# Patient Record
Sex: Female | Born: 1960
Health system: Southern US, Community
[De-identification: ages and names within clinical notes are randomized; demographics above are authoritative.]

## PROBLEM LIST (undated history)

## (undated) DIAGNOSIS — E559 Vitamin D deficiency, unspecified: Secondary | ICD-10-CM

## (undated) DIAGNOSIS — H33319 Horseshoe tear of retina without detachment, unspecified eye: Secondary | ICD-10-CM

## (undated) DIAGNOSIS — R002 Palpitations: Secondary | ICD-10-CM

## (undated) DIAGNOSIS — L709 Acne, unspecified: Secondary | ICD-10-CM

## (undated) DIAGNOSIS — R0602 Shortness of breath: Secondary | ICD-10-CM

## (undated) DIAGNOSIS — M7550 Bursitis of unspecified shoulder: Secondary | ICD-10-CM

## (undated) DIAGNOSIS — I499 Cardiac arrhythmia, unspecified: Secondary | ICD-10-CM

## (undated) DIAGNOSIS — J329 Chronic sinusitis, unspecified: Secondary | ICD-10-CM

## (undated) DIAGNOSIS — M2550 Pain in unspecified joint: Secondary | ICD-10-CM

## (undated) DIAGNOSIS — K219 Gastro-esophageal reflux disease without esophagitis: Secondary | ICD-10-CM

## (undated) HISTORY — PX: ABDOMINAL SURGERY: SHX537

## (undated) HISTORY — DX: Pain in unspecified joint: M25.50

## (undated) HISTORY — DX: Shortness of breath: R06.02

## (undated) HISTORY — PX: DILATION AND CURETTAGE OF UTERUS: SHX78

## (undated) HISTORY — DX: Palpitations: R00.2

## (undated) HISTORY — DX: Gastro-esophageal reflux disease without esophagitis: K21.9

## (undated) HISTORY — PX: TONSILLECTOMY: SUR1361

## (undated) HISTORY — DX: Vitamin D deficiency, unspecified: E55.9

## (undated) HISTORY — PX: ABDOMINAL SACROCOLPOPEXY: SHX1114

## (undated) HISTORY — PX: ABDOMINAL HYSTERECTOMY: SHX81

---

## 1991-10-30 DIAGNOSIS — E061 Subacute thyroiditis: Secondary | ICD-10-CM

## 1991-10-30 HISTORY — DX: Subacute thyroiditis: E06.1

## 1999-01-16 ENCOUNTER — Ambulatory Visit (HOSPITAL_COMMUNITY): Admission: RE | Admit: 1999-01-16 | Discharge: 1999-01-16 | Payer: Self-pay | Admitting: Gynecology

## 1999-01-16 ENCOUNTER — Encounter: Payer: Self-pay | Admitting: Gynecology

## 1999-04-27 ENCOUNTER — Encounter (INDEPENDENT_AMBULATORY_CARE_PROVIDER_SITE_OTHER): Payer: Self-pay | Admitting: Specialist

## 1999-04-27 ENCOUNTER — Other Ambulatory Visit: Admission: RE | Admit: 1999-04-27 | Discharge: 1999-04-27 | Payer: Self-pay | Admitting: Gynecology

## 1999-12-25 ENCOUNTER — Other Ambulatory Visit: Admission: RE | Admit: 1999-12-25 | Discharge: 1999-12-25 | Payer: Self-pay | Admitting: Gynecology

## 2000-12-16 ENCOUNTER — Other Ambulatory Visit: Admission: RE | Admit: 2000-12-16 | Discharge: 2000-12-16 | Payer: Self-pay | Admitting: Gynecology

## 2002-01-07 ENCOUNTER — Other Ambulatory Visit: Admission: RE | Admit: 2002-01-07 | Discharge: 2002-01-07 | Payer: Self-pay | Admitting: Gynecology

## 2002-02-04 ENCOUNTER — Encounter: Payer: Self-pay | Admitting: Gynecology

## 2002-02-04 ENCOUNTER — Ambulatory Visit (HOSPITAL_COMMUNITY): Admission: RE | Admit: 2002-02-04 | Discharge: 2002-02-04 | Payer: Self-pay | Admitting: Gynecology

## 2002-05-20 ENCOUNTER — Encounter: Payer: Self-pay | Admitting: Gynecology

## 2002-05-20 ENCOUNTER — Encounter: Admission: RE | Admit: 2002-05-20 | Discharge: 2002-05-20 | Payer: Self-pay | Admitting: Gynecology

## 2003-01-12 ENCOUNTER — Other Ambulatory Visit: Admission: RE | Admit: 2003-01-12 | Discharge: 2003-01-12 | Payer: Self-pay | Admitting: Gynecology

## 2003-05-26 ENCOUNTER — Encounter: Payer: Self-pay | Admitting: Gynecology

## 2003-05-26 ENCOUNTER — Encounter: Admission: RE | Admit: 2003-05-26 | Discharge: 2003-05-26 | Payer: Self-pay | Admitting: Gynecology

## 2004-02-15 ENCOUNTER — Other Ambulatory Visit: Admission: RE | Admit: 2004-02-15 | Discharge: 2004-02-15 | Payer: Self-pay | Admitting: Gynecology

## 2004-06-08 ENCOUNTER — Encounter: Admission: RE | Admit: 2004-06-08 | Discharge: 2004-06-08 | Payer: Self-pay | Admitting: Gynecology

## 2005-02-21 ENCOUNTER — Other Ambulatory Visit: Admission: RE | Admit: 2005-02-21 | Discharge: 2005-02-21 | Payer: Self-pay | Admitting: Gynecology

## 2005-05-14 ENCOUNTER — Ambulatory Visit: Payer: Self-pay | Admitting: Oncology

## 2005-06-11 ENCOUNTER — Encounter: Admission: RE | Admit: 2005-06-11 | Discharge: 2005-06-11 | Payer: Self-pay | Admitting: Gynecology

## 2005-06-21 ENCOUNTER — Encounter: Admission: RE | Admit: 2005-06-21 | Discharge: 2005-06-21 | Payer: Self-pay | Admitting: Gynecology

## 2006-03-04 ENCOUNTER — Other Ambulatory Visit: Admission: RE | Admit: 2006-03-04 | Discharge: 2006-03-04 | Payer: Self-pay | Admitting: Gynecology

## 2006-05-25 ENCOUNTER — Emergency Department (HOSPITAL_COMMUNITY): Admission: EM | Admit: 2006-05-25 | Discharge: 2006-05-25 | Payer: Self-pay | Admitting: Emergency Medicine

## 2006-06-17 ENCOUNTER — Encounter: Admission: RE | Admit: 2006-06-17 | Discharge: 2006-06-17 | Payer: Self-pay | Admitting: Gynecology

## 2006-08-21 ENCOUNTER — Ambulatory Visit: Payer: Self-pay | Admitting: Cardiology

## 2006-09-18 ENCOUNTER — Ambulatory Visit: Payer: Self-pay | Admitting: Cardiology

## 2006-09-18 ENCOUNTER — Encounter: Payer: Self-pay | Admitting: Cardiology

## 2006-09-18 ENCOUNTER — Ambulatory Visit: Payer: Self-pay

## 2007-03-12 ENCOUNTER — Other Ambulatory Visit: Admission: RE | Admit: 2007-03-12 | Discharge: 2007-03-12 | Payer: Self-pay | Admitting: Gynecology

## 2007-06-27 ENCOUNTER — Encounter: Admission: RE | Admit: 2007-06-27 | Discharge: 2007-06-27 | Payer: Self-pay | Admitting: Gynecology

## 2008-03-24 ENCOUNTER — Other Ambulatory Visit: Admission: RE | Admit: 2008-03-24 | Discharge: 2008-03-24 | Payer: Self-pay | Admitting: Gynecology

## 2008-07-02 ENCOUNTER — Encounter: Admission: RE | Admit: 2008-07-02 | Discharge: 2008-07-02 | Payer: Self-pay | Admitting: Gynecology

## 2008-10-15 ENCOUNTER — Encounter: Admission: RE | Admit: 2008-10-15 | Discharge: 2008-10-15 | Payer: Self-pay | Admitting: Gynecology

## 2008-11-06 ENCOUNTER — Ambulatory Visit: Payer: Self-pay | Admitting: Family Medicine

## 2008-11-06 DIAGNOSIS — J0101 Acute recurrent maxillary sinusitis: Secondary | ICD-10-CM | POA: Insufficient documentation

## 2008-11-06 DIAGNOSIS — J0100 Acute maxillary sinusitis, unspecified: Secondary | ICD-10-CM | POA: Insufficient documentation

## 2008-11-06 DIAGNOSIS — J069 Acute upper respiratory infection, unspecified: Secondary | ICD-10-CM | POA: Insufficient documentation

## 2009-04-10 ENCOUNTER — Ambulatory Visit: Payer: Self-pay | Admitting: Family Medicine

## 2009-04-10 DIAGNOSIS — S93409A Sprain of unspecified ligament of unspecified ankle, initial encounter: Secondary | ICD-10-CM | POA: Insufficient documentation

## 2009-07-13 ENCOUNTER — Encounter: Admission: RE | Admit: 2009-07-13 | Discharge: 2009-07-13 | Payer: Self-pay | Admitting: Gynecology

## 2009-09-13 ENCOUNTER — Encounter (INDEPENDENT_AMBULATORY_CARE_PROVIDER_SITE_OTHER): Payer: Self-pay | Admitting: Obstetrics and Gynecology

## 2009-09-13 ENCOUNTER — Ambulatory Visit (HOSPITAL_COMMUNITY): Admission: RE | Admit: 2009-09-13 | Discharge: 2009-09-13 | Payer: Self-pay | Admitting: Obstetrics and Gynecology

## 2010-03-14 ENCOUNTER — Ambulatory Visit (HOSPITAL_COMMUNITY): Admission: RE | Admit: 2010-03-14 | Discharge: 2010-03-15 | Payer: Self-pay | Admitting: Obstetrics and Gynecology

## 2010-03-14 ENCOUNTER — Encounter (INDEPENDENT_AMBULATORY_CARE_PROVIDER_SITE_OTHER): Payer: Self-pay | Admitting: Obstetrics and Gynecology

## 2010-03-14 DIAGNOSIS — Z9079 Acquired absence of other genital organ(s): Secondary | ICD-10-CM | POA: Insufficient documentation

## 2010-04-10 ENCOUNTER — Ambulatory Visit (HOSPITAL_COMMUNITY): Admission: RE | Admit: 2010-04-10 | Discharge: 2010-04-10 | Payer: Self-pay | Admitting: Obstetrics and Gynecology

## 2010-04-21 ENCOUNTER — Ambulatory Visit (HOSPITAL_COMMUNITY): Admission: RE | Admit: 2010-04-21 | Discharge: 2010-04-21 | Payer: Self-pay | Admitting: Orthopedic Surgery

## 2010-07-19 ENCOUNTER — Encounter: Admission: RE | Admit: 2010-07-19 | Discharge: 2010-07-19 | Payer: Self-pay | Admitting: Gynecology

## 2010-10-02 ENCOUNTER — Encounter: Admission: RE | Admit: 2010-10-02 | Discharge: 2010-10-02 | Payer: Self-pay | Admitting: Gynecology

## 2010-11-19 ENCOUNTER — Encounter: Payer: Self-pay | Admitting: Orthopedic Surgery

## 2011-01-15 LAB — CBC
Hemoglobin: 11.1 g/dL — ABNORMAL LOW (ref 12.0–15.0)
MCV: 95.8 fL (ref 78.0–100.0)
Platelets: 194 10*3/uL (ref 150–400)
RBC: 3.35 MIL/uL — ABNORMAL LOW (ref 3.87–5.11)
WBC: 12.7 10*3/uL — ABNORMAL HIGH (ref 4.0–10.5)

## 2011-01-16 LAB — CBC
Hemoglobin: 13.4 g/dL (ref 12.0–15.0)
WBC: 6.2 10*3/uL (ref 4.0–10.5)

## 2011-01-16 LAB — DIFFERENTIAL
Basophils Relative: 1 % (ref 0–1)
Eosinophils Relative: 2 % (ref 0–5)
Monocytes Absolute: 0.6 10*3/uL (ref 0.1–1.0)
Monocytes Relative: 10 % (ref 3–12)
Neutro Abs: 3.6 10*3/uL (ref 1.7–7.7)
Neutrophils Relative %: 59 % (ref 43–77)

## 2011-01-16 LAB — COMPREHENSIVE METABOLIC PANEL
ALT: 15 U/L (ref 0–35)
AST: 18 U/L (ref 0–37)
Calcium: 9.1 mg/dL (ref 8.4–10.5)
Creatinine, Ser: 0.7 mg/dL (ref 0.4–1.2)
Glucose, Bld: 86 mg/dL (ref 70–99)
Sodium: 138 mEq/L (ref 135–145)
Total Bilirubin: 0.6 mg/dL (ref 0.3–1.2)
Total Protein: 7.1 g/dL (ref 6.0–8.3)

## 2011-01-31 LAB — COMPREHENSIVE METABOLIC PANEL
ALT: 15 U/L (ref 0–35)
Calcium: 9.2 mg/dL (ref 8.4–10.5)
Chloride: 106 mEq/L (ref 96–112)
GFR calc Af Amer: >60 mL/min (ref >60)
Total Bilirubin: 0.6 mg/dL (ref 0.3–1.2)
Total Protein: 7.4 g/dL (ref 6.0–8.3)

## 2011-01-31 LAB — CBC
Hemoglobin: 13.6 g/dL (ref 12.0–15.0)
MCHC: 33.8 g/dL (ref 30.0–36.0)
MCV: 95.8 fL (ref 78.0–100.0)
Platelets: 262 10*3/uL (ref 150–400)
RBC: 4.2 MIL/uL (ref 3.87–5.11)
WBC: 5.4 10*3/uL (ref 4.0–10.5)

## 2011-03-16 NOTE — Procedures (Signed)
[facility]                                EXERCISE TREADMILL   Danielle Braun, Danielle Braun                       MRN:          [id number]  DATE:09/18/2006                            DOB:          1961/04/04    PRIMARY CARE PHYSICIAN:  Sid Falcon, M.D.   PROCEDURE:  Exercise treadmill test.   INDICATION:  Evaluate patient with tachycardia and palpitations.   DESCRIPTION OF PROCEDURE:  The patient exercised using standard Bruce  protocol. She was able to exercise for 9 minutes. This was to a heart rate  of 184 which was 104% of predicted. She achieved 10.1 METS. She had an  appropriately blood pressure response of 187/88 peak at exercise. She had no  chest pain. There was no ectopy during this procedure. There were no  ischemic ST, T wave changes. She had normal heart rate recovery.   CONCLUSION:  Negative adequate exercise treadmill test. She did not have any  dysrhythmias with this. Preliminary echo demonstrates EF to be 60% with no  abnormalities. Given this she is at low risk for sustained dangerous  ventricular arrhythmias. No further cardiovascular testing is suggested. We  did discuss and regimen based on the above testing.   FOLLOW UP:  She can come back to this clinic as needed.     Rollene Rotunda, MD, [facility]  Electronically Signed    JH/MedQ  DD: 09/18/2006  DT: 09/18/2006  Job #: [id number]   cc:   Sid Falcon, M.D.

## 2011-03-16 NOTE — Assessment & Plan Note (Signed)
[facility]                              CARDIOLOGY OFFICE NOTE   Danielle, Braun                       MRN:          [id number]  DATE:08/21/2006                            DOB:          [birth year]    REASON FOR PRESENTATION:  Evaluate the patient for tachycardia.   HISTORY OF PRESENT ILLNESS:  The patient is a lovely 50 year old white  female who had a history of premature ventricular contractions documented 14  years ago.  At that time she was found to have subacute thyroiditis.  She  also became pregnant shortly thereafter.  She did have an echocardiogram  that was apparently normal, but needed no further workup.  In March of this  year she had an episode of near-syncope while at work, seated.  She did not  feel any tachy arrhythmias at that time.  However, for about 10 seconds, she  felt things going gray and thought that, if it continued, she would have  passed out.  However, it stopped.  She has not had any further episodes of  that.  However, since August, perhaps 1 time per week, particularly while  bending or squatting she feels her heart skipping.  She did notice this  during the examination as well.  She has otherwise been very active.  She  exercises routinely.  She does not have any chest discomfort, neck  discomfort, arm discomfort, activity-induced nausea or vomiting, excessive  diaphoresis.  She has had no palpitations, pre-syncope, or syncope.  She  denies any PND or orthopnea.   She did wear a Holter monitor.  This demonstrated sinus rhythm or sinus  tachycardia.  She had occasional premature atrial contractions.  She did  have a non-sustained tachy arhythmia of 15 beats.  This was a different  morphology than the PACs.  It was not particularly wide.  However, it may  have been ventricular tachycardia.  She actually did not have any symptoms  related to this.   PAST MEDICAL HISTORY:  Subacute thyroiditis.   PAST SURGICAL  HISTORY:  Tonsillectomy.   ALLERGIES:  NAPROSYN.   MEDICATIONS:  1. Provera 5 mg q. day.  2. Multivitamin.  3. Calcium.  4. Flonase.  5. Benadryl.  6. Topical agents for acne.   SOCIAL HISTORY:  The patient is a Consulting civil engineer.  She exercises.  She is married.  She has 2 children.   FAMILY HISTORY:  Contributory for her maternal grandfather dying suddenly.  However, there is no other history consistent with sudden cardiac death,  cardiomyopathy, syncope, arrhythmias, or early coronary disease.   REVIEW OF SYSTEMS:  Positive as stated in the HPI and positive for headaches  and occasional swelling in her legs, left leg phlebitis in 1999, rhinitis.  Negative for other systems.   PHYSICAL EXAMINATION:  The patient is in no distress.  Blood pressure 113/70, heart rate 84 and regular.  Weight 155 pounds.  Body  mass index 24.  HEENT:  Eyelids unremarkable.  Pupils equal, round, and reactive to light.  Fundi not visualized.  Oral mucosa unremarkable.  NECK:  No jugular venous distension.  Wave form within normal limits.  Carotid upstroke brisk, symmetric.  No bruits, thyromegaly.  LYMPHATICS:  No cervical, axillary, or inguinal adenopathy.  LUNGS:  Clear to auscultation bilaterally.  BACK:  No costovertebral angle tenderness.  CHEST:  Unremarkable.  HEART:  PMI not displaced or sustained.  S1 and S2 within normal limits.  No  S3, no S4.  No murmurs.  No clicks.  No rubs.  ABDOMEN:  Flat.  Positive bowel sounds.  Normal in frequency and pitch.  No  bruits, rebound, or guarding.  No midline pulsatile masses.  No  hepatomegaly.  No splenomegaly.  SKIN:  No rashes.  No nodules.  EXTREMITIES:  With 2+ pulses throughout.  No edema.  No cyanosis, no  clubbing.  NEURO:  Oriented to person, place, and time.  Cranial nerves 2-12 grossly  intact.  Motor grossly intact.   EKG:  Sinus rhythm, rate 84, axis within normal limits, intervals within  normal limits, no acute ST-T wave  changes.   ASSESSMENT AND PLAN:  1. Palpitations.  The patient has palpitations that are her premature      atrial contractions.  These are her most symptomatic complaints.  At      this point, we talked about these and we will manage these      conservatively for now.  She is going to avoid all caffeine and see if      this improves.  If not, we might consider calcium channel blocker.  2. Nonsustained tachy arrhythmia.  This may have been ventricular      tachycardia.  However, she did not have any symptoms this time related      to this.  I would suggest that this may have been the episode that she      felt in March, but have no proof of this.  For now I am going to start      with an echocardiogram.  I will also bring her back for an exercise      treadmill test.    ______________________________  Rollene Rotunda, MD, [facility]    JH/MedQ  DD: 08/21/2006  DT: 08/22/2006  Job #: [id number]   cc:   Dr Sid Falcon

## 2011-06-26 ENCOUNTER — Other Ambulatory Visit: Payer: Self-pay | Admitting: Gynecology

## 2011-06-26 DIAGNOSIS — Z1231 Encounter for screening mammogram for malignant neoplasm of breast: Secondary | ICD-10-CM

## 2011-07-27 ENCOUNTER — Ambulatory Visit
Admission: RE | Admit: 2011-07-27 | Discharge: 2011-07-27 | Disposition: A | Discharge status: Home / Self Care | Payer: 59 | Source: Ambulatory Visit | Attending: Gynecology | Admitting: Gynecology

## 2011-07-27 DIAGNOSIS — Z1231 Encounter for screening mammogram for malignant neoplasm of breast: Secondary | ICD-10-CM

## 2012-01-09 ENCOUNTER — Encounter: Payer: Self-pay | Admitting: *Deleted

## 2012-01-09 ENCOUNTER — Emergency Department
Admission: EM | Admit: 2012-01-09 | Discharge: 2012-01-09 | Disposition: A | Discharge status: Home Health | Payer: 59 | Source: Home / Self Care | Attending: Emergency Medicine | Admitting: Emergency Medicine

## 2012-01-09 ENCOUNTER — Emergency Department: Admit: 2012-01-09 | Discharge: 2012-01-09 | Disposition: A | Discharge status: Home / Self Care | Payer: 59

## 2012-01-09 DIAGNOSIS — M542 Cervicalgia: Secondary | ICD-10-CM

## 2012-01-09 HISTORY — DX: Acne, unspecified: L70.9

## 2012-01-09 MED ORDER — CYCLOBENZAPRINE HCL 10 MG PO TABS: 10.0000 mg | ORAL_TABLET | Freq: Two times a day (BID) | ORAL | Status: AC | PRN

## 2012-01-09 MED ORDER — MELOXICAM 7.5 MG PO TABS: 7.5000 mg | ORAL_TABLET | Freq: Two times a day (BID) | ORAL | Status: AC | PRN
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
## 2012-06-26 ENCOUNTER — Other Ambulatory Visit: Payer: Self-pay | Admitting: Gynecology

## 2012-06-26 DIAGNOSIS — Z1231 Encounter for screening mammogram for malignant neoplasm of breast: Secondary | ICD-10-CM

## 2012-07-29 ENCOUNTER — Ambulatory Visit
Admission: RE | Admit: 2012-07-29 | Discharge: 2012-07-29 | Disposition: A | Discharge status: Home / Self Care | Payer: 59 | Source: Ambulatory Visit | Attending: Gynecology | Admitting: Gynecology

## 2012-07-29 DIAGNOSIS — Z1231 Encounter for screening mammogram for malignant neoplasm of breast: Secondary | ICD-10-CM

## 2012-08-20 ENCOUNTER — Other Ambulatory Visit: Payer: Self-pay | Admitting: Dermatology

## 2012-11-18 ENCOUNTER — Other Ambulatory Visit: Payer: Self-pay | Admitting: Gastroenterology

## 2013-05-02 ENCOUNTER — Emergency Department
Admission: EM | Admit: 2013-05-02 | Discharge: 2013-05-02 | Disposition: A | Discharge status: Home / Self Care | Payer: 59 | Source: Home / Self Care | Attending: Family Medicine | Admitting: Family Medicine

## 2013-05-02 DIAGNOSIS — B029 Zoster without complications: Secondary | ICD-10-CM

## 2013-05-02 MED ORDER — VALACYCLOVIR HCL 1 G PO TABS: 1000.0000 mg | ORAL_TABLET | Freq: Three times a day (TID) | ORAL | Status: DC

## 2013-05-02 NOTE — ED Notes (Signed)
Danielle Braun complains of a rash on her face for a couple of days. She also complains of pain and rash on her upper back for 1 day. The pain is a 7/10 starting off as sharp and ending up a burning pain. She has tried Aleve and Ibuprofen without relief.

## 2013-05-02 NOTE — ED Provider Notes (Signed)
History    CSN: [id number] Arrival date & time 05/02/13  0927  First MD Initiated Contact with Patient 05/02/13 1009     Chief Complaint  Patient presents with  . Rash    x couple days  . Back Pain    yesterday      HPI Comments: Patient noticed a slight rash on her right upper arm about 5 days ago, and then some on her right cheek about 2 to 3 days ago with a burning sensation.  Yesterday she developed a sharp pain in her right upper back radiating to her right shoulder.  She feels well otherwise.  No known insect bites.  Patient is a 52 y.o. female presenting with rash. The history is provided by the patient.  Rash Pain location: right upper back. Pain quality: burning   Pain radiates to:  R shoulder Pain severity:  Mild Onset quality:  Gradual Duration:  5 days Timing:  Constant Progression:  Worsening Chronicity:  New Relieved by:  Nothing Worsened by:  Nothing tried Ineffective treatments:  NSAIDs Associated symptoms: no anorexia, no chest pain, no chills, no cough, no fatigue, no fever and no nausea    Past Medical History  Diagnosis Date  . Acne   . Endometriosis    Past Surgical History  Procedure Laterality Date  . Tonsillectomy    . Abdominal hysterectomy    . Abdominal surgery      csection   Family History  Problem Relation Age of Onset  . Hyperlipidemia Mother   . Cancer Father     bladder  . Diabetes Brother    History  Substance Use Topics  . Smoking status: Former Games developer  . Smokeless tobacco: Not on file  . Alcohol Use: Yes   OB History   Grav Para Term Preterm Abortions TAB SAB Ect Mult Living                 Review of Systems  Constitutional: Negative for fever, chills and fatigue.  Respiratory: Negative for cough.   Cardiovascular: Negative for chest pain.  Gastrointestinal: Negative for nausea and anorexia.  Skin: Positive for rash.  All other systems reviewed and are negative.    Allergies  Review of patient's allergies  indicates no known allergies.  Home Medications   Current Outpatient Rx  Name  Route  Sig  Dispense  Refill  . calcium carbonate (OS-CAL) 600 MG TABS   Oral   Take 600 mg by mouth 2 (two) times daily with a meal.         . ranitidine (ZANTAC) 150 MG capsule   Oral   Take 150 mg by mouth 2 (two) times daily.         . fluticasone (FLONASE) 50 MCG/ACT nasal spray   Nasal   Place 2 sprays into the nose daily.         . minocycline (MINOCIN,DYNACIN) 100 MG capsule   Oral   Take 100 mg by mouth 2 (two) times daily.         . valACYclovir (VALTREX) 1000 MG tablet   Oral   Take 1 tablet (1,000 mg total) by mouth 3 (three) times daily.   21 tablet   0    BP 99/69  Pulse 75  Temp(Src) 98.2 F (36.8 C) (Oral)  Ht 5\' 6"  (1.676 m)  Wt 170 lb (77.111 kg)  BMI 27.45 kg/m2  SpO2 99% Physical Exam  Nursing note and vitals reviewed. Constitutional: She  is oriented to person, place, and time. She appears well-developed and well-nourished. No distress.  HENT:  Head: Normocephalic and atraumatic.  Nose: Nose normal.  Mouth/Throat: Oropharynx is clear and moist.  Eyes: Conjunctivae and EOM are normal. Pupils are equal, round, and reactive to light. Right eye exhibits no discharge. Left eye exhibits no discharge.  Neck: Neck supple.  Cardiovascular: Normal heart sounds.   Pulmonary/Chest: Breath sounds normal.  Abdominal: There is no tenderness.  Lymphadenopathy:    She has no cervical adenopathy.  Neurological: She is alert and oriented to person, place, and time.  Skin: Skin is warm and dry. Rash noted. Rash is pustular.     In distribution indicated, there are several individual pustular lesions.  No lesions on nose.    ED Course  Procedures  none    1. Herpes zoster     MDM  Begin Valtrex If lesions affect tip of nose, recommend evaluation by ophthalmologist May take Tylenol or Ibuprofen for pain. Followup with Family Doctor if not improved in one week.    Lattie Haw, MD 05/07/13 [phone]

## 2013-05-07 ENCOUNTER — Telehealth: Payer: Self-pay | Admitting: Emergency Medicine

## 2013-07-03 ENCOUNTER — Other Ambulatory Visit: Payer: Self-pay

## 2013-07-03 DIAGNOSIS — Z1231 Encounter for screening mammogram for malignant neoplasm of breast: Secondary | ICD-10-CM

## 2013-08-03 ENCOUNTER — Ambulatory Visit
Admission: RE | Admit: 2013-08-03 | Discharge: 2013-08-03 | Disposition: A | Discharge status: Home / Self Care | Payer: 59 | Source: Ambulatory Visit

## 2013-08-03 DIAGNOSIS — Z1231 Encounter for screening mammogram for malignant neoplasm of breast: Secondary | ICD-10-CM

## 2013-08-20 ENCOUNTER — Other Ambulatory Visit: Payer: Self-pay | Admitting: Dermatology

## 2014-07-02 ENCOUNTER — Other Ambulatory Visit: Payer: Self-pay

## 2014-07-02 DIAGNOSIS — Z1231 Encounter for screening mammogram for malignant neoplasm of breast: Secondary | ICD-10-CM

## 2014-08-04 ENCOUNTER — Ambulatory Visit
Admission: RE | Admit: 2014-08-04 | Discharge: 2014-08-04 | Disposition: A | Discharge status: Home / Self Care | Payer: 59 | Source: Ambulatory Visit

## 2014-08-04 DIAGNOSIS — Z1231 Encounter for screening mammogram for malignant neoplasm of breast: Secondary | ICD-10-CM

## 2014-10-13 ENCOUNTER — Other Ambulatory Visit: Payer: Self-pay | Admitting: Gynecology

## 2014-10-14 LAB — CYTOLOGY - PAP

## 2014-11-03 ENCOUNTER — Other Ambulatory Visit: Payer: Self-pay | Admitting: Dermatology

## 2015-04-16 ENCOUNTER — Emergency Department (INDEPENDENT_AMBULATORY_CARE_PROVIDER_SITE_OTHER)
Admission: EM | Admit: 2015-04-16 | Discharge: 2015-04-16 | Disposition: A | Discharge status: Home / Self Care | Payer: 59 | Source: Home / Self Care | Attending: Emergency Medicine | Admitting: Emergency Medicine

## 2015-04-16 ENCOUNTER — Emergency Department (INDEPENDENT_AMBULATORY_CARE_PROVIDER_SITE_OTHER): Payer: 59

## 2015-04-16 DIAGNOSIS — M7732 Calcaneal spur, left foot: Secondary | ICD-10-CM

## 2015-04-16 DIAGNOSIS — S9032XA Contusion of left foot, initial encounter: Secondary | ICD-10-CM | POA: Diagnosis not present

## 2015-04-16 DIAGNOSIS — S90122A Contusion of left lesser toe(s) without damage to nail, initial encounter: Secondary | ICD-10-CM

## 2015-04-16 NOTE — ED Notes (Signed)
Patient complains of left foot pain. Patient states that she was on vacation in [place] approx 3 weeks ago and fell, although it was on the opposite side, she noticed left foot pain approx 2 weeks ago.

## 2015-04-16 NOTE — ED Provider Notes (Signed)
CSN: [id number]     Arrival date & time 04/16/15  5809 History   First MD Initiated Contact with Patient 04/16/15 [phone]     Chief Complaint  Patient presents with  . Foot Pain    left    HPI Patient complains of left foot pain. Patient states that she was on vacation in [place] approx 3 weeks ago and fell, although it was on the opposite side, she noticed left foot pain approx 2 weeks ago. The pain is moderate left anterior lateral foot, without radiation. Worse with weightbearing but she is able to walk on it. Tried ibuprofen that helped a little. Denies ankle pain. No numbness or weakness or paresthesias. Past Medical History  Diagnosis Date  . Acne   . Endometriosis    Past Surgical History  Procedure Laterality Date  . Tonsillectomy    . Abdominal hysterectomy    . Abdominal surgery      csection   Family History  Problem Relation Age of Onset  . Hyperlipidemia Mother   . Cancer Father     bladder  . Diabetes Brother    History  Substance Use Topics  . Smoking status: Former Research scientist (life sciences)  . Smokeless tobacco: Not on file  . Alcohol Use: Yes   OB History    No data available     Review of Systems Remainder of Review of Systems negative for acute change except as noted in the HPI.  Allergies  Review of patient's allergies indicates no known allergies.  Home Medications   Prior to Admission medications   Medication Sig Start Date End Date Taking? Authorizing Provider  calcium carbonate (OS-CAL) 600 MG TABS Take 600 mg by mouth 2 (two) times daily with a meal.   Yes Historical Provider, MD  fluticasone (FLONASE) 50 MCG/ACT nasal spray Place 2 sprays into the nose daily.   Yes Historical Provider, MD  ranitidine (ZANTAC) 150 MG capsule Take 150 mg by mouth 2 (two) times daily.   Yes Historical Provider, MD   BP 129/83 mmHg  Pulse 77  Temp(Src) 98.3 F (36.8 C) (Oral)  Ht 5\' 6"  (1.676 m)  Wt 171 lb (77.565 kg)  BMI 27.61 kg/m2  SpO2 97% Physical Exam    Constitutional: She is oriented to person, place, and time. She appears well-developed and well-nourished. No distress.  HENT:  Head: Normocephalic and atraumatic.  Eyes: Conjunctivae and EOM are normal. Pupils are equal, round, and reactive to light. No scleral icterus.  Neck: Normal range of motion.  Cardiovascular: Normal rate.   Pulmonary/Chest: Effort normal.  Abdominal: She exhibits no distension.  Musculoskeletal:       Left ankle: Normal.       Left foot: There is decreased range of motion, tenderness, bony tenderness and swelling. There is normal capillary refill, no deformity and no laceration.       Feet:  Neurological: She is alert and oriented to person, place, and time.  Skin: Skin is warm.  Psychiatric: She has a normal mood and affect.  Nursing note and vitals reviewed.   ED Course  Procedures (including critical care time) Labs Review Labs Reviewed - No data to display  Imaging Review Dg Foot Complete Left  04/16/2015   CLINICAL DATA:  Initial encounter for left foot pain. Symptoms are mostly at the fourth toe.  EXAM: LEFT FOOT - COMPLETE 3+ VIEW  COMPARISON:  04/10/2009  FINDINGS: Negative for a fracture or dislocation. Prominent plantar calcaneal spur. Soft tissues are unremarkable.  IMPRESSION: No acute bone abnormality to the left foot.  Calcaneal spur.   Electronically Signed   By: Markus Daft M.D.   On: 04/16/2015 10:22     MDM   1. Contusion of left foot including toes, initial encounter    Treatment options discussed, as well as risks, benefits, alternatives. Patient voiced understanding and agreement with the following plans:  Ace bandage Postop shoe Ibuprofen OTC. She declined prescription for Mobic Other advice and symptomatic care discussed Follow-up with your primary care doctor or orthopedist in 5-7 days if not improving, or sooner if symptoms become worse. Precautions discussed. Red flags discussed. Questions invited and answered. Patient  voiced understanding and agreement.     Jacqulyn Cane, MD 04/16/15 1145

## 2015-07-07 ENCOUNTER — Other Ambulatory Visit: Payer: Self-pay

## 2015-07-07 DIAGNOSIS — Z1231 Encounter for screening mammogram for malignant neoplasm of breast: Secondary | ICD-10-CM

## 2015-08-10 ENCOUNTER — Ambulatory Visit
Admission: RE | Admit: 2015-08-10 | Discharge: 2015-08-10 | Disposition: A | Discharge status: Home / Self Care | Payer: 59 | Source: Ambulatory Visit

## 2015-08-10 DIAGNOSIS — Z1231 Encounter for screening mammogram for malignant neoplasm of breast: Secondary | ICD-10-CM

## 2015-12-14 MED FILL — raNITIdine HCL 150 MG TABS: 150 | 90 days supply | Qty: 180 | Fill #1

## 2015-12-22 ENCOUNTER — Encounter: Payer: Self-pay | Admitting: Skilled Nursing Facility1

## 2015-12-22 ENCOUNTER — Encounter: Payer: 59 | Attending: Family Medicine | Admitting: Skilled Nursing Facility1

## 2015-12-22 VITALS — Ht 66.0 in | Wt 179.0 lb

## 2015-12-22 DIAGNOSIS — E663 Overweight: Secondary | ICD-10-CM

## 2015-12-22 DIAGNOSIS — Z713 Dietary counseling and surveillance: Secondary | ICD-10-CM | POA: Insufficient documentation

## 2015-12-22 NOTE — Progress Notes (Signed)
  Medical Nutrition Therapy:  Appt start time: 1200 end time:  1230.   Assessment:  Primary concerns today: [employer] employee. Pt states she wants to get her badge. Pt states 3 weeks ago her aunt passed away. Pt states she started having hot flashes in November and states she is menopausal. Pt states her sleep is good. Pt states her energy level is good. Pt was very upbeat and pleasant.   Preferred Learning Style:   No preference indicated   Learning Readiness:   Change in progress  MEDICATIONS: See List   DIETARY INTAKE:  Usual eating pattern includes 3 meals and 2-3 snacks per day.  Everyday foods include none stated.  Avoided foods include none stated.    24-hr recall:  B ( AM): oatmeal and fruit-----1 egg, slice of toast with fruit jam and fruit Snk ( AM): crackers---almonds with half a granola bar L ( PM): salad----entree with 2 vegetables Snk ( PM): apple D ( PM): protein and vegetables with rice Snk ( PM): cereal----chocolate  Beverages: milk, coffee, water, sweet tea with very little sugar  Usual physical activity: cardio 20-30 minutes 7 days a week   Estimated energy needs: 1600 calories 180 g carbohydrates 120 g protein 44 g fat  Progress Towards Goal(s):  In progress.   Nutritional Diagnosis:  NB-1.1 Food and nutrition-related knowledge deficit As related to no prior nutrition education from a nutrition professional.  As evidenced by pt report.    Intervention:  Nutrition counseling for overweight. Dietitian praised the pt for her diet and physical activity routine. Dietitian educated the pt on menopause and weight. Goals:  -Stop eating the cereal at night -Drink more water -Try some different snack options  Teaching Method Utilized:  Visual Auditory  Handouts given during visit include:  Snack sheet  Barriers to learning/adherence to lifestyle change: none identified  Demonstrated degree of understanding via:  Teach Back    Monitoring/Evaluation:  Dietary intake, exercise, and body weight prn.

## 2016-01-06 DIAGNOSIS — D2262 Melanocytic nevi of left upper limb, including shoulder: Secondary | ICD-10-CM | POA: Diagnosis not present

## 2016-01-06 DIAGNOSIS — L738 Other specified follicular disorders: Secondary | ICD-10-CM | POA: Diagnosis not present

## 2016-01-06 DIAGNOSIS — D2261 Melanocytic nevi of right upper limb, including shoulder: Secondary | ICD-10-CM | POA: Diagnosis not present

## 2016-01-06 DIAGNOSIS — D2272 Melanocytic nevi of left lower limb, including hip: Secondary | ICD-10-CM | POA: Diagnosis not present

## 2016-01-06 DIAGNOSIS — L700 Acne vulgaris: Secondary | ICD-10-CM | POA: Diagnosis not present

## 2016-01-06 DIAGNOSIS — L570 Actinic keratosis: Secondary | ICD-10-CM | POA: Diagnosis not present

## 2016-01-06 DIAGNOSIS — D2271 Melanocytic nevi of right lower limb, including hip: Secondary | ICD-10-CM | POA: Diagnosis not present

## 2016-01-06 DIAGNOSIS — L821 Other seborrheic keratosis: Secondary | ICD-10-CM | POA: Diagnosis not present

## 2016-01-06 DIAGNOSIS — D2239 Melanocytic nevi of other parts of face: Secondary | ICD-10-CM | POA: Diagnosis not present

## 2016-01-23 MED FILL — FLUTICASONE PROP 50 MCG SPR: 50 | 90 days supply | Qty: 48 | Fill #0

## 2016-02-03 DIAGNOSIS — J0100 Acute maxillary sinusitis, unspecified: Secondary | ICD-10-CM | POA: Diagnosis not present

## 2016-02-06 DIAGNOSIS — E785 Hyperlipidemia, unspecified: Secondary | ICD-10-CM | POA: Insufficient documentation

## 2016-02-06 DIAGNOSIS — H5213 Myopia, bilateral: Secondary | ICD-10-CM | POA: Insufficient documentation

## 2016-02-06 DIAGNOSIS — K219 Gastro-esophageal reflux disease without esophagitis: Secondary | ICD-10-CM | POA: Insufficient documentation

## 2016-02-06 DIAGNOSIS — J309 Allergic rhinitis, unspecified: Secondary | ICD-10-CM | POA: Insufficient documentation

## 2016-02-06 DIAGNOSIS — I491 Atrial premature depolarization: Secondary | ICD-10-CM | POA: Insufficient documentation

## 2016-02-06 DIAGNOSIS — H43812 Vitreous degeneration, left eye: Secondary | ICD-10-CM | POA: Insufficient documentation

## 2016-02-06 DIAGNOSIS — H16223 Keratoconjunctivitis sicca, not specified as Sjogren's, bilateral: Secondary | ICD-10-CM | POA: Insufficient documentation

## 2016-02-09 MED FILL — GAVILYTE-N SOLUTION: 420 | 2 days supply | Qty: 4000 | Fill #0

## 2016-02-20 DIAGNOSIS — Z789 Other specified health status: Secondary | ICD-10-CM | POA: Insufficient documentation

## 2016-02-24 DIAGNOSIS — Z86010 Personal history of colonic polyps: Secondary | ICD-10-CM | POA: Diagnosis not present

## 2016-03-07 MED FILL — raNITIdine HCL 150 MG TABS: 150 | 90 days supply | Qty: 180 | Fill #2

## 2016-05-02 MED FILL — FLUTICASONE PROP 50 MCG SPR: 50 | 90 days supply | Qty: 48 | Fill #1

## 2016-05-22 DIAGNOSIS — M25511 Pain in right shoulder: Secondary | ICD-10-CM | POA: Diagnosis not present

## 2016-05-22 MED FILL — CYCLOBENZAPRINE 7.5 MG TAB: 7.5 | 10 days supply | Qty: 30 | Fill #0

## 2016-05-22 MED FILL — MELOXICAM 15 MG TABLET: 15 | 30 days supply | Qty: 30 | Fill #0

## 2016-06-26 DIAGNOSIS — M25511 Pain in right shoulder: Secondary | ICD-10-CM | POA: Diagnosis not present

## 2016-06-26 DIAGNOSIS — M7581 Other shoulder lesions, right shoulder: Secondary | ICD-10-CM | POA: Diagnosis not present

## 2016-07-05 ENCOUNTER — Ambulatory Visit: Payer: 59 | Attending: Specialist | Admitting: Physical Therapy

## 2016-07-05 DIAGNOSIS — M25511 Pain in right shoulder: Secondary | ICD-10-CM | POA: Insufficient documentation

## 2016-07-05 DIAGNOSIS — R293 Abnormal posture: Secondary | ICD-10-CM | POA: Insufficient documentation

## 2016-07-05 DIAGNOSIS — M6281 Muscle weakness (generalized): Secondary | ICD-10-CM | POA: Insufficient documentation

## 2016-07-05 NOTE — Therapy (Signed)
[facility] [address]  [place], [place], [address] Phone: [phone]   Fax:  [phone]  Physical Therapy Evaluation  Patient Details  Name: Danielle Braun MRN: [id number] Date of Birth: 08-01-1961 Referring Provider: Gerrit Halls, PA (Dr. Hart Robinsons)  Encounter Date: 07/05/2016      PT End of Session - 07/05/16 0851    Visit Number 1   Number of Visits 12   Date for PT Re-Evaluation 08/16/16   PT Start Time 0800   PT Stop Time 0840   PT Time Calculation (min) 40 min   Activity Tolerance Patient tolerated treatment well   Behavior During Therapy [facility] for tasks assessed/performed      Past Medical History:  Diagnosis Date  . Acne   . Endometriosis   . GERD (gastroesophageal reflux disease)     Past Surgical History:  Procedure Laterality Date  . ABDOMINAL HYSTERECTOMY    . ABDOMINAL SURGERY     csection  . TONSILLECTOMY      There were no vitals filed for this visit.       Subjective Assessment - 07/05/16 0804    Subjective Pt is a 55 y/o female who presents to OPPT with 3 month hx of R shoulder pain.  Pt reports symptoms began with pain during the night waking pt up.  Pt went to PCP and was given some shoulder exercises without relief so pt followed up with orthopedic MD on 06/26/16.  Pt had injection at orthopedic and then referred to PT.  Pt reports injection helped a little bit but pain returned this past weekend.   Limitations Lifting;House hold activities   Patient Stated Goals improve pain, sleep through the night   Currently in Pain? Yes   Pain Score 4   up to 6/10   Pain Location Shoulder   Pain Orientation Right   Pain Descriptors / Indicators Aching;Dull   Pain Type Chronic pain   Pain Onset More than a month ago   Pain Frequency Intermittent   Aggravating Factors  vacuuming, quick movements forward, reaching overhead   Pain Relieving Factors medication, ice             [facility] PT Assessment - 07/05/16 0809      Assessment   Medical Diagnosis R rotator cuff tendonitis   Referring Provider Gerrit Halls, PA (Dr. Hart Robinsons)   Onset Date/Surgical Date 03/29/16  approx.   Hand Dominance Right   Next MD Visit 07/30/16   Prior Therapy none for this condition     Precautions   Precautions None     Restrictions   Weight Bearing Restrictions No     Balance Screen   Has the patient fallen in the past 6 months No   Has the patient had a decrease in activity level because of a fear of falling?  No   Is the patient reluctant to leave their home because of a fear of falling?  No     Home Ecologist residence   Living Arrangements Spouse/significant other   Additional Comments independent with ADLs     Prior Function   Level of Independence Independent   Vocation Full time employment   Medical sales representative at [employer] - most of day spent at desk and light pt care (ex vitals, measurements)   Leisure reading, cross stitch     Observation/Other Assessments   Focus on Therapeutic Outcomes (FOTO)  60 (40% limited; predicted 30% limited)     Posture/Postural Control   Posture/Postural Control Postural limitations   Postural Limitations Rounded Shoulders;Forward head     AROM   Overall AROM Comments tested in sitting, functional ir and er WNL   AROM Assessment Site Shoulder   Right Shoulder Flexion 129 Degrees   Right Shoulder ABduction 135 Degrees   Right Shoulder Internal Rotation 90 Degrees   Right Shoulder External Rotation 80 Degrees   Left Shoulder Flexion 151 Degrees   Left Shoulder ABduction 165 Degrees   Left Shoulder Internal Rotation 90 Degrees   Left Shoulder External Rotation 90 Degrees     PROM   PROM Assessment Site Shoulder   Right Shoulder Flexion 140 Degrees   Right Shoulder ABduction 145 Degrees   Right Shoulder External Rotation 90 Degrees     Strength   Strength Assessment  Site Shoulder   Right Shoulder Flexion 3-/5   Right Shoulder ABduction 3/5   Right Shoulder Internal Rotation 4/5   Right Shoulder External Rotation 3/5   Left Shoulder Flexion 4/5   Left Shoulder ABduction 4+/5   Left Shoulder Internal Rotation 5/5   Left Shoulder External Rotation 4/5     Palpation   Palpation comment tenderness along R proximal biceps and supraspinatus tendon; tightness noted bil UT     Special Tests    Special Tests Rotator Cuff Impingement   Rotator Cuff Impingment tests Michel Bickers test     Hawkins-Kennedy test   Findings Negative   Side Right                   [facility] Adult PT Treatment/Exercise - 07/05/16 0809      Self-Care   Self-Care Other Self-Care Comments   Other Self-Care Comments  instructed in which exercises to perform at home from PCP exercise program: flexion stretch; scap retraction; added red theraband exercises-see below     Exercises   Exercises Shoulder     Shoulder Exercises: Standing   External Rotation Right;5 reps;Theraband   Theraband Level (Shoulder External Rotation) Level 2 (Red)   Internal Rotation Right;5 reps;Theraband   Theraband Level (Shoulder Internal Rotation) Level 2 (Red)   Retraction Both;5 reps;Theraband   Theraband Level (Shoulder Retraction) Level 2 (Red)   Retraction Limitations with 5 sec hold                PT Education - 07/05/16 0850    Education provided Yes   Education Details reviewed and updated HEP from MD's office   Person(s) Educated Patient   Methods Explanation;Demonstration   Comprehension Verbalized understanding;Returned demonstration             PT Long Term Goals - 07/05/16 0854      PT LONG TERM GOAL #1   Title independent with HEP (08/16/16)   Time 6   Period Weeks   Status New     PT LONG TERM GOAL #2   Title report ability to sleep through the night without pain (08/16/16)   Time 6   Period Weeks   Status New     PT LONG TERM GOAL #3    Title demonstrate ability to lift 5# overhead x 5 reps without increase in pain for improved function and strength (08/16/16)   Time 6   Period Weeks   Status New     PT LONG TERM GOAL #4   Title improve R shoulder abduction and flexion AROM by 8 degrees for improved  motion and strength (08/16/16)   Time 6   Period Weeks   Status New               Plan - 07/05/16 0851    Clinical Impression Statement Pt is a 55 y/o female who presents to OPPT with 3 month hx of R shoulder pain.  Pt demonstrates slightly decreased ROM, postural abnormalities and weakness affecting use of RUE and ADLs.  Will benefit from PT to address these deficits and maximize function.   Rehab Potential Good   PT Frequency 2x / week   PT Duration 6 weeks  anticipate d/c in 4 weeks   PT Treatment/Interventions ADLs/Self Care Home Management;Cryotherapy;Electrical Stimulation;Iontophoresis 4mg /ml Dexamethasone;Moist Heat;Ultrasound;Therapeutic exercise;Therapeutic activities;Functional mobility training;Patient/family education;Manual techniques;Passive range of motion;Vasopneumatic Device;Taping;Dry needling   PT Next Visit Plan review HEP and progress postural exercises (scap and RTC strengthening), address ROM PRN   Consulted and Agree with Plan of Care Patient      Patient will benefit from skilled therapeutic intervention in order to improve the following deficits and impairments:  Decreased range of motion, Postural dysfunction, Decreased strength, Pain, Impaired UE functional use  Visit Diagnosis: Pain in right shoulder - Plan: PT plan of care cert/re-cert  Abnormal posture - Plan: PT plan of care cert/re-cert  Muscle weakness (generalized) - Plan: PT plan of care cert/re-cert     Problem List Patient Active Problem List   Diagnosis Date Noted  . ANKLE SPRAIN, LEFT 04/10/2009  . ACUTE MAXILLARY SINUSITIS 11/06/2008  . URI 11/06/2008       Laureen Abrahams, PT, DPT 07/05/16 9:00  AM    [facility] [address]  [place], [place], [address] Phone: [phone]   Fax:  [phone]  Name: Danielle Braun MRN: [id number] Date of Birth: [birth year]

## 2016-07-05 NOTE — Patient Instructions (Signed)
Perform selected HEP that MD provided to you, add red theraband scapular retraction, internal and external rotation

## 2016-07-06 MED FILL — raNITIdine HCL 150 MG TABS: 150 | 90 days supply | Qty: 180 | Fill #3

## 2016-07-09 MED FILL — MELOXICAM 15 MG TABLET: 15 | 30 days supply | Qty: 30 | Fill #0

## 2016-07-10 ENCOUNTER — Ambulatory Visit: Payer: 59 | Admitting: Physical Therapy

## 2016-07-10 DIAGNOSIS — M25511 Pain in right shoulder: Secondary | ICD-10-CM

## 2016-07-10 DIAGNOSIS — R293 Abnormal posture: Secondary | ICD-10-CM | POA: Diagnosis not present

## 2016-07-10 DIAGNOSIS — M6281 Muscle weakness (generalized): Secondary | ICD-10-CM

## 2016-07-10 NOTE — Therapy (Signed)
[facility] [address]  [place], [place], [address] Phone: [phone]   Fax:  [phone]  Physical Therapy Treatment  Patient Details  Name: Danielle Braun MRN: [id number] Date of Birth: 07-22-1961 Referring Provider: Gerrit Halls, PA (Dr. Hart Robinsons)  Encounter Date: 07/10/2016      PT End of Session - 07/10/16 1528    Visit Number 2   Number of Visits 12   Date for PT Re-Evaluation 08/16/16   PT Start Time 0932   PT Stop Time 1528   PT Time Calculation (min) 43 min   Activity Tolerance Patient tolerated treatment well   Behavior During Therapy [facility] for tasks assessed/performed      Past Medical History:  Diagnosis Date  . Acne   . Endometriosis   . GERD (gastroesophageal reflux disease)     Past Surgical History:  Procedure Laterality Date  . ABDOMINAL HYSTERECTOMY    . ABDOMINAL SURGERY     csection  . TONSILLECTOMY      There were no vitals filed for this visit.      Subjective Assessment - 07/10/16 1448    Subjective went back to taking mobic daily, not taking flexeril anymore.   Limitations Lifting;House hold activities   Patient Stated Goals improve pain, sleep through the night   Pain Score 0-No pain  c/o stiffness                         [facility] Adult PT Treatment/Exercise - 07/10/16 1449      Shoulder Exercises: Supine   Protraction Both;15 reps;Weights   Protraction Weight (lbs) 4   Horizontal ABduction Both;15 reps;Theraband   Theraband Level (Shoulder Horizontal ABduction) Level 2 (Red)   Horizontal ABduction Limitations on 1/2 foam roll   External Rotation Both;15 reps;Theraband   Theraband Level (Shoulder External Rotation) Level 2 (Red)   External Rotation Limitations on 1/2 foam roll   Flexion Both;15 reps;Theraband   Theraband Level (Shoulder Flexion) Level 2 (Red)   Flexion Limitations on 1/2 foam roll     Shoulder Exercises: Standing   External Rotation Right;15 reps;Theraband   Theraband Level (Shoulder External Rotation) Level 2 (Red)   Internal Rotation Right;15 reps;Theraband   Theraband Level (Shoulder Internal Rotation) Level 2 (Red)   Retraction Both;15 reps;Theraband   Theraband Level (Shoulder Retraction) Level 2 (Red)   Retraction Limitations with 5 sec hold     Shoulder Exercises: ROM/Strengthening   UBE (Upper Arm Bike) L2 x 6 min (3' fwd / 3' bwd)     Shoulder Exercises: Isometric Strengthening   Flexion Limitations 10x5"     Shoulder Exercises: Stretch   Other Shoulder Stretches supine 1/2 foam roll x 3 min                     PT Long Term Goals - 07/10/16 1528      PT LONG TERM GOAL #1   Title independent with HEP (08/16/16)   Status On-going     PT LONG TERM GOAL #2   Title report ability to sleep through the night without pain (08/16/16)   Status On-going     PT LONG TERM GOAL #3   Title demonstrate ability to lift 5# overhead x 5 reps without increase in pain for improved function and strength (08/16/16)   Status On-going     PT LONG TERM GOAL #4   Title improve R  shoulder abduction and flexion AROM by 8 degrees for improved motion and strength (08/16/16)   Status On-going               Plan - 07/10/16 1528    Clinical Impression Statement Pt tolerated exercises well today with no significant increase in pain.  Reviewed HEP with min cues needed for isometrics.  No goals met as only 2nd visit.     PT Treatment/Interventions ADLs/Self Care Home Management;Cryotherapy;Electrical Stimulation;Iontophoresis 26m/ml Dexamethasone;Moist Heat;Ultrasound;Therapeutic exercise;Therapeutic activities;Functional mobility training;Patient/family education;Manual techniques;Passive range of motion;Vasopneumatic Device;Taping;Dry needling   PT Next Visit Plan progress postural exercises (scap and RTC strengthening), address ROM PRN, consider dry needling   Consulted and Agree with Plan  of Care Patient      Patient will benefit from skilled therapeutic intervention in order to improve the following deficits and impairments:  Decreased range of motion, Postural dysfunction, Decreased strength, Pain, Impaired UE functional use  Visit Diagnosis: Pain in right shoulder  Abnormal posture  Muscle weakness (generalized)     Problem List Patient Active Problem List   Diagnosis Date Noted  . ANKLE SPRAIN, LEFT 04/10/2009  . ACUTE MAXILLARY SINUSITIS 11/06/2008  . URI 11/06/2008      SLaureen Abrahams PT, DPT 07/10/16 3:30 PM   [facility] [address] [place] [address]: [phone]  Fax:  [phone] Name: Danielle MICHALOWSKIMRN: [id number] of Birth: [birth year]

## 2016-07-12 ENCOUNTER — Encounter: Payer: Self-pay | Admitting: Rehabilitative and Restorative Service Providers"

## 2016-07-12 ENCOUNTER — Ambulatory Visit: Payer: 59 | Admitting: Rehabilitative and Restorative Service Providers"

## 2016-07-12 DIAGNOSIS — M6281 Muscle weakness (generalized): Secondary | ICD-10-CM

## 2016-07-12 DIAGNOSIS — M25511 Pain in right shoulder: Secondary | ICD-10-CM | POA: Diagnosis not present

## 2016-07-12 DIAGNOSIS — R293 Abnormal posture: Secondary | ICD-10-CM | POA: Diagnosis not present

## 2016-07-12 NOTE — Therapy (Signed)
[facility] [address]  [place], [place], [address] Phone: [phone]   Fax:  [phone]  Physical Therapy Treatment  Patient Details  Name: Danielle Braun MRN: [id number] Date of Birth: [birth year] Referring Provider: Gerrit Halls, PA (Dr. Hart Robinsons)  Encounter Date: 07/12/2016      PT End of Session - 07/12/16 0757    Visit Number 3   Number of Visits 12   Date for PT Re-Evaluation 08/16/16   PT Start Time 0800   PT Stop Time 0858   PT Time Calculation (min) 58 min   Activity Tolerance Patient tolerated treatment well      Past Medical History:  Diagnosis Date  . Acne   . Endometriosis   . GERD (gastroesophageal reflux disease)     Past Surgical History:  Procedure Laterality Date  . ABDOMINAL HYSTERECTOMY    . ABDOMINAL SURGERY     csection  . TONSILLECTOMY      There were no vitals filed for this visit.      Subjective Assessment - 07/12/16 0758    Subjective no significant change in pain in the shoulder - can't tell a difference with the injection. She is working on her exercises at home twice a day   Currently in Pain? Yes   Pain Score 4    Pain Location Shoulder   Pain Orientation Right   Pain Descriptors / Indicators Aching   Pain Type Chronic pain   Pain Onset More than a month ago   Pain Frequency Intermittent                         [facility] Adult PT Treatment/Exercise - 07/12/16 0001      Shoulder Exercises: Standing   Other Standing Exercises scap squeeze with noodle 10 sec x 10      Shoulder Exercises: Stretch   Other Shoulder Stretches 3 way doorway stretch 30 sec x 3      Moist Heat Therapy   Number Minutes Moist Heat 15 Minutes   Moist Heat Location Shoulder  thoracic spine/pecs     Electrical Stimulation   Electrical Stimulation Location Rt shd girdle    Electrical Stimulation Action IFC   Electrical Stimulation Parameters to tolerance   Electrical Stimulation Goals Pain;Tone     Manual Therapy   Manual therapy comments supine and prone    Soft tissue mobilization working through the mid thoracic and Rt scapular area; teres; upper traps; pecs    Myofascial Release thoracic spine; pecs           Trigger Point Dry Needling - 07/12/16 0845    Consent Given? Yes   Education Handout Provided Yes   Muscles Treated Upper Body Upper trapezius;Pectoralis major;Pectoralis minor;Levator scapulae;Rhomboids  teres - twitch/decreased tightness to palpation   Upper Trapezius Response Palpable increased muscle length;Twitch reponse elicited   Pectoralis Major Response Palpable increased muscle length   Pectoralis Minor Response Palpable increased muscle length   Levator Scapulae Response Palpable increased muscle length   Rhomboids Response Palpable increased muscle length              PT Education - 07/12/16 0813    Education provided Yes   Education Details posture and alignment; ball release work; HEP; TENS   Person(s) Educated Patient   Methods Explanation;Demonstration;Tactile cues;Verbal cues;Handout   Comprehension Verbalized understanding;Returned demonstration;Verbal cues required;Tactile cues required  PT Long Term Goals - 07/10/16 1528      PT LONG TERM GOAL #1   Title independent with HEP (08/16/16)   Status On-going     PT LONG TERM GOAL #2   Title report ability to sleep through the night without pain (08/16/16)   Status On-going     PT LONG TERM GOAL #3   Title demonstrate ability to lift 5# overhead x 5 reps without increase in pain for improved function and strength (08/16/16)   Status On-going     PT LONG TERM GOAL #4   Title improve R shoulder abduction and flexion AROM by 8 degrees for improved motion and strength (08/16/16)   Status On-going               Plan - 07/12/16 0846    Clinical Impression Statement Tolerated TDN well with good response= decrease in  muscular tightness noted with palpation. Added exercise without difficulty.    Rehab Potential Good   PT Frequency 2x / week   PT Duration 6 weeks   PT Treatment/Interventions ADLs/Self Care Home Management;Cryotherapy;Electrical Stimulation;Iontophoresis 4mg /ml Dexamethasone;Moist Heat;Ultrasound;Therapeutic exercise;Therapeutic activities;Functional mobility training;Patient/family education;Manual techniques;Passive range of motion;Vasopneumatic Device;Taping;Dry needling   PT Next Visit Plan progress postural exercises (scap and RTC strengthening), address ROM PRN, assess response to dry needling   Consulted and Agree with Plan of Care Patient      Patient will benefit from skilled therapeutic intervention in order to improve the following deficits and impairments:  Decreased range of motion, Postural dysfunction, Decreased strength, Pain, Impaired UE functional use  Visit Diagnosis: Pain in right shoulder  Abnormal posture  Muscle weakness (generalized)     Problem List Patient Active Problem List   Diagnosis Date Noted  . ANKLE SPRAIN, LEFT 04/10/2009  . ACUTE MAXILLARY SINUSITIS 11/06/2008  . URI 11/06/2008    Toribio Seiber Nilda Simmer PT, MPH  07/12/2016, 8:48 AM  [facility] [address]  [place], [place], [address] Phone: [phone]   Fax:  [phone]  Name: Danielle Braun MRN: [id number] Date of Birth: [birth year]

## 2016-07-12 NOTE — Patient Instructions (Addendum)
Self massage using a 4 inch rubber ball   Scapula Adduction With Pectoralis Stretch: Low - Standing   Shoulders at 45 hands even with shoulders, keeping weight through legs, shift weight forward until you feel pull or stretch through the front of your chest. Hold _30__ seconds. Do _3__ times, _2-4__ times per day.   Scapula Adduction With Pectoralis Stretch: Mid-Range - Standing   Shoulders at 90 elbows even with shoulders, keeping weight through legs, shift weight forward until you feel pull or strength through the front of your chest. Hold __30_ seconds. Do _3__ times, __2-4_ times per day.   Scapula Adduction With Pectoralis Stretch: High - Standing   Shoulders at 120 hands up high on the doorway, keeping weight on feet, shift weight forward until you feel pull or stretch through the front of your chest. Hold _30__ seconds. Do _3__ times, _2-3__ times per day.   Shoulder Blade Squeeze    Rotate shoulders back, then squeeze shoulder blades down and back. Hold 10 sec Repeat __10 times. Do __several__ sessions per day.    Trigger Point Dry Needling  . What is Trigger Point Dry Needling (DN)? o DN is a physical therapy technique used to treat muscle pain and dysfunction. Specifically, DN helps deactivate muscle trigger points (muscle knots).  o A thin filiform needle is used to penetrate the skin and stimulate the underlying trigger point. The goal is for a local twitch response (LTR) to occur and for the trigger point to relax. No medication of any kind is injected during the procedure.   . What Does Trigger Point Dry Needling Feel Like?  o The procedure feels different for each individual patient. Some patients report that they do not actually feel the needle enter the skin and overall the process is not painful. Very mild bleeding may occur. However, many patients feel a deep cramping in the muscle in which the needle was inserted. This is the local twitch response.    Marland Kitchen How Will I feel after the treatment? o Soreness is normal, and the onset of soreness may not occur for a few hours. Typically this soreness does not last longer than two days.  o Bruising is uncommon, however; ice can be used to decrease any possible bruising.  o In rare cases feeling tired or nauseous after the treatment is normal. In addition, your symptoms may get worse before they get better, this period will typically not last longer than 24 hours.   . What Can I do After My Treatment? o Increase your hydration by drinking more water for the next 24 hours. o You may place ice or heat on the areas treated that have become sore, however, do not use heat on inflamed or bruised areas. Heat often brings more relief post needling. o You can continue your regular activities, but vigorous activity is not recommended initially after the treatment for 24 hours. o DN is best combined with other physical therapy such as strengthening, stretching, and other therapies.   TENS UNIT: This is helpful for muscle pain and spasm.   Search and Purchase a TENS 7000 2nd edition at www.tenspros.com. It should be less than $30.     TENS unit instructions: Do not shower or bathe with the unit on Turn the unit off before removing electrodes or batteries If the electrodes lose stickiness add a drop of water to the electrodes after they are disconnected from the unit and place on plastic sheet. If you continued to have  difficulty, call the TENS unit company to purchase more electrodes. Do not apply lotion on the skin area prior to use. Make sure the skin is clean and dry as this will help prolong the life of the electrodes. After use, always check skin for unusual red areas, rash or other skin difficulties. If there are any skin problems, does not apply electrodes to the same area. Never remove the electrodes from the unit by pulling the wires. Do not use the TENS unit or electrodes other than as  directed. Do not change electrode placement without consultating your therapist or physician. Keep 2 fingers with between each electrode.

## 2016-07-16 ENCOUNTER — Ambulatory Visit: Payer: 59 | Admitting: Physical Therapy

## 2016-07-16 DIAGNOSIS — M6281 Muscle weakness (generalized): Secondary | ICD-10-CM

## 2016-07-16 DIAGNOSIS — M25511 Pain in right shoulder: Secondary | ICD-10-CM

## 2016-07-16 DIAGNOSIS — R293 Abnormal posture: Secondary | ICD-10-CM | POA: Diagnosis not present

## 2016-07-16 NOTE — Therapy (Signed)
[facility] [address]  [place], [place], [address] Phone: [phone]   Fax:  [phone]  Physical Therapy Treatment  Patient Details  Name: Danielle Braun MRN: [id number] Date of Birth: [birth year] Referring Provider: Gerrit Halls, PA (Dr. Hart Robinsons)  Encounter Date: 07/16/2016      PT End of Session - 07/16/16 1526    Visit Number 4   Number of Visits 12   Date for PT Re-Evaluation 08/16/16   PT Start Time [id number]   PT Stop Time 1540   PT Time Calculation (min) 56 min   Activity Tolerance Patient tolerated treatment well   Behavior During Therapy [facility] for tasks assessed/performed      Past Medical History:  Diagnosis Date  . Acne   . Endometriosis   . GERD (gastroesophageal reflux disease)     Past Surgical History:  Procedure Laterality Date  . ABDOMINAL HYSTERECTOMY    . ABDOMINAL SURGERY     csection  . TONSILLECTOMY      There were no vitals filed for this visit.      Subjective Assessment - 07/16/16 1446    Subjective doing well, shoulder was a little sore after the needling but would do it again.   Limitations Lifting;House hold activities   Patient Stated Goals improve pain, sleep through the night   Currently in Pain? Yes   Pain Score 3    Pain Location Shoulder   Pain Orientation Right   Pain Type Chronic pain   Pain Onset More than a month ago   Pain Frequency Intermittent   Aggravating Factors  vacuuming, quick movements forward, reaching overhead   Pain Relieving Factors meds, ice                         [facility] Adult PT Treatment/Exercise - 07/16/16 1448      Shoulder Exercises: Supine   Protraction Right;15 reps;Weights   Protraction Weight (lbs) 5   Protraction Limitations on noodle   Horizontal ABduction Both;15 reps;Theraband   Theraband Level (Shoulder Horizontal ABduction) Level 2 (Red)   Horizontal ABduction Limitations on noodle   External  Rotation Both;15 reps;Theraband   Theraband Level (Shoulder External Rotation) Level 2 (Red)   External Rotation Limitations on noodle   Internal Rotation Right;15 reps;Theraband   Theraband Level (Shoulder Internal Rotation) Level 2 (Red)   Internal Rotation Limitations on noodle   Flexion Right;15 reps;Weights   Shoulder Flexion Weight (lbs) 3   Flexion Limitations on noodle     Shoulder Exercises: Standing   Retraction Both;15 reps;Theraband   Theraband Level (Shoulder Retraction) Level 4 (Blue)   Retraction Limitations with 5 sec hold     Shoulder Exercises: ROM/Strengthening   UBE (Upper Arm Bike) L2.5 x 6 min (3' fwd / 3' bwd)     Shoulder Exercises: Stretch   Other Shoulder Stretches 3 way doorway stretch 30 sec x 3      Moist Heat Therapy   Number Minutes Moist Heat 15 Minutes   Moist Heat Location Shoulder     Electrical Stimulation   Electrical Stimulation Location Rt shd girdle    Electrical Stimulation Action IFC   Electrical Stimulation Parameters to tolerance   Electrical Stimulation Goals Pain;Tone     Manual Therapy   Manual therapy comments supine   Myofascial Release pecs  PT Long Term Goals - 07/10/16 1528      PT LONG TERM GOAL #1   Title independent with HEP (08/16/16)   Status On-going     PT LONG TERM GOAL #2   Title report ability to sleep through the night without pain (08/16/16)   Status On-going     PT LONG TERM GOAL #3   Title demonstrate ability to lift 5# overhead x 5 reps without increase in pain for improved function and strength (08/16/16)   Status On-going     PT LONG TERM GOAL #4   Title improve R shoulder abduction and flexion AROM by 8 degrees for improved motion and strength (08/16/16)   Status On-going               Plan - 07/16/16 1527    Clinical Impression Statement Pt feels TDN helpful; still has some pec tightness.  Increased scap retraction to blue theraband for home.   Progressing well towards goals.   PT Treatment/Interventions ADLs/Self Care Home Management;Cryotherapy;Electrical Stimulation;Iontophoresis 4mg /ml Dexamethasone;Moist Heat;Ultrasound;Therapeutic exercise;Therapeutic activities;Functional mobility training;Patient/family education;Manual techniques;Passive range of motion;Vasopneumatic Device;Taping;Dry needling   PT Next Visit Plan progress postural exercises (scap and RTC strengthening), address ROM PRN, continue TDN PRN   Consulted and Agree with Plan of Care Patient      Patient will benefit from skilled therapeutic intervention in order to improve the following deficits and impairments:  Decreased range of motion, Postural dysfunction, Decreased strength, Pain, Impaired UE functional use  Visit Diagnosis: Pain in right shoulder  Abnormal posture  Muscle weakness (generalized)     Problem List Patient Active Problem List   Diagnosis Date Noted  . ANKLE SPRAIN, LEFT 04/10/2009  . ACUTE MAXILLARY SINUSITIS 11/06/2008  . URI 11/06/2008       Laureen Abrahams, PT, DPT 07/16/16 3:29 PM    [facility] [address]  [place], [place], [address] Phone: [phone]   Fax:  [phone]  Name: Danielle Braun MRN: [id number] Date of Birth: 1960/12/10

## 2016-07-19 ENCOUNTER — Other Ambulatory Visit: Payer: Self-pay | Admitting: Family Medicine

## 2016-07-19 ENCOUNTER — Encounter: Payer: Self-pay | Admitting: Rehabilitative and Restorative Service Providers"

## 2016-07-19 ENCOUNTER — Ambulatory Visit: Payer: 59 | Admitting: Rehabilitative and Restorative Service Providers"

## 2016-07-19 DIAGNOSIS — M25511 Pain in right shoulder: Secondary | ICD-10-CM | POA: Diagnosis not present

## 2016-07-19 DIAGNOSIS — R293 Abnormal posture: Secondary | ICD-10-CM

## 2016-07-19 DIAGNOSIS — Z1231 Encounter for screening mammogram for malignant neoplasm of breast: Secondary | ICD-10-CM

## 2016-07-19 DIAGNOSIS — M6281 Muscle weakness (generalized): Secondary | ICD-10-CM | POA: Diagnosis not present

## 2016-07-19 NOTE — Therapy (Signed)
[facility] [address]  [place], [place], [address] Phone: [phone]   Fax:  [phone]  Physical Therapy Treatment  Patient Details  Name: Danielle Braun MRN: [id number] Date of Birth: Jan 25, 1961 Referring Provider: Gerrit Halls, PA (Dr. Hart Robinsons)  Encounter Date: 07/19/2016      PT End of Session - 07/19/16 1442    Visit Number 5   Number of Visits 12   Date for PT Re-Evaluation 08/16/16   PT Start Time 1444   PT Stop Time 1539   PT Time Calculation (min) 55 min   Activity Tolerance Patient tolerated treatment well      Past Medical History:  Diagnosis Date  . Acne   . Endometriosis   . GERD (gastroesophageal reflux disease)     Past Surgical History:  Procedure Laterality Date  . ABDOMINAL HYSTERECTOMY    . ABDOMINAL SURGERY     csection  . TONSILLECTOMY      There were no vitals filed for this visit.      Subjective Assessment - 07/19/16 1441    Subjective Continued improvement. Good response to TDN - very sore for a couple of days then significantly better. Would like to try more of the TDN    Currently in Pain? Yes   Pain Score 1    Pain Location Shoulder   Pain Orientation Right   Pain Descriptors / Indicators Aching   Pain Type Chronic pain   Pain Onset More than a month ago   Pain Frequency Intermittent                         [facility] Adult PT Treatment/Exercise - 07/19/16 0001      Neuro Re-ed    Neuro Re-ed Details  working on posture and alignment engaging posterior shoulder girdle musculature      Shoulder Exercises: Standing   Other Standing Exercises scap squeeze with noodle 10 sec x 10    Other Standing Exercises chest lift      Shoulder Exercises: Stretch   Other Shoulder Stretches 3 way doorway stretch 30 sec x 3      Moist Heat Therapy   Number Minutes Moist Heat 15 Minutes   Moist Heat Location Shoulder     Electrical Stimulation   Electrical Stimulation Location Rt shd girdle    Electrical Stimulation Action IFC   Electrical Stimulation Parameters to tolerance   Electrical Stimulation Goals Pain;Tone     Manual Therapy   Manual therapy comments prone   Soft tissue mobilization working through the mid thoracic and Rt scapular area; teres; upper traps; pecs    Myofascial Release thoracic spine/Rt scapular area           Trigger Point Dry Needling - 07/19/16 1531    Consent Given? Yes   Upper Trapezius Response Palpable increased muscle length   Levator Scapulae Response Palpable increased muscle length   Rhomboids Response Palpable increased muscle length   Supraspinatus Response Palpable increased muscle length   Infraspinatus Response Palpable increased muscle length   Subscapularis Response Palpable increased muscle length                   PT Long Term Goals - 07/10/16 1528      PT LONG TERM GOAL #1   Title independent with HEP (08/16/16)   Status On-going     PT LONG TERM GOAL #2  Title report ability to sleep through the night without pain (08/16/16)   Status On-going     PT LONG TERM GOAL #3   Title demonstrate ability to lift 5# overhead x 5 reps without increase in pain for improved function and strength (08/16/16)   Status On-going     PT LONG TERM GOAL #4   Title improve R shoulder abduction and flexion AROM by 8 degrees for improved motion and strength (08/16/16)   Status On-going               Plan - 07/19/16 1533    Clinical Impression Statement Good response to TDN - less palpable tightness. Working on posture and alignment.    Rehab Potential Good   PT Frequency 2x / week   PT Duration 6 weeks   PT Treatment/Interventions ADLs/Self Care Home Management;Cryotherapy;Electrical Stimulation;Iontophoresis 4mg /ml Dexamethasone;Moist Heat;Ultrasound;Therapeutic exercise;Therapeutic activities;Functional mobility training;Patient/family education;Manual  techniques;Passive range of motion;Vasopneumatic Device;Taping;Dry needling   PT Next Visit Plan progress postural exercises (scap and RTC strengthening), address ROM PRN, continue TDN PRN   Consulted and Agree with Plan of Care Patient      Patient will benefit from skilled therapeutic intervention in order to improve the following deficits and impairments:  Decreased range of motion, Postural dysfunction, Decreased strength, Pain, Impaired UE functional use  Visit Diagnosis: Pain in right shoulder  Abnormal posture  Muscle weakness (generalized)     Problem List Patient Active Problem List   Diagnosis Date Noted  . ANKLE SPRAIN, LEFT 04/10/2009  . ACUTE MAXILLARY SINUSITIS 11/06/2008  . URI 11/06/2008    Celyn Nilda Simmer PT, MPH 07/19/2016, 3:34 PM  [facility] [address]  [place], [place], [address] Phone: [phone]   Fax:  [phone]  Name: Danielle Braun MRN: [id number] Date of Birth: 1961/09/04

## 2016-07-23 ENCOUNTER — Ambulatory Visit: Payer: 59 | Admitting: Physical Therapy

## 2016-07-23 DIAGNOSIS — M25511 Pain in right shoulder: Secondary | ICD-10-CM | POA: Diagnosis not present

## 2016-07-23 DIAGNOSIS — R293 Abnormal posture: Secondary | ICD-10-CM

## 2016-07-23 DIAGNOSIS — M6281 Muscle weakness (generalized): Secondary | ICD-10-CM

## 2016-07-23 NOTE — Therapy (Signed)
[facility] [address]  [place], [place], [address] Phone: [phone]   Fax:  [phone]  Physical Therapy Treatment  Patient Details  Name: Danielle Braun MRN: [id number] Date of Birth: 08-Jun-1961 Referring Provider: Gerrit Halls, PA (Dr. Hart Robinsons)  Encounter Date: 07/23/2016      PT End of Session - 07/23/16 0843    Visit Number 6   Number of Visits 12   Date for PT Re-Evaluation 08/16/16   PT Start Time [id number]   PT Stop Time 0858   PT Time Calculation (min) 63 min   Activity Tolerance Patient tolerated treatment well   Behavior During Therapy [facility] for tasks assessed/performed      Past Medical History:  Diagnosis Date  . Acne   . Endometriosis   . GERD (gastroesophageal reflux disease)     Past Surgical History:  Procedure Laterality Date  . ABDOMINAL HYSTERECTOMY    . ABDOMINAL SURGERY     csection  . TONSILLECTOMY      There were no vitals filed for this visit.      Subjective Assessment - 07/23/16 0755    Subjective R upper trap is still a little sore today from the TDN, overall shoulder is feeling pretty good.   Limitations Lifting;House hold activities   Patient Stated Goals improve pain, sleep through the night   Currently in Pain? No/denies                         [facility] Adult PT Treatment/Exercise - 07/23/16 0800      Shoulder Exercises: Supine   Horizontal ABduction Both;15 reps;Theraband   Theraband Level (Shoulder Horizontal ABduction) Level 3 (Green)   Horizontal ABduction Limitations on 1/2 foam roll   External Rotation Both;15 reps;Theraband   Theraband Level (Shoulder External Rotation) Level 3 (Green)   External Rotation Limitations on 1/2 foam roll   Flexion Right;15 reps;Theraband   Theraband Level (Shoulder Flexion) Level 3 (Green)   Flexion Limitations on 1/2 foam roll     Shoulder Exercises: Standing   External Rotation Right;15  reps;Theraband   Theraband Level (Shoulder External Rotation) Level 2 (Red)   Internal Rotation Right;15 reps;Theraband   Theraband Level (Shoulder Internal Rotation) Level 2 (Red)   ABduction Right;15 reps;Theraband   Theraband Level (Shoulder ABduction) Level 2 (Red)   ABduction Limitations to 90 degrees   Other Standing Exercises D1/D2 flexion/extension red theraband Right x 15 each     Shoulder Exercises: ROM/Strengthening   UBE (Upper Arm Bike) L 3.0 x 6 min (3' fwd / 3' bwd)     Shoulder Exercises: Stretch   Other Shoulder Stretches 3 way doorway stretch 30 sec x 3      Moist Heat Therapy   Number Minutes Moist Heat 15 Minutes   Moist Heat Location Shoulder     Electrical Stimulation   Electrical Stimulation Location Rt shd girdle    Electrical Stimulation Action IFC   Electrical Stimulation Parameters to tolerance   Electrical Stimulation Goals Pain;Tone     Manual Therapy   Soft tissue mobilization R upper trap scapular area                     PT Long Term Goals - 07/10/16 1528      PT LONG TERM GOAL #1   Title independent with HEP (08/16/16)   Status On-going     PT LONG TERM  GOAL #2   Title report ability to sleep through the night without pain (08/16/16)   Status On-going     PT LONG TERM GOAL #3   Title demonstrate ability to lift 5# overhead x 5 reps without increase in pain for improved function and strength (08/16/16)   Status On-going     PT LONG TERM GOAL #4   Title improve R shoulder abduction and flexion AROM by 8 degrees for improved motion and strength (08/16/16)   Status On-going               Plan - 07/23/16 [id number]    Clinical Impression Statement Pt tolerated increase in strengthening exercises today without increase in pain.  Mod cues for posture needed.  Progressing well towards goals.   PT Treatment/Interventions ADLs/Self Care Home Management;Cryotherapy;Electrical Stimulation;Iontophoresis 4mg /ml Dexamethasone;Moist  Heat;Ultrasound;Therapeutic exercise;Therapeutic activities;Functional mobility training;Patient/family education;Manual techniques;Passive range of motion;Vasopneumatic Device;Taping;Dry needling   PT Next Visit Plan progress postural exercises (scap and RTC strengthening), address ROM PRN, continue TDN PRN   Consulted and Agree with Plan of Care Patient      Patient will benefit from skilled therapeutic intervention in order to improve the following deficits and impairments:  Decreased range of motion, Postural dysfunction, Decreased strength, Pain, Impaired UE functional use  Visit Diagnosis: Pain in right shoulder  Abnormal posture  Muscle weakness (generalized)     Problem List Patient Active Problem List   Diagnosis Date Noted  . ANKLE SPRAIN, LEFT 04/10/2009  . ACUTE MAXILLARY SINUSITIS 11/06/2008  . URI 11/06/2008      Laureen Abrahams, PT, DPT 07/23/16 8:58 AM    [facility] [address]  [place], [place], [address] Phone: [phone]   Fax:  [phone]  Name: Danielle Braun MRN: [id number] Date of Birth: 06/28/1961

## 2016-07-26 ENCOUNTER — Ambulatory Visit: Payer: 59 | Admitting: Rehabilitative and Restorative Service Providers"

## 2016-07-26 ENCOUNTER — Encounter: Payer: Self-pay | Admitting: Rehabilitative and Restorative Service Providers"

## 2016-07-26 DIAGNOSIS — M6281 Muscle weakness (generalized): Secondary | ICD-10-CM

## 2016-07-26 DIAGNOSIS — M25511 Pain in right shoulder: Secondary | ICD-10-CM

## 2016-07-26 DIAGNOSIS — R293 Abnormal posture: Secondary | ICD-10-CM | POA: Diagnosis not present

## 2016-07-26 MED FILL — FLUTICASONE PROP 50 MCG SPR: 50 | 90 days supply | Qty: 48 | Fill #2

## 2016-07-26 NOTE — Patient Instructions (Addendum)
Side Bend, lying down - chin tucked or Sitting with chin tucked     Sit, hand over top of head. Gently pull head to one side. Hold _20__ seconds. Repeat _3__ times per session. Do _2-3__ sessions per day.  Resisted External Rotation: in Neutral - Bilateral   PALMS UP Sit or stand, tubing in both hands, elbows at sides, bent to 90, forearms forward. Pinch shoulder blades together and rotate forearms out. Keep elbows at sides. Repeat __10__ times per set. Do _2-3___ sets per session. Do _2-3___ sessions per day.  Marland Kitchen

## 2016-07-26 NOTE — Therapy (Signed)
[facility] [address]  [place], [place], [address] Phone: [phone]   Fax:  [phone]  Physical Therapy Treatment  Patient Details  Name: Danielle Braun MRN: [id number] Date of Birth: Jun 13, 1961 Referring Provider: Gerrit Halls PA; Dr Theda Sers   Encounter Date: 07/26/2016      PT End of Session - 07/26/16 1613    Visit Number 7   Number of Visits 12   Date for PT Re-Evaluation 08/16/16   PT Start Time 0102   PT Stop Time 1659   PT Time Calculation (min) 55 min   Activity Tolerance Patient tolerated treatment well      Past Medical History:  Diagnosis Date  . Acne   . Endometriosis   . GERD (gastroesophageal reflux disease)     Past Surgical History:  Procedure Laterality Date  . ABDOMINAL HYSTERECTOMY    . ABDOMINAL SURGERY     csection  . TONSILLECTOMY      There were no vitals filed for this visit.      Subjective Assessment - 07/26/16 1615    Subjective Improving - felt much better after [place] worked on it last visit. Some stiffness this am but has noticed it less today. Continued improvement. TDN helps. has not taken Mobic since last weekend - can't tell any difference.    Currently in Pain? Yes   Pain Score 2    Pain Location Shoulder   Pain Orientation Right   Pain Descriptors / Indicators Aching;Sore   Pain Type Chronic pain   Pain Onset More than a month ago   Pain Frequency Intermittent   Aggravating Factors  as the work day goes on symptoms increase    Pain Relieving Factors exercise; ice            [facility] PT Assessment - 07/26/16 0001      Assessment   Medical Diagnosis R rotator cuff tendonitis   Referring Provider Gerrit Halls PA; Dr Theda Sers    Onset Date/Surgical Date 03/29/16  approx.   Hand Dominance Right   Next MD Visit 07/30/16   Prior Therapy none for this condition     Posture/Postural Control   Postural Limitations Rounded Shoulders;Forward head      AROM   Overall AROM Comments tested in sitting, functional ir and er WNL   AROM Assessment Site Shoulder   Right Shoulder Flexion 152 Degrees   Right Shoulder ABduction 146 Degrees   Right Shoulder Internal Rotation 90 Degrees   Right Shoulder External Rotation 85 Degrees   Left Shoulder Flexion 151 Degrees   Left Shoulder ABduction 165 Degrees   Left Shoulder Internal Rotation 90 Degrees   Left Shoulder External Rotation 90 Degrees     PROM   PROM Assessment Site Shoulder  pt supine    Right Shoulder Flexion 161 Degrees   Right Shoulder ABduction 167 Degrees     Strength   Right Shoulder Flexion 4+/5   Right Shoulder ABduction 4+/5   Right Shoulder Internal Rotation 5/5   Right Shoulder External Rotation 4+/5   Left Shoulder Flexion 4+/5   Left Shoulder ABduction 4+/5   Left Shoulder Internal Rotation 5/5   Left Shoulder External Rotation 4+/5     Palpation   Palpation comment mild tenderness along R proximal biceps and supraspinatus tendon; tightness noted bil UT                     [facility] Adult  PT Treatment/Exercise - 07/26/16 0001      Shoulder Exercises: Standing   External Rotation Right;15 reps;Theraband   Theraband Level (Shoulder External Rotation) Level 2 (Red)   Internal Rotation Right;15 reps;Theraband   Theraband Level (Shoulder Internal Rotation) Level 2 (Red)   ABduction Right;15 reps;Theraband   Theraband Level (Shoulder ABduction) Level 2 (Red)   ABduction Limitations to 90 degrees   Retraction Strengthening;Both;20 reps  with swim noodle    Theraband Level (Shoulder Retraction) Level 1 (Yellow)  focus on posterior shoulder girdle contraction   Other Standing Exercises scap squeeze with noodle 10 sec x 10      Shoulder Exercises: ROM/Strengthening   UBE (Upper Arm Bike) L 3.0 x 4 min (2' fwd / 2' bwd)     Shoulder Exercises: Stretch   Other Shoulder Stretches 3 way doorway stretch 30 sec x 3      Moist Heat Therapy   Number  Minutes Moist Heat 15 Minutes   Moist Heat Location Shoulder     Electrical Stimulation   Electrical Stimulation Location Rt shd girdle    Electrical Stimulation Action IFC   Electrical Stimulation Parameters  to tolerance   Electrical Stimulation Goals Pain;Tone     Manual Therapy   Manual therapy comments prone and supine    Soft tissue mobilization R upper trap; leveator; scapular area; pecs   Myofascial Release thoracic spine/Rt scapular area           Trigger Point Dry Needling - 07/26/16 1706    Consent Given? Yes   Upper Trapezius Response Palpable increased muscle length;Twitch reponse elicited   Levator Scapulae Response Palpable increased muscle length   Rhomboids Response Palpable increased muscle length   Supraspinatus Response Palpable increased muscle length   Infraspinatus Response Palpable increased muscle length              PT Education - 07/26/16 1713    Education provided Yes   Education Details HEP    Person(s) Educated Patient   Methods Explanation;Demonstration;Tactile cues;Verbal cues;Handout   Comprehension Verbalized understanding;Returned demonstration;Verbal cues required;Tactile cues required             PT Long Term Goals - 07/26/16 1710      PT LONG TERM GOAL #1   Title independent with HEP (08/16/16)   Time 6   Period Weeks   Status On-going     PT LONG TERM GOAL #2   Title report ability to sleep through the night without pain (08/16/16)   Time 6   Period Weeks   Status Achieved     PT LONG TERM GOAL #3   Title demonstrate ability to lift 5# overhead x 5 reps without increase in pain for improved function and strength (08/16/16)   Time 6   Period Weeks   Status Partially Met     PT LONG TERM GOAL #4   Title improve R shoulder abduction and flexion AROM by 8 degrees for improved motion and strength (08/16/16)   Time 6   Period Weeks   Status Achieved               Plan - 07/26/16 1706    Clinical  Impression Statement Danielle Braun is progressing well with Rt shoudler rehab. She deomnstrates excellent gains in ROM and strength and reports decrease in pain. She notes improved functional activity level. Patient is working well toward established goals of therapy. She will benefit from additional therapy appointments to improve tissue extensibility thorugh anterior  chest/pecs and improve posterior shoudler girdle strength thus improving muscular balance and shoulder girdle function.   Rehab Potential Good   PT Frequency 2x / week   PT Duration 6 weeks   PT Treatment/Interventions ADLs/Self Care Home Management;Cryotherapy;Electrical Stimulation;Iontophoresis 62m/ml Dexamethasone;Moist Heat;Ultrasound;Therapeutic exercise;Therapeutic activities;Functional mobility training;Patient/family education;Manual techniques;Passive range of motion;Vasopneumatic Device;Taping;Dry needling   PT Next Visit Plan progress postural exercises (scap and RTC strengthening), continue TDN PRN   Consulted and Agree with Plan of Care Patient      Patient will benefit from skilled therapeutic intervention in order to improve the following deficits and impairments:  Decreased range of motion, Postural dysfunction, Decreased strength, Pain, Impaired UE functional use  Visit Diagnosis: Pain in right shoulder  Abnormal posture  Muscle weakness (generalized)     Problem List Patient Active Problem List   Diagnosis Date Noted  . ANKLE SPRAIN, LEFT 04/10/2009  . ACUTE MAXILLARY SINUSITIS 11/06/2008  . URI 11/06/2008    Normal Recinos PNilda SimmerPT, MPH  07/26/2016, 5:13 PM  [facility] [place] [address]Phone: [phone]  Fax:  [phone] Name: Danielle VERMEERMRN: [id number] of Birth: 111-16-1962

## 2016-07-30 DIAGNOSIS — M7581 Other shoulder lesions, right shoulder: Secondary | ICD-10-CM | POA: Diagnosis not present

## 2016-07-30 DIAGNOSIS — M25511 Pain in right shoulder: Secondary | ICD-10-CM | POA: Diagnosis not present

## 2016-07-31 ENCOUNTER — Ambulatory Visit: Payer: 59 | Attending: Specialist | Admitting: Physical Therapy

## 2016-07-31 DIAGNOSIS — J302 Other seasonal allergic rhinitis: Secondary | ICD-10-CM | POA: Diagnosis not present

## 2016-07-31 DIAGNOSIS — M542 Cervicalgia: Secondary | ICD-10-CM | POA: Insufficient documentation

## 2016-07-31 DIAGNOSIS — M6281 Muscle weakness (generalized): Secondary | ICD-10-CM | POA: Diagnosis not present

## 2016-07-31 DIAGNOSIS — G8929 Other chronic pain: Secondary | ICD-10-CM | POA: Diagnosis not present

## 2016-07-31 DIAGNOSIS — M25511 Pain in right shoulder: Secondary | ICD-10-CM | POA: Insufficient documentation

## 2016-07-31 DIAGNOSIS — R293 Abnormal posture: Secondary | ICD-10-CM | POA: Diagnosis not present

## 2016-07-31 DIAGNOSIS — R29898 Other symptoms and signs involving the musculoskeletal system: Secondary | ICD-10-CM | POA: Diagnosis not present

## 2016-07-31 NOTE — Therapy (Signed)
[facility] [address]  [place], [place], [address] Phone: [phone]   Fax:  [phone]  Physical Therapy Treatment  Patient Details  Name: Danielle Braun MRN: [id number] Date of Birth: September 20, 1961 Referring Provider: Gerrit Halls PA; Dr Theda Sers   Encounter Date: 07/31/2016      PT End of Session - 07/31/16 0846    Visit Number 8   Number of Visits 12   Date for PT Re-Evaluation 08/16/16   PT Start Time 0800   PT Stop Time 0900   PT Time Calculation (min) 60 min   Activity Tolerance Patient tolerated treatment well   Behavior During Therapy [facility] for tasks assessed/performed      Past Medical History:  Diagnosis Date  . Acne   . Endometriosis   . GERD (gastroesophageal reflux disease)     Past Surgical History:  Procedure Laterality Date  . ABDOMINAL HYSTERECTOMY    . ABDOMINAL SURGERY     csection  . TONSILLECTOMY      There were no vitals filed for this visit.      Subjective Assessment - 07/31/16 0759    Subjective went to MD (Dr. Theda Sers) yesterday.  Feels shoulder is much better but still having some upper trap/neck pain.  Referred to Dr Nelva Bush for further management and treatment of this but plan to continue PT x 2 more weeks (through end of POC)   Limitations Lifting;House hold activities   Patient Stated Goals improve pain, sleep through the night   Currently in Pain? Yes   Pain Score 1    Pain Location Neck   Pain Orientation Right   Pain Descriptors / Indicators Spasm   Pain Type Chronic pain   Pain Onset More than a month ago   Pain Frequency Intermittent   Aggravating Factors  increases as work day progresses   Pain Relieving Factors exercise, ice                         [facility] Adult PT Treatment/Exercise - 07/31/16 0805      Shoulder Exercises: Prone   Retraction Right;15 reps;Weights   Retraction Weight (lbs) 2   Flexion Right;15 reps;Weights   Flexion  Weight (lbs) 1   Flexion Limitations scaption   Extension Right;15 reps;Weights   Extension Weight (lbs) 2   Horizontal ABduction 1 Right;15 reps;Weights   Horizontal ABduction 1 Weight (lbs) 1     Shoulder Exercises: Standing   External Rotation Right;15 reps;Theraband   Theraband Level (Shoulder External Rotation) Level 2 (Red)   Internal Rotation Right;15 reps;Theraband   Theraband Level (Shoulder Internal Rotation) Level 2 (Red)   Other Standing Exercises D1/D2 flexion/extension red theraband Right x 15 each     Shoulder Exercises: ROM/Strengthening   UBE (Upper Arm Bike) L 3.0 x 6 min (3' fwd / 3' bwd)     Shoulder Exercises: Stretch   Other Shoulder Stretches 3 way doorway stretch 30 sec x 3      Moist Heat Therapy   Number Minutes Moist Heat 15 Minutes   Moist Heat Location Shoulder     Electrical Stimulation   Electrical Stimulation Location R upper trap   Electrical Stimulation Action pre mod   Electrical Stimulation Parameters to tolerance   Electrical Stimulation Goals Pain;Tone     Manual Therapy   Manual therapy comments supine   Soft tissue mobilization R upper trap; leveator; scapular area; pecs  PT Education - 07/31/16 0846    Education provided Yes   Education Details prone HEP   Person(s) Educated Patient   Methods Explanation;Demonstration;Handout   Comprehension Verbalized understanding;Returned demonstration             PT Long Term Goals - 07/26/16 1710      PT LONG TERM GOAL #1   Title independent with HEP (08/16/16)   Time 6   Period Weeks   Status On-going     PT LONG TERM GOAL #2   Title report ability to sleep through the night without pain (08/16/16)   Time 6   Period Weeks   Status Achieved     PT LONG TERM GOAL #3   Title demonstrate ability to lift 5# overhead x 5 reps without increase in pain for improved function and strength (08/16/16)   Time 6   Period Weeks   Status Partially Met     PT  LONG TERM GOAL #4   Title improve R shoulder abduction and flexion AROM by 8 degrees for improved motion and strength (08/16/16)   Time 6   Period Weeks   Status Achieved               Plan - 07/31/16 0849    Clinical Impression Statement Pt reports shoulder is feeling much better without any sharp pains.  Pt continues to have some pain and tightness in upper trap on R and will plan to see through POC to address these deficits.    PT Treatment/Interventions ADLs/Self Care Home Management;Cryotherapy;Electrical Stimulation;Iontophoresis 55m/ml Dexamethasone;Moist Heat;Ultrasound;Therapeutic exercise;Therapeutic activities;Functional mobility training;Patient/family education;Manual techniques;Passive range of motion;Vasopneumatic Device;Taping;Dry needling   PT Next Visit Plan progress postural exercises (scap and RTC strengthening), continue TDN PRN   Consulted and Agree with Plan of Care Patient      Patient will benefit from skilled therapeutic intervention in order to improve the following deficits and impairments:  Decreased range of motion, Postural dysfunction, Decreased strength, Pain, Impaired UE functional use  Visit Diagnosis: Right shoulder pain, unspecified chronicity  Abnormal posture  Muscle weakness (generalized)     Problem List Patient Active Problem List   Diagnosis Date Noted  . ANKLE SPRAIN, LEFT 04/10/2009  . ACUTE MAXILLARY SINUSITIS 11/06/2008  . URI 11/06/2008       SLaureen Abrahams PT, Braun 07/31/16 9:01 AM    [facility] [place] [address]Phone: [phone]  Fax:  [phone] Name: Danielle MIDGETTMRN: [id number] of Birth: [birth year]

## 2016-07-31 NOTE — Patient Instructions (Signed)
Extension - Prone (Dumbbell)    Lie with right arm hanging off side of bed. Lift hand back and up. Repeat _15___ times per set. Do __1__ sets per session. Do __5-7__ sessions per week. Use __2__ lb weight.   Copyright  VHI. All rights reserved.   Abduction: Horizontal - Prone (Dumbbell)    Lie with right arm hanging down. Lift arm out to side, thumb up. Repeat _15___ times per set. Do _1___ sets per session. Do _5-7___ sessions per week. Use __1__ lb weight.   Copyright  VHI. All rights reserved.   Scapular: Flexion (Prone)    Holding __1__ pound weights, raise right arms forward. Keep elbows straight. Repeat __15__ times per set. Do __1__ sets per session. Do __1__ sessions per day.  http://orth.exer.us/861   Copyright  VHI. All rights reserved.   Prone Dumbbell Row    Lie prone on bed, body straight, toes touching floor. Lift _2__ lb dumbbells, pulling right arm up and pinching shoulder blade. Do _1__ sets of __15_ repetitions.   Copyright  VHI. All rights reserved.

## 2016-08-02 ENCOUNTER — Ambulatory Visit: Payer: 59 | Admitting: Rehabilitative and Restorative Service Providers"

## 2016-08-02 ENCOUNTER — Encounter: Payer: Self-pay | Admitting: Rehabilitative and Restorative Service Providers"

## 2016-08-02 DIAGNOSIS — M6281 Muscle weakness (generalized): Secondary | ICD-10-CM | POA: Diagnosis not present

## 2016-08-02 DIAGNOSIS — R293 Abnormal posture: Secondary | ICD-10-CM

## 2016-08-02 DIAGNOSIS — G8929 Other chronic pain: Secondary | ICD-10-CM

## 2016-08-02 DIAGNOSIS — M25511 Pain in right shoulder: Secondary | ICD-10-CM | POA: Diagnosis not present

## 2016-08-02 DIAGNOSIS — R29898 Other symptoms and signs involving the musculoskeletal system: Secondary | ICD-10-CM | POA: Diagnosis not present

## 2016-08-02 DIAGNOSIS — M542 Cervicalgia: Secondary | ICD-10-CM | POA: Diagnosis not present

## 2016-08-02 DIAGNOSIS — J302 Other seasonal allergic rhinitis: Secondary | ICD-10-CM | POA: Diagnosis not present

## 2016-08-02 NOTE — Therapy (Signed)
[facility] [address]  [place], [place], [address] Phone: [phone]   Fax:  [phone]  Physical Therapy Treatment  Patient Details  Name: Danielle Braun MRN: [id number] Date of Birth: 06/11/1961 Referring Provider: Gerrit Halls PA; Dr Theda Sers   Encounter Date: 08/02/2016      PT End of Session - 08/02/16 1620    Visit Number 9   Number of Visits 12   Date for PT Re-Evaluation 08/16/16   PT Start Time 1618   PT Stop Time 1709   PT Time Calculation (min) 51 min   Activity Tolerance Patient tolerated treatment well      Past Medical History:  Diagnosis Date  . Acne   . Endometriosis   . GERD (gastroesophageal reflux disease)     Past Surgical History:  Procedure Laterality Date  . ABDOMINAL HYSTERECTOMY    . ABDOMINAL SURGERY     csection  . TONSILLECTOMY      There were no vitals filed for this visit.      Subjective Assessment - 08/02/16 1622    Subjective Improving but still has pain in the top of the Rt shoulder. Has an appointment with Dr. Nelva Bush' PA next week 08/08/16. She feels the therapy is helping. She can feel that her shoulders are up better and now she notices when she slumps and automatically straightens back up.    Currently in Pain? Yes   Pain Score 1    Pain Location Shoulder   Pain Orientation Right   Pain Descriptors / Indicators Tightness   Pain Type Chronic pain   Pain Onset More than a month ago   Pain Frequency Intermittent                         [facility] Adult PT Treatment/Exercise - 08/02/16 0001      Shoulder Exercises: ROM/Strengthening   UBE (Upper Arm Bike) L 3.0 x 6 min (3' fwd / 3' bwd)     Shoulder Exercises: Stretch   Other Shoulder Stretches 3 way doorway stretch 30 sec x 3   correcting technique     Moist Heat Therapy   Number Minutes Moist Heat 15 Minutes   Moist Heat Location Shoulder     Electrical Stimulation   Electrical  Stimulation Location R upper trap; periscapular musculature    Electrical Stimulation Action IFC   Electrical Stimulation Parameters to tolerance   Electrical Stimulation Goals Pain;Tone     Manual Therapy   Manual therapy comments supine   Soft tissue mobilization R upper trap; leveator; scapular area   Myofascial Release thoracic spine/Rt scapular area                      PT Long Term Goals - 08/02/16 1626      PT LONG TERM GOAL #1   Title independent with HEP (08/16/16)   Time 6   Period Weeks   Status On-going     PT LONG TERM GOAL #2   Title report ability to sleep through the night without pain (08/16/16)   Time 6   Period Weeks   Status Achieved     PT LONG TERM GOAL #3   Title demonstrate ability to lift 5# overhead x 5 reps without increase in pain for improved function and strength (08/16/16)   Time 6   Period Weeks   Status Partially Met  PT LONG TERM GOAL #4   Title improve R shoulder abduction and flexion AROM by 8 degrees for improved motion and strength (08/16/16)   Time 6   Period Weeks   Status Achieved               Plan - 08/02/16 1627    Clinical Impression Statement patient continues to report improvement in the the tightness through the shoulders. Less tightness to palpation through the cervical and thoracic musculature. Responding well to TDN, stretching, posterior shoulder girdle strengtheing; and postural correction. Progressing well.    Rehab Potential Good   PT Frequency 2x / week   PT Duration 6 weeks   PT Treatment/Interventions ADLs/Self Care Home Management;Cryotherapy;Electrical Stimulation;Iontophoresis 34m/ml Dexamethasone;Moist Heat;Ultrasound;Therapeutic exercise;Therapeutic activities;Functional mobility training;Patient/family education;Manual techniques;Passive range of motion;Vasopneumatic Device;Taping;Dry needling   PT Next Visit Plan progress postural exercises (scap and RTC strengthening), continue TDN  PRN   Consulted and Agree with Plan of Care Patient      Patient will benefit from skilled therapeutic intervention in order to improve the following deficits and impairments:  Decreased range of motion, Postural dysfunction, Decreased strength, Pain, Impaired UE functional use  Visit Diagnosis: Right shoulder pain, unspecified chronicity  Abnormal posture  Muscle weakness (generalized)  Chronic right shoulder pain     Problem List Patient Active Problem List   Diagnosis Date Noted  . ANKLE SPRAIN, LEFT 04/10/2009  . ACUTE MAXILLARY SINUSITIS 11/06/2008  . URI 11/06/2008    Janith Nielson PNilda SimmerPT, MPH 08/02/2016, 5:12 PM  [facility] [place] [address]Phone: [phone]  Fax:  [phone] Name: CDEANGELA RANDLEMANMRN: [id number] of Birth: 119-Oct-1962

## 2016-08-06 DIAGNOSIS — H43393 Other vitreous opacities, bilateral: Secondary | ICD-10-CM | POA: Diagnosis not present

## 2016-08-06 DIAGNOSIS — H524 Presbyopia: Secondary | ICD-10-CM | POA: Diagnosis not present

## 2016-08-06 DIAGNOSIS — H16223 Keratoconjunctivitis sicca, not specified as Sjogren's, bilateral: Secondary | ICD-10-CM | POA: Diagnosis not present

## 2016-08-06 DIAGNOSIS — H5213 Myopia, bilateral: Secondary | ICD-10-CM | POA: Diagnosis not present

## 2016-08-06 DIAGNOSIS — H52203 Unspecified astigmatism, bilateral: Secondary | ICD-10-CM | POA: Diagnosis not present

## 2016-08-07 ENCOUNTER — Ambulatory Visit: Payer: 59 | Admitting: Physical Therapy

## 2016-08-07 DIAGNOSIS — M25511 Pain in right shoulder: Secondary | ICD-10-CM

## 2016-08-07 DIAGNOSIS — R293 Abnormal posture: Secondary | ICD-10-CM | POA: Diagnosis not present

## 2016-08-07 DIAGNOSIS — M6281 Muscle weakness (generalized): Secondary | ICD-10-CM | POA: Diagnosis not present

## 2016-08-07 DIAGNOSIS — G8929 Other chronic pain: Secondary | ICD-10-CM | POA: Diagnosis not present

## 2016-08-07 DIAGNOSIS — J302 Other seasonal allergic rhinitis: Secondary | ICD-10-CM | POA: Diagnosis not present

## 2016-08-07 DIAGNOSIS — M542 Cervicalgia: Secondary | ICD-10-CM | POA: Diagnosis not present

## 2016-08-07 DIAGNOSIS — R29898 Other symptoms and signs involving the musculoskeletal system: Secondary | ICD-10-CM | POA: Diagnosis not present

## 2016-08-07 NOTE — Therapy (Signed)
[facility] [address]  [place], [place], [address] Phone: [phone]   Fax:  [phone]  Physical Therapy Treatment  Patient Details  Name: Danielle Braun MRN: [id number] Date of Birth: Nov 22, 1960 Referring Provider: Gerrit Halls PA; Dr Theda Sers   Encounter Date: 08/07/2016      PT End of Session - 08/07/16 0840    Visit Number 10   Number of Visits 12   Date for PT Re-Evaluation 08/16/16   PT Start Time 0800   PT Stop Time 0839   PT Time Calculation (min) 39 min   Activity Tolerance Patient tolerated treatment well   Behavior During Therapy [facility] for tasks assessed/performed      Past Medical History:  Diagnosis Date  . Acne   . Endometriosis   . GERD (gastroesophageal reflux disease)     Past Surgical History:  Procedure Laterality Date  . ABDOMINAL HYSTERECTOMY    . ABDOMINAL SURGERY     csection  . TONSILLECTOMY      There were no vitals filed for this visit.      Subjective Assessment - 08/07/16 0802    Subjective was pretty sore over weekend but yesterday "hardly noticed anything."  feels much better.   Limitations Lifting;House hold activities   Patient Stated Goals improve pain, sleep through the night   Currently in Pain? Yes   Pain Score 1    Pain Location Shoulder   Pain Orientation Right   Pain Descriptors / Indicators Tightness   Pain Type Chronic pain   Pain Onset More than a month ago   Pain Frequency Intermittent   Aggravating Factors  increases as work day progresses   Pain Relieving Factors exercise, ice            [facility] PT Assessment - 08/07/16 0809      Functional Tests   Functional tests Other     Other:   Other/ Comments pt performed overhead reaching with 5# x 5 reps - no pain or difficulty     AROM   Right Shoulder Flexion 155 Degrees   Right Shoulder ABduction 157 Degrees   Right Shoulder Internal Rotation 90 Degrees   Right Shoulder External  Rotation 85 Degrees     Strength   Right Shoulder Flexion 5/5   Right Shoulder ABduction 4+/5   Right Shoulder Internal Rotation 5/5   Right Shoulder External Rotation 4+/5                     [facility] Adult PT Treatment/Exercise - 08/07/16 0804      Shoulder Exercises: Prone   Retraction Right;15 reps;Weights   Retraction Weight (lbs) 2   Flexion Right;15 reps;Weights   Flexion Weight (lbs) 1   Flexion Limitations scaption   Extension Right;15 reps;Weights   Extension Weight (lbs) 2   Horizontal ABduction 1 Right;15 reps;Weights   Horizontal ABduction 1 Weight (lbs) 1     Shoulder Exercises: Standing   External Rotation Both;10 reps;Theraband   Theraband Level (Shoulder External Rotation) Level 2 (Red);Level 3 (Green)   Internal Rotation Right;10 reps;Theraband   Theraband Level (Shoulder Internal Rotation) Level 3 (Green)   Other Standing Exercises scap squeeze in doorway 10x10 sec     Shoulder Exercises: ROM/Strengthening   UBE (Upper Arm Bike) L 3.5 x 6 min (3' fwd / 3' bwd)     Shoulder Exercises: Stretch   Other Shoulder Stretches 3 way doorway stretch 30  sec x 3                      PT Long Term Goals - 08/07/16 0841      PT LONG TERM GOAL #1   Title independent with HEP (08/16/16)   Status Achieved     PT LONG TERM GOAL #2   Title report ability to sleep through the night without pain (08/16/16)   Status Achieved     PT LONG TERM GOAL #3   Title demonstrate ability to lift 5# overhead x 5 reps without increase in pain for improved function and strength (08/16/16)   Status Achieved     PT LONG TERM GOAL #4   Title improve R shoulder abduction and flexion AROM by 8 degrees for improved motion and strength (08/16/16)   Status Achieved               Plan - 08/07/16 0841    Clinical Impression Statement Pt has met all goals and anticipate d/c next session.  Pt reports some continued neck pain and plans to see MD tomorrow.  If  pt gets new referral will plan to d/c from current episode and initiate new evaluation.   PT Treatment/Interventions ADLs/Self Care Home Management;Cryotherapy;Electrical Stimulation;Iontophoresis 4m/ml Dexamethasone;Moist Heat;Ultrasound;Therapeutic exercise;Therapeutic activities;Functional mobility training;Patient/family education;Manual techniques;Passive range of motion;Vasopneumatic Device;Taping;Dry needling   PT Next Visit Plan reassess need for TDN. plan to d/c shoulder and initiate neck eval if pt has referral; otherwise plan for d/c   Consulted and Agree with Plan of Care Patient      Patient will benefit from skilled therapeutic intervention in order to improve the following deficits and impairments:  Decreased range of motion, Postural dysfunction, Decreased strength, Pain, Impaired UE functional use  Visit Diagnosis: Right shoulder pain, unspecified chronicity  Abnormal posture  Muscle weakness (generalized)     Problem List Patient Active Problem List   Diagnosis Date Noted  . ANKLE SPRAIN, LEFT 04/10/2009  . ACUTE MAXILLARY SINUSITIS 11/06/2008  . URI 11/06/2008      SLaureen Abrahams PT, DPT 08/07/16 8:46 AM    [facility] [place] [address]Phone: [phone]  Fax:  [phone] Name: Danielle GOHEENMRN: [id number] of Birth: [birth year]

## 2016-08-08 DIAGNOSIS — M542 Cervicalgia: Secondary | ICD-10-CM | POA: Diagnosis not present

## 2016-08-09 ENCOUNTER — Ambulatory Visit: Payer: 59 | Admitting: Rehabilitative and Restorative Service Providers"

## 2016-08-09 DIAGNOSIS — R293 Abnormal posture: Secondary | ICD-10-CM | POA: Diagnosis not present

## 2016-08-09 DIAGNOSIS — M6281 Muscle weakness (generalized): Secondary | ICD-10-CM | POA: Diagnosis not present

## 2016-08-09 DIAGNOSIS — J302 Other seasonal allergic rhinitis: Secondary | ICD-10-CM | POA: Diagnosis not present

## 2016-08-09 DIAGNOSIS — R29898 Other symptoms and signs involving the musculoskeletal system: Secondary | ICD-10-CM | POA: Diagnosis not present

## 2016-08-09 DIAGNOSIS — G8929 Other chronic pain: Secondary | ICD-10-CM | POA: Diagnosis not present

## 2016-08-09 DIAGNOSIS — M542 Cervicalgia: Secondary | ICD-10-CM

## 2016-08-09 DIAGNOSIS — M25511 Pain in right shoulder: Secondary | ICD-10-CM | POA: Diagnosis not present

## 2016-08-09 NOTE — Patient Instructions (Addendum)
Side Bend, Sitting    Sit, head in comfortable, centered position, chin slightly tucked. Gently tilt head, bringing ear toward same-side shoulder. Hold _15-20__ seconds.  Repeat _3_ times per session. Do _2-3__ sessions per day.     Side Bend, Standing    Stand, one hand grasping other arm above wrist behind body. Pull down while gently tilting head toward pulling side. Hold __20_ seconds. Repeat _3__ times per session. Do _2-3__ sessions per day.

## 2016-08-09 NOTE — Therapy (Addendum)
[facility] [address]  [place], [place], [address] Phone: [phone]   Fax:  [phone]  Physical Therapy Treatment  Patient Details  Name: Danielle Braun MRN: [id number] Date of Birth: [birth year] Referring Provider: Dr. Suella Broad / Levy Pupa, [place]  Encounter Date: 08/09/2016      PT End of Session - 08/09/16 1210    Visit Number 1   Number of Visits 12   Date for PT Re-Evaluation 09/27/16   PT Start Time 1104   PT Stop Time 1208   PT Time Calculation (min) 64 min   Activity Tolerance Patient tolerated treatment well      Past Medical History:  Diagnosis Date  . Acne   . Endometriosis   . GERD (gastroesophageal reflux disease)     Past Surgical History:  Procedure Laterality Date  . ABDOMINAL HYSTERECTOMY    . ABDOMINAL SURGERY     csection  . TONSILLECTOMY      There were no vitals filed for this visit.      Subjective Assessment - 08/09/16 1102    Subjective Has new referral for neck pain from Dr Nelva Bush.  Chronic hx of neck pain x 17 years   Patient Stated Goals improve pain, sleep through the night   Currently in Pain? Yes   Pain Score 3    Pain Location Neck   Pain Orientation Right;Posterior   Pain Descriptors / Indicators Aching   Pain Type Chronic pain   Pain Onset More than a month ago   Pain Frequency Intermittent   Aggravating Factors  sitting at computer   Pain Relieving Factors exercise, ice            [facility] PT Assessment - 08/09/16 1106      Assessment   Medical Diagnosis neck pain, mild C-DDD   Referring Provider Dr. Suella Broad / Levy Pupa, PA   Onset Date/Surgical Date 03/29/16  cervical pain - chronic in nature - recent flare up    Hand Dominance Right   Next MD Visit PRN   Prior Therapy for shoulder      Precautions   Precautions None     Balance Screen   Has the patient fallen in the past 6 months No   Has the patient had a decrease in  activity level because of a fear of falling?  No   Is the patient reluctant to leave their home because of a fear of falling?  No     Observation/Other Assessments   Focus on Therapeutic Outcomes (FOTO)  32% limitation      Sensation   Additional Comments WFL's     AROM   AROM Assessment Site Cervical   Cervical Flexion 36   Cervical Extension 52   Cervical - Right Side Bend 25   Cervical - Left Side Bend 25   Cervical - Right Rotation 73   Cervical - Left Rotation 73     Strength   Right Shoulder Flexion 5/5   Right Shoulder ABduction 4+/5   Right Shoulder Internal Rotation 5/5   Right Shoulder External Rotation 4+/5     Palpation   Palpation comment tenderness and tightness through bilat cervical paraspinals; scaleni; SCM; upper trap; leveator Rt > Lt                      [facility] Adult PT Treatment/Exercise - 08/09/16 1104      Neck Exercises:  Standing   Neck Retraction 10 reps;10 secs     Neck Exercises: Supine   Neck Retraction 5 reps;10 secs     Shoulder Exercises: ROM/Strengthening   UBE (Upper Arm Bike) L 3.5 x 6 min (3' fwd / 3' bwd)     Moist Heat Therapy   Number Minutes Moist Heat 15 Minutes   Moist Heat Location Shoulder;Cervical     Electrical Stimulation   Electrical Stimulation Location bilat cervical; and Rt upper trap    Electrical Stimulation Action IFC   Electrical Stimulation Parameters to tolerance   Electrical Stimulation Goals Pain;Tone     Manual Therapy   Manual therapy comments prone and supine    Soft tissue mobilization bilat cervical paraspinals; ant/lat/posterior cervical musculature    Myofascial Release cervical and upper trap      Neck Exercises: Stretches   Upper Trapezius Stretch 3 reps;20 seconds  standing arm behind back   Other Neck Stretches lateral cervical flexion supine and standing 20 sec x 3 each direcion each position           Trigger Point Dry Needling - 08/09/16 1206    Muscles Treated Upper  Body Longissimus;Levator scapulae;Upper trapezius  bilat    Upper Trapezius Response Palpable increased muscle length   Longissimus Response Palpable increased muscle length              PT Education - 08/09/16 1202    Education provided Yes   Education Details postural education; HEP   Person(s) Educated Patient   Methods Explanation;Demonstration;Tactile cues;Verbal cues;Handout   Comprehension Verbalized understanding;Returned demonstration;Verbal cues required;Tactile cues required             PT Long Term Goals - 08/09/16 1226      PT LONG TERM GOAL #1   Title independent with HEP for cervical dysfunction (09/27/16)   Time 6   Period Weeks   Status New     PT LONG TERM GOAL #2   Title Increase cervical ROM by 5-7 degrees in lateral cervical flexion 09/27/16   Time 6   Period Weeks   Status New     PT LONG TERM GOAL #3   Title Patient to report ability to work at desk during her typical work day with no neck pain 09/27/16   Time 6   Period Weeks   Status New     PT LONG TERM GOAL #4   Title Improve FOTO to </= 30% limitaion 09/27/16   Time 6   Period Weeks   Status New               Plan - 08/09/16 1221    Clinical Impression Statement [name] presents with resolving shoulder pain and dysfunction but complaints of chronic cervical and upper trap pain and tightness which has been present for several years. She has head forward posture and alignment; increased thoracic kyphosis; decresaed postural strength through the posterior shoulder girdle; limited cervical mobility; muscular tightness through the ant/lat/post cervical musculature as well as upper traps/leveator musculature.    Rehab Potential Good   PT Frequency 2x / week   PT Duration 6 weeks   PT Treatment/Interventions Patient/family education;ADLs/Self Care Home Management;Cryotherapy;Electrical Stimulation;Iontophoresis 4mg /ml Dexamethasone;Moist Heat;Ultrasound;Traction;Dry needling;Manual  techniques;Neuromuscular re-education;Therapeutic activities;Therapeutic exercise   PT Next Visit Plan posterior shoulder girdle strengthening; cervical stretching; manual work & TDN cervical musculature; modalities as indicated    Consulted and Agree with Plan of Care Patient      Patient will benefit from  skilled therapeutic intervention in order to improve the following deficits and impairments:  Postural dysfunction, Improper body mechanics, Increased fascial restricitons, Increased muscle spasms, Pain, Decreased range of motion, Decreased mobility, Decreased strength, Decreased activity tolerance  Visit Diagnosis: Cervicalgia - Plan: PT plan of care cert/re-cert  Abnormal posture - Plan: PT plan of care cert/re-cert  Muscle weakness (generalized) - Plan: PT plan of care cert/re-cert  Other symptoms and signs involving the musculoskeletal system - Plan: PT plan of care cert/re-cert     Problem List Patient Active Problem List   Diagnosis Date Noted  . ANKLE SPRAIN, LEFT 04/10/2009  . ACUTE MAXILLARY SINUSITIS 11/06/2008  . URI 11/06/2008    Daylin Eads Nilda Simmer PT, MPH  08/09/2016, 4:08 PM  [facility] [address]  [place], [place], [address] Phone: [phone]   Fax:  [phone]  Name: FALYN LUYSTER MRN: [id number] Date of Birth: 07/11/1961

## 2016-08-14 ENCOUNTER — Ambulatory Visit: Payer: 59 | Admitting: Physical Therapy

## 2016-08-14 DIAGNOSIS — J302 Other seasonal allergic rhinitis: Secondary | ICD-10-CM | POA: Diagnosis not present

## 2016-08-14 DIAGNOSIS — M6281 Muscle weakness (generalized): Secondary | ICD-10-CM | POA: Diagnosis not present

## 2016-08-14 DIAGNOSIS — R29898 Other symptoms and signs involving the musculoskeletal system: Secondary | ICD-10-CM | POA: Diagnosis not present

## 2016-08-14 DIAGNOSIS — R293 Abnormal posture: Secondary | ICD-10-CM

## 2016-08-14 DIAGNOSIS — M542 Cervicalgia: Secondary | ICD-10-CM

## 2016-08-14 DIAGNOSIS — G8929 Other chronic pain: Secondary | ICD-10-CM | POA: Diagnosis not present

## 2016-08-14 DIAGNOSIS — M25511 Pain in right shoulder: Secondary | ICD-10-CM | POA: Diagnosis not present

## 2016-08-14 NOTE — Therapy (Signed)
[facility] [address]  [place], [place], [address] Phone: [phone]   Fax:  [phone]  Physical Therapy Treatment  Patient Details  Name: SWETA MAIDA MRN: [id number] Date of Birth: 1961/09/23 Referring Provider: Dr. Suella Broad / Levy Pupa, [place]  Encounter Date: 08/14/2016      PT End of Session - 08/14/16 1440    Visit Number 2   Number of Visits 12   Date for PT Re-Evaluation 09/27/16   PT Start Time 1400   PT Stop Time 1440   PT Time Calculation (min) 40 min   Activity Tolerance Patient tolerated treatment well   Behavior During Therapy [facility] for tasks assessed/performed      Past Medical History:  Diagnosis Date  . Acne   . Endometriosis   . GERD (gastroesophageal reflux disease)     Past Surgical History:  Procedure Laterality Date  . ABDOMINAL HYSTERECTOMY    . ABDOMINAL SURGERY     csection  . TONSILLECTOMY      There were no vitals filed for this visit.      Subjective Assessment - 08/14/16 1404    Subjective no neck pain just tightness.  TDN in neck "was intense."   Patient Stated Goals improve pain, sleep through the night   Currently in Pain? No/denies                         [facility] Adult PT Treatment/Exercise - 08/14/16 1404      Neck Exercises: Supine   Neck Retraction 10 reps;5 secs   Neck Retraction Limitations on noodle   Shoulder Flexion Both;15 reps   Shoulder Flexion Limitations yellow theraband on noodle   Shoulder ABduction Both;15 reps   Shoulder Abduction Limitations yellow theraband on noodle; horizontal abduction   Other Supine Exercise bil er with yellow theraband on pool noodle x 15     Shoulder Exercises: ROM/Strengthening   UBE (Upper Arm Bike) NuStep L5 UEs only x 6 min     Manual Therapy   Manual therapy comments supine   Soft tissue mobilization bilat cervical paraspinals; ant/lat/posterior cervical musculature    Myofascial  Release cervical and upper trap      Neck Exercises: Stretches   Upper Trapezius Stretch 3 reps;20 seconds  sitting   Levator Stretch 3 reps;20 seconds                     PT Long Term Goals - 08/09/16 1226      PT LONG TERM GOAL #1   Title independent with HEP for cervical dysfunction (09/27/16)   Time 6   Period Weeks   Status New     PT LONG TERM GOAL #2   Title Increase cervical ROM by 5-7 degrees in lateral cervical flexion 09/27/16   Time 6   Period Weeks   Status New     PT LONG TERM GOAL #3   Title Patient to report ability to work at desk during her typical work day with no neck pain 09/27/16   Time 6   Period Weeks   Status New     PT LONG TERM GOAL #4   Title Improve FOTO to </= 30% limitaion 09/27/16   Time 6   Period Weeks   Status New               Plan - 08/14/16 1441    Clinical  Impression Statement Pt tolerated exercises well today and has continued tightness in bil cervical muscles and upper trap.  Will continue to benefit from PT to maximize function.   PT Treatment/Interventions Patient/family education;ADLs/Self Care Home Management;Cryotherapy;Electrical Stimulation;Iontophoresis 4mg /ml Dexamethasone;Moist Heat;Ultrasound;Traction;Dry needling;Manual techniques;Neuromuscular re-education;Therapeutic activities;Therapeutic exercise   PT Next Visit Plan posterior shoulder girdle strengthening; cervical stretching; manual work & TDN cervical musculature; modalities as indicated    Consulted and Agree with Plan of Care Patient      Patient will benefit from skilled therapeutic intervention in order to improve the following deficits and impairments:  Postural dysfunction, Improper body mechanics, Increased fascial restricitons, Increased muscle spasms, Pain, Decreased range of motion, Decreased mobility, Decreased strength, Decreased activity tolerance  Visit Diagnosis: Cervicalgia  Abnormal posture  Muscle weakness  (generalized)  Other symptoms and signs involving the musculoskeletal system     Problem List Patient Active Problem List   Diagnosis Date Noted  . ANKLE SPRAIN, LEFT 04/10/2009  . ACUTE MAXILLARY SINUSITIS 11/06/2008  . URI 11/06/2008      Laureen Abrahams, PT, DPT 08/14/16 2:43 PM    [facility] [address]  [place], [place], [address] Phone: [phone]   Fax:  [phone]  Name: REIGHLYNN DEFREECE MRN: [id number] Date of Birth: 1961-06-08

## 2016-08-16 ENCOUNTER — Encounter: Payer: Self-pay | Admitting: Rehabilitative and Restorative Service Providers"

## 2016-08-16 ENCOUNTER — Ambulatory Visit: Payer: 59 | Admitting: Rehabilitative and Restorative Service Providers"

## 2016-08-16 DIAGNOSIS — J302 Other seasonal allergic rhinitis: Secondary | ICD-10-CM | POA: Diagnosis not present

## 2016-08-16 DIAGNOSIS — M542 Cervicalgia: Secondary | ICD-10-CM | POA: Diagnosis not present

## 2016-08-16 DIAGNOSIS — M6281 Muscle weakness (generalized): Secondary | ICD-10-CM | POA: Diagnosis not present

## 2016-08-16 DIAGNOSIS — M25511 Pain in right shoulder: Secondary | ICD-10-CM | POA: Diagnosis not present

## 2016-08-16 DIAGNOSIS — R29898 Other symptoms and signs involving the musculoskeletal system: Secondary | ICD-10-CM

## 2016-08-16 DIAGNOSIS — R293 Abnormal posture: Secondary | ICD-10-CM

## 2016-08-16 DIAGNOSIS — G8929 Other chronic pain: Secondary | ICD-10-CM | POA: Diagnosis not present

## 2016-08-16 NOTE — Therapy (Signed)
[facility] [address]  [place], [place], [address] Phone: [phone]   Fax:  [phone]  Physical Therapy Treatment  Patient Details  Name: Danielle Braun MRN: [id number] Date of Birth: 1961/03/16 Referring Provider: Dr. Suella Broad / Levy Pupa, [place]  Encounter Date: 08/16/2016      PT End of Session - 08/16/16 0756    Visit Number 3   Number of Visits 12   Date for PT Re-Evaluation 09/27/16   PT Start Time 0756   PT Stop Time 0852   PT Time Calculation (min) 56 min   Activity Tolerance Patient tolerated treatment well      Past Medical History:  Diagnosis Date  . Acne   . Endometriosis   . GERD (gastroesophageal reflux disease)     Past Surgical History:  Procedure Laterality Date  . ABDOMINAL HYSTERECTOMY    . ABDOMINAL SURGERY     csection  . TONSILLECTOMY      There were no vitals filed for this visit.      Subjective Assessment - 08/16/16 0757    Subjective Neck is feeling OK. Some pain in the Lt scapular area today. Pain in that area 4/10 today.    Currently in Pain? No/denies   Pain Location Neck   Pain Orientation Right;Posterior   Pain Descriptors / Indicators Tightness   Pain Type Chronic pain   Pain Onset More than a month ago   Pain Frequency Intermittent                         [facility] Adult PT Treatment/Exercise - 08/16/16 0001      Neck Exercises: Supine   Neck Retraction 10 reps;5 secs     Shoulder Exercises: Standing   Extension Strengthening;Both;20 reps;Theraband   Theraband Level (Shoulder Extension) Level 3 (Green)   Row Strengthening;Both;20 reps;Theraband   Theraband Level (Shoulder Row) Level 3 (Green)   Retraction Strengthening;Both;20 reps   Theraband Level (Shoulder Retraction) Level 1 (Yellow)   Other Standing Exercises scap squeeze in doorway 10x10 sec     Shoulder Exercises: ROM/Strengthening   UBE (Upper Arm Bike) L 3.5 x 5 min  alt fwd/back     Shoulder Exercises: Stretch   Other Shoulder Stretches horizontal add stretch; shd flexion dropping hand behind head 2 each side each stretch 20 sec hold    Other Shoulder Stretches 3 way doorway stretch 30 sec x 3      Moist Heat Therapy   Number Minutes Moist Heat 20 Minutes   Moist Heat Location Shoulder;Cervical     Electrical Stimulation   Electrical Stimulation Location bilat cervical; Rt upper trap; Lt medial mid scap   Electrical Stimulation Action IFC   Electrical Stimulation Parameters to tolerance   Electrical Stimulation Goals Pain;Tone     Manual Therapy   Manual therapy comments prone and supine    Soft tissue mobilization bilat cervical paraspinals; ant/lat/posterior cervical musculature; upper traps; medial scapular and thoracic area    Myofascial Release cervical and upper trap      Neck Exercises: Stretches   Upper Trapezius Stretch 3 reps;20 seconds  sitting   Levator Stretch 3 reps;20 seconds          Trigger Point Dry Needling - 08/16/16 0902    Consent Given? Yes   Muscles Treated Upper Body Upper trapezius;Rhomboids;Longissimus  leveator bilat cervical; Lt lower trap - dec tightness    Upper  Trapezius Response Palpable increased muscle length;Twitch reponse elicited   Levator Scapulae Response Palpable increased muscle length;Twitch response elicited   Rhomboids Response Palpable increased muscle length;Twitch response elicited   Longissimus Response Palpable increased muscle length;Twitch response elicited              PT Education - 08/16/16 0904    Education provided Yes   Education Details HEP review    Person(s) Educated Patient   Methods Explanation;Demonstration;Tactile cues;Verbal cues   Comprehension Verbalized understanding;Returned demonstration;Verbal cues required             PT Long Term Goals - 08/16/16 0758      PT LONG TERM GOAL #1   Title independent with HEP for cervical dysfunction (09/27/16)    Time 6   Period Weeks   Status On-going     PT LONG TERM GOAL #2   Title Increase cervical ROM by 5-7 degrees in lateral cervical flexion 09/27/16   Time 6   Period Weeks   Status On-going     PT LONG TERM GOAL #3   Title Patient to report ability to work at desk during her typical work day with no neck pain 09/27/16   Time 6   Period Weeks   Status On-going     PT LONG TERM GOAL #4   Title Improve FOTO to </= 30% limitaion 09/27/16   Time 6   Period Weeks   Status On-going               Plan - 08/16/16 0957    Clinical Impression Statement Continued muscular tightness through the lateral and posterior cervical musculature and traps bilat as well as the lower traps Lt today. Responds well to TDN and manual work.   Rehab Potential Good   PT Frequency 2x / week   PT Duration 6 weeks   PT Treatment/Interventions Patient/family education;ADLs/Self Care Home Management;Cryotherapy;Electrical Stimulation;Iontophoresis 4mg /ml Dexamethasone;Moist Heat;Ultrasound;Traction;Dry needling;Manual techniques;Neuromuscular re-education;Therapeutic activities;Therapeutic exercise   PT Next Visit Plan posterior shoulder girdle strengthening; cervical stretching; manual work & TDN cervical musculature; modalities as indicated    Consulted and Agree with Plan of Care Patient      Patient will benefit from skilled therapeutic intervention in order to improve the following deficits and impairments:  Postural dysfunction, Improper body mechanics, Increased fascial restricitons, Increased muscle spasms, Pain, Decreased range of motion, Decreased mobility, Decreased strength, Decreased activity tolerance  Visit Diagnosis: Cervicalgia  Abnormal posture  Muscle weakness (generalized)  Other symptoms and signs involving the musculoskeletal system     Problem List Patient Active Problem List   Diagnosis Date Noted  . ANKLE SPRAIN, LEFT 04/10/2009  . ACUTE MAXILLARY SINUSITIS  11/06/2008  . URI 11/06/2008    Kennidi Yoshida Nilda Simmer PT, MPH  08/16/2016, 10:02 AM  [facility] [address]  [place], [place], [address] Phone: [phone]   Fax:  [phone]  Name: Danielle Braun MRN: [id number] Date of Birth: Dec 18, 1960

## 2016-08-20 ENCOUNTER — Ambulatory Visit: Payer: 59 | Admitting: Physical Therapy

## 2016-08-20 DIAGNOSIS — M6281 Muscle weakness (generalized): Secondary | ICD-10-CM

## 2016-08-20 DIAGNOSIS — R293 Abnormal posture: Secondary | ICD-10-CM

## 2016-08-20 DIAGNOSIS — M542 Cervicalgia: Secondary | ICD-10-CM | POA: Diagnosis not present

## 2016-08-20 DIAGNOSIS — G8929 Other chronic pain: Secondary | ICD-10-CM | POA: Diagnosis not present

## 2016-08-20 DIAGNOSIS — R29898 Other symptoms and signs involving the musculoskeletal system: Secondary | ICD-10-CM

## 2016-08-20 DIAGNOSIS — M25511 Pain in right shoulder: Secondary | ICD-10-CM | POA: Diagnosis not present

## 2016-08-20 DIAGNOSIS — J302 Other seasonal allergic rhinitis: Secondary | ICD-10-CM | POA: Diagnosis not present

## 2016-08-20 NOTE — Therapy (Signed)
[facility] [address]  [place], [place], [address] Phone: [phone]   Fax:  [phone]  Physical Therapy Treatment  Patient Details  Name: Danielle Braun MRN: [id number] Date of Birth: 12-11-1960 Referring Provider: Dr. Suella Broad / Levy Pupa, [place]  Encounter Date: 08/20/2016      PT End of Session - 08/20/16 1120    Visit Number 4   Number of Visits 12   Date for PT Re-Evaluation 09/27/16   PT Start Time 0930   PT Stop Time 1029   PT Time Calculation (min) 59 min   Activity Tolerance Patient tolerated treatment well   Behavior During Therapy [facility] for tasks assessed/performed      Past Medical History:  Diagnosis Date  . Acne   . Endometriosis   . GERD (gastroesophageal reflux disease)     Past Surgical History:  Procedure Laterality Date  . ABDOMINAL HYSTERECTOMY    . ABDOMINAL SURGERY     csection  . TONSILLECTOMY      There were no vitals filed for this visit.      Subjective Assessment - 08/20/16 0933    Subjective doing ok, past few days has been better.   Limitations Lifting;House hold activities   Patient Stated Goals improve pain, sleep through the night   Currently in Pain? Yes   Pain Score 2    Pain Location Neck   Pain Orientation Right;Distal;Posterior   Pain Descriptors / Indicators Dull;Tightness   Pain Type Chronic pain   Pain Onset More than a month ago   Pain Frequency Intermittent   Aggravating Factors  sitting at computer   Pain Relieving Factors exercises, ice                         [facility] Adult PT Treatment/Exercise - 08/20/16 0935      Neck Exercises: Standing   Neck Retraction 10 reps;10 secs   Neck Retraction Limitations against 1/2 foam roll   Other Standing Exercises horizontal abduction, er and flexion with red theraband against 1/2 foam roll x 15 each     Shoulder Exercises: ROM/Strengthening   UBE (Upper Arm Bike) L 3.5 x 6 min  (3' each direction)     Shoulder Exercises: Stretch   Other Shoulder Stretches 3 way doorway stretch 30 sec x 3      Moist Heat Therapy   Number Minutes Moist Heat 15 Minutes   Moist Heat Location Shoulder;Cervical     Electrical Stimulation   Electrical Stimulation Location bil cervical; bil UT   Electrical Stimulation Action IFC   Electrical Stimulation Parameters to tolerance   Electrical Stimulation Goals Pain;Tone     Manual Therapy   Manual therapy comments supine   Soft tissue mobilization bilat cervical paraspinals; ant/lat/posterior cervical musculature; upper traps; medial scapular and thoracic area    Myofascial Release cervical and upper trap      Neck Exercises: Stretches   Upper Trapezius Stretch 20 seconds;2 reps   Levator Stretch 20 seconds;2 reps                     PT Long Term Goals - 08/16/16 0758      PT LONG TERM GOAL #1   Title independent with HEP for cervical dysfunction (09/27/16)   Time 6   Period Weeks   Status On-going     PT LONG TERM GOAL #2   Title  Increase cervical ROM by 5-7 degrees in lateral cervical flexion 09/27/16   Time 6   Period Weeks   Status On-going     PT LONG TERM GOAL #3   Title Patient to report ability to work at desk during her typical work day with no neck pain 09/27/16   Time 6   Period Weeks   Status On-going     PT LONG TERM GOAL #4   Title Improve FOTO to </= 30% limitaion 09/27/16   Time 6   Period Weeks   Status On-going               Plan - 08/20/16 1121    Clinical Impression Statement Pt reports she has felt good for the past 3-4 days only c/o mild tightness and discomfort.  Pt tolerated session well today without increase in pain.  Will continue to benefit from PT to maximize function.   PT Treatment/Interventions Patient/family education;ADLs/Self Care Home Management;Cryotherapy;Electrical Stimulation;Iontophoresis 4mg /ml Dexamethasone;Moist Heat;Ultrasound;Traction;Dry  needling;Manual techniques;Neuromuscular re-education;Therapeutic activities;Therapeutic exercise   PT Next Visit Plan posterior shoulder girdle strengthening; cervical stretching; manual work & TDN cervical musculature; modalities as indicated    Consulted and Agree with Plan of Care Patient      Patient will benefit from skilled therapeutic intervention in order to improve the following deficits and impairments:  Postural dysfunction, Improper body mechanics, Increased fascial restricitons, Increased muscle spasms, Pain, Decreased range of motion, Decreased mobility, Decreased strength, Decreased activity tolerance  Visit Diagnosis: Cervicalgia  Abnormal posture  Muscle weakness (generalized)  Other symptoms and signs involving the musculoskeletal system     Problem List Patient Active Problem List   Diagnosis Date Noted  . ANKLE SPRAIN, LEFT 04/10/2009  . ACUTE MAXILLARY SINUSITIS 11/06/2008  . URI 11/06/2008       Danielle Braun, PT, DPT 08/20/16 11:24 AM    [facility] [address]  [place], [place], [address] Phone: [phone]   Fax:  [phone]  Name: Danielle Braun MRN: [id number] Date of Birth: 05-18-1961

## 2016-08-21 ENCOUNTER — Ambulatory Visit
Admission: RE | Admit: 2016-08-21 | Discharge: 2016-08-21 | Disposition: A | Discharge status: Home / Self Care | Payer: 59 | Source: Ambulatory Visit | Attending: Family Medicine | Admitting: Family Medicine

## 2016-08-21 DIAGNOSIS — Z1231 Encounter for screening mammogram for malignant neoplasm of breast: Secondary | ICD-10-CM | POA: Diagnosis not present

## 2016-08-23 ENCOUNTER — Ambulatory Visit: Payer: 59 | Admitting: Rehabilitative and Restorative Service Providers"

## 2016-08-23 ENCOUNTER — Encounter: Payer: Self-pay | Admitting: Rehabilitative and Restorative Service Providers"

## 2016-08-23 DIAGNOSIS — M542 Cervicalgia: Secondary | ICD-10-CM | POA: Diagnosis not present

## 2016-08-23 DIAGNOSIS — J302 Other seasonal allergic rhinitis: Secondary | ICD-10-CM | POA: Diagnosis not present

## 2016-08-23 DIAGNOSIS — R293 Abnormal posture: Secondary | ICD-10-CM | POA: Diagnosis not present

## 2016-08-23 DIAGNOSIS — M6281 Muscle weakness (generalized): Secondary | ICD-10-CM | POA: Diagnosis not present

## 2016-08-23 DIAGNOSIS — R29898 Other symptoms and signs involving the musculoskeletal system: Secondary | ICD-10-CM

## 2016-08-23 DIAGNOSIS — G8929 Other chronic pain: Secondary | ICD-10-CM | POA: Diagnosis not present

## 2016-08-23 DIAGNOSIS — M25511 Pain in right shoulder: Secondary | ICD-10-CM | POA: Diagnosis not present

## 2016-08-23 NOTE — Therapy (Signed)
Danielle Braun [address]  [place], [place], [address] Phone: [phone]   Fax:  [phone]  Physical Therapy Treatment  Patient Details  Name: Danielle Braun MRN: [id number] Date of Birth: [birth year] Referring Provider: Dr. Suella Broad / Danielle Braun, [place]  Encounter Date: 08/23/2016      PT End of Session - 08/23/16 1605    Visit Number 5   Number of Visits 12   Date for PT Re-Evaluation 09/27/16   PT Start Time [id number]   PT Stop Time 1657   PT Time Calculation (min) 52 min   Activity Tolerance Patient tolerated treatment well      Past Medical History:  Diagnosis Date  . Acne   . Endometriosis   . GERD (gastroesophageal reflux disease)     Past Surgical History:  Procedure Laterality Date  . ABDOMINAL HYSTERECTOMY    . ABDOMINAL SURGERY     csection  . TONSILLECTOMY      There were no vitals filed for this visit.      Subjective Assessment - 08/23/16 1606    Subjective Lt side of her neck is not bothering her at all. Still having some tightness in the Rt neck area. Some soreness following the TDN but felt better in a couple of days.    Currently in Pain? Yes   Pain Score 2    Pain Location Neck   Pain Orientation Right;Posterior   Pain Descriptors / Indicators Dull;Tightness   Pain Type Chronic pain   Pain Onset More than a month ago   Pain Frequency Intermittent   Aggravating Factors  sitting at computer    Pain Relieving Factors exercises; ice; TDN                          [facility] Adult PT Treatment/Exercise - 08/23/16 0001      Neck Exercises: Standing   Neck Retraction 10 reps;10 secs     Shoulder Exercises: ROM/Strengthening   UBE (Upper Arm Bike) L 3.5 x 6 min (3' each direction)     Shoulder Exercises: Stretch   Other Shoulder Stretches supine lying on swim noodle at T-spine 2 min for improving thoracic  extension    Other Shoulder Stretches 3 way doorway  stretch 30 sec x 3; biceps stretch in doorway 30 sec x 2      Moist Heat Therapy   Number Minutes Moist Heat 15 Minutes   Moist Heat Location Shoulder;Cervical     Electrical Stimulation   Electrical Stimulation Location Rt cervical; Rt thoracic paraspinals    Electrical Stimulation Action IFC   Electrical Stimulation Parameters to tolerance   Electrical Stimulation Goals Pain;Tone     Manual Therapy   Manual therapy comments prone   Soft tissue mobilization bilat cervical paraspinals; ant/lat/posterior cervical musculature; upper traps; medial scapular and thoracic area    Myofascial Release cervical and upper trap           Trigger Braun Dry Needling - 08/23/16 1654    Consent Given? Yes   Muscles Treated Upper Body --  Rt    Upper Trapezius Response Palpable increased muscle length   Levator Scapulae Response Palpable increased muscle length   Rhomboids Response Palpable increased muscle length   Longissimus Response Palpable increased muscle length  thoracic paraspinals Rt  PT Long Term Goals - 08/23/16 1605      PT LONG TERM GOAL #1   Title independent with HEP for cervical dysfunction (09/27/16)   Time 6   Period Weeks   Status On-going     PT LONG TERM GOAL #2   Title Increase cervical ROM by 5-7 degrees in lateral cervical flexion 09/27/16   Time 6   Period Weeks   Status On-going     PT LONG TERM GOAL #3   Title Patient to report ability to work at desk during her typical work day with no neck pain 09/27/16   Time 6   Period Weeks   Status On-going     PT LONG TERM GOAL #4   Title Improve FOTO to </= 30% limitaion 09/27/16   Time 6   Period Weeks   Status On-going               Plan - 08/23/16 1655    Clinical Impression Statement Continued improvement with decreased pain and tightness; decreased tenderness and tightness to palpation; improved tolerance of sitting at desk/computer. Good response to TDN and  manual work. Will re-asses at next visit to decrease frequency or decide if patient needs further PT.    Rehab Potential Good   PT Frequency 2x / week   PT Duration 6 weeks   PT Treatment/Interventions Patient/family education;ADLs/Self Care Home Management;Cryotherapy;Electrical Stimulation;Iontophoresis 4mg /ml Dexamethasone;Moist Heat;Ultrasound;Traction;Dry needling;Manual techniques;Neuromuscular re-education;Therapeutic activities;Therapeutic exercise   PT Next Visit Plan posterior shoulder girdle strengthening; cervical stretching; manual work & TDN cervical musculature; modalities as indicated    Consulted and Agree with Plan of Care Patient      Patient will benefit from skilled therapeutic intervention in order to improve the following deficits and impairments:  Postural dysfunction, Improper body mechanics, Increased fascial restricitons, Increased muscle spasms, Pain, Decreased range of motion, Decreased mobility, Decreased strength, Decreased activity tolerance  Visit Diagnosis: Cervicalgia  Abnormal posture  Muscle weakness (generalized)  Other symptoms and signs involving the musculoskeletal system     Problem List Patient Active Problem List   Diagnosis Date Noted  . ANKLE SPRAIN, LEFT 04/10/2009  . ACUTE MAXILLARY SINUSITIS 11/06/2008  . URI 11/06/2008    Danielle Braun Nilda Simmer PT, MPH  08/23/2016, 4:58 PM  [facility] [address]  [place], [place], [address] Phone: [phone]   Fax:  [phone]  Name: Danielle Braun MRN: [id number] Date of Birth: Mar 03, 1961

## 2016-08-27 ENCOUNTER — Encounter: Payer: 59 | Admitting: Physical Therapy

## 2016-08-28 ENCOUNTER — Ambulatory Visit: Payer: 59 | Admitting: Physical Therapy

## 2016-08-28 DIAGNOSIS — R293 Abnormal posture: Secondary | ICD-10-CM

## 2016-08-28 DIAGNOSIS — M6281 Muscle weakness (generalized): Secondary | ICD-10-CM | POA: Diagnosis not present

## 2016-08-28 DIAGNOSIS — R29898 Other symptoms and signs involving the musculoskeletal system: Secondary | ICD-10-CM | POA: Diagnosis not present

## 2016-08-28 DIAGNOSIS — M542 Cervicalgia: Secondary | ICD-10-CM

## 2016-08-28 DIAGNOSIS — G8929 Other chronic pain: Secondary | ICD-10-CM | POA: Diagnosis not present

## 2016-08-28 DIAGNOSIS — M25511 Pain in right shoulder: Secondary | ICD-10-CM | POA: Diagnosis not present

## 2016-08-28 DIAGNOSIS — J302 Other seasonal allergic rhinitis: Secondary | ICD-10-CM | POA: Diagnosis not present

## 2016-08-28 NOTE — Therapy (Addendum)
[facility] [address]  [place], [place], [address] Phone: [phone]   Fax:  [phone]  Physical Therapy Treatment  Patient Details  Name: Danielle Braun MRN: [id number] Date of Birth: 1961-05-23 Referring Provider: Dr. Suella Broad / Levy [place], [place]  Encounter Date: 08/28/2016      PT End of Session - 08/28/16 0910    Visit Number 6   Number of Visits 12   Date for PT Re-Evaluation 09/27/16   PT Start Time 0800   PT Stop Time 0831   PT Time Calculation (min) 31 min   Activity Tolerance Patient tolerated treatment well   Behavior During Therapy [facility] for tasks assessed/performed      Past Medical History:  Diagnosis Date  . Acne   . Endometriosis   . GERD (gastroesophageal reflux disease)     Past Surgical History:  Procedure Laterality Date  . ABDOMINAL HYSTERECTOMY    . ABDOMINAL SURGERY     csection  . TONSILLECTOMY      There were no vitals filed for this visit.      Subjective Assessment - 08/28/16 0806    Subjective having a little discomfort today.   Limitations Lifting;House hold activities   Patient Stated Goals improve pain, sleep through the night   Currently in Pain? Yes   Pain Score 2    Pain Location Neck   Pain Orientation Right;Posterior   Pain Descriptors / Indicators Dull;Tightness   Pain Type Chronic pain   Pain Onset More than a month ago   Pain Frequency Intermittent   Aggravating Factors  sitting at computer   Pain Relieving Factors exercises, ice, TDN            [facility] PT Assessment - 08/28/16 0811      Observation/Other Assessments   Focus on Therapeutic Outcomes (FOTO)  34% limited     AROM   Cervical - Right Side Bend 36   Cervical - Left Side Bend 35                     [facility] Adult PT Treatment/Exercise - 08/28/16 0811      Shoulder Exercises: Standing   Row Strengthening;Both;20 reps;Theraband   Theraband Level (Shoulder Row)  Level 4 (Blue)     Shoulder Exercises: ROM/Strengthening   UBE (Upper Arm Bike) L 3.5 x 6 min (3' each direction)     Shoulder Exercises: Stretch   Other Shoulder Stretches 3 way doorway stretch 30 sec x 3; biceps stretch in doorway 30 sec x 2      Neck Exercises: Stretches   Upper Trapezius Stretch 20 seconds;2 reps                     PT Long Term Goals - 08/28/16 0910      PT LONG TERM GOAL #1   Title independent with HEP for cervical dysfunction (09/27/16)   Status Achieved     PT LONG TERM GOAL #2   Title Increase cervical ROM by 5-7 degrees in lateral cervical flexion 09/27/16   Status Achieved     PT LONG TERM GOAL #3   Title Patient to report ability to work at desk during her typical work day with no neck pain 09/27/16   Status Achieved     PT LONG TERM GOAL #4   Title Improve FOTO to </= 30% limitaion 09/27/16   Status Not Met  Plan - 08/28/16 0911    Clinical Impression Statement Pt has met all goals except FOTO score still 34% limited.  Pt requesting to hold PT at this time as she feels ready to transition to HEP only and is back to daily activities without pain.  Will hold, pt to return if needed, otherwise will d/c.   PT Treatment/Interventions Patient/family education;ADLs/Self Care Home Management;Cryotherapy;Electrical Stimulation;Iontophoresis 10m/ml Dexamethasone;Moist Heat;Ultrasound;Traction;Dry needling;Manual techniques;Neuromuscular re-education;Therapeutic activities;Therapeutic exercise   PT Next Visit Plan hold PT, will need renewal if pt returns, if not d/c   Consulted and Agree with Plan of Care Patient      Patient will benefit from skilled therapeutic intervention in order to improve the following deficits and impairments:  Postural dysfunction, Improper body mechanics, Increased fascial restricitons, Increased muscle spasms, Pain, Decreased range of motion, Decreased mobility, Decreased strength, Decreased  activity tolerance  Visit Diagnosis: Cervicalgia  Abnormal posture     Problem List Patient Active Problem List   Diagnosis Date Noted  . ANKLE SPRAIN, LEFT 04/10/2009  . ACUTE MAXILLARY SINUSITIS 11/06/2008  . URI 11/06/2008       SLaureen Abrahams PT, DPT 08/28/16 9:18 AM    [facility] [place] [address]Phone: [phone]  Fax:  [phone] Name: Danielle MARONEYMRN: [id number] of Birth: [birth year]      PHYSICAL THERAPY DISCHARGE SUMMARY  Visits from Start of Care: 6  Current functional level related to goals / functional outcomes: See above   Remaining deficits: n/a   Education / Equipment: HEP, posture  Plan: Patient agrees to discharge.  Patient goals were partially met. Patient is being discharged due to being pleased with the current functional level.  ?????    SLaureen Abrahams PT, DPT 09/27/16 3:48 PM   [facility] [place] [address] [phone](office) [phone](fax)

## 2016-09-19 DIAGNOSIS — Z0000 Encounter for general adult medical examination without abnormal findings: Secondary | ICD-10-CM | POA: Diagnosis not present

## 2016-09-21 ENCOUNTER — Encounter: Payer: Self-pay | Admitting: Physical Therapy

## 2016-09-24 DIAGNOSIS — J302 Other seasonal allergic rhinitis: Secondary | ICD-10-CM | POA: Diagnosis not present

## 2016-09-24 DIAGNOSIS — Z0000 Encounter for general adult medical examination without abnormal findings: Secondary | ICD-10-CM | POA: Diagnosis not present

## 2016-09-24 DIAGNOSIS — K219 Gastro-esophageal reflux disease without esophagitis: Secondary | ICD-10-CM | POA: Diagnosis not present

## 2016-09-24 DIAGNOSIS — Z6829 Body mass index (BMI) 29.0-29.9, adult: Secondary | ICD-10-CM | POA: Diagnosis not present

## 2016-09-24 DIAGNOSIS — E663 Overweight: Secondary | ICD-10-CM | POA: Diagnosis not present

## 2016-09-26 MED FILL — raNITIdine HCL 150 MG TABS: 150 | 90 days supply | Qty: 180 | Fill #0

## 2016-09-27 ENCOUNTER — Encounter: Payer: Self-pay | Admitting: Physical Therapy

## 2016-12-21 MED FILL — FLUTICASONE PROP 50 MCG SPR: 50 | 90 days supply | Qty: 48 | Fill #0

## 2016-12-21 MED FILL — raNITIdine HCL 150 MG TABS: 150 | 90 days supply | Qty: 180 | Fill #1

## 2017-02-01 DIAGNOSIS — D2261 Melanocytic nevi of right upper limb, including shoulder: Secondary | ICD-10-CM | POA: Diagnosis not present

## 2017-02-01 DIAGNOSIS — B078 Other viral warts: Secondary | ICD-10-CM | POA: Diagnosis not present

## 2017-02-01 DIAGNOSIS — D2272 Melanocytic nevi of left lower limb, including hip: Secondary | ICD-10-CM | POA: Diagnosis not present

## 2017-02-01 DIAGNOSIS — D220 Melanocytic nevi of lip: Secondary | ICD-10-CM | POA: Diagnosis not present

## 2017-02-01 DIAGNOSIS — L821 Other seborrheic keratosis: Secondary | ICD-10-CM | POA: Diagnosis not present

## 2017-02-01 DIAGNOSIS — L918 Other hypertrophic disorders of the skin: Secondary | ICD-10-CM | POA: Diagnosis not present

## 2017-02-01 DIAGNOSIS — D2271 Melanocytic nevi of right lower limb, including hip: Secondary | ICD-10-CM | POA: Diagnosis not present

## 2017-02-01 DIAGNOSIS — D2262 Melanocytic nevi of left upper limb, including shoulder: Secondary | ICD-10-CM | POA: Diagnosis not present

## 2017-02-01 DIAGNOSIS — D225 Melanocytic nevi of trunk: Secondary | ICD-10-CM | POA: Diagnosis not present

## 2017-02-05 DIAGNOSIS — Z6826 Body mass index (BMI) 26.0-26.9, adult: Secondary | ICD-10-CM | POA: Diagnosis not present

## 2017-02-05 DIAGNOSIS — Z01419 Encounter for gynecological examination (general) (routine) without abnormal findings: Secondary | ICD-10-CM | POA: Diagnosis not present

## 2017-04-02 MED FILL — raNITIdine HCL 150 MG TABS: 150 | 90 days supply | Qty: 180 | Fill #2

## 2017-04-08 DIAGNOSIS — M10071 Idiopathic gout, right ankle and foot: Secondary | ICD-10-CM | POA: Diagnosis not present

## 2017-04-09 DIAGNOSIS — M109 Gout, unspecified: Secondary | ICD-10-CM | POA: Diagnosis not present

## 2017-04-15 DIAGNOSIS — M79671 Pain in right foot: Secondary | ICD-10-CM | POA: Diagnosis not present

## 2017-04-15 DIAGNOSIS — M19071 Primary osteoarthritis, right ankle and foot: Secondary | ICD-10-CM | POA: Diagnosis not present

## 2017-07-12 MED FILL — raNITIdine HCL 150 MG TABS: 150 | 90 days supply | Qty: 180 | Fill #3

## 2017-07-16 ENCOUNTER — Other Ambulatory Visit: Payer: Self-pay | Admitting: Family Medicine

## 2017-07-16 DIAGNOSIS — Z1231 Encounter for screening mammogram for malignant neoplasm of breast: Secondary | ICD-10-CM

## 2017-07-21 DIAGNOSIS — J0191 Acute recurrent sinusitis, unspecified: Secondary | ICD-10-CM | POA: Diagnosis not present

## 2017-07-21 DIAGNOSIS — R05 Cough: Secondary | ICD-10-CM | POA: Diagnosis not present

## 2017-07-21 DIAGNOSIS — M25512 Pain in left shoulder: Secondary | ICD-10-CM | POA: Diagnosis not present

## 2017-08-02 MED FILL — traMADol HCL 50 MG TABS: 50 | 20 days supply | Qty: 20 | Fill #0

## 2017-08-07 ENCOUNTER — Ambulatory Visit: Payer: PRIVATE HEALTH INSURANCE | Attending: Surgical | Admitting: Physical Therapy

## 2017-08-07 DIAGNOSIS — M25612 Stiffness of left shoulder, not elsewhere classified: Secondary | ICD-10-CM

## 2017-08-07 DIAGNOSIS — M6281 Muscle weakness (generalized): Secondary | ICD-10-CM

## 2017-08-07 DIAGNOSIS — R29898 Other symptoms and signs involving the musculoskeletal system: Secondary | ICD-10-CM

## 2017-08-07 DIAGNOSIS — M25512 Pain in left shoulder: Secondary | ICD-10-CM | POA: Diagnosis present

## 2017-08-07 DIAGNOSIS — R293 Abnormal posture: Secondary | ICD-10-CM | POA: Diagnosis present

## 2017-08-08 NOTE — Therapy (Signed)
[facility] [address]  [place], [place], [address] Phone: [phone]   Fax:  [phone]  Physical Therapy Evaluation  Patient Details  Name: Danielle Braun MRN: [id number] Date of Birth: Apr 18, 1961 Referring Provider: Grier Mitts, PA-C  Encounter Date: 08/07/2017      PT End of Session - 08/07/17 0930    Visit Number 1   Number of Visits 8   Date for PT Re-Evaluation 09/06/17   Authorization Type WC - 8 visits authorized   PT Start Time 0930   PT Stop Time 1019   PT Time Calculation (min) 49 min   Activity Tolerance Patient tolerated treatment well   Behavior During Therapy [facility] for tasks assessed/performed      Past Medical History:  Diagnosis Date  . Acne   . Endometriosis   . GERD (gastroesophageal reflux disease)     Past Surgical History:  Procedure Laterality Date  . ABDOMINAL HYSTERECTOMY    . ABDOMINAL SURGERY     csection  . TONSILLECTOMY      There were no vitals filed for this visit.       Subjective Assessment - 08/07/17 0939    Subjective Pt reports she got her flu shot 4 weeks ago (07/12/17). At the time, the RN administering the shot felt abnormal resistance. Over next few days, pt noting worsening pain and difficulty elevating her arm. Subsided for a few days, then ramped back up to the point where she was maxing out on ibuprofen dosing. Attempted exercises prescribed by MD, but worsening pain. Saw PA at ortho who gave her steroid injection on 08/02/17 and referred her for PT. Noted some relief the day folllowing the injection but pain returning the next day. Has noted better motion since the injection.   Patient Stated Goals "restore the function I had before"   Currently in Pain? Yes   Pain Score 2   up to 6/10 with movement   Pain Location Shoulder   Pain Orientation Left;Anterior;Lateral   Pain Descriptors / Indicators Aching;Sharp   Pain Type Acute pain   Pain  Onset 1 to 4 weeks ago   Pain Frequency Constant  but varies in intensity   Aggravating Factors  removing shirt overhead, reaching back for seatbelt, reaching across body   Pain Relieving Factors Ultram, heating pad   Effect of Pain on Daily Activities difficulty with heavy lifting on L; modifies how she lifts or moves things            [facility] PT Assessment - 08/07/17 0930      Assessment   Medical Diagnosis L rotator cuff syndrome   Referring Provider Grier Mitts, PA-C   Onset Date/Surgical Date 07/12/17   Hand Dominance Right   Next MD Visit 08/23/17   Prior Therapy PT for neck & R shoulder pain ~1 yr ago     Balance Screen   Has the patient fallen in the past 6 months No   Has the patient had a decrease in activity level because of a fear of falling?  No   Is the patient reluctant to leave their home because of a fear of falling?  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 reading; exercise bike  20 min/day most days; walking ~1.5 miles sporadically     ROM / Strength   AROM / PROM / Strength AROM;Strength     AROM   AROM Assessment Site Shoulder   Right/Left Shoulder Right;Left   Right Shoulder Flexion 152 Degrees   Right Shoulder ABduction 155 Degrees   Right Shoulder Internal Rotation --  FIR to mid scapula   Right Shoulder External Rotation --  FER to T2   Left Shoulder Flexion 145 Degrees   Left Shoulder ABduction 148 Degrees   Left Shoulder Internal Rotation --  FIR to mid scapula   Left Shoulder External Rotation --  FER to T1     Strength   Strength Assessment Site Shoulder   Right/Left Shoulder Right;Left   Right Shoulder Flexion 4+/5   Right Shoulder ABduction 4+/5   Right Shoulder Internal Rotation 4+/5   Right Shoulder External Rotation 4+/5    Left Shoulder Flexion 4-/5   Left Shoulder ABduction 4/5   Left Shoulder Internal Rotation 4/5   Left Shoulder External Rotation 4-/5     Palpation   Palpation comment ttp             Objective measurements completed on examination: See above findings.                  PT Education - 08/07/17 1015    Education provided Yes   Education Details PT eval findings, anticipated POC & initial HEP   Person(s) Educated Patient   Methods Explanation;Demonstration;Handout   Comprehension Verbalized understanding;Returned demonstration;Need further instruction             PT Long Term Goals - 08/07/17 1019      PT LONG TERM GOAL #1   Title Indepenent with ongoing HEP   Status New   Target Date 09/06/17     PT LONG TERM GOAL #2   Title L shoulder AROM equivalent to R w/o increased pain   Status New   Target Date 09/06/17     PT LONG TERM GOAL #3   Title L shoulder strength >/= 4/5 to 4+/5 w/o pain on resistance   Status New   Target Date 09/06/17     PT LONG TERM GOAL #4   Title Pt will report ability to complete ADL's, typical household chore and job tasks w/o restrictions due to L shoulder LOM, weakness or pain   Status New   Target Date 09/06/17                Plan - 08/07/17 1048    Clinical Impression Statement Danielle Braun is a 56 y/o female who present to OP PT for a worker's comp eval for L shoulder pain due to L rotator cuff syndrome resulting from flu vaccine injection on 07/12/17. Pt reports onset of pain and difficulty elevating L arm out of proportion with typical response to flu vaccine injection which continued to progress to the point where she was maxing out on ibuprofen dosing without relief of pain. Poor tolerance for exercises provided by MD, but some relief noted following cortisone injection received in MD office on 08/02/17. L shoulder ROM remains limited relative to R, primarily in overhead elevation with decreased RTC strength most  prominent in ER. Pt presents with slightly forward, rounded shoulder posture on L with increased muscle tension and ttp in L pecs and posterior capsule. Pain and weakness limit pt from reaching overhead or lifting heavy objects with L hand. Mylah demonstrates good potential to  benefit from skilled PT with POC focusing on reinforcing postural awareness, scapular strengthening/stabilization, L shoulder ROM, L shoulder/RTC strengthening, with manual therapy and modalities PRN for pain and normalization of muscle tension as indicated.   History and Personal Factors relevant to plan of care: PT ~1 yr ago for neck & R shoulder pain   Clinical Presentation Stable   Clinical Presentation due to: improving since cortisone injection   Clinical Decision Making Low   Rehab Potential Good   PT Frequency 2x / week   PT Duration 4 weeks   PT Treatment/Interventions Patient/family education;ADLs/Self Care Home Management;Neuromuscular re-education;Therapeutic exercise;Therapeutic activities;Manual techniques;Dry needling;Taping;Passive range of motion;Electrical Stimulation;Moist Heat;Cryotherapy;Vasopneumatic Device;Iontophoresis 4mg /ml Dexamethasone   Consulted and Agree with Plan of Care Patient      Patient will benefit from skilled therapeutic intervention in order to improve the following deficits and impairments:  Pain, Impaired flexibility, Decreased range of motion, Decreased strength, Increased muscle spasms, Postural dysfunction, Improper body mechanics, Impaired UE functional use, Decreased activity tolerance  Visit Diagnosis: Acute pain of left shoulder  Stiffness of left shoulder, not elsewhere classified  Abnormal posture  Muscle weakness (generalized)  Other symptoms and signs involving the musculoskeletal system     Problem List Patient Active Problem List   Diagnosis Date Noted  . ANKLE SPRAIN, LEFT 04/10/2009  . ACUTE MAXILLARY SINUSITIS 11/06/2008  . URI 11/06/2008     Percival Spanish, PT, MPT 08/08/2017, 12:03 PM  [facility] [address]  [place], [place], [address] Phone: [phone]   Fax:  [phone]  Name: CATTLEYA DOBRATZ MRN: [id number] Date of Birth: May 05, 1961

## 2017-08-12 DIAGNOSIS — H43393 Other vitreous opacities, bilateral: Secondary | ICD-10-CM | POA: Diagnosis not present

## 2017-08-12 DIAGNOSIS — H52203 Unspecified astigmatism, bilateral: Secondary | ICD-10-CM | POA: Diagnosis not present

## 2017-08-12 DIAGNOSIS — H524 Presbyopia: Secondary | ICD-10-CM | POA: Diagnosis not present

## 2017-08-12 DIAGNOSIS — H16223 Keratoconjunctivitis sicca, not specified as Sjogren's, bilateral: Secondary | ICD-10-CM | POA: Diagnosis not present

## 2017-08-13 ENCOUNTER — Ambulatory Visit: Payer: PRIVATE HEALTH INSURANCE

## 2017-08-13 DIAGNOSIS — M25612 Stiffness of left shoulder, not elsewhere classified: Secondary | ICD-10-CM

## 2017-08-13 DIAGNOSIS — R293 Abnormal posture: Secondary | ICD-10-CM

## 2017-08-13 DIAGNOSIS — M6281 Muscle weakness (generalized): Secondary | ICD-10-CM

## 2017-08-13 DIAGNOSIS — M25512 Pain in left shoulder: Secondary | ICD-10-CM

## 2017-08-13 DIAGNOSIS — R29898 Other symptoms and signs involving the musculoskeletal system: Secondary | ICD-10-CM

## 2017-08-13 NOTE — Therapy (Addendum)
[facility] [address]  [place], [place], [address] Phone: [phone]   Fax:  [phone]  Physical Therapy Treatment  Patient Details  Name: Danielle Braun MRN: [id number] Date of Birth: Jun 06, 1961 Referring Provider: Grier Mitts, PA-C  Encounter Date: 08/13/2017      PT End of Session - 08/13/17 1106    Visit Number 2   Number of Visits 8   Date for PT Re-Evaluation 09/06/17   Authorization Type WC - 8 visits authorized   PT Start Time 1102   PT Stop Time 1148   PT Time Calculation (min) 46 min   Activity Tolerance Patient tolerated treatment well   Behavior During Therapy [facility] for tasks assessed/performed      Past Medical History:  Diagnosis Date  . Acne   . Endometriosis   . GERD (gastroesophageal reflux disease)     Past Surgical History:  Procedure Laterality Date  . ABDOMINAL HYSTERECTOMY    . ABDOMINAL SURGERY     csection  . TONSILLECTOMY      There were no vitals filed for this visit.      Subjective Assessment - 08/13/17 1106    Subjective Pt. noting she feels like her ROM has "gotten worse" at this point now that the injection is "wearing off".     Patient Stated Goals "restore the function I had before"   Currently in Pain? Yes   Pain Score 7    Pain Location Shoulder   Pain Orientation Left;Anterior;Lateral   Pain Descriptors / Indicators Aching;Sharp   Pain Type Acute pain   Pain Onset 1 to 4 weeks ago   Pain Frequency Constant   Pain Relieving Factors Ultram, heating pad    Multiple Pain Sites No                         [facility] Adult PT Treatment/Exercise - 08/13/17 1130      Shoulder Exercises: Supine   External Rotation 10 reps;AAROM   External Rotation Limitations wand    Flexion 10 reps;AAROM;Both   Flexion Limitations wand     Shoulder Exercises: Seated   Abduction 10 reps;AAROM;Left   ABduction Limitations wand      Shoulder  Exercises: Standing   Extension 10 reps;Theraband  3" hold   Theraband Level (Shoulder Extension) Level 1 (Yellow)   Extension Limitations tactile cues for scapular   less resistance due to pt. report of pain with red TB   Row 10 reps;Theraband   Theraband Level (Shoulder Row) Level 1 (Yellow)   Retraction 10 reps;Both   Retraction Limitations 5" hold at doorseal for tactile cue to encouraged increased motion      Shoulder Exercises: Stretch   Corner Stretch 2 reps;30 seconds   Corner Stretch Limitations low, mid, high version; doorway stretch   Other Shoulder Stretches Chest stretch laying on pool noodle x 1 in; ~ 70 dg abduction position      Manual Therapy   Manual Therapy Soft tissue mobilization   Manual therapy comments supine on pool noodle    Soft tissue mobilization STM to L shoulder complex                 PT Education - 08/13/17 1253    Education provided Yes   Education Details Wand AAROM ER , flexion, abduction    Person(s) Educated Patient   Methods Explanation;Demonstration;Verbal cues;Handout   Comprehension Verbalized understanding;Returned demonstration;Verbal  cues required;Need further instruction             PT Long Term Goals - 08/13/17 1110      PT LONG TERM GOAL #1   Title Indepenent with ongoing HEP   Status On-going     PT LONG TERM GOAL #2   Title L shoulder AROM equivalent to R w/o increased pain   Status On-going     PT LONG TERM GOAL #3   Title L shoulder strength >/= 4/5 to 4+/5 w/o pain on resistance   Status On-going     PT LONG TERM GOAL #4   Title Pt will report ability to complete ADL's, typical household chore and job tasks w/o restrictions due to L shoulder LOM, weakness or pain   Status On-going               Plan - 08/13/17 1112    Clinical Impression Statement [name] Doing well today however noting some ROM decrease over weekend without known trigger.  Treatment focusing on HEP review with pt. requiring  cueing for scapular retraction.  Wand ROM activities issued to pt. via handout to maintain motion at L shoulder.  Pt. reporting some pain with red TB resisted activities today thus yellow TB issued to pt. for improved comfort.     PT Treatment/Interventions Patient/family education;ADLs/Self Care Home Management;Neuromuscular re-education;Therapeutic exercise;Therapeutic activities;Manual techniques;Dry needling;Taping;Passive range of motion;Electrical Stimulation;Moist Heat;Cryotherapy;Vasopneumatic Device;Iontophoresis 4mg /ml Dexamethasone      Patient will benefit from skilled therapeutic intervention in order to improve the following deficits and impairments:  Pain, Impaired flexibility, Decreased range of motion, Decreased strength, Increased muscle spasms, Postural dysfunction, Improper body mechanics, Impaired UE functional use, Decreased activity tolerance  Visit Diagnosis: Acute pain of left shoulder  Stiffness of left shoulder, not elsewhere classified  Abnormal posture  Muscle weakness (generalized)  Other symptoms and signs involving the musculoskeletal system     Problem List Patient Active Problem List   Diagnosis Date Noted  . ANKLE SPRAIN, LEFT 04/10/2009  . ACUTE MAXILLARY SINUSITIS 11/06/2008  . URI 11/06/2008    Bess Harvest, PTA 08/13/17 12:55 PM  [facility] [address]  [place], [place], [address] Phone: [phone]   Fax:  [phone]  Name: Danielle Braun MRN: [id number] Date of Birth: 1961/10/26

## 2017-08-16 ENCOUNTER — Ambulatory Visit: Payer: PRIVATE HEALTH INSURANCE | Admitting: Physical Therapy

## 2017-08-16 DIAGNOSIS — M25512 Pain in left shoulder: Secondary | ICD-10-CM | POA: Diagnosis not present

## 2017-08-16 DIAGNOSIS — M6281 Muscle weakness (generalized): Secondary | ICD-10-CM

## 2017-08-16 DIAGNOSIS — M25612 Stiffness of left shoulder, not elsewhere classified: Secondary | ICD-10-CM

## 2017-08-16 DIAGNOSIS — R29898 Other symptoms and signs involving the musculoskeletal system: Secondary | ICD-10-CM

## 2017-08-16 DIAGNOSIS — R293 Abnormal posture: Secondary | ICD-10-CM

## 2017-08-16 NOTE — Therapy (Signed)
[facility] [address]  [place], [place], [address] Phone: [phone]   Fax:  [phone]  Physical Therapy Treatment  Patient Details  Name: Danielle Braun MRN: [id number] Date of Birth: 05-May-1961 Referring Provider: Grier Mitts, PA-C  Encounter Date: 08/16/2017      PT End of Session - 08/16/17 0842    Visit Number 3   Number of Visits 8   Date for PT Re-Evaluation 09/06/17   Authorization Type WC - 8 visits authorized   PT Start Time 0842   PT Stop Time 0943   PT Time Calculation (min) 61 min   Activity Tolerance Patient tolerated treatment well   Behavior During Therapy [facility] for tasks assessed/performed      Past Medical History:  Diagnosis Date  . Acne   . Endometriosis   . GERD (gastroesophageal reflux disease)     Past Surgical History:  Procedure Laterality Date  . ABDOMINAL HYSTERECTOMY    . ABDOMINAL SURGERY     csection  . TONSILLECTOMY      There were no vitals filed for this visit.      Subjective Assessment - 08/16/17 0843    Subjective Pt reporting constant ache at 2/10, with pain up to 9/39 with certain movements.   Patient Stated Goals "restore the function I had before"   Currently in Pain? Yes   Pain Score 2   up to 0/30 with certain movements   Pain Location Shoulder   Pain Orientation Left;Anterior;Lateral   Pain Descriptors / Indicators Constant;Aching;Sharp   Pain Type Acute pain                         [facility] Adult PT Treatment/Exercise - 08/16/17 0842      Self-Care   Self-Care Other Self-Care Comments   Other Self-Care Comments  instructed in self-STM/release with small ball on wall     Shoulder Exercises: Supine   Horizontal ABduction Both;15 reps;Theraband;Strengthening   Theraband Level (Shoulder Horizontal ABduction) Level 2 (Red)   Horizontal ABduction Limitations hooklying on 1/2 FR   External Rotation Both;5  reps;Theraband;Strengthening   Theraband Level (Shoulder External Rotation) Level 1 (Yellow)   External Rotation Limitations deferred d/t increased L shoulder/upper arm pain; hooklying on 1/2 FR   Flexion Left;AAROM;10 reps   Flexion Limitations wand; hooklying on 1/2 FR - cues for scap retraction & depression   Other Supine Exercises scap retraction + alt shoulder flex + opp shoulder ext with yellow TB x10; hooklying on 1/2 FR     Shoulder Exercises: ROM/Strengthening   UBE (Upper Arm Bike) lvl 1.0 fwd/back x 2' each     Shoulder Exercises: Stretch   Other Shoulder Stretches Hooklying pec stretch over 1/2 FR x2' (varying arm position)     Modalities   Modalities Electrical Stimulation;Moist Heat     Moist Heat Therapy   Number Minutes Moist Heat 15 Minutes   Moist Heat Location Shoulder     Electrical Stimulation   Electrical Stimulation Location L shoulder complex   Electrical Stimulation Action IFC   Electrical Stimulation Parameters 80-150 Hz, intensity to pt tol x15'   Electrical Stimulation Goals Pain;Tone     Manual Therapy   Manual Therapy Soft tissue mobilization;Myofascial release;Passive ROM   Manual therapy comments pt supine   Soft tissue mobilization STM to L pecs, biceps & posterior capsule, esp teres group   Myofascial Release instructed pt in self-release  for pecs & posterior capsule with small ball on wall   Passive ROM full PROM in all planes                     PT Long Term Goals - 08/13/17 1110      PT LONG TERM GOAL #1   Title Indepenent with ongoing HEP   Status On-going     PT LONG TERM GOAL #2   Title L shoulder AROM equivalent to R w/o increased pain   Status On-going     PT LONG TERM GOAL #3   Title L shoulder strength >/= 4/5 to 4+/5 w/o pain on resistance   Status On-going     PT LONG TERM GOAL #4   Title Pt will report ability to complete ADL's, typical household chore and job tasks w/o restrictions due to L shoulder LOM,  weakness or pain   Status On-going               Plan - 08/16/17 0845    Clinical Impression Statement Pt reporting feeling like benefit from cortisone injection was short-lived with return of increased pain and ROM restriction in less than a week. Increased muscle tension persists in L pec major, biceps & posterior capsule, esp teres group, therefore targeted STM/MFR to these muscle groups and instructed pt in self-STM with small ball on wall for home. Pt reporting most discomfort with attempts at L shoudler protraction and resisted ER today, but able to demonstrate full P/AAROM of L shoulder in all planes.   Rehab Potential Good   PT Treatment/Interventions Patient/family education;ADLs/Self Care Home Management;Neuromuscular re-education;Therapeutic exercise;Therapeutic activities;Manual techniques;Dry needling;Taping;Passive range of motion;Electrical Stimulation;Moist Heat;Cryotherapy;Vasopneumatic Device;Iontophoresis 4mg /ml Dexamethasone   Consulted and Agree with Plan of Care Patient      Patient will benefit from skilled therapeutic intervention in order to improve the following deficits and impairments:  Pain, Impaired flexibility, Decreased range of motion, Decreased strength, Increased muscle spasms, Postural dysfunction, Improper body mechanics, Impaired UE functional use, Decreased activity tolerance  Visit Diagnosis: Acute pain of left shoulder  Stiffness of left shoulder, not elsewhere classified  Abnormal posture  Muscle weakness (generalized)  Other symptoms and signs involving the musculoskeletal system     Problem List Patient Active Problem List   Diagnosis Date Noted  . ANKLE SPRAIN, LEFT 04/10/2009  . ACUTE MAXILLARY SINUSITIS 11/06/2008  . URI 11/06/2008    Percival Spanish, PT, MPT 08/16/2017, 10:00 AM  [facility] [address]  [place], [place], [address] Phone: [phone]   Fax:   [phone]  Name: Danielle Braun MRN: [id number] Date of Birth: 1960/12/27

## 2017-08-20 ENCOUNTER — Ambulatory Visit: Payer: PRIVATE HEALTH INSURANCE | Admitting: Physical Therapy

## 2017-08-20 DIAGNOSIS — R293 Abnormal posture: Secondary | ICD-10-CM

## 2017-08-20 DIAGNOSIS — M25612 Stiffness of left shoulder, not elsewhere classified: Secondary | ICD-10-CM

## 2017-08-20 DIAGNOSIS — M6281 Muscle weakness (generalized): Secondary | ICD-10-CM

## 2017-08-20 DIAGNOSIS — M25512 Pain in left shoulder: Secondary | ICD-10-CM

## 2017-08-20 DIAGNOSIS — R29898 Other symptoms and signs involving the musculoskeletal system: Secondary | ICD-10-CM

## 2017-08-20 NOTE — Therapy (Signed)
[facility] [address]  [place], [place], [address] Phone: [phone]   Fax:  [phone]  Physical Therapy Treatment  Patient Details  Name: Danielle Braun MRN: [id number] Date of Birth: December 10, 1960 Referring Provider: Grier Mitts, PA-C  Encounter Date: 08/20/2017      PT End of Session - 08/20/17 0758    Visit Number 4   Number of Visits 8   Date for PT Re-Evaluation 09/06/17   Authorization Type WC - 8 visits authorized   PT Start Time 0758   PT Stop Time 0859   PT Time Calculation (min) 61 min   Activity Tolerance Patient tolerated treatment well   Behavior During Therapy [facility] for tasks assessed/performed      Past Medical History:  Diagnosis Date  . Acne   . Endometriosis   . GERD (gastroesophageal reflux disease)     Past Surgical History:  Procedure Laterality Date  . ABDOMINAL HYSTERECTOMY    . ABDOMINAL SURGERY     csection  . TONSILLECTOMY      There were no vitals filed for this visit.      Subjective Assessment - 08/20/17 0800    Subjective Pt reporting she tried stopping taking the Ultram over the weekend, but reports pain increased as of yesterday, so took it again last night.   Patient Stated Goals "restore the function I had before"   Currently in Pain? Yes   Pain Score 6    Pain Location Shoulder   Pain Orientation Left;Anterior;Lateral   Pain Descriptors / Indicators Patsi Sears Adult PT Treatment/Exercise - 08/20/17 0758      Exercises   Exercises Shoulder     Shoulder Exercises: Prone   Extension Left;10 reps   Extension Limitations + scap retraction; prone over edge of mat table     Shoulder Exercises: Standing   Extension Both;10 reps;Theraband;Strengthening   Theraband Level (Shoulder Extension) Level 1 (Yellow)   Extension Limitations emphasis on scap retraction & depression   Row Both;10  reps;Theraband;Strengthening   Theraband Level (Shoulder Row) Level 1 (Yellow)   Row Limitations emphasis on scap retraction & depression   Other Standing Exercises L shooulder depression - yellow TB x 10     Shoulder Exercises: ROM/Strengthening   UBE (Upper Arm Bike) lvl 2.0 fwd/back x 2' each   Other ROM/Strengthening Exercises BATCA pulldown 10# x15     Modalities   Modalities Electrical Stimulation;Cryotherapy     Cryotherapy   Number Minutes Cryotherapy 15 Minutes   Cryotherapy Location Shoulder   Type of Cryotherapy Ice pack     Electrical Stimulation   Electrical Stimulation Location L shoulder complex   Electrical Stimulation Action IFC   Electrical Stimulation Parameters 80-150 Hz, intensity to pt tol x15'   Electrical Stimulation Goals Pain;Tone     Manual Therapy   Manual Therapy Soft tissue mobilization;Myofascial release;Passive ROM   Manual therapy comments pt supine   Soft tissue mobilization STM to L pecs, biceps & posterior capsule, esp teres group - emphasis on biceps   Myofascial Release instructed pt in self-release for pecs & posterior capsule with small ball on wall   Passive ROM full PROM in all planes                     PT Long  Term Goals - 08/13/17 1110      PT LONG TERM GOAL #1   Title Indepenent with ongoing HEP   Status On-going     PT LONG TERM GOAL #2   Title L shoulder AROM equivalent to R w/o increased pain   Status On-going     PT LONG TERM GOAL #3   Title L shoulder strength >/= 4/5 to 4+/5 w/o pain on resistance   Status On-going     PT LONG TERM GOAL #4   Title Pt will report ability to complete ADL's, typical household chore and job tasks w/o restrictions due to L shoulder LOM, weakness or pain   Status On-going               Plan - 08/20/17 0802    Clinical Impression Statement [name] noting increase in pain since stopping the Ultram on Thurs night, and started it back last night. Pain today primarily in  biceps and anterior shoulder, but notes yesterday it was worse in posterior shoulder. Taut band and TP noted in proximal biceps today, with some relief noted from manual therapy. Pt may benefit from ionto to this area, but POC yet to be signed by MD, therefore continued estim to promote further pain relief.   Rehab Potential Good   PT Treatment/Interventions Patient/family education;ADLs/Self Care Home Management;Neuromuscular re-education;Therapeutic exercise;Therapeutic activities;Manual techniques;Dry needling;Taping;Passive range of motion;Electrical Stimulation;Moist Heat;Cryotherapy;Vasopneumatic Device;Iontophoresis 4mg /ml Dexamethasone   Consulted and Agree with Plan of Care Patient      Patient will benefit from skilled therapeutic intervention in order to improve the following deficits and impairments:  Pain, Impaired flexibility, Decreased range of motion, Decreased strength, Increased muscle spasms, Postural dysfunction, Improper body mechanics, Impaired UE functional use, Decreased activity tolerance  Visit Diagnosis: Acute pain of left shoulder  Stiffness of left shoulder, not elsewhere classified  Abnormal posture  Muscle weakness (generalized)  Other symptoms and signs involving the musculoskeletal system     Problem List Patient Active Problem List   Diagnosis Date Noted  . ANKLE SPRAIN, LEFT 04/10/2009  . ACUTE MAXILLARY SINUSITIS 11/06/2008  . URI 11/06/2008    Percival Spanish, PT, MPT 08/20/2017, 9:08 AM  [facility] [address]  [place], [place], [address] Phone: [phone]   Fax:  [phone]  Name: Danielle Braun MRN: [id number] Date of Birth: 1961-04-18

## 2017-08-22 ENCOUNTER — Ambulatory Visit: Payer: PRIVATE HEALTH INSURANCE

## 2017-08-22 DIAGNOSIS — R293 Abnormal posture: Secondary | ICD-10-CM

## 2017-08-22 DIAGNOSIS — M25612 Stiffness of left shoulder, not elsewhere classified: Secondary | ICD-10-CM

## 2017-08-22 DIAGNOSIS — M6281 Muscle weakness (generalized): Secondary | ICD-10-CM

## 2017-08-22 DIAGNOSIS — M25512 Pain in left shoulder: Secondary | ICD-10-CM | POA: Diagnosis not present

## 2017-08-22 DIAGNOSIS — R29898 Other symptoms and signs involving the musculoskeletal system: Secondary | ICD-10-CM

## 2017-08-22 NOTE — Patient Instructions (Signed)

## 2017-08-22 NOTE — Therapy (Signed)
[facility] [address]  [place], [place], [address] Phone: [phone]   Fax:  [phone]  Physical Therapy Treatment  Patient Details  Name: LUDENE STOKKE MRN: [id number] Date of Birth: Jun 02, 1961 Referring Provider: Grier Mitts, PA-C  Encounter Date: 08/22/2017      PT End of Session - 08/22/17 0856    Visit Number 5   Number of Visits 8   Date for PT Re-Evaluation 09/06/17   Authorization Type WC - 8 visits authorized   PT Start Time 0847   PT Stop Time 0940   PT Time Calculation (min) 53 min   Activity Tolerance Patient tolerated treatment well   Behavior During Therapy [facility] for tasks assessed/performed      Past Medical History:  Diagnosis Date  . Acne   . Endometriosis   . GERD (gastroesophageal reflux disease)     Past Surgical History:  Procedure Laterality Date  . ABDOMINAL HYSTERECTOMY    . ABDOMINAL SURGERY     csection  . TONSILLECTOMY      There were no vitals filed for this visit.      Subjective Assessment - 08/22/17 0850    Subjective Pt. reporting resting pain levels have increased since yesterday without known trigger.     Patient Stated Goals "restore the function I had before"   Currently in Pain? Yes   Pain Score 3    Pain Location Shoulder   Pain Orientation Left;Anterior   Pain Descriptors / Indicators Aching  sharp pain with "certain movements"   Pain Radiating Towards non    Pain Onset More than a month ago   Aggravating Factors  reaching across, reaching back for seatbelt, twisting doornob   Pain Relieving Factors heating pad, Ultram    Multiple Pain Sites No            [facility] PT Assessment - 08/22/17 0910      AROM   AROM Assessment Site Shoulder   Right/Left Shoulder Right;Left   Right Shoulder Flexion 152 Degrees   Right Shoulder ABduction 155 Degrees   Right Shoulder Internal Rotation --  FIR to mid scapula    Right Shoulder External  Rotation --  FER to T2   Left Shoulder Flexion 161 Degrees  pain from 130-150 dg ROM   Left Shoulder ABduction 149 Degrees   Left Shoulder Internal Rotation --  FIR to mid scapula   Left Shoulder External Rotation --  FER to T4      Strength   Strength Assessment Site Shoulder   Right/Left Shoulder Right;Left   Right Shoulder Flexion 4+/5   Right Shoulder ABduction 4+/5   Right Shoulder Internal Rotation 4+/5   Right Shoulder External Rotation 4+/5   Left Shoulder Flexion 4/5   Left Shoulder ABduction 4-/5  pain with resistance    Left Shoulder Internal Rotation 4+/5   Left Shoulder External Rotation 3+/5  pain with resistance                      [facility] Adult PT Treatment/Exercise - 08/22/17 0950      Shoulder Exercises: Standing   Flexion AAROM;10 reps;Left   Flexion Limitations p-ball on wall    ABduction AAROM;10 reps;Left   ABduction Limitations scaption, p-ball on wall      Shoulder Exercises: ROM/Strengthening   UBE (Upper Arm Bike) lvl 2.0 fwd/back x 2' each     Modalities   Modalities Iontophoresis  Moist Heat Therapy   Number Minutes Moist Heat 10 Minutes   Moist Heat Location Shoulder     Electrical Stimulation   Electrical Stimulation Location L shoulder complex   Electrical Stimulation Action IFC   Electrical Stimulation Parameters 80-150Hz , intensity to pt. tolerance, 10'   Electrical Stimulation Goals Pain;Tone     Iontophoresis   Type of Iontophoresis Dexamethasone   Location L anterior shoulder    Dose 43m-min, 1.0 ml    Time 4-6 hr wear time     Manual Therapy   Manual Therapy Soft tissue mobilization;Myofascial release;Passive ROM   Manual therapy comments R sidelying    Soft tissue mobilization STM to L pecs, L posterior shoulder    Myofascial Release TPR to L mid bicep over area of tenderness                 PT Education - 08/22/17 0948    Education provided Yes   Education Details iontophoresis education  handout including precuations/contraindications   Person(s) Educated Patient   Methods Explanation;Verbal cues;Handout   Comprehension Verbalized understanding;Verbal cues required             PT Long Term Goals - 08/22/17 0932      PT LONG TERM GOAL #1   Title Indepenent with ongoing HEP   Status On-going     PT LONG TERM GOAL #2   Title L shoulder AROM equivalent to R w/o increased pain   Status Partially Met     PT LONG TERM GOAL #3   Title L shoulder strength >/= 4/5 to 4+/5 w/o pain on resistance   Status Partially Met     PT LONG TERM GOAL #4   Title Pt will report ability to complete ADL's, typical household chore and job tasks w/o restrictions due to L shoulder LOM, weakness or pain   Status On-going               Plan - 08/22/17 1240    Clinical Impression Statement Pt. seen today with primary complaint of worsening L shoulder pain over last few days without known trigger in resting positions.  CJenny Reichmannable to demonstrate some improvement in AROM flexion and functional ER today since initial measurements at eval., however with report of increased pain levels in overhead ranges.  Demonstrating decreased strength in abduction and ER strength testing, likely due to increased pain levels with resistance.  CJenny Reichmannstill reporting limitation due to pain with dressing, reaching for seat belt in car, and reaching across body.  Iontophoresis signed by MD thus educational handout issued to pt. with pt. verbalizing understanding of contraindications/precautions and wear-time.  Ionto patch #1/6 applied to L anterior shoulder/biceps over area of most tenderness.  Will monitor response to this in upcoming visit.   PT Treatment/Interventions Patient/family education;ADLs/Self Care Home Management;Neuromuscular re-education;Therapeutic exercise;Therapeutic activities;Manual techniques;Dry needling;Taping;Passive range of motion;Electrical Stimulation;Moist Heat;Cryotherapy;Vasopneumatic  Device;Iontophoresis 487mml Dexamethasone      Patient will benefit from skilled therapeutic intervention in order to improve the following deficits and impairments:  Pain, Impaired flexibility, Decreased range of motion, Decreased strength, Increased muscle spasms, Postural dysfunction, Improper body mechanics, Impaired UE functional use, Decreased activity tolerance  Visit Diagnosis: Acute pain of left shoulder  Stiffness of left shoulder, not elsewhere classified  Abnormal posture  Muscle weakness (generalized)  Other symptoms and signs involving the musculoskeletal system     Problem List Patient Active Problem List   Diagnosis Date Noted  . ANKLE SPRAIN, LEFT 04/10/2009  .  ACUTE MAXILLARY SINUSITIS 11/06/2008  . URI 11/06/2008    Bess Harvest, PTA 08/22/17 12:52 PM  [facility] [address]  [place], [place], [address] Phone: [phone]   Fax:  [phone]  Name: HAIZLEE HENTON MRN: [id number] Date of Birth: 06-01-1961

## 2017-08-23 ENCOUNTER — Other Ambulatory Visit (HOSPITAL_COMMUNITY): Payer: Self-pay | Admitting: Orthopedic Surgery

## 2017-08-23 ENCOUNTER — Ambulatory Visit
Admission: RE | Admit: 2017-08-23 | Discharge: 2017-08-23 | Disposition: A | Discharge status: Home / Self Care | Payer: 59 | Source: Ambulatory Visit | Attending: Family Medicine | Admitting: Family Medicine

## 2017-08-23 DIAGNOSIS — M67912 Unspecified disorder of synovium and tendon, left shoulder: Secondary | ICD-10-CM

## 2017-08-23 DIAGNOSIS — Z1231 Encounter for screening mammogram for malignant neoplasm of breast: Secondary | ICD-10-CM

## 2017-08-23 MED FILL — traMADol HCL 50 MG TABS: 50 | 10 days supply | Qty: 30 | Fill #0

## 2017-08-23 MED FILL — DICLOFENAC SODIUM 1% GEL: 1 | 25 days supply | Qty: 300 | Fill #0

## 2017-08-24 IMAGING — MG 2D DIGITAL SCREENING BILATERAL MAMMOGRAM WITH CAD AND ADJUNCT TO
8 of 12 series · 8 of 28 positions shown · non-contrast
Comparison: Previous exam(s).

CLINICAL DATA: Screening.

EXAM:
2D DIGITAL SCREENING BILATERAL MAMMOGRAM WITH CAD AND ADJUNCT TOMO

[R MLO synth-2D]
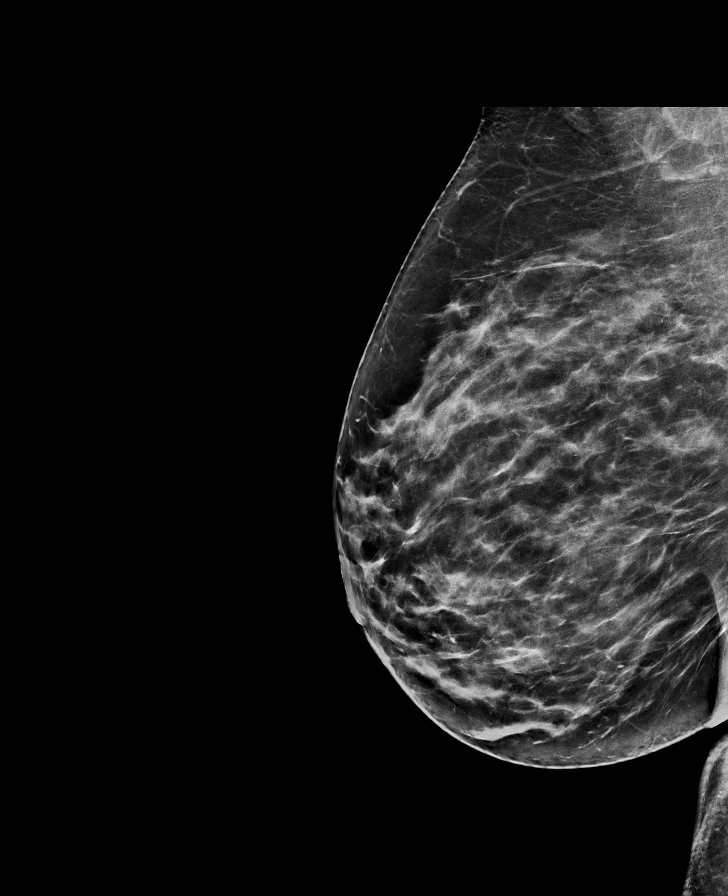

[L CC]
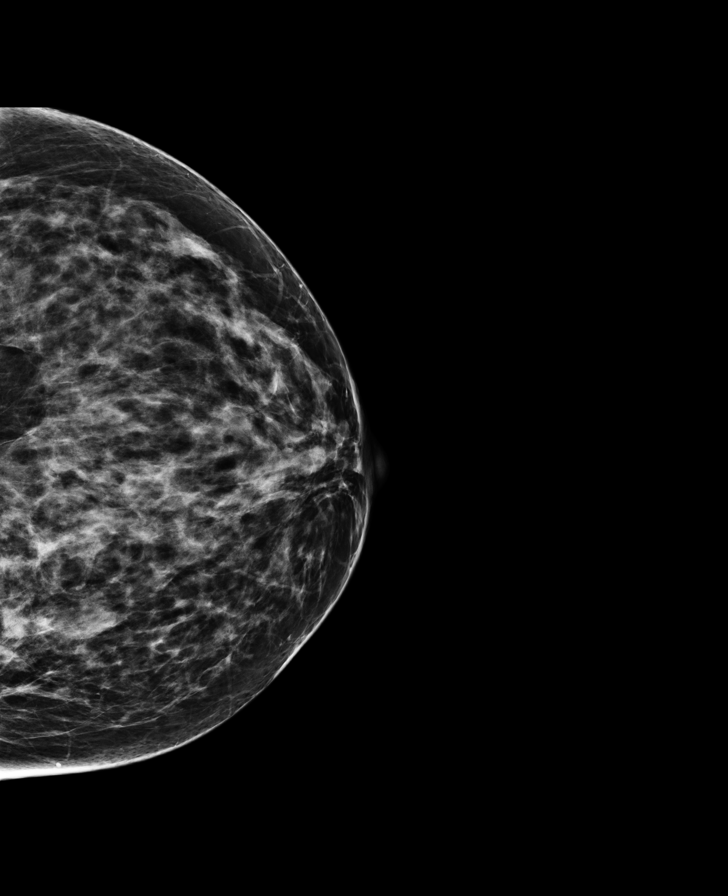

[R MLO]
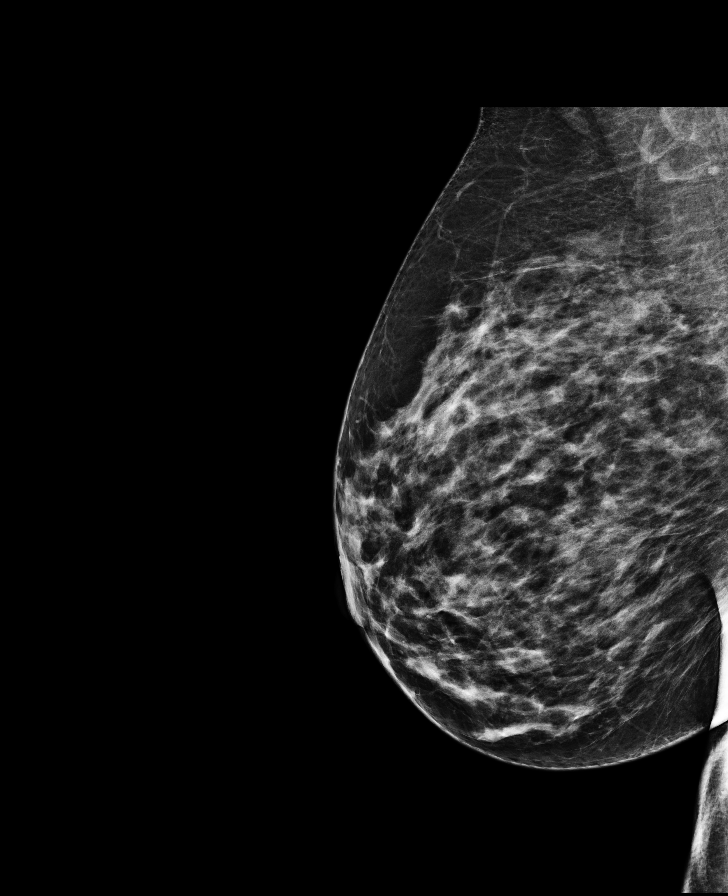

[R CC synth-2D]
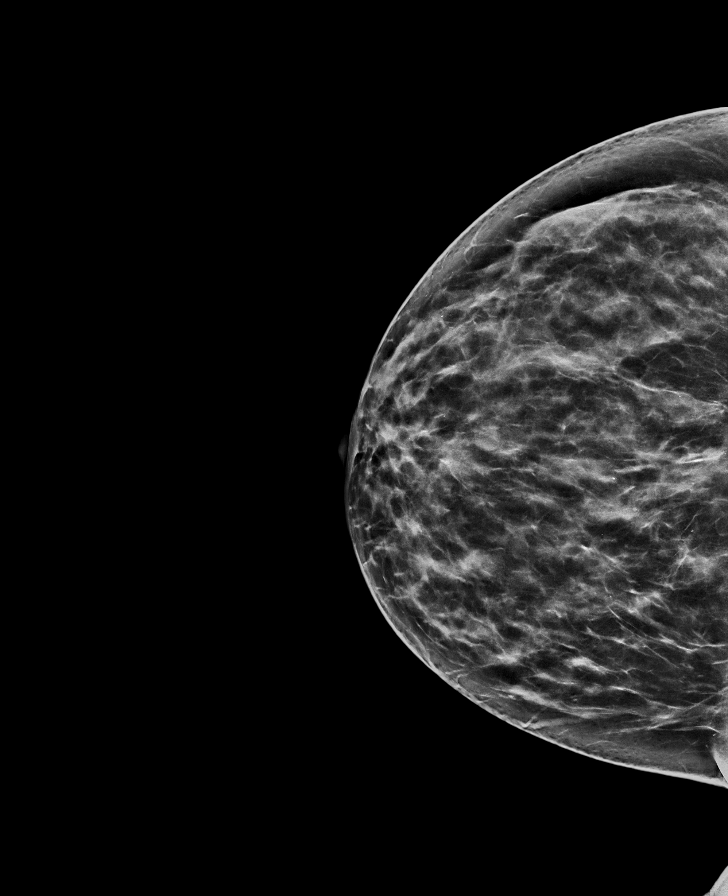

[R CC]
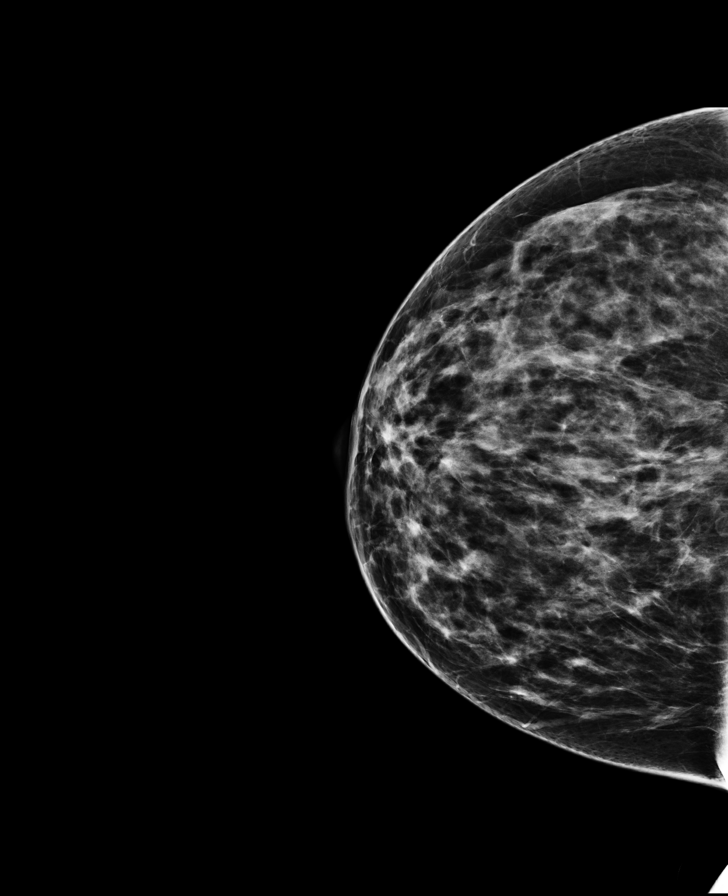

[L CC synth-2D]
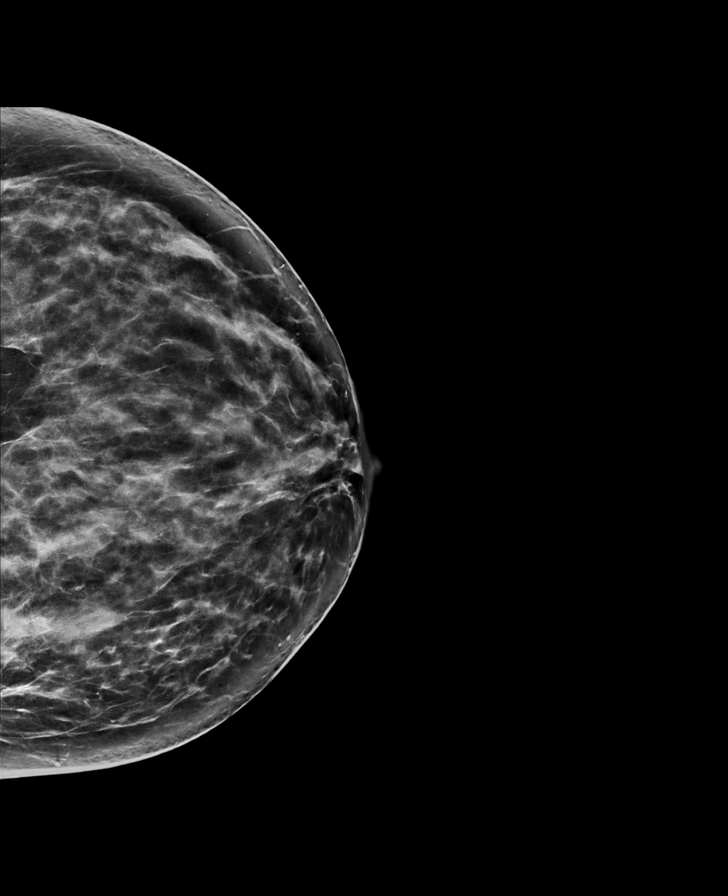

[L MLO]
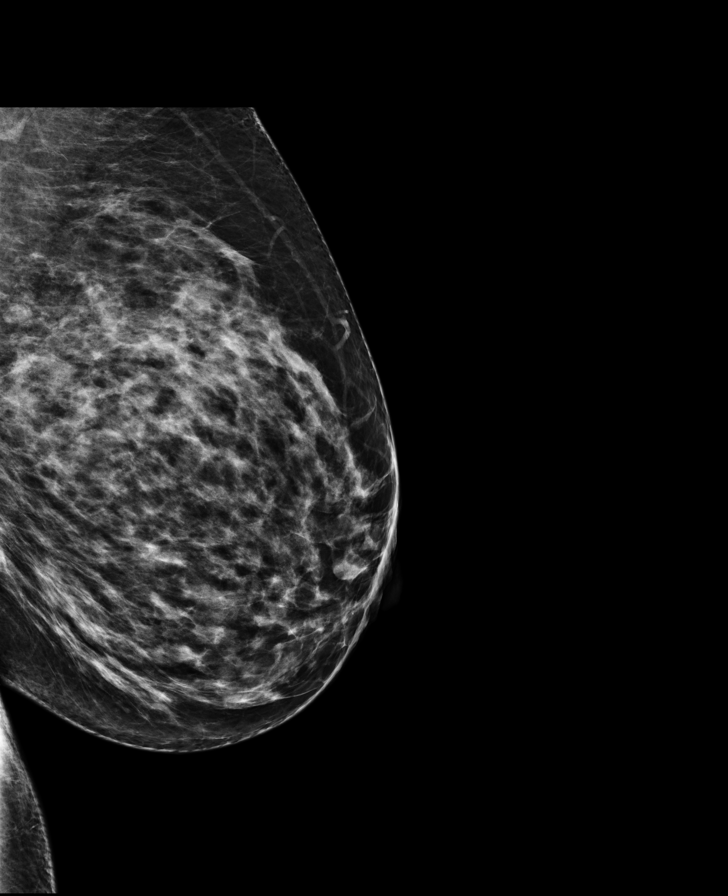

[L MLO synth-2D]
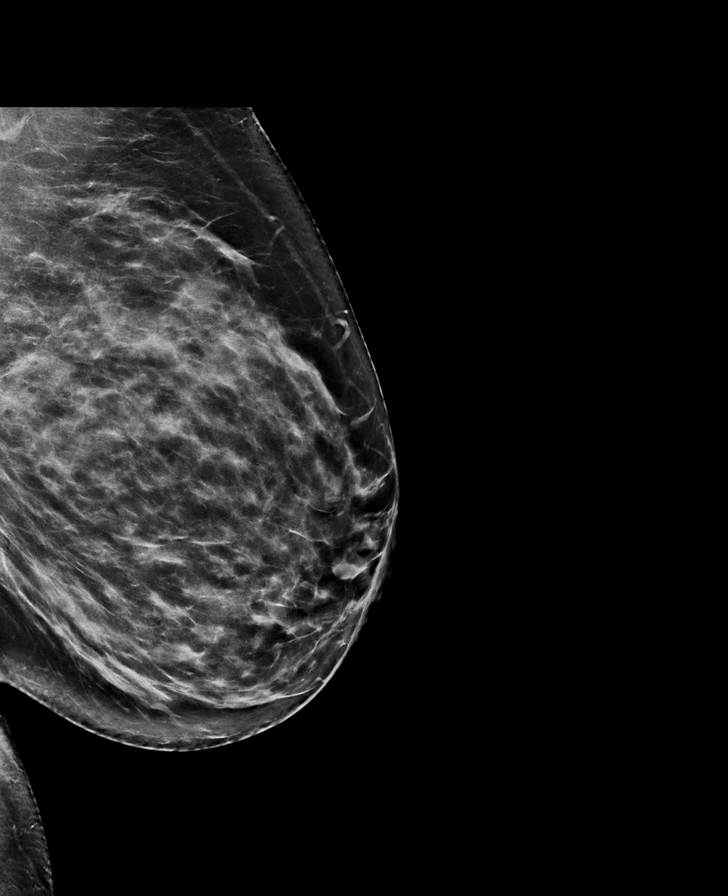

[8 of 28 positions shown; findings below may reference images not displayed]

ACR Breast Density Category c: The breast tissue is heterogeneously
dense, which may obscure small masses.
FINDINGS: There are no findings suspicious for malignancy. Images were
processed with CAD.
IMPRESSION: No mammographic evidence of malignancy. A result letter of this
screening mammogram will be mailed directly to the patient.

RECOMMENDATION:
Screening mammogram in one year. (Code:[id number]-[id number])

BI-RADS CATEGORY  1: Negative.

## 2017-08-27 ENCOUNTER — Ambulatory Visit: Payer: PRIVATE HEALTH INSURANCE | Admitting: Physical Therapy

## 2017-08-27 DIAGNOSIS — M25512 Pain in left shoulder: Secondary | ICD-10-CM | POA: Diagnosis not present

## 2017-08-27 DIAGNOSIS — R293 Abnormal posture: Secondary | ICD-10-CM

## 2017-08-27 DIAGNOSIS — M6281 Muscle weakness (generalized): Secondary | ICD-10-CM

## 2017-08-27 DIAGNOSIS — M25612 Stiffness of left shoulder, not elsewhere classified: Secondary | ICD-10-CM

## 2017-08-27 DIAGNOSIS — R29898 Other symptoms and signs involving the musculoskeletal system: Secondary | ICD-10-CM

## 2017-08-27 NOTE — Therapy (Signed)
[facility] [address]  [place], [place], [address] Phone: [phone]   Fax:  [phone]  Physical Therapy Treatment  Patient Details  Name: Danielle Braun MRN: [id number] Date of Birth: Nov 13, 1960 Referring Provider: Grier Mitts, PA-Danielle  Encounter Date: 08/27/2017      PT End of Session - 08/27/17 1620    Visit Number 6   Number of Visits 8   Date for PT Re-Evaluation 09/06/17   Authorization Type WC - 8 visits authorized   PT Start Time 1620   PT Stop Time 1702   PT Time Calculation (min) 42 min   Activity Tolerance Patient tolerated treatment well   Behavior During Therapy [facility] for tasks assessed/performed      Past Medical History:  Diagnosis Date  . Acne   . Endometriosis   . GERD (gastroesophageal reflux disease)     Past Surgical History:  Procedure Laterality Date  . ABDOMINAL HYSTERECTOMY    . ABDOMINAL SURGERY     csection  . TONSILLECTOMY      There were no vitals filed for this visit.      Subjective Assessment - 08/27/17 1622    Subjective Pt reports MD has scheduled an MRI for tomorrow but wants her to continue PT to prevent LOM.   Patient Stated Goals "restore the function I had before"   Currently in Pain? Yes   Pain Score 4   up to 7/10   Pain Location Shoulder   Pain Orientation Left;Posterior   Pain Descriptors / Indicators Heaviness   Pain Type Acute pain   Pain Frequency Constant                         [facility] Adult PT Treatment/Exercise - 08/27/17 1620      Exercises   Exercises Shoulder     Shoulder Exercises: Supine   Flexion Left;AROM;10 reps   Flexion Limitations with MWM for scapular depression & retraction     Shoulder Exercises: Pulleys   Flexion 2 minutes   ABduction 2 minutes   ABduction Limitations scaption     Shoulder Exercises: Therapy Ball   Flexion 10 reps   Flexion Limitations orange Pball on wall - gentle stretch  top of motion     Shoulder Exercises: Stretch   Cross Chest Stretch 30 seconds;2 reps   Cross Chest Stretch Limitations posterior capsule stretch - manual overpressure with opp hand + ER, doorway stretch (cues to avoid shoulder hiking)     Modalities   Modalities Iontophoresis     Iontophoresis   Type of Iontophoresis Dexamethasone   Location L posterior shoulder    Dose 80 mA-min, 1.0 ml    Time 4-6 hr patch (#2 of 6)     Manual Therapy   Manual Therapy Soft tissue mobilization;Myofascial release;Passive ROM;Joint mobilization   Manual therapy comments supine & R sidelying   Joint Mobilization MWM with PROM & AROM R shoulder flexion - pt reporting decreased pain and pain onset further in ROM with MWM   Soft tissue mobilization STM to L pecs, L teres group/subscap & rhomboids   Myofascial Release TPR to L teres group & subscap   Passive ROM scap depression & retraction; MWM for L shoulder flexion                     PT Long Term Goals - 08/22/17 0071  PT LONG TERM GOAL #1   Title Indepenent with ongoing HEP   Status On-going     PT LONG TERM GOAL #2   Title L shoulder AROM equivalent to R w/o increased pain   Status Partially Met     PT LONG TERM GOAL #3   Title L shoulder strength >/= 4/5 to 4+/5 w/o pain on resistance   Status Partially Met     PT LONG TERM GOAL #4   Title Pt will report ability to complete ADL's, typical household chore and job tasks w/o restrictions due to L shoulder LOM, weakness or pain   Status On-going               Plan - 08/27/17 1702    Clinical Impression Statement Pt noting benefit from ionto patch last treatment with anterior L shoulder pain mostly resolved today but increased posterior shoulder pain primarily over teres group and subscapularis today. Significant increased muscle tension and TPs noted both in axillary region as well as along inferior medial border of the scapula, with manual therapy targeting these  areas and pt noting some relief with this. Pt demonstrating increased tendency for L shoulder hiking during overhead flexion and abduction motions today. When scapular depression & retraction MWM applied pt reporting decreased intenisty of pain and later onset of pain during ROM. Given postive response to ionto patch at last visit, 2nd patch applied, this time to posterior capsule over teres group. Will also plan to assess response to estim and potentially recommend home TENS/IT unit as indicated.   Rehab Potential Good   PT Treatment/Interventions Patient/family education;ADLs/Self Care Home Management;Neuromuscular re-education;Therapeutic exercise;Therapeutic activities;Manual techniques;Dry needling;Taping;Passive range of motion;Electrical Stimulation;Moist Heat;Cryotherapy;Vasopneumatic Device;Iontophoresis 104m/ml Dexamethasone   Consulted and Agree with Plan of Care Patient      Patient will benefit from skilled therapeutic intervention in order to improve the following deficits and impairments:  Pain, Impaired flexibility, Decreased range of motion, Decreased strength, Increased muscle spasms, Postural dysfunction, Improper body mechanics, Impaired UE functional use, Decreased activity tolerance  Visit Diagnosis: Acute pain of left shoulder  Stiffness of left shoulder, not elsewhere classified  Abnormal posture  Muscle weakness (generalized)  Other symptoms and signs involving the musculoskeletal system     Problem List Patient Active Problem List   Diagnosis Date Noted  . ANKLE SPRAIN, LEFT 04/10/2009  . ACUTE MAXILLARY SINUSITIS 11/06/2008  . URI 11/06/2008    JPercival Spanish PT, MPT 08/27/2017, 6:45 PM  [facility] [place] [address]Phone: [phone]  Fax:  [phone] Name: Danielle Braun: [id number] of Birth: 102-22-1962

## 2017-08-28 ENCOUNTER — Ambulatory Visit (HOSPITAL_COMMUNITY)
Admission: RE | Admit: 2017-08-28 | Discharge: 2017-08-28 | Disposition: A | Discharge status: Home / Self Care | Payer: PRIVATE HEALTH INSURANCE | Source: Ambulatory Visit | Attending: Orthopedic Surgery | Admitting: Orthopedic Surgery

## 2017-08-28 DIAGNOSIS — M67912 Unspecified disorder of synovium and tendon, left shoulder: Secondary | ICD-10-CM | POA: Diagnosis present

## 2017-08-28 DIAGNOSIS — M19012 Primary osteoarthritis, left shoulder: Secondary | ICD-10-CM | POA: Diagnosis not present

## 2017-08-28 DIAGNOSIS — M7592 Shoulder lesion, unspecified, left shoulder: Secondary | ICD-10-CM | POA: Diagnosis not present

## 2017-08-29 ENCOUNTER — Ambulatory Visit: Payer: PRIVATE HEALTH INSURANCE | Attending: Surgical

## 2017-08-29 DIAGNOSIS — M25612 Stiffness of left shoulder, not elsewhere classified: Secondary | ICD-10-CM | POA: Insufficient documentation

## 2017-08-29 DIAGNOSIS — R293 Abnormal posture: Secondary | ICD-10-CM | POA: Diagnosis present

## 2017-08-29 DIAGNOSIS — M6281 Muscle weakness (generalized): Secondary | ICD-10-CM | POA: Insufficient documentation

## 2017-08-29 DIAGNOSIS — R29898 Other symptoms and signs involving the musculoskeletal system: Secondary | ICD-10-CM | POA: Insufficient documentation

## 2017-08-29 DIAGNOSIS — M25512 Pain in left shoulder: Secondary | ICD-10-CM | POA: Diagnosis not present

## 2017-08-29 NOTE — Therapy (Signed)
[facility] [address]  [place], [place], [address] Phone: [phone]   Fax:  [phone]  Physical Therapy Treatment  Patient Details  Name: Danielle Braun MRN: [id number] Date of Birth: 12-15-1960 Referring Provider: Grier Mitts, PA-C  Encounter Date: 08/29/2017      PT End of Session - 08/29/17 0844    Visit Number 7   Number of Visits 8   Date for PT Re-Evaluation 09/06/17   Authorization Type WC - 8 visits authorized   PT Start Time 0836   PT Stop Time 0915   PT Time Calculation (min) 39 min   Activity Tolerance Patient tolerated treatment well   Behavior During Therapy [facility] for tasks assessed/performed      Past Medical History:  Diagnosis Date  . Acne   . Endometriosis   . GERD (gastroesophageal reflux disease)     Past Surgical History:  Procedure Laterality Date  . ABDOMINAL HYSTERECTOMY    . ABDOMINAL SURGERY     csection  . TONSILLECTOMY      There were no vitals filed for this visit.      Subjective Assessment - 08/29/17 0836    Subjective Pt. reporting she was sleeping on L side and felt "pop" in L shoulder without pain when turning over last night.     Patient Stated Goals "restore the function I had before"   Currently in Pain? Yes   Pain Score 5    Pain Location Shoulder   Pain Orientation Left;Posterior   Pain Descriptors / Indicators Heaviness   Pain Type Acute pain   Pain Onset More than a month ago   Pain Frequency Intermittent   Aggravating Factors  reaching across body   Pain Relieving Factors heating pad    Multiple Pain Sites No                         [facility] Adult PT Treatment/Exercise - 08/29/17 0901      Self-Care   Self-Care Other Self-Care Comments   Other Self-Care Comments  self-ball massage with red med ball (1000Gr) to posterior shoulder in L sidelying  x 2 min      Shoulder Exercises: Standing   Flexion AAROM;10 reps;Left    Flexion Limitations finger ladder    ABduction AAROM;10 reps;Left   ABduction Limitations scaption on finger ladder      Shoulder Exercises: Pulleys   Flexion 3 minutes   ABduction 3 minutes   ABduction Limitations scaption     Shoulder Exercises: ROM/Strengthening   Wall Pushups 10 reps   Pushups Limitations on orange p-ball on wall      Shoulder Exercises: Stretch   Corner Stretch 2 reps;30 seconds   Corner Stretch Limitations mid, high version; doorway stretch     Modalities   Modalities Iontophoresis     Cryotherapy   Number Minutes Cryotherapy 10 Minutes   Cryotherapy Location Shoulder   Type of Cryotherapy Ice pack     Electrical Stimulation   Electrical Stimulation Location L shoulder complex   Electrical Stimulation Action IFC   Electrical Stimulation Parameters intensity to pt. tolerance, 10'   Electrical Stimulation Goals Pain;Tone     Iontophoresis   Type of Iontophoresis Dexamethasone   Location L posterior shoulder    Dose 80 mA-min, 1.0 ml    Time 4-6 hr patch (#3 of 6)     Manual Therapy   Manual Therapy Soft  tissue mobilization;Passive ROM;Joint mobilization   Manual therapy comments supine & R sidelying   Joint Mobilization L scapular mobs all directions   Soft tissue mobilization STM to L teres group/subscap & rhomboids   Passive ROM scap depression & retraction; MWM for L shoulder flexion                PT Education - 08/29/17 0939    Education provided Yes   Education Details wall wash flexion, abduction    Person(s) Educated Patient   Methods Explanation;Demonstration;Verbal cues;Handout   Comprehension Verbalized understanding;Returned demonstration;Verbal cues required;Need further instruction             PT Long Term Goals - 08/22/17 0932      PT LONG TERM GOAL #1   Title Indepenent with ongoing HEP   Status On-going     PT LONG TERM GOAL #2   Title L shoulder AROM equivalent to R w/o increased pain   Status Partially  Met     PT LONG TERM GOAL #3   Title L shoulder strength >/= 4/5 to 4+/5 w/o pain on resistance   Status Partially Met     PT LONG TERM GOAL #4   Title Pt will report ability to complete ADL's, typical household chore and job tasks w/o restrictions due to L shoulder LOM, weakness or pain   Status On-going               Plan - 08/29/17 0900    Clinical Impression Statement [name] doing well today reporting benefit from ionto patch last visit and requesting reapplication.  Had some pain last night in bed and "popping" sensation when turning over onto L shoulder.  Reported typical quick "twinges" of pain in treatment today with flexion reaching activities.  Tolerated all ROM and gentle scapular strengthening activities well today.  HEP updated with wall slide activities to help maintain ROM.  Ended treatment with E-stim/cold pack combo to L shoulder complex for hopeful decrease in post-exercise swelling and soreness with good response following.  Ionto patch #3/6 applied to L posterior shoulder to decrease swelling and pain.     PT Treatment/Interventions Patient/family education;ADLs/Self Care Home Management;Neuromuscular re-education;Therapeutic exercise;Therapeutic activities;Manual techniques;Dry needling;Taping;Passive range of motion;Electrical Stimulation;Moist Heat;Cryotherapy;Vasopneumatic Device;Iontophoresis 94m/ml Dexamethasone      Patient will benefit from skilled therapeutic intervention in order to improve the following deficits and impairments:  Pain, Impaired flexibility, Decreased range of motion, Decreased strength, Increased muscle spasms, Postural dysfunction, Improper body mechanics, Impaired UE functional use, Decreased activity tolerance  Visit Diagnosis: Acute pain of left shoulder  Stiffness of left shoulder, not elsewhere classified  Abnormal posture  Muscle weakness (generalized)  Other symptoms and signs involving the musculoskeletal  system     Problem List Patient Active Problem List   Diagnosis Date Noted  . ANKLE SPRAIN, LEFT 04/10/2009  . ACUTE MAXILLARY SINUSITIS 11/06/2008  . URI 11/06/2008   MBess Harvest PTA 08/29/17 12:49 PM  [facility] [address] [place] [address]Phone: [phone]  Fax:  [phone] Name: Danielle DONNELLYMRN: [id number] of Birth: 104/20/1962

## 2017-09-03 ENCOUNTER — Encounter: Payer: Self-pay | Admitting: Physical Therapy

## 2017-09-03 ENCOUNTER — Ambulatory Visit: Payer: PRIVATE HEALTH INSURANCE | Admitting: Physical Therapy

## 2017-09-03 DIAGNOSIS — M25512 Pain in left shoulder: Secondary | ICD-10-CM

## 2017-09-03 DIAGNOSIS — M6281 Muscle weakness (generalized): Secondary | ICD-10-CM

## 2017-09-03 DIAGNOSIS — R293 Abnormal posture: Secondary | ICD-10-CM

## 2017-09-03 DIAGNOSIS — M25612 Stiffness of left shoulder, not elsewhere classified: Secondary | ICD-10-CM

## 2017-09-03 DIAGNOSIS — R29898 Other symptoms and signs involving the musculoskeletal system: Secondary | ICD-10-CM

## 2017-09-03 NOTE — Therapy (Addendum)
[facility] [address]  [place], [place], [address] Phone: [phone]   Fax:  [phone]  Physical Therapy Treatment  Patient Details  Name: Danielle Braun MRN: [id number] Date of Birth: 1961-06-10 Referring Provider: Grier Mitts, PA-C   Encounter Date: 09/03/2017  PT End of Session - 09/03/17 0847    Visit Number  8    Number of Visits  8    Date for PT Re-Evaluation  09/06/17    Authorization Type  WC - 8 visits authorized    PT Start Time  0847    PT Stop Time  0937    PT Time Calculation (min)  50 min    Activity Tolerance  Patient tolerated treatment well    Behavior During Therapy  [facility] for tasks assessed/performed       Past Medical History:  Diagnosis Date  . Acne   . Endometriosis   . GERD (gastroesophageal reflux disease)     Past Surgical History:  Procedure Laterality Date  . ABDOMINAL HYSTERECTOMY    . ABDOMINAL SURGERY     csection  . TONSILLECTOMY      There were no vitals filed for this visit.  Subjective Assessment - 09/03/17 0851    Subjective  Pt reports her resting pain had previously subsided, but as of Friday notes pain has increased both at rest and with activity. Did admit to cleaning her floor at the time, but states she had been doing this w/o change in pain previously. Has also noted increase in popping sensation with pain now more localized to proximal shoulder as compared to more midshift humerus previously. Had MRI last Wed but will not follow up with MD until 09/11/17 - WC wants her to wait until MD f/u to determine if further PT indicated.    Diagnostic tests  08/28/17 - L shoulder MRI:  Supraspinatus and infraspinatus tendinopathy without tear.  Prominent subacromial/subdeltoid fluid consistent with bursitis.  Mild acromioclavicular osteoarthritis.    Patient Stated Goals  "restore the function I had before"    Currently in Pain?  Yes    Pain Score  6  2-4/10 at  rest   2-4/10 at rest   Pain Location  Shoulder    Pain Orientation  Left;Lateral    Pain Descriptors / Indicators  Aching;Sharp "varies between deep ache & sharp pain"   "varies between deep ache & sharp pain"   Pain Type  Acute pain    Pain Onset  More than a month ago    Pain Frequency  Constant    Aggravating Factors   carrying even light objects in L hand, reaching across body    Pain Relieving Factors  Voltaren gel, cold pack, Rx & OTC meds    Effect of Pain on Daily Activities  very limited use of L arm         [facility] PT Assessment - 09/03/17 0847      Assessment   Medical Diagnosis  L rotator cuff syndrome    Referring Provider  Grier Mitts, PA-C    Onset Date/Surgical Date  07/12/17    Hand Dominance  Right    Next MD Visit  09/11/17      AROM   Left Shoulder Flexion  156 Degrees    Left Shoulder ABduction  144 Degrees    Left Shoulder Internal Rotation  -- FIR to lower scapula   FIR to lower scapula   Left Shoulder  External Rotation  -- FER to T2   FER to T2     Strength   Right Shoulder Flexion  4+/5    Right Shoulder ABduction  4+/5    Right Shoulder Internal Rotation  4+/5    Right Shoulder External Rotation  4+/5    Left Shoulder Flexion  4/5    Left Shoulder ABduction  4-/5 pain with resistance    pain with resistance    Left Shoulder Internal Rotation  4/5    Left Shoulder External Rotation  3+/5 pain with resistance    pain with resistance                  [facility] Adult PT Treatment/Exercise - 09/03/17 0847      Self-Care   Self-Care  Other Self-Care Comments    Other Self-Care Comments   Instructed pt in set-up and use of home TENS/IT unit, including typical electrode placement and recommended treatment time and frequency of use.      Exercises   Exercises  Shoulder      Shoulder Exercises: Supine   Flexion  Both;10 reps;Weights;AAROM    Flexion Limitations  2# on wand; hooklying on 1/2 FR - cues for scap retraction &  depression    Other Supine Exercises  scap retraction + alt shoulder flex + opp shoulder ext with yellow TB x10; hooklying on 1/2 FR      Shoulder Exercises: Standing   Horizontal ABduction  Both;10 reps;Theraband;Strengthening    Theraband Level (Shoulder Horizontal ABduction)  Level 2 (Red)    Horizontal ABduction Limitations  standing against 1/2 FR on wall    Extension  Both;10 reps;Theraband;Strengthening    Theraband Level (Shoulder Extension)  Level 2 (Red)    Extension Limitations  emphasis on scap retraction & depression - pt noting anterior shoulder/upper arm pain at end range of motion    Row  Both;10 reps;Theraband;Strengthening    Theraband Level (Shoulder Row)  Level 2 (Red)    Row Limitations  emphasis on scap retraction & depression - pt noting anterior shoulder/upper arm pain at end range of motion      Shoulder Exercises: ROM/Strengthening   UBE (Upper Arm Bike)  lvl 2.0 fwd/back x 2' each      Modalities   Modalities  Iontophoresis      Iontophoresis   Type of Iontophoresis  Dexamethasone    Location  L lateral shoulder    Dose  80 mA-min, 1.0 ml     Time  4-6 hr patch (#4 of 6)             PT Education - 09/03/17 0931    Education provided  Yes    Education Details  Pt provided with [employer] home TENS/IT unit after instruction in set-up and use of home unit, including typical electrode placement and recommended treatment time and frequency of use.    Person(s) Educated  Patient    Methods  Explanation;Demonstration    Comprehension  Verbalized understanding          PT Long Term Goals - 08/22/17 0932      PT LONG TERM GOAL #1   Title  Indepenent with ongoing HEP    Status  On-going      PT LONG TERM GOAL #2   Title  L shoulder AROM equivalent to R w/o increased pain    Status  Partially Met      PT LONG TERM GOAL #3   Title  L shoulder strength >/= 4/5 to 4+/5 w/o pain on resistance    Status  Partially Met      PT LONG TERM GOAL #4   Title   Pt will report ability to complete ADL's, typical household chore and job tasks w/o restrictions due to L shoulder LOM, weakness or pain    Status  On-going            Plan - 09/03/17 0930    Clinical Impression Statement  [name] reporting worsening of pain since the end of last week with increased incidence of "popping" sensation associated with pain with certain movements, without known triggering event. Pain now present again at rest, typically 2-4/10 (previously had gotten to the point where she was typically pain-free at rest), and up to 0/99 with certain movements, with location of pain continuing to fluctuate. Today's pain located primarily in lateral shoulder whereas pain has previously been in anterior shoulder/upper arm and posterior capsule. L shoulder ROM slightly decreased from last measurements with strength testing essentially unchanged. Pt awaiting f/u with MD on Wed 09/11/17 for MRI results and determination of future course of treatment/therapy, therefore will place pt on hold until further direction received from MD & WC.    Rehab Potential  Good    PT Treatment/Interventions  Patient/family education;ADLs/Self Care Home Management;Neuromuscular re-education;Therapeutic exercise;Therapeutic activities;Manual techniques;Dry needling;Taping;Passive range of motion;Electrical Stimulation;Moist Heat;Cryotherapy;Vasopneumatic Device;Iontophoresis 53m/ml Dexamethasone    PT Next Visit Plan  hold pending f/u with MD on 09/11/17    Consulted and Agree with Plan of Care  Patient       Patient will benefit from skilled therapeutic intervention in order to improve the following deficits and impairments:  Pain, Impaired flexibility, Decreased range of motion, Decreased strength, Increased muscle spasms, Postural dysfunction, Improper body mechanics, Impaired UE functional use, Decreased activity tolerance  Visit Diagnosis: Acute pain of left shoulder  Stiffness of left shoulder, not  elsewhere classified  Abnormal posture  Muscle weakness (generalized)  Other symptoms and signs involving the musculoskeletal system     Problem List Patient Active Problem List   Diagnosis Date Noted  . ANKLE SPRAIN, LEFT 04/10/2009  . ACUTE MAXILLARY SINUSITIS 11/06/2008  . URI 11/06/2008    JPercival Spanish PT, MPT 09/03/2017, 1:38 PM  [facility] [place] [address]Phone: [phone]  Fax:  [phone] Name: CJAZARAH CAPILIMRN: [id number] of Birth: 111-25-1962 PHYSICAL THERAPY DISCHARGE SUMMARY  Visits from Start of Care: 8  Current functional level related to goals / functional outcomes:   Refer to above clinical impression for status as of last PT visit on 09/03/17. PT informed that MD has released her from PT.   Remaining deficits:   As above.   Education / Equipment:   HEP  Plan: Patient agrees to discharge.  Patient goals were partially met. Patient is being discharged due to the physician's request.  ?????    JPercival Spanish PT, MPT 10/07/17, 12:16 PM  [facility] [place] [address]Phone: [phone]  Fax:  [phone]

## 2017-09-11 MED FILL — CYCLOBENZAPRINE 5 MG TABLET: 5 | 30 days supply | Qty: 30 | Fill #0

## 2017-09-11 MED FILL — MELOXICAM 7.5 MG TABLET: 7.5 | 30 days supply | Qty: 60 | Fill #0

## 2017-09-23 DIAGNOSIS — M8580 Other specified disorders of bone density and structure, unspecified site: Secondary | ICD-10-CM | POA: Diagnosis not present

## 2017-09-23 DIAGNOSIS — Z0000 Encounter for general adult medical examination without abnormal findings: Secondary | ICD-10-CM | POA: Diagnosis not present

## 2017-09-24 DIAGNOSIS — M8580 Other specified disorders of bone density and structure, unspecified site: Secondary | ICD-10-CM | POA: Diagnosis not present

## 2017-09-24 DIAGNOSIS — K219 Gastro-esophageal reflux disease without esophagitis: Secondary | ICD-10-CM | POA: Diagnosis not present

## 2017-09-24 DIAGNOSIS — Z0000 Encounter for general adult medical examination without abnormal findings: Secondary | ICD-10-CM | POA: Diagnosis not present

## 2017-09-24 DIAGNOSIS — J302 Other seasonal allergic rhinitis: Secondary | ICD-10-CM | POA: Diagnosis not present

## 2017-09-24 MED FILL — FLUTICASONE PROP 50 MCG SPR: 50 | 90 days supply | Qty: 48 | Fill #0

## 2017-09-24 MED FILL — ESOMEPRAZOLE MAG DR 20 MG C: 20 | 90 days supply | Qty: 90 | Fill #0

## 2017-09-25 MED FILL — traMADol HCL 50 MG TABS: 50 | 10 days supply | Qty: 30 | Fill #0

## 2017-09-26 DIAGNOSIS — M8580 Other specified disorders of bone density and structure, unspecified site: Secondary | ICD-10-CM | POA: Insufficient documentation

## 2017-09-26 MED FILL — DICLOFENAC SODIUM 1% GEL: 1 | 25 days supply | Qty: 300 | Fill #0

## 2017-10-01 ENCOUNTER — Other Ambulatory Visit: Payer: Self-pay | Admitting: Orthopedic Surgery

## 2017-10-05 MED FILL — MELOXICAM 7.5 MG TABLET: 7.5 | 30 days supply | Qty: 60 | Fill #0

## 2017-10-16 MED FILL — traMADol HCL 50 MG TABS: 50 | 5 days supply | Qty: 15 | Fill #0

## 2017-10-16 MED FILL — CYCLOBENZAPRINE 5 MG TABLET: 5 | 10 days supply | Qty: 30 | Fill #0

## 2017-10-16 NOTE — Pre-Procedure Instructions (Signed)
Danielle Braun  10/16/2017      [place], [place] [address] Phone: [phone] Fax: [phone]    Your procedure is scheduled on  Thursday 10/31/17  Report to [facility] Admitting at 1030 A.M.  Call this number if you have problems the morning of surgery:  [phone]   Remember:  Do not eat food or drink liquids after midnight.  Take these medicines the morning of surgery with A SIP OF WATER-                     NEXIUM                      RANITIDINE (ZANTAC)                      TRAMADOL (ULTRAM) IF NEEDED  7 days prior to surgery STOP taking any Aspirin(unless otherwise instructed by your surgeon), Aleve, Naproxen, Ibuprofen, Motrin, Advil, Goody's, BC's, all herbal medications, fish oil, and all vitamins   Do not wear jewelry, make-up or nail polish.  Do not wear lotions, powders, or perfumes, or deodorant.  Do not shave 48 hours prior to surgery.  Men may shave face and neck.  Do not bring valuables to the hospital.  [facility] is not responsible for any belongings or valuables.  Contacts, dentures or bridgework may not be worn into surgery.  Leave your suitcase in the car.  After surgery it may be brought to your room.  For patients admitted to the hospital, discharge time will be determined by your treatment team.  Patients discharged the day of surgery will not be allowed to drive home.   Name and phone number of your driver:    Special instructions:  [place] - Preparing for Surgery  Before surgery, you can play an important role.  Because skin is not sterile, your skin needs to be as free of germs as possible.  You can reduce the number of germs on you skin by washing with CHG (chlorahexidine gluconate) soap before surgery.  CHG is an antiseptic cleaner which kills germs and bonds with the skin to continue killing germs even after washing.  Please DO NOT use  if you have an allergy to CHG or antibacterial soaps.  If your skin becomes reddened/irritated stop using the CHG and inform your nurse when you arrive at Short Stay.  Do not shave (including legs and underarms) for at least 48 hours prior to the first CHG shower.  You may shave your face.  Please follow these instructions carefully:   1.  Shower with CHG Soap the night before surgery and the                                morning of Surgery.  2.  If you choose to wash your hair, wash your hair first as usual with your       normal shampoo.  3.  After you shampoo, rinse your hair and body thoroughly to remove the                      Shampoo.  4.  Use CHG as you would any other liquid soap.  You can apply chg directly       to  the skin and wash gently with scrungie or a clean washcloth.  5.  Apply the CHG Soap to your body ONLY FROM THE NECK DOWN.        Do not use on open wounds or open sores.  Avoid contact with your eyes,       ears, mouth and genitals (private parts).  Wash genitals (private parts)       with your normal soap.  6.  Wash thoroughly, paying special attention to the area where your surgery        will be performed.  7.  Thoroughly rinse your body with warm water from the neck down.  8.  DO NOT shower/wash with your normal soap after using and rinsing off       the CHG Soap.  9.  Pat yourself dry with a clean towel.            10.  Wear clean pajamas.            11.  Place clean sheets on your bed the night of your first shower and do not        sleep with pets.  Day of Surgery  Do not apply any lotions/deoderants the morning of surgery.  Please wear clean clothes to the hospital/surgery center.    Please read over the following fact sheets that you were given. Pain Booklet

## 2017-10-17 ENCOUNTER — Encounter (HOSPITAL_COMMUNITY)
Admission: RE | Admit: 2017-10-17 | Discharge: 2017-10-17 | Disposition: A | Discharge status: Home / Self Care | Payer: PRIVATE HEALTH INSURANCE | Source: Ambulatory Visit | Attending: Orthopedic Surgery | Admitting: Orthopedic Surgery

## 2017-10-17 ENCOUNTER — Other Ambulatory Visit: Payer: Self-pay

## 2017-10-17 ENCOUNTER — Encounter (HOSPITAL_COMMUNITY): Payer: Self-pay

## 2017-10-17 DIAGNOSIS — Z01812 Encounter for preprocedural laboratory examination: Secondary | ICD-10-CM | POA: Diagnosis not present

## 2017-10-17 DIAGNOSIS — Z01818 Encounter for other preprocedural examination: Secondary | ICD-10-CM | POA: Diagnosis not present

## 2017-10-17 HISTORY — DX: Chronic sinusitis, unspecified: J32.9

## 2017-10-17 HISTORY — DX: Cardiac arrhythmia, unspecified: I49.9

## 2017-10-17 HISTORY — DX: Bursitis of unspecified shoulder: M75.50

## 2017-10-17 LAB — COMPREHENSIVE METABOLIC PANEL
ALBUMIN: 4.2 g/dL (ref 3.5–5.0)
ALT: 18 U/L (ref 14–54)
ANION GAP: 6 (ref 5–15)
AST: 22 U/L (ref 15–41)
Alkaline Phosphatase: 56 U/L (ref 38–126)
BILIRUBIN TOTAL: 0.6 mg/dL (ref 0.3–1.2)
BUN: 15 mg/dL (ref 6–20)
CHLORIDE: 106 mmol/L (ref 101–111)
CO2: 27 mmol/L (ref 22–32)
Calcium: 9.3 mg/dL (ref 8.9–10.3)
Creatinine, Ser: 0.73 mg/dL (ref 0.44–1.00)
GFR calc Af Amer: >60 mL/min (ref >60)
GFR calc non Af Amer: >60 mL/min (ref >60)
GLUCOSE: 90 mg/dL (ref 65–99)
POTASSIUM: 4.1 mmol/L (ref 3.5–5.1)
Sodium: 139 mmol/L (ref 135–145)
TOTAL PROTEIN: 7.4 g/dL (ref 6.5–8.1)

## 2017-10-17 LAB — CBC WITH DIFFERENTIAL/PLATELET
BASOS ABS: 0 10*3/uL (ref 0.0–0.1)
BASOS PCT: 1 %
EOS ABS: 0.1 10*3/uL (ref 0.0–0.7)
EOS PCT: 3 %
HEMATOCRIT: 40.1 % (ref 36.0–46.0)
Hemoglobin: 13.5 g/dL (ref 12.0–15.0)
Lymphocytes Relative: 31 %
Lymphs Abs: 1.3 10*3/uL (ref 0.7–4.0)
MCH: 31.7 pg (ref 26.0–34.0)
MCHC: 33.7 g/dL (ref 30.0–36.0)
MCV: 94.1 fL (ref 78.0–100.0)
MONO ABS: 0.5 10*3/uL (ref 0.1–1.0)
MONOS PCT: 11 %
NEUTROS ABS: 2.4 10*3/uL (ref 1.7–7.7)
Neutrophils Relative %: 54 %
PLATELETS: 282 10*3/uL (ref 150–400)
RBC: 4.26 MIL/uL (ref 3.87–5.11)
RDW: 12.7 % (ref 11.5–15.5)
WBC: 4.3 10*3/uL (ref 4.0–10.5)

## 2017-10-17 LAB — URINALYSIS, ROUTINE W REFLEX MICROSCOPIC
BILIRUBIN URINE: NEGATIVE
Glucose, UA: NEGATIVE mg/dL
Hgb urine dipstick: NEGATIVE
KETONES UR: NEGATIVE mg/dL
LEUKOCYTES UA: NEGATIVE
NITRITE: NEGATIVE
Protein, ur: NEGATIVE mg/dL
Specific Gravity, Urine: 1.002 — ABNORMAL LOW (ref 1.005–1.030)
pH: 7 (ref 5.0–8.0)

## 2017-10-17 LAB — APTT: APTT: 31 s (ref 24–36)

## 2017-10-17 LAB — PROTIME-INR
INR: 1
Prothrombin Time: 13.1 seconds (ref 11.4–15.2)

## 2017-10-17 NOTE — Progress Notes (Signed)
PCP - Dr. Jefm Petty  Cardiologist - Denies  Chest x-ray - 10/17/17  EKG - 10/17/17  Stress Test - > 10 yrs  ECHO - 09/18/06 (E)  Cardiac Cath - Denies  Sleep Study - No CPAP - None  LABS- 10/17/17: CBC w/D, CMP, PT, PTT, UA   Anesthesia- No  Pt denies having chest pain, sob, or fever at this time. All instructions explained to the pt, with a verbal understanding of the material. Pt agrees to go over the instructions while at home for a better understanding. The opportunity to ask questions was provided.

## 2017-10-25 MED FILL — METHOCARBAMOL 500 MG TABS: 500 | 10 days supply | Qty: 30 | Fill #0

## 2017-10-25 MED FILL — OXYCOD/ACETAMINOPHEN 5-325M: 5-325 | 6 days supply | Qty: 40 | Fill #0

## 2017-10-31 ENCOUNTER — Ambulatory Visit (HOSPITAL_COMMUNITY): Payer: PRIVATE HEALTH INSURANCE | Admitting: Anesthesiology

## 2017-10-31 ENCOUNTER — Ambulatory Visit (HOSPITAL_COMMUNITY)
Admission: RE | Admit: 2017-10-31 | Discharge: 2017-10-31 | Disposition: A | Discharge status: Home / Self Care | Payer: PRIVATE HEALTH INSURANCE | Source: Ambulatory Visit | Attending: Orthopedic Surgery | Admitting: Orthopedic Surgery

## 2017-10-31 ENCOUNTER — Encounter (HOSPITAL_COMMUNITY): Payer: Self-pay

## 2017-10-31 ENCOUNTER — Encounter (HOSPITAL_COMMUNITY)
Admission: RE | Disposition: A | Discharge status: Home / Self Care | Payer: Self-pay | Source: Ambulatory Visit | Attending: Orthopedic Surgery

## 2017-10-31 DIAGNOSIS — M7552 Bursitis of left shoulder: Secondary | ICD-10-CM | POA: Insufficient documentation

## 2017-10-31 DIAGNOSIS — M75112 Incomplete rotator cuff tear or rupture of left shoulder, not specified as traumatic: Secondary | ICD-10-CM | POA: Insufficient documentation

## 2017-10-31 DIAGNOSIS — K219 Gastro-esophageal reflux disease without esophagitis: Secondary | ICD-10-CM | POA: Insufficient documentation

## 2017-10-31 DIAGNOSIS — Z87891 Personal history of nicotine dependence: Secondary | ICD-10-CM | POA: Diagnosis not present

## 2017-10-31 DIAGNOSIS — Z79899 Other long term (current) drug therapy: Secondary | ICD-10-CM | POA: Insufficient documentation

## 2017-10-31 DIAGNOSIS — M25512 Pain in left shoulder: Secondary | ICD-10-CM | POA: Diagnosis present

## 2017-10-31 HISTORY — PX: SHOULDER ARTHROSCOPY WITH SUBACROMIAL DECOMPRESSION: SHX5684

## 2017-10-31 SURGERY — SHOULDER ARTHROSCOPY WITH SUBACROMIAL DECOMPRESSION
Anesthesia: General | Laterality: Left

## 2017-10-31 MED ORDER — OXYCODONE-ACETAMINOPHEN 5-325 MG PO TABS: 1.0000 | ORAL_TABLET | ORAL | 0 refills | Status: DC | PRN

## 2017-10-31 MED ORDER — FENTANYL CITRATE (PF) 100 MCG/2ML IJ SOLN
50.0000 ug | Freq: Once | INTRAMUSCULAR | Status: AC
Start: 2017-10-31 — End: 2017-10-31
  Administered 2017-10-31: 50 ug via INTRAVENOUS

## 2017-10-31 MED ORDER — MIDAZOLAM HCL 2 MG/2ML IJ SOLN
2.0000 mg | Freq: Once | INTRAMUSCULAR | Status: AC
  Administered 2017-10-31: 2 mg via INTRAVENOUS

## 2017-10-31 MED ORDER — BUPIVACAINE-EPINEPHRINE (PF) 0.5% -1:200000 IJ SOLN
INTRAMUSCULAR | Status: DC | PRN
  Administered 2017-10-31: 25 mL via PERINEURAL

## 2017-10-31 MED ORDER — PHENYLEPHRINE HCL 10 MG/ML IJ SOLN
INTRAVENOUS | Status: DC | PRN
  Administered 2017-10-31: 25 ug/min via INTRAVENOUS

## 2017-10-31 MED ORDER — LIDOCAINE 2% (20 MG/ML) 5 ML SYRINGE
INTRAMUSCULAR | Status: AC
  Filled 2017-10-31: qty 5

## 2017-10-31 MED ORDER — ROCURONIUM BROMIDE 10 MG/ML (PF) SYRINGE
PREFILLED_SYRINGE | INTRAVENOUS | Status: AC
  Filled 2017-10-31: qty 5

## 2017-10-31 MED ORDER — BUPIVACAINE-EPINEPHRINE (PF) 0.25% -1:200000 IJ SOLN
INTRAMUSCULAR | Status: AC
  Filled 2017-10-31: qty 30

## 2017-10-31 MED ORDER — PROPOFOL 10 MG/ML IV BOLUS
INTRAVENOUS | Status: DC | PRN
  Administered 2017-10-31: 150 mg via INTRAVENOUS

## 2017-10-31 MED ORDER — LIDOCAINE HCL (CARDIAC) 20 MG/ML IV SOLN
INTRAVENOUS | Status: DC | PRN
  Administered 2017-10-31: 20 mg via INTRAVENOUS

## 2017-10-31 MED ORDER — FENTANYL CITRATE (PF) 100 MCG/2ML IJ SOLN
INTRAMUSCULAR | Status: DC | PRN
  Administered 2017-10-31 (×2): 50 ug via INTRAVENOUS

## 2017-10-31 MED ORDER — SUGAMMADEX SODIUM 500 MG/5ML IV SOLN
INTRAVENOUS | Status: AC
  Filled 2017-10-31: qty 5

## 2017-10-31 MED ORDER — CEFAZOLIN SODIUM-DEXTROSE 2-4 GM/100ML-% IV SOLN
INTRAVENOUS | Status: AC
  Filled 2017-10-31: qty 100

## 2017-10-31 MED ORDER — DEXAMETHASONE SODIUM PHOSPHATE 10 MG/ML IJ SOLN
INTRAMUSCULAR | Status: DC | PRN
  Administered 2017-10-31: 10 mg via INTRAVENOUS

## 2017-10-31 MED ORDER — FENTANYL CITRATE (PF) 100 MCG/2ML IJ SOLN
INTRAMUSCULAR | Status: AC
  Administered 2017-10-31: 50 ug via INTRAVENOUS
  Filled 2017-10-31: qty 2

## 2017-10-31 MED ORDER — BUPIVACAINE-EPINEPHRINE (PF) 0.25% -1:200000 IJ SOLN
INTRAMUSCULAR | Status: DC | PRN
  Administered 2017-10-31: 10 mL via PERINEURAL

## 2017-10-31 MED ORDER — CEFAZOLIN SODIUM-DEXTROSE 2-4 GM/100ML-% IV SOLN
2.0000 g | INTRAVENOUS | Status: AC
  Administered 2017-10-31: 2 g via INTRAVENOUS

## 2017-10-31 MED ORDER — SODIUM CHLORIDE 0.9 % IR SOLN
Status: DC | PRN
  Administered 2017-10-31 (×2): 3000 mL

## 2017-10-31 MED ORDER — ROCURONIUM BROMIDE 100 MG/10ML IV SOLN
INTRAVENOUS | Status: DC | PRN
  Administered 2017-10-31: 60 mg via INTRAVENOUS

## 2017-10-31 MED ORDER — STERILE WATER FOR IRRIGATION IR SOLN
Status: DC | PRN
  Administered 2017-10-31: 1000 mL

## 2017-10-31 MED ORDER — ONDANSETRON HCL 4 MG/2ML IJ SOLN
INTRAMUSCULAR | Status: DC | PRN
  Administered 2017-10-31: 4 mg via INTRAVENOUS

## 2017-10-31 MED ORDER — LACTATED RINGERS IV SOLN
INTRAVENOUS | Status: DC
  Administered 2017-10-31: 11:00:00 via INTRAVENOUS

## 2017-10-31 MED ORDER — ONDANSETRON HCL 4 MG/2ML IJ SOLN
INTRAMUSCULAR | Status: AC
  Filled 2017-10-31: qty 2

## 2017-10-31 MED ORDER — POVIDONE-IODINE 7.5 % EX SOLN
Freq: Once | CUTANEOUS | Status: DC
  Filled 2017-10-31: qty 118

## 2017-10-31 MED ORDER — SUGAMMADEX SODIUM 200 MG/2ML IV SOLN
INTRAVENOUS | Status: DC | PRN
  Administered 2017-10-31: 300 mg via INTRAVENOUS

## 2017-10-31 MED ORDER — MIDAZOLAM HCL 2 MG/2ML IJ SOLN
INTRAMUSCULAR | Status: AC
  Administered 2017-10-31: 2 mg via INTRAVENOUS
  Filled 2017-10-31: qty 2

## 2017-10-31 MED ORDER — DEXAMETHASONE SODIUM PHOSPHATE 10 MG/ML IJ SOLN
INTRAMUSCULAR | Status: AC
  Filled 2017-10-31: qty 1

## 2017-10-31 MED ORDER — PROPOFOL 10 MG/ML IV BOLUS
INTRAVENOUS | Status: AC
  Filled 2017-10-31: qty 20

## 2017-10-31 MED ORDER — 0.9 % SODIUM CHLORIDE (POUR BTL) OPTIME
TOPICAL | Status: DC | PRN
  Administered 2017-10-31: 1000 mL

## 2017-10-31 MED ORDER — FENTANYL CITRATE (PF) 250 MCG/5ML IJ SOLN
INTRAMUSCULAR | Status: AC
  Filled 2017-10-31: qty 5

## 2017-10-31 SURGICAL SUPPLY — 61 items
BLADE SURG 11 STRL SS (BLADE) ×2 IMPLANT
BUR OVAL 4.0 (BURR) ×2 IMPLANT
CANNULA 5.75X71 LONG (CANNULA) ×2 IMPLANT
CANNULA TWIST IN 8.25X7CM (CANNULA) IMPLANT
CHLORAPREP W/TINT 26ML (MISCELLANEOUS) ×2 IMPLANT
DRAPE INCISE IOBAN 66X45 STRL (DRAPES) ×2 IMPLANT
DRAPE ORTHO SPLIT 77X108 STRL (DRAPES) ×4
DRAPE STERI 35X30 U-POUCH (DRAPES) ×2 IMPLANT
DRAPE SURG 17X23 STRL (DRAPES) ×2 IMPLANT
DRAPE SURG ORHT 6 SPLT 77X108 (DRAPES) ×2 IMPLANT
DRAPE U-SHAPE 47X51 STRL (DRAPES) ×2 IMPLANT
DRAPE U-SHAPE 76X120 STRL (DRAPES) ×2 IMPLANT
DRSG MEPILEX BORDER 4X8 (GAUZE/BANDAGES/DRESSINGS) ×1 IMPLANT
DRSG PAD ABDOMINAL 8X10 ST (GAUZE/BANDAGES/DRESSINGS) ×4 IMPLANT
GAUZE SPONGE 4X4 12PLY STRL (GAUZE/BANDAGES/DRESSINGS) ×2 IMPLANT
GAUZE SPONGE 4X4 12PLY STRL LF (GAUZE/BANDAGES/DRESSINGS) ×1 IMPLANT
GAUZE XEROFORM 1X8 LF (GAUZE/BANDAGES/DRESSINGS) ×2 IMPLANT
GLOVE BIO SURGEON STRL SZ7 (GLOVE) ×4 IMPLANT
GLOVE BIO SURGEON STRL SZ7.5 (GLOVE) ×2 IMPLANT
GLOVE BIOGEL PI IND STRL 7.0 (GLOVE) ×2 IMPLANT
GLOVE BIOGEL PI IND STRL 8 (GLOVE) ×1 IMPLANT
GLOVE BIOGEL PI INDICATOR 7.0 (GLOVE) ×2
GLOVE BIOGEL PI INDICATOR 8 (GLOVE) ×1
GOWN STRL REUS W/ TWL LRG LVL3 (GOWN DISPOSABLE) ×3 IMPLANT
GOWN STRL REUS W/ TWL XL LVL3 (GOWN DISPOSABLE) ×1 IMPLANT
GOWN STRL REUS W/TWL LRG LVL3 (GOWN DISPOSABLE) ×6
GOWN STRL REUS W/TWL XL LVL3 (GOWN DISPOSABLE) ×2
KIT BASIN OR (CUSTOM PROCEDURE TRAY) ×2 IMPLANT
KIT ROOM TURNOVER OR (KITS) ×2 IMPLANT
MANIFOLD NEPTUNE II (INSTRUMENTS) ×2 IMPLANT
NDL HYPO 25GX1X1/2 BEV (NEEDLE) IMPLANT
NDL SCORPION MULTI FIRE (NEEDLE) IMPLANT
NDL SPNL 18GX3.5 QUINCKE PK (NEEDLE) ×1 IMPLANT
NDL SUT 6 .5 CRC .975X.05 MAYO (NEEDLE) IMPLANT
NEEDLE HYPO 25GX1X1/2 BEV (NEEDLE) IMPLANT
NEEDLE MAYO TAPER (NEEDLE)
NEEDLE SCORPION MULTI FIRE (NEEDLE) IMPLANT
NEEDLE SPNL 18GX3.5 QUINCKE PK (NEEDLE) ×2 IMPLANT
PACK SHOULDER (CUSTOM PROCEDURE TRAY) ×2 IMPLANT
PAD ABD 8X10 STRL (GAUZE/BANDAGES/DRESSINGS) ×1 IMPLANT
PAD ARMBOARD 7.5X6 YLW CONV (MISCELLANEOUS) ×4 IMPLANT
RESECTOR FULL RADIUS 4.2MM (BLADE) ×2 IMPLANT
SLING ARM FOAM STRAP LRG (SOFTGOODS) ×2 IMPLANT
SLING ARM FOAM STRAP MED (SOFTGOODS) IMPLANT
SPONGE LAP 4X18 X RAY DECT (DISPOSABLE) IMPLANT
SUPPORT WRAP ARM LG (MISCELLANEOUS) ×2 IMPLANT
SUT 2 FIBERLOOP 20 STRT BLUE (SUTURE)
SUT ETHILON 3 0 PS 1 (SUTURE) ×2 IMPLANT
SUT TIGER TAPE 7 IN WHITE (SUTURE) IMPLANT
SUTURE 2 FIBERLOOP 20 STRT BLU (SUTURE) IMPLANT
SUTURE TAPE 1.3 40 TPR END (SUTURE) IMPLANT
SUTURETAPE 1.3 40 TPR END (SUTURE)
SYR CONTROL 10ML LL (SYRINGE) ×2 IMPLANT
TAPE FIBER 2MM 7IN #2 BLUE (SUTURE) IMPLANT
TOWEL OR 17X24 6PK STRL BLUE (TOWEL DISPOSABLE) ×2 IMPLANT
TOWEL OR 17X26 10 PK STRL BLUE (TOWEL DISPOSABLE) ×2 IMPLANT
TOWEL OR NON WOVEN STRL DISP B (DISPOSABLE) ×2 IMPLANT
TUBE CONNECTING 12X1/4 (SUCTIONS) ×2 IMPLANT
TUBING ARTHROSCOPY IRRIG 16FT (MISCELLANEOUS) ×2 IMPLANT
WAND HAND CNTRL MULTIVAC 90 (MISCELLANEOUS) ×2 IMPLANT
WATER STERILE IRR 1000ML POUR (IV SOLUTION) ×2 IMPLANT

## 2017-10-31 NOTE — Op Note (Signed)
Procedure(s): Procedure Note  Danielle Braun female 57 y.o. 10/31/2017  Procedure(s) and Anesthesia Type: #1 left shoulder arthroscopic debridement foreign body bursitis and partial-thickness rotator cuff tear #2 left shoulder arthroscopic subacromial decompression  Surgeon(s) and Role:    Tania Ade, MD - Primary     Surgeon: Isabella Stalling   Assistants: Jeanmarie Hubert PA-C [facility] was present and scrubbed throughout the procedure and was essential in positioning, assisting with the camera and instrumentation,, and closure)  Anesthesia: General endotracheal anesthesia with preoperative interscalene block given by the attending anesthesiologist   Procedure Detail  Estimated Blood Loss: Min         Drains: none  Blood Given: none         Specimens: none        Complications:  * No complications entered in OR log *         Disposition: PACU - hemodynamically stable.         Condition: stable    Procedure:   INDICATIONS FOR SURGERY: The patient is 57 y.o. female who had a flu shot in September with significant pain response.  She was noted to have severe subacromial bursitis.  She did not respond to rest anti-inflammatories and subacromial corticosteroid injections and ultimately was indicated for debridement to remove any retained foreign material causing continued inflammatory response.  OPERATIVE FINDINGS: Examination under anesthesia: No stiffness or instability  DESCRIPTION OF PROCEDURE: The patient was identified in preoperative  holding area where I personally marked the operative site after  verifying site, side, and procedure with the patient. An interscalene block was given by the attending anesthesiologist the holding area.  The patient was taken back to the operating room where general anesthesia was induced without complication and was placed in the beach-chair position with the back  elevated about 60 degrees and all extremities and head  and neck carefully padded and  positioned.   The left upper extremity was then prepped and  draped in a standard sterile fashion. The appropriate time-out  procedure was carried out. The patient did receive IV antibiotics  within 30 minutes of incision.   A small posterior portal incision was made and the arthroscope was introduced into the joint.   Examination of the joint revealed the subscapularis to be completely intact.  No significant synovitis in the joint.  The biceps and biceps origin looked good.  Humeral head and glenoid cartilage intact and healthy-appearing.  The arthroscope was then introduced into the subacromial space a standard lateral portal was established with needle localization. The shaver was used through the lateral portal to perform extensive bursectomy. Coracoacromial ligament was examined and found to be frayed indicating some impingement.  No identifiable loose or foreign bodies were seen.  There was a significant inflammatory bursitis.  After resecting the entire bursa bursal side of the rotator cuff was examined and there was an area laterally with some fraying.  This was debrided down to flat healthy tendon with the shaver.  No tuberosity was exposed.  The coracoacromial ligament was taken down off the anterior acromion with the ArthroCare exposing a small anterior acromial spur. A high-speed bur was then used through the lateral portal to take down the anterior acromial spur from lateral to medial in a standard acromioplasty.  The acromioplasty was also viewed from the lateral portal and the bur was used as necessary to ensure that the acromion was completely flat from posterior to anterior.  The arthroscopic equipment was removed  from the joint and the portals were closed with 3-0 nylon in an interrupted fashion. Sterile dressings were then applied including Xeroform 4 x 4's ABDs and tape. The patient was then allowed to awaken from general anesthesia, placed in a  sling, transferred to the stretcher and taken to the recovery room in stable condition.   POSTOPERATIVE PLAN: The patient will be discharged home today and will followup in one week for suture removal and wound check.

## 2017-10-31 NOTE — H&P (Signed)
Danielle Braun is an 57 y.o. female.   Chief Complaint: Left shoulder pain HPI: Patient had a flu shot in September with significant postoperative pain ultimately diagnosed with severe bursitis related to foreign material in the subacromial space.  He failed extensive conservative management with continued pain and wished to go forward with arthroscopic bursectomy to remove any foreign material.  Past Medical History:  Diagnosis Date  . Acne   . Arrhythmia   . Bursitis, subacromial    Left  . Endometriosis   . GERD (gastroesophageal reflux disease)   . Sinusitis     Past Surgical History:  Procedure Laterality Date  . ABDOMINAL HYSTERECTOMY    . ABDOMINAL SURGERY     csection  . DILATION AND CURETTAGE OF UTERUS    . TONSILLECTOMY      Family History  Problem Relation Age of Onset  . Hyperlipidemia Mother   . Cancer Father        bladder  . Diabetes Brother    Social History:  reports that she has quit smoking. she has never used smokeless tobacco. She reports that she drinks alcohol. She reports that she does not use drugs.  Allergies: No Known Allergies  Medications Prior to Admission  Medication Sig Dispense Refill  . acetaminophen (TYLENOL) 500 MG tablet Take 1,000 mg by mouth every 6 (six) hours as needed for moderate pain or headache.    . Cholecalciferol (VITAMIN D3) 2000 units TABS Take 2,000 Units by mouth daily.    . cyclobenzaprine (FLEXERIL) 5 MG tablet Take 5 mg by mouth at bedtime as needed for muscle spasms.     . diclofenac sodium (VOLTAREN) 1 % GEL Apply 1 application topically 4 (four) times daily as needed (for pain).    Marland Kitchen diphenhydrAMINE (BENADRYL) 25 mg capsule Take 25 mg by mouth at bedtime.    Marland Kitchen esomeprazole (NEXIUM) 20 MG capsule Take 20 mg by mouth daily.     . fexofenadine (ALLEGRA) 60 MG tablet Take 60 mg by mouth daily.    . fluticasone (FLONASE) 50 MCG/ACT nasal spray Place 2 sprays into both nostrils daily.     Marland Kitchen guaiFENesin (MUCINEX) 600 MG  12 hr tablet Take 600 mg by mouth 2 (two) times daily.    . meloxicam (MOBIC) 7.5 MG tablet Take 15 mg by mouth daily.    . pseudoephedrine (SUDAFED) 120 MG 12 hr tablet Take 120 mg by mouth daily.    . ranitidine (ZANTAC) 150 MG capsule Take 150 mg by mouth 2 (two) times daily.    . traMADol (ULTRAM) 50 MG tablet Take 50 mg by mouth 2 (two) times daily as needed for moderate pain.       No results found for this or any previous visit (from the past 48 hour(s)). No results found.  Review of Systems  All other systems reviewed and are negative.   Blood pressure (!) 147/90, pulse 87, temperature 97.6 F (36.4 C), temperature source Oral, resp. rate 18, height 5' 5.5" (1.664 m), weight 72 kg (158 lb 11.2 oz), SpO2 99 %. Physical Exam  Constitutional: She is oriented to person, place, and time. She appears well-developed and well-nourished.  HENT:  Head: Atraumatic.  Eyes: EOM are normal.  Cardiovascular: Intact distal pulses.  Respiratory: Effort normal.  Musculoskeletal:  Left shoulder pain with impingement testing. NVID  Neurological: She is alert and oriented to person, place, and time.  Skin: Skin is warm and dry.  Psychiatric: She has a normal  mood and affect.     Assessment/Plan Patient had a flu shot in September with significant postoperative pain ultimately diagnosed with severe bursitis related to foreign material in the subacromial space.  He failed extensive conservative management with continued pain and wished to go forward with arthroscopic bursectomy to remove any foreign material. Risks / benefits of surgery discussed Consent on chart  NPO for OR Preop antibiotics   Isabella Stalling, MD 10/31/2017, 11:26 AM

## 2017-10-31 NOTE — Discharge Instructions (Signed)

## 2017-10-31 NOTE — Transfer of Care (Signed)
Immediate Anesthesia Transfer of Care Note  Patient: Danielle Braun  Procedure(s) Performed: SHOULDER ARTHROSCOPY WITH BURSECTOMY AND SUBACROMIAL DECOMPRESSION (Left )  Patient Location: PACU  Anesthesia Type:GA combined with regional for post-op pain  Level of Consciousness: awake, alert  and oriented  Airway & Oxygen Therapy: Patient Spontanous Breathing and Patient connected to nasal cannula oxygen  Post-op Assessment: Report given to RN, Post -op Vital signs reviewed and stable and Patient moving all extremities  Post vital signs: Reviewed and stable  Last Vitals:  Vitals:   10/31/17 1145 10/31/17 1150  BP: 124/63 120/69  Pulse: 91 81  Resp: 16 15  Temp:    SpO2: 100% 100%    Last Pain:  Vitals:   10/31/17 1009  TempSrc:   PainSc: 2       Patients Stated Pain Goal: 3 (82/80/03 4917)  Complications: No apparent anesthesia complications

## 2017-10-31 NOTE — Anesthesia Procedure Notes (Signed)
Anesthesia Regional Block: Interscalene brachial plexus block   Pre-Anesthetic Checklist: ,, timeout performed, Correct Patient, Correct Site, Correct Laterality, Correct Procedure, Correct Position, site marked, Risks and benefits discussed, pre-op evaluation,  At surgeon's request and post-op pain management  Laterality: Left  Prep: chloraprep       Needles:   Needle Type: Echogenic Needle     Needle Length: 9cm  Needle Gauge: 21     Additional Needles:   Procedures:,,,, ultrasound used (permanent image in chart),,,,  Narrative:  Start time: 10/31/2017 11:53 AM End time: 10/31/2017 11:59 AM Injection made incrementally with aspirations every 5 mL. Anesthesiologist: Lyndle Herrlich, MD

## 2017-10-31 NOTE — Anesthesia Procedure Notes (Signed)
Procedure Name: Intubation Date/Time: 10/31/2017 12:13 PM Performed by: Kyung Rudd, CRNA Pre-anesthesia Checklist: Patient identified, Emergency Drugs available, Suction available and Patient being monitored Patient Re-evaluated:Patient Re-evaluated prior to induction Oxygen Delivery Method: Circle system utilized Preoxygenation: Pre-oxygenation with 100% oxygen Induction Type: IV induction Ventilation: Mask ventilation without difficulty Laryngoscope Size: Mac and 3 Grade View: Grade I Tube type: Oral Tube size: 7.0 mm Number of attempts: 1 Airway Equipment and Method: Stylet Placement Confirmation: ETT inserted through vocal cords under direct vision,  positive ETCO2 and breath sounds checked- equal and bilateral Secured at: 21 cm Tube secured with: Tape Dental Injury: Teeth and Oropharynx as per pre-operative assessment

## 2017-10-31 NOTE — Anesthesia Postprocedure Evaluation (Signed)
Anesthesia Post Note  Patient: Danielle Braun  Procedure(s) Performed: SHOULDER ARTHROSCOPY WITH BURSECTOMY AND SUBACROMIAL DECOMPRESSION (Left )     Patient location during evaluation: PACU Anesthesia Type: General Level of consciousness: awake and alert Pain management: pain level controlled Vital Signs Assessment: post-procedure vital signs reviewed and stable Respiratory status: spontaneous breathing, nonlabored ventilation, respiratory function stable and patient connected to nasal cannula oxygen Cardiovascular status: blood pressure returned to baseline and stable Postop Assessment: no apparent nausea or vomiting Anesthetic complications: no    Last Vitals:  Vitals:   10/31/17 1400 10/31/17 1415  BP:  (!) 157/69  Pulse:  80  Resp:  16  Temp: (!) 36.3 C   SpO2:  99%    Last Pain:  Vitals:   10/31/17 1415  TempSrc:   PainSc: 0-No pain                 Tynia Wiers EDWARD

## 2017-10-31 NOTE — Anesthesia Preprocedure Evaluation (Signed)
Anesthesia Evaluation  Patient identified by MRN, date of birth, ID band Patient awake    Reviewed: Allergy & Precautions, H&P , Patient's Chart, lab work & pertinent test results, reviewed documented beta blocker date and time   Airway Mallampati: II  TM Distance: >3 FB Neck ROM: full    Dental no notable dental hx.    Pulmonary former smoker,    Pulmonary exam normal breath sounds clear to auscultation       Cardiovascular  Rhythm:regular Rate:Normal     Neuro/Psych    GI/Hepatic   Endo/Other    Renal/GU      Musculoskeletal   Abdominal   Peds  Hematology   Anesthesia Other Findings   Reproductive/Obstetrics                             Anesthesia Physical Anesthesia Plan  ASA: II  Anesthesia Plan: General   Post-op Pain Management:  Regional for Post-op pain   Induction: Intravenous  PONV Risk Score and Plan: 2 and Dexamethasone, Ondansetron and Treatment may vary due to age or medical condition  Airway Management Planned: Oral ETT  Additional Equipment:   Intra-op Plan:   Post-operative Plan: Extubation in OR  Informed Consent: I have reviewed the patients History and Physical, chart, labs and discussed the procedure including the risks, benefits and alternatives for the proposed anesthesia with the patient or authorized representative who has indicated his/her understanding and acceptance.   Dental Advisory Given  Plan Discussed with: CRNA and Surgeon  Anesthesia Plan Comments: (  )        Anesthesia Quick Evaluation

## 2017-11-01 ENCOUNTER — Encounter (HOSPITAL_COMMUNITY): Payer: Self-pay | Admitting: Orthopedic Surgery

## 2017-11-04 MED FILL — raNITIdine HCL 150 MG TABS: 150 | 90 days supply | Qty: 180 | Fill #0

## 2017-11-04 MED FILL — traMADol HCL 50 MG TABS: 50 | 5 days supply | Qty: 15 | Fill #1

## 2017-11-12 ENCOUNTER — Encounter: Payer: Self-pay | Admitting: Physical Therapy

## 2017-11-12 ENCOUNTER — Other Ambulatory Visit: Payer: Self-pay

## 2017-11-12 ENCOUNTER — Ambulatory Visit: Payer: PRIVATE HEALTH INSURANCE | Attending: Orthopedic Surgery | Admitting: Physical Therapy

## 2017-11-12 DIAGNOSIS — R293 Abnormal posture: Secondary | ICD-10-CM | POA: Insufficient documentation

## 2017-11-12 DIAGNOSIS — R29898 Other symptoms and signs involving the musculoskeletal system: Secondary | ICD-10-CM | POA: Diagnosis present

## 2017-11-12 DIAGNOSIS — M6281 Muscle weakness (generalized): Secondary | ICD-10-CM | POA: Insufficient documentation

## 2017-11-12 DIAGNOSIS — M25512 Pain in left shoulder: Secondary | ICD-10-CM | POA: Diagnosis not present

## 2017-11-12 DIAGNOSIS — M25612 Stiffness of left shoulder, not elsewhere classified: Secondary | ICD-10-CM | POA: Diagnosis present

## 2017-11-12 NOTE — Patient Instructions (Signed)
Flexion (Eccentric) - Active (Cane)   Lift cane with both hands. Avoid hiking shoulders. Lower cane slowly for 3-5 seconds. __15_ reps per set, _2__ sets per day  Cane Exercise: Abduction   Hold cane with right hand over end, palm-up, with other hand palm-down. Move arm out from side and up by pushing with other arm. Hold __5-10__ seconds. Repeat __15__ times. Do __2__ sessions per day.  Strengthening: Isometric Flexion  Using wall for resistance, press right fist into ball using light pressure. Hold __5__ seconds. Repeat __10-15__ times per set. Do __2__ sets per session.   SHOULDER: Abduction (Isometric)  Use wall as resistance. Press arm against pillow. Keep elbow straight. Hold __5_ seconds. __10-15_ reps per set, _2__ sets per day  Extension (Isometric)  Place left bent elbow and back of arm against wall. Press elbow against wall. Hold _5___ seconds. Repeat __10-15__ times. Do _2___ sessions per day.  Internal Rotation (Isometric)  Place palm of right fist against door frame, with elbow bent. Press fist against door frame. Hold __5__ seconds. Repeat __10-15__ times. Do __2__ sessions per day.  External Rotation (Isometric)  Place back of left fist against door frame, with elbow bent. Press fist against door frame. Hold __5__ seconds. Repeat _10-15___ times. Do __2__ sessions per day.

## 2017-11-12 NOTE — Therapy (Signed)
[facility] [address]  [place], [place], [address] Phone: [phone]   Fax:  [phone]  Physical Therapy Evaluation  Patient Details  Name: Danielle Braun MRN: [id number] Date of Birth: [birth year] Referring Provider: Dr. Tania Ade   Encounter Date: 11/12/2017  PT End of Session - 11/12/17 0924    Visit Number  1    Number of Visits  9    Date for PT Re-Evaluation  12/17/17    Authorization Type  WC    Authorization - Visit Number  1    Authorization - Number of Visits  9    PT Start Time  [phone]    PT Stop Time  0929    PT Time Calculation (min)  47 min    Activity Tolerance  Patient tolerated treatment well    Behavior During Therapy  [facility] for tasks assessed/performed       Past Medical History:  Diagnosis Date  . Acne   . Arrhythmia   . Bursitis, subacromial    Left  . Endometriosis   . GERD (gastroesophageal reflux disease)   . Sinusitis     Past Surgical History:  Procedure Laterality Date  . ABDOMINAL HYSTERECTOMY    . ABDOMINAL SURGERY     csection  . DILATION AND CURETTAGE OF UTERUS    . SHOULDER ARTHROSCOPY WITH SUBACROMIAL DECOMPRESSION Left 10/31/2017   Procedure: SHOULDER ARTHROSCOPY WITH BURSECTOMY AND SUBACROMIAL DECOMPRESSION;  Surgeon: Tania Ade, MD;  Location: [facility];  Service: Orthopedics;  Laterality: Left;  Left shoulder arthroscopy bursectomy and subacromial decompression  . TONSILLECTOMY      There were no vitals filed for this visit.   Subjective Assessment - 11/12/17 0844    Subjective  s/p L shoulder arthroscopic debribement bursitis, SAD on 10/31/2017 - symptoms started post flu shot. nothing seemed to be helping with conservative treatment. Did steroid injection x 2 with little relief. Had MRI. Was in sling for 72 hours. Has been trying to do PROM/AAROM. Does have sharp pains from time to time. Reports partial RTC tear of which was cleaned up.     Diagnostic  tests  MRI: Supraspinatus and infraspinatus tendinopathy without tear. Prominent subacromial/subdeltoid fluid consistent with bursitis. Mild acromioclavicular osteoarthritis - prior to surgery    Patient Stated Goals  prevent disability and pain, restore function and ROM, eliminate pain    Currently in Pain?  Yes    Pain Score  6  with movement    Pain Location  Shoulder    Pain Orientation  Right    Pain Descriptors / Indicators  Sharp    Pain Type  Chronic pain    Aggravating Factors   movements    Pain Relieving Factors  ice pack, mobic         [facility] PT Assessment - 11/12/17 0900      Assessment   Medical Diagnosis  s/p L shoulder arthroscopic debridement bursitis, SAD    Referring Provider  Dr. Tania Ade    Onset Date/Surgical Date  10/31/17    Next MD Visit  12/02/17    Prior Therapy  yes      Precautions   Precautions  None      Restrictions   Weight Bearing Restrictions  No      Balance Screen   Has the patient fallen in the past 6 months  No    Has the patient had a decrease in activity level because  of a fear of falling?   No    Is the patient reluctant to leave their home because of a fear of falling?   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      Cognition   Overall Cognitive Status  Within Functional Limits for tasks assessed      Observation/Other Assessments   Focus on Therapeutic Outcomes (FOTO)   Shoulder: 54 (46% limited, predicted 32% limited)      Sensation   Light Touch  Appears Intact      Coordination   Gross Motor Movements are Fluid and Coordinated  Yes      Posture/Postural Control   Posture/Postural Control  Postural limitations    Postural Limitations  Rounded Shoulders      ROM / Strength   AROM / PROM / Strength   AROM;PROM;Strength      AROM   AROM Assessment Site  Shoulder    Right/Left Shoulder  Left    Left Shoulder Flexion  145 Degrees    Left Shoulder ABduction  140 Degrees    Left Shoulder Internal Rotation  -- FIR to ~T10    Left Shoulder External Rotation  -- FER to ~C6      PROM   Overall PROM Comments  L shoulder - full with pain during ER PROM into end ranges      Strength   Strength Assessment Site  Shoulder    Right/Left Shoulder  Right;Left    Right Shoulder Flexion  4+/5    Right Shoulder ABduction  4+/5    Right Shoulder Internal Rotation  4+/5    Right Shoulder External Rotation  4+/5    Left Shoulder Flexion  4-/5    Left Shoulder ABduction  4/5    Left Shoulder Internal Rotation  4/5    Left Shoulder External Rotation  4-/5      Palpation   Palpation comment  TTP - L UT, L AC joint line, L posterior/anterior shoulder complex             Objective measurements completed on examination: See above findings.      [facility] Adult PT Treatment/Exercise - 11/12/17 0900      Exercises   Exercises  Shoulder      Shoulder Exercises: Seated   Flexion  AAROM;Left;10 reps    Abduction  AAROM;Left;10 reps      Shoulder Exercises: Isometric Strengthening   Flexion  5X5"    Extension  5X5"    External Rotation  5X5"    Internal Rotation  5X5"    ABduction  5X5"             PT Education - 11/12/17 0923    Education provided  Yes    Education Details  exam findings, POC, HEP    Person(s) Educated  Patient    Methods  Explanation;Demonstration;Handout    Comprehension  Verbalized understanding;Returned demonstration       PT Short Term Goals - 11/12/17 1042      PT SHORT TERM GOAL #1   Title  patient to be independent with initial HEP    Status  New    Target Date  11/26/17  PT SHORT TERM GOAL #2   Title  patient to improve L shoulder AROM equal to that of R shoulder without pain limiting    Status  New    Target Date  11/26/17        PT  Long Term Goals - 11/12/17 1043      PT LONG TERM GOAL #1   Title  patient to be independent with advanced HEP    Status  New    Target Date  12/24/17      PT LONG TERM GOAL #2   Title  patient to improve L UE strength to >/= 4+/5 for improved function    Status  New    Target Date  12/24/17      PT LONG TERM GOAL #3   Title  patient to report ability to perform ADLs and light household tasks without pain limiting    Status  New    Target Date  12/24/17      PT LONG TERM GOAL #4   Title  Patient to report pain no greater than 2/10 for greater than 2 weeks    Status  New    Target Date  12/24/17             Plan - 11/12/17 1034    Clinical Impression Statement  Ms. Tercero is a pleasant 57 y/o female presenting to [facility] today s/p L shoulder arthroscopic debridement bursitis, SAD on 10/31/17 due to significant pain response following flu shot. Patient today with near full AROM of L shoulder, however some pain limiting at end range. Strength deficits also noted, but expected following surgery. Patient with primary concerns regarding possible development of chronic pain and disability at L shoulder. HEP initiated today for gentle stretching and strengthening with good carryover and tolerance. Patient to benefit from PT to address pain and functional limtiations at L UE.     Clinical Presentation  Stable    Clinical Decision Making  Low    Rehab Potential  Good    PT Frequency  2x / week    PT Duration  6 weeks    PT Treatment/Interventions  ADLs/Self Care Home Management;Cryotherapy;Electrical Stimulation;Iontophoresis 4mg /ml Dexamethasone;Moist Heat;Therapeutic activities;Therapeutic exercise;Ultrasound;Neuromuscular re-education;Patient/family education;Manual techniques;Vasopneumatic Device;Taping;Dry needling;Passive range of motion    Consulted and Agree with Plan of Care  Patient       Patient will benefit from skilled therapeutic intervention in order to improve the following  deficits and impairments:  Pain, Impaired UE functional use, Decreased strength, Decreased range of motion, Decreased activity tolerance  Visit Diagnosis: Acute pain of left shoulder - Plan: PT plan of care cert/re-cert  Stiffness of left shoulder, not elsewhere classified - Plan: PT plan of care cert/re-cert  Abnormal posture - Plan: PT plan of care cert/re-cert  Muscle weakness (generalized) - Plan: PT plan of care cert/re-cert  Other symptoms and signs involving the musculoskeletal system - Plan: PT plan of care cert/re-cert     Problem List Patient Active Problem List   Diagnosis Date Noted  . ANKLE SPRAIN, LEFT 04/10/2009  . ACUTE MAXILLARY SINUSITIS 11/06/2008  . URI 11/06/2008     Lanney Gins, PT, DPT 11/12/17 10:50 AM   [facility] [address]  [place], [place], [address] Phone: [phone]   Fax:  [phone]  Name: Danielle Braun MRN: [id number] Date of Birth: 1961-10-21

## 2017-11-14 ENCOUNTER — Encounter: Payer: Self-pay | Admitting: Physical Therapy

## 2017-11-14 ENCOUNTER — Ambulatory Visit: Payer: PRIVATE HEALTH INSURANCE | Admitting: Physical Therapy

## 2017-11-14 DIAGNOSIS — R293 Abnormal posture: Secondary | ICD-10-CM

## 2017-11-14 DIAGNOSIS — M25512 Pain in left shoulder: Secondary | ICD-10-CM | POA: Diagnosis not present

## 2017-11-14 DIAGNOSIS — M25612 Stiffness of left shoulder, not elsewhere classified: Secondary | ICD-10-CM

## 2017-11-14 DIAGNOSIS — M6281 Muscle weakness (generalized): Secondary | ICD-10-CM

## 2017-11-14 DIAGNOSIS — R29898 Other symptoms and signs involving the musculoskeletal system: Secondary | ICD-10-CM

## 2017-11-14 NOTE — Patient Instructions (Signed)
Strengthening: Resisted External Rotation   Hold tubing in right hand, elbow at side and forearm across body. Rotate forearm out. Repeat _10-15___ times per set.    Strengthening: Resisted Internal Rotation   Hold tubing in left hand, elbow at side and forearm out. Rotate forearm in across body. Repeat __10-15__ times per set.   Resistive Band Rowing   With resistive band anchored in door, grasp both ends. Keeping elbows bent, pull back, squeezing shoulder blades together. Hold __5__ seconds. Repeat __10-15__ times.

## 2017-11-14 NOTE — Therapy (Signed)
[facility] [address]  [place], [place], [address] Phone: [phone]   Fax:  [phone]  Physical Therapy Treatment  Patient Details  Name: Danielle Braun MRN: [id number] Date of Birth: 1961/02/21 Referring Provider: Dr. Tania Ade   Encounter Date: 11/14/2017  PT End of Session - 11/14/17 0928    Visit Number  2    Number of Visits  9    Date for PT Re-Evaluation  12/17/17    Authorization Type  WC    Authorization - Visit Number  2    Authorization - Number of Visits  9    PT Start Time  [phone]    PT Stop Time  1016 ice pack    PT Time Calculation (min)  52 min    Activity Tolerance  Patient tolerated treatment well    Behavior During Therapy  [facility] for tasks assessed/performed       Past Medical History:  Diagnosis Date  . Acne   . Arrhythmia   . Bursitis, subacromial    Left  . Endometriosis   . GERD (gastroesophageal reflux disease)   . Sinusitis     Past Surgical History:  Procedure Laterality Date  . ABDOMINAL HYSTERECTOMY    . ABDOMINAL SURGERY     csection  . DILATION AND CURETTAGE OF UTERUS    . SHOULDER ARTHROSCOPY WITH SUBACROMIAL DECOMPRESSION Left 10/31/2017   Procedure: SHOULDER ARTHROSCOPY WITH BURSECTOMY AND SUBACROMIAL DECOMPRESSION;  Surgeon: Tania Ade, MD;  Location: [facility];  Service: Orthopedics;  Laterality: Left;  Left shoulder arthroscopy bursectomy and subacromial decompression  . TONSILLECTOMY      There were no vitals filed for this visit.  Subjective Assessment - 11/14/17 0925    Subjective  feels like she is having more pronounced muscle spasms - taking robaxin and flexeril    Diagnostic tests  MRI: Supraspinatus and infraspinatus tendinopathy without tear. Prominent subacromial/subdeltoid fluid consistent with bursitis. Mild acromioclavicular osteoarthritis - prior to surgery    Patient Stated Goals  prevent disability and pain, restore function and ROM,  eliminate pain    Currently in Pain?  Yes    Pain Score  4     Pain Location  Shoulder    Pain Orientation  Right    Pain Descriptors / Indicators  Sharp    Pain Type  Acute pain;Surgical pain                      [facility] Adult PT Treatment/Exercise - 11/14/17 0950      Shoulder Exercises: Standing   External Rotation  Strengthening;Left;15 reps;Theraband    Theraband Level (Shoulder External Rotation)  Level 1 (Yellow)    Extension  Strengthening;Both;15 reps;Theraband    Theraband Level (Shoulder Extension)  Level 1 (Yellow)    Row  Strengthening;Both;15 reps;Theraband    Theraband Level (Shoulder Row)  Level 1 (Yellow)      Shoulder Exercises: ROM/Strengthening   UBE (Upper Arm Bike)  L2 x 6 min (3/3)    Wall Pushups  10 reps gentle - low range      Shoulder Exercises: Stretch   Other Shoulder Stretches  L UT stretch 2 x 30 seconds      Modalities   Modalities  Cryotherapy      Cryotherapy   Number Minutes Cryotherapy  10 Minutes    Cryotherapy Location  Shoulder L    Type of Cryotherapy  Ice pack  Manual Therapy   Manual Therapy  Soft tissue mobilization;Passive ROM    Manual therapy comments  patient supine    Soft tissue mobilization  STM - light and deep to L shoulder complex - taut bands noted throughout    Passive ROM  L GH joint - all planes - full and no pain following STM               PT Short Term Goals - 11/14/17 3300      PT SHORT TERM GOAL #1   Title  patient to be independent with initial HEP    Status  On-going      PT SHORT TERM GOAL #2   Title  patient to improve L shoulder AROM equal to that of R shoulder without pain limiting    Status  On-going        PT Long Term Goals - 11/14/17 0929      PT LONG TERM GOAL #1   Title  patient to be independent with advanced HEP    Status  On-going      PT LONG TERM GOAL #2   Title  patient to improve L UE strength to >/= 4+/5 for improved function    Status  On-going       PT LONG TERM GOAL #3   Title  patient to report ability to perform ADLs and light household tasks without pain limiting    Status  On-going      PT LONG TERM GOAL #4   Title  Patient to report pain no greater than 2/10 for greater than 2 weeks    Status  On-going            Plan - 11/14/17 0929    Clinical Impression Statement  [name] doing well today - tolerable to all STM at L shoulder complex and able to achieve full PROM in all planes without pain following STM. Progressing to more active muscle activation today with yellow tband with good tolerance. Did ice to end session for pain and inflammation control.     PT Treatment/Interventions  ADLs/Self Care Home Management;Cryotherapy;Electrical Stimulation;Iontophoresis 4mg /ml Dexamethasone;Moist Heat;Therapeutic activities;Therapeutic exercise;Ultrasound;Neuromuscular re-education;Patient/family education;Manual techniques;Vasopneumatic Device;Taping;Dry needling;Passive range of motion    Consulted and Agree with Plan of Care  Patient       Patient will benefit from skilled therapeutic intervention in order to improve the following deficits and impairments:  Pain, Impaired UE functional use, Decreased strength, Decreased range of motion, Decreased activity tolerance  Visit Diagnosis: Acute pain of left shoulder  Stiffness of left shoulder, not elsewhere classified  Abnormal posture  Muscle weakness (generalized)  Other symptoms and signs involving the musculoskeletal system     Problem List Patient Active Problem List   Diagnosis Date Noted  . ANKLE SPRAIN, LEFT 04/10/2009  . ACUTE MAXILLARY SINUSITIS 11/06/2008  . URI 11/06/2008     Lanney Gins, PT, DPT 11/14/17 11:49 AM   [facility] [address]  [place], [place], [address] Phone: [phone]   Fax:  [phone]  Name: TREMAINE FUHRIMAN MRN: [id number] Date of Birth: Dec 28, 1960

## 2017-11-18 ENCOUNTER — Encounter: Payer: Self-pay | Admitting: Physical Therapy

## 2017-11-18 ENCOUNTER — Ambulatory Visit: Payer: PRIVATE HEALTH INSURANCE | Admitting: Physical Therapy

## 2017-11-18 DIAGNOSIS — M25512 Pain in left shoulder: Secondary | ICD-10-CM

## 2017-11-18 DIAGNOSIS — M25612 Stiffness of left shoulder, not elsewhere classified: Secondary | ICD-10-CM

## 2017-11-18 DIAGNOSIS — R293 Abnormal posture: Secondary | ICD-10-CM

## 2017-11-18 DIAGNOSIS — R29898 Other symptoms and signs involving the musculoskeletal system: Secondary | ICD-10-CM

## 2017-11-18 DIAGNOSIS — M6281 Muscle weakness (generalized): Secondary | ICD-10-CM

## 2017-11-18 NOTE — Therapy (Signed)
[facility] [address]  [place], [place], [address] Phone: [phone]   Fax:  [phone]  Physical Therapy Treatment  Patient Details  Name: Danielle Braun MRN: [id number] Date of Birth: 18-Aug-1961 Referring Provider: Dr. Tania Ade   Encounter Date: 11/18/2017  PT End of Session - 11/18/17 0923    Visit Number  3    Number of Visits  9    Date for PT Re-Evaluation  12/17/17    Authorization Type  WC    Authorization - Visit Number  3    Authorization - Number of Visits  9    PT Start Time  0918    PT Stop Time  1013 ice pack    PT Time Calculation (min)  55 min    Activity Tolerance  Patient tolerated treatment well    Behavior During Therapy  [facility] for tasks assessed/performed       Past Medical History:  Diagnosis Date  . Acne   . Arrhythmia   . Bursitis, subacromial    Left  . Endometriosis   . GERD (gastroesophageal reflux disease)   . Sinusitis     Past Surgical History:  Procedure Laterality Date  . ABDOMINAL HYSTERECTOMY    . ABDOMINAL SURGERY     csection  . DILATION AND CURETTAGE OF UTERUS    . SHOULDER ARTHROSCOPY WITH SUBACROMIAL DECOMPRESSION Left 10/31/2017   Procedure: SHOULDER ARTHROSCOPY WITH BURSECTOMY AND SUBACROMIAL DECOMPRESSION;  Surgeon: Tania Ade, MD;  Location: [facility];  Service: Orthopedics;  Laterality: Left;  Left shoulder arthroscopy bursectomy and subacromial decompression  . TONSILLECTOMY      There were no vitals filed for this visit.  Subjective Assessment - 11/18/17 0921    Subjective  had some arm pain yesterday; felt likeshe was a little swollen this weekend    Diagnostic tests  MRI: Supraspinatus and infraspinatus tendinopathy without tear. Prominent subacromial/subdeltoid fluid consistent with bursitis. Mild acromioclavicular osteoarthritis - prior to surgery    Patient Stated Goals  prevent disability and pain, restore function and ROM, eliminate  pain    Currently in Pain?  Yes    Pain Score  3     Pain Location  Shoulder    Pain Orientation  Left    Pain Descriptors / Indicators  Discomfort;Throbbing    Pain Type  Acute pain;Surgical pain                      [facility] Adult PT Treatment/Exercise - 11/18/17 0924      Shoulder Exercises: Prone   Retraction  Strengthening;Left;15 reps;Weights prone row on mat table    Retraction Weight (lbs)  1    Extension  Strengthening;Left;15 reps;Weights prone I on mat table    Extension Weight (lbs)  1    Horizontal ABduction 1  Strengthening;Left;10 reps prone T on mat table      Shoulder Exercises: Sidelying   External Rotation  Strengthening;Left;15 reps;Weights towel at thorax    External Rotation Weight (lbs)  1    Flexion  AROM;Left;15 reps    ABduction  AROM;Left;10 reps      Shoulder Exercises: ROM/Strengthening   UBE (Upper Arm Bike)  L2.5 x 6 min (3/3)    Cybex Row  15 reps 15# - narrow grip    Wall Pushups  10 reps + plus    Ball on Wall  1# at L wrist - walkign orange pball up wall  x 12 reps    Other ROM/Strengthening Exercises  cabinet reaches: flexion x 10, abduction x 10 - no weight      Modalities   Modalities  Cryotherapy      Cryotherapy   Number Minutes Cryotherapy  10 Minutes    Cryotherapy Location  Shoulder    Type of Cryotherapy  Ice pack      Manual Therapy   Manual Therapy  Soft tissue mobilization    Manual therapy comments  patient hooklying    Soft tissue mobilization  STM to L UT and L shoulder complex - improved tissue tightness, come continued tenderness               PT Short Term Goals - 11/14/17 4196      PT SHORT TERM GOAL #1   Title  patient to be independent with initial HEP    Status  On-going      PT SHORT TERM GOAL #2   Title  patient to improve L shoulder AROM equal to that of R shoulder without pain limiting    Status  On-going        PT Long Term Goals - 11/14/17 0929      PT LONG TERM GOAL #1    Title  patient to be independent with advanced HEP    Status  On-going      PT LONG TERM GOAL #2   Title  patient to improve L UE strength to >/= 4+/5 for improved function    Status  On-going      PT LONG TERM GOAL #3   Title  patient to report ability to perform ADLs and light household tasks without pain limiting    Status  On-going      PT LONG TERM GOAL #4   Title  Patient to report pain no greater than 2/10 for greater than 2 weeks    Status  On-going            Plan - 11/18/17 2229    Clinical Impression Statement  Patient tolerable to all progressions in strength training. Does conitnue to report some referred pain into L bicep region - will plan to do possible KT taping at next session depending on symtpoms following treatment today. Continued encouragement to perform AROM and continued strengthening at L shoulder.     PT Treatment/Interventions  ADLs/Self Care Home Management;Cryotherapy;Electrical Stimulation;Iontophoresis 4mg /ml Dexamethasone;Moist Heat;Therapeutic activities;Therapeutic exercise;Ultrasound;Neuromuscular re-education;Patient/family education;Manual techniques;Vasopneumatic Device;Taping;Dry needling;Passive range of motion    Consulted and Agree with Plan of Care  Patient       Patient will benefit from skilled therapeutic intervention in order to improve the following deficits and impairments:  Pain, Impaired UE functional use, Decreased strength, Decreased range of motion, Decreased activity tolerance  Visit Diagnosis: Acute pain of left shoulder  Stiffness of left shoulder, not elsewhere classified  Abnormal posture  Muscle weakness (generalized)  Other symptoms and signs involving the musculoskeletal system     Problem List Patient Active Problem List   Diagnosis Date Noted  . ANKLE SPRAIN, LEFT 04/10/2009  . ACUTE MAXILLARY SINUSITIS 11/06/2008  . URI 11/06/2008    Lanney Gins, PT, DPT 11/18/17 10:15 AM   [facility] [address]  [place], [place], [address] Phone: [phone]   Fax:  [phone]  Name: Danielle Braun MRN: [id number] Date of Birth: [birth year]

## 2017-11-21 ENCOUNTER — Encounter: Payer: Self-pay | Admitting: Physical Therapy

## 2017-11-21 ENCOUNTER — Ambulatory Visit: Payer: PRIVATE HEALTH INSURANCE | Admitting: Physical Therapy

## 2017-11-21 DIAGNOSIS — M25512 Pain in left shoulder: Secondary | ICD-10-CM | POA: Diagnosis not present

## 2017-11-21 DIAGNOSIS — R29898 Other symptoms and signs involving the musculoskeletal system: Secondary | ICD-10-CM

## 2017-11-21 DIAGNOSIS — M25612 Stiffness of left shoulder, not elsewhere classified: Secondary | ICD-10-CM

## 2017-11-21 DIAGNOSIS — M6281 Muscle weakness (generalized): Secondary | ICD-10-CM

## 2017-11-21 DIAGNOSIS — R293 Abnormal posture: Secondary | ICD-10-CM

## 2017-11-21 NOTE — Therapy (Signed)
[facility] [address]  [place], [place], [address] Phone: [phone]   Fax:  [phone]  Physical Therapy Treatment  Patient Details  Name: Danielle Braun MRN: [id number] Date of Birth: 1961/09/28 Referring Provider: Dr. Tania Ade   Encounter Date: 11/21/2017  PT End of Session - 11/21/17 0917    Visit Number  4    Number of Visits  9    Date for PT Re-Evaluation  12/17/17    Authorization Type  WC    Authorization - Visit Number  4    Authorization - Number of Visits  9    PT Start Time  1829    PT Stop Time  0841    PT Time Calculation (min)  42 min    Activity Tolerance  Patient tolerated treatment well    Behavior During Therapy  [facility] for tasks assessed/performed       Past Medical History:  Diagnosis Date  . Acne   . Arrhythmia   . Bursitis, subacromial    Left  . Endometriosis   . GERD (gastroesophageal reflux disease)   . Sinusitis     Past Surgical History:  Procedure Laterality Date  . ABDOMINAL HYSTERECTOMY    . ABDOMINAL SURGERY     csection  . DILATION AND CURETTAGE OF UTERUS    . SHOULDER ARTHROSCOPY WITH SUBACROMIAL DECOMPRESSION Left 10/31/2017   Procedure: SHOULDER ARTHROSCOPY WITH BURSECTOMY AND SUBACROMIAL DECOMPRESSION;  Surgeon: Tania Ade, MD;  Location: [facility];  Service: Orthopedics;  Laterality: Left;  Left shoulder arthroscopy bursectomy and subacromial decompression  . TONSILLECTOMY      There were no vitals filed for this visit.  Subjective Assessment - 11/21/17 0802    Subjective  lingering pain in R bicep/deltoid region    Diagnostic tests  MRI: Supraspinatus and infraspinatus tendinopathy without tear. Prominent subacromial/subdeltoid fluid consistent with bursitis. Mild acromioclavicular osteoarthritis - prior to surgery    Patient Stated Goals  prevent disability and pain, restore function and ROM, eliminate pain    Currently in Pain?  Yes    Pain  Score  3     Pain Location  Shoulder    Pain Orientation  Left    Pain Descriptors / Indicators  Aching;Sore    Pain Type  Acute pain;Surgical pain                      [facility] Adult PT Treatment/Exercise - 11/21/17 0001      Shoulder Exercises: Standing   Horizontal ABduction  Strengthening;Both;15 reps;Theraband    Theraband Level (Shoulder Horizontal ABduction)  Level 2 (Red) B + scap squeeze    External Rotation  Strengthening;Both;15 reps;Theraband    Theraband Level (Shoulder External Rotation)  Level 2 (Red) elbows by side - B + scap squeeze      Shoulder Exercises: ROM/Strengthening   UBE (Upper Arm Bike)  L3 x 6 min (3/3)    Cybex Row  15 reps 10# - narrow grip      Modalities   Modalities  Iontophoresis      Iontophoresis   Type of Iontophoresis  Dexamethasone    Location  L AC joint line    Dose  1.0 mL    Time  4-6 hours; 80 mA      Manual Therapy   Manual Therapy  Soft tissue mobilization    Manual therapy comments  patient prone    Soft tissue mobilization  superficial and deep STM to L UT, L lats, L infrapsinats, L teres group - trigger points noted       Trigger Point Dry Needling - 11/21/17 0844    Consent Given?  Yes    Education Handout Provided  Yes    Muscles Treated Upper Body  Upper trapezius;Infraspinatus lats    Upper Trapezius Response  Twitch reponse elicited;Palpable increased muscle length    Infraspinatus Response  Twitch response elicited;Palpable increased muscle length             PT Short Term Goals - 11/14/17 5329      PT SHORT TERM GOAL #1   Title  patient to be independent with initial HEP    Status  On-going      PT SHORT TERM GOAL #2   Title  patient to improve L shoulder AROM equal to that of R shoulder without pain limiting    Status  On-going        PT Long Term Goals - 11/14/17 0929      PT LONG TERM GOAL #1   Title  patient to be independent with advanced HEP    Status  On-going      PT LONG  TERM GOAL #2   Title  patient to improve L UE strength to >/= 4+/5 for improved function    Status  On-going      PT LONG TERM GOAL #3   Title  patient to report ability to perform ADLs and light household tasks without pain limiting    Status  On-going      PT LONG TERM GOAL #4   Title  Patient to report pain no greater than 2/10 for greater than 2 weeks    Status  On-going            Plan - 11/21/17 0917    Clinical Impression Statement  Patient with continuing lingering pain at L bicep region. Patient open to trial of DN today with excellent twitch response noted in all areas needling performed today. Patient tolerable to [facility] and strengthening ther ex post needling with no great increase in pain. Ionto patch applied for hopeful pain relief at L shoulder complex.     PT Treatment/Interventions  ADLs/Self Care Home Management;Cryotherapy;Electrical Stimulation;Iontophoresis 4mg /ml Dexamethasone;Moist Heat;Therapeutic activities;Therapeutic exercise;Ultrasound;Neuromuscular re-education;Patient/family education;Manual techniques;Vasopneumatic Device;Taping;Dry needling;Passive range of motion    Consulted and Agree with Plan of Care  Patient       Patient will benefit from skilled therapeutic intervention in order to improve the following deficits and impairments:  Pain, Impaired UE functional use, Decreased strength, Decreased range of motion, Decreased activity tolerance  Visit Diagnosis: Acute pain of left shoulder  Stiffness of left shoulder, not elsewhere classified  Abnormal posture  Muscle weakness (generalized)  Other symptoms and signs involving the musculoskeletal system     Problem List Patient Active Problem List   Diagnosis Date Noted  . ANKLE SPRAIN, LEFT 04/10/2009  . ACUTE MAXILLARY SINUSITIS 11/06/2008  . URI 11/06/2008     Lanney Gins, PT, DPT 11/21/17 9:21 AM   [facility] [address]  [place], [place], [address] Phone: [phone]   Fax:  [phone]  Name: PIERRA SKORA MRN: [id number] Date of Birth: 11-Apr-1961

## 2017-11-21 NOTE — Patient Instructions (Signed)
Trigger Point Dry Needling  . What is Trigger Point Dry Needling (DN)? o DN is a physical therapy technique used to treat muscle pain and dysfunction. Specifically, DN helps deactivate muscle trigger points (muscle knots).  o A thin filiform needle is used to penetrate the skin and stimulate the underlying trigger point. The goal is for a local twitch response (LTR) to occur and for the trigger point to relax. No medication of any kind is injected during the procedure.   . What Does Trigger Point Dry Needling Feel Like?  o The procedure feels different for each individual patient. Some patients report that they do not actually feel the needle enter the skin and overall the process is not painful. Very mild bleeding may occur. However, many patients feel a deep cramping in the muscle in which the needle was inserted. This is the local twitch response.   . How Will I feel after the treatment? o Soreness is normal, and the onset of soreness may not occur for a few hours. Typically this soreness does not last longer than two days.  o Bruising is uncommon, however; ice can be used to decrease any possible bruising.  o In rare cases feeling tired or nauseous after the treatment is normal. In addition, your symptoms may get worse before they get better, this period will typically not last longer than 24 hours.   . What Can I do After My Treatment? o Increase your hydration by drinking more water for the next 24 hours. o You may place ice or heat on the areas treated that have become sore, however, do not use heat on inflamed or bruised areas. Heat often brings more relief post needling. o You can continue your regular activities, but vigorous activity is not recommended initially after the treatment for 24 hours. o DN is best combined with other physical therapy such as strengthening, stretching, and other therapies.  IONTOPHORESIS PATIENT PRECAUTIONS & CONTRAINDICATIONS:  . Redness under one or both  electrodes can occur.  This characterized by a uniform redness that usually disappears within 12 hours of treatment. . Small pinhead size blisters may result in response to the drug.  Contact your physician if the problem persists more than 24 hours. . On rare occasions, iontophoresis therapy can result in temporary skin reactions such as rash, inflammation, irritation or burns.  The skin reactions may be the result of individual sensitivity to the ionic solution used, the condition of the skin at the start of treatment, reaction to the materials in the electrodes, allergies or sensitivity to dexamethasone, or a poor connection between the patch and your skin.  Discontinue using iontophoresis if you have any of these reactions and report to your therapist. . Remove the Patch or electrodes if you have any undue sensation of pain or burning during the treatment and report discomfort to your therapist. . Tell your Therapist if you have had known adverse reactions to the application of electrical current. . If using the Patch, the LED light will turn off when treatment is complete and the patch can be removed.  Approximate treatment time is 1-3 hours.  Remove the patch when light goes off or after 6 hours. . The Patch can be worn during normal activity, however excessive motion where the electrodes have been placed can cause poor contact between the skin and the electrode or uneven electrical current resulting in greater risk of skin irritation. . Keep out of the reach of children.   . DO   NOT use if you have a cardiac pacemaker or any other electrically sensitive implanted device. . DO NOT use if you have a known sensitivity to dexamethasone. . DO NOT use during Magnetic Resonance Imaging (MRI). . DO NOT use over broken or compromised skin (e.g. sunburn, cuts, or acne) due to the increased risk of skin reaction. . DO NOT SHAVE over the area to be treated:  To establish good contact between the Patch  and the skin, excessive hair may be clipped. . DO NOT place the Patch or electrodes on or over your eyes, directly over your heart, or brain. . DO NOT reuse the Patch or electrodes as this may cause burns to occur.  

## 2017-11-26 ENCOUNTER — Emergency Department (INDEPENDENT_AMBULATORY_CARE_PROVIDER_SITE_OTHER)
Admission: EM | Admit: 2017-11-26 | Discharge: 2017-11-26 | Disposition: A | Discharge status: Home / Self Care | Payer: Self-pay | Source: Home / Self Care | Attending: Family Medicine | Admitting: Family Medicine

## 2017-11-26 ENCOUNTER — Other Ambulatory Visit: Payer: Self-pay

## 2017-11-26 ENCOUNTER — Encounter: Payer: Self-pay | Admitting: Emergency Medicine

## 2017-11-26 ENCOUNTER — Ambulatory Visit: Payer: PRIVATE HEALTH INSURANCE | Admitting: Physical Therapy

## 2017-11-26 ENCOUNTER — Emergency Department (INDEPENDENT_AMBULATORY_CARE_PROVIDER_SITE_OTHER): Payer: 59

## 2017-11-26 ENCOUNTER — Encounter: Payer: Self-pay | Admitting: Physical Therapy

## 2017-11-26 DIAGNOSIS — M542 Cervicalgia: Secondary | ICD-10-CM | POA: Diagnosis not present

## 2017-11-26 DIAGNOSIS — M6281 Muscle weakness (generalized): Secondary | ICD-10-CM

## 2017-11-26 DIAGNOSIS — M25512 Pain in left shoulder: Secondary | ICD-10-CM | POA: Diagnosis not present

## 2017-11-26 DIAGNOSIS — S161XXA Strain of muscle, fascia and tendon at neck level, initial encounter: Secondary | ICD-10-CM

## 2017-11-26 DIAGNOSIS — M25511 Pain in right shoulder: Secondary | ICD-10-CM | POA: Diagnosis not present

## 2017-11-26 DIAGNOSIS — S4991XA Unspecified injury of right shoulder and upper arm, initial encounter: Secondary | ICD-10-CM | POA: Diagnosis not present

## 2017-11-26 DIAGNOSIS — M25612 Stiffness of left shoulder, not elsewhere classified: Secondary | ICD-10-CM

## 2017-11-26 DIAGNOSIS — R29898 Other symptoms and signs involving the musculoskeletal system: Secondary | ICD-10-CM

## 2017-11-26 DIAGNOSIS — R293 Abnormal posture: Secondary | ICD-10-CM
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
## 2017-11-26 NOTE — Discharge Instructions (Signed)
May continue Robaxin, Mobic, and tramadol as needed.  Apply ice pack for 20 to 30 minutes, 3 to 4 times daily  Continue until pain and swelling decrease.  Begin range of motion and stretching exercises as tolerated.
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
## 2017-11-26 NOTE — Therapy (Signed)
[facility] [address]  [place], [place], [address] Phone: [phone]   Fax:  [phone]  Physical Therapy Treatment  Patient Details  Name: Danielle Braun MRN: [id number] Date of Birth: Jan 05, 1961 Referring Provider: Dr. Tania Ade   Encounter Date: 11/26/2017  PT End of Session - 11/26/17 0852    Visit Number  5    Number of Visits  9    Date for PT Re-Evaluation  12/17/17    Authorization Type  WC    Authorization - Visit Number  5    Authorization - Number of Visits  9    PT Start Time  [phone]    PT Stop Time  0945 ice pack at end of session    PT Time Calculation (min)  62 min    Activity Tolerance  Patient tolerated treatment well    Behavior During Therapy  [facility] for tasks assessed/performed       Past Medical History:  Diagnosis Date  . Acne   . Arrhythmia   . Bursitis, subacromial    Left  . Endometriosis   . GERD (gastroesophageal reflux disease)   . Sinusitis     Past Surgical History:  Procedure Laterality Date  . ABDOMINAL HYSTERECTOMY    . ABDOMINAL SURGERY     csection  . DILATION AND CURETTAGE OF UTERUS    . SHOULDER ARTHROSCOPY WITH SUBACROMIAL DECOMPRESSION Left 10/31/2017   Procedure: SHOULDER ARTHROSCOPY WITH BURSECTOMY AND SUBACROMIAL DECOMPRESSION;  Surgeon: Tania Ade, MD;  Location: [facility];  Service: Orthopedics;  Laterality: Left;  Left shoulder arthroscopy bursectomy and subacromial decompression  . TONSILLECTOMY      There were no vitals filed for this visit.  Subjective Assessment - 11/26/17 0847    Subjective  Had pain relief until yesterday    Diagnostic tests  MRI: Supraspinatus and infraspinatus tendinopathy without tear. Prominent subacromial/subdeltoid fluid consistent with bursitis. Mild acromioclavicular osteoarthritis - prior to surgery    Patient Stated Goals  prevent disability and pain, restore function and ROM, eliminate pain    Currently in Pain?   Yes    Pain Score  3     Pain Location  Shoulder    Pain Orientation  Left    Pain Descriptors / Indicators  Aching intermittent sharp/catching    Pain Type  Acute pain;Surgical pain                      [facility] Adult PT Treatment/Exercise - 11/26/17 0852      Shoulder Exercises: ROM/Strengthening   UBE (Upper Arm Bike)  L2 x 6 min (3/3)    Cybex Row  15 reps 15# - narrow grip    Ball on Wall  2# at L wrist - walkign orange pball up wall x 12 reps      Shoulder Exercises: Stretch   Corner Stretch  3 reps;30 seconds mid level only      Modalities   Modalities  Cryotherapy      Cryotherapy   Number Minutes Cryotherapy  10 Minutes    Cryotherapy Location  Shoulder    Type of Cryotherapy  Ice pack      Manual Therapy   Manual Therapy  Soft tissue mobilization    Manual therapy comments  patient supine    Soft tissue mobilization  L pec major/minor, L bicep, L deltoid, L UT, L infrapsinatus, L teres group  Trigger Point Dry Needling - 11/26/17 1156    Consent Given?  Yes    Muscles Treated Upper Body  Pectoralis minor lower deltoid/bicep    Pectoralis Minor Response  Twitch response elicited;Palpable increased muscle length             PT Short Term Goals - 11/14/17 1191      PT SHORT TERM GOAL #1   Title  patient to be independent with initial HEP    Status  On-going      PT SHORT TERM GOAL #2   Title  patient to improve L shoulder AROM equal to that of R shoulder without pain limiting    Status  On-going        PT Long Term Goals - 11/14/17 0929      PT LONG TERM GOAL #1   Title  patient to be independent with advanced HEP    Status  On-going      PT LONG TERM GOAL #2   Title  patient to improve L UE strength to >/= 4+/5 for improved function    Status  On-going      PT LONG TERM GOAL #3   Title  patient to report ability to perform ADLs and light household tasks without pain limiting    Status  On-going      PT LONG TERM GOAL #4    Title  Patient to report pain no greater than 2/10 for greater than 2 weeks    Status  On-going            Plan - 11/26/17 4782    Clinical Impression Statement  Good response to DN at last visit with good pain control/relief until yesterday. Does report continued intermittent sharp pains with sudden/awkward movements of L UE. Wished to proceed with DN today at anterior shoulder at this area feels most tight/bothersome - good twitch response. Will continue to progress towards goals of improved pain and restoring functional mobility at L shoulder.     PT Treatment/Interventions  ADLs/Self Care Home Management;Cryotherapy;Electrical Stimulation;Iontophoresis 4mg /ml Dexamethasone;Moist Heat;Therapeutic activities;Therapeutic exercise;Ultrasound;Neuromuscular re-education;Patient/family education;Manual techniques;Vasopneumatic Device;Taping;Dry needling;Passive range of motion    Consulted and Agree with Plan of Care  Patient       Patient will benefit from skilled therapeutic intervention in order to improve the following deficits and impairments:  Pain, Impaired UE functional use, Decreased strength, Decreased range of motion, Decreased activity tolerance  Visit Diagnosis: Acute pain of left shoulder  Stiffness of left shoulder, not elsewhere classified  Abnormal posture  Muscle weakness (generalized)  Other symptoms and signs involving the musculoskeletal system     Problem List Patient Active Problem List   Diagnosis Date Noted  . ANKLE SPRAIN, LEFT 04/10/2009  . ACUTE MAXILLARY SINUSITIS 11/06/2008  . URI 11/06/2008     Lanney Gins, PT, DPT 11/26/17 11:59 AM   [facility] [address]  [place], [place], [address] Phone: [phone]   Fax:  [phone]  Name: Danielle Braun MRN: [id number] Date of Birth: 1961/04/03

## 2017-11-28 ENCOUNTER — Ambulatory Visit: Payer: PRIVATE HEALTH INSURANCE | Admitting: Physical Therapy

## 2017-11-28 ENCOUNTER — Encounter: Payer: Self-pay | Admitting: Physical Therapy

## 2017-11-28 DIAGNOSIS — M6281 Muscle weakness (generalized): Secondary | ICD-10-CM

## 2017-11-28 DIAGNOSIS — M25612 Stiffness of left shoulder, not elsewhere classified: Secondary | ICD-10-CM

## 2017-11-28 DIAGNOSIS — R293 Abnormal posture: Secondary | ICD-10-CM

## 2017-11-28 DIAGNOSIS — M25512 Pain in left shoulder: Secondary | ICD-10-CM | POA: Diagnosis not present

## 2017-11-28 DIAGNOSIS — R29898 Other symptoms and signs involving the musculoskeletal system: Secondary | ICD-10-CM
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
## 2017-12-02 MED FILL — CYCLOBENZAPRINE 5 MG TABLET: 5 | 15 days supply | Qty: 15 | Fill #0

## 2017-12-02 MED FILL — traMADol HCL 50 MG TABS: 50 | 15 days supply | Qty: 15 | Fill #0

## 2017-12-03 ENCOUNTER — Encounter: Payer: Self-pay | Admitting: Physical Therapy

## 2017-12-03 ENCOUNTER — Ambulatory Visit: Payer: PRIVATE HEALTH INSURANCE | Attending: Orthopedic Surgery | Admitting: Physical Therapy

## 2017-12-03 DIAGNOSIS — M25612 Stiffness of left shoulder, not elsewhere classified: Secondary | ICD-10-CM

## 2017-12-03 DIAGNOSIS — R293 Abnormal posture: Secondary | ICD-10-CM

## 2017-12-03 DIAGNOSIS — R29898 Other symptoms and signs involving the musculoskeletal system: Secondary | ICD-10-CM

## 2017-12-03 DIAGNOSIS — M6281 Muscle weakness (generalized): Secondary | ICD-10-CM

## 2017-12-03 DIAGNOSIS — M25512 Pain in left shoulder: Secondary | ICD-10-CM

## 2017-12-03 MED FILL — MELOXICAM 7.5 MG TABLET: 7.5 | 30 days supply | Qty: 60 | Fill #0

## 2017-12-03 NOTE — Therapy (Signed)
[facility] [address]  [place], [place], [address] Phone: [phone]   Fax:  [phone]  Physical Therapy Treatment  Patient Details  Name: Danielle Braun MRN: [id number] Date of Birth: August 24, 1961 Referring Provider: Dr. Tania Ade   Encounter Date: 12/03/2017  PT End of Session - 12/03/17 1630    Visit Number  7    Number of Visits  9    Date for PT Re-Evaluation  12/17/17    Authorization Type  WC    Authorization - Visit Number  7    Authorization - Number of Visits  9    PT Start Time  1528    PT Stop Time  2993    PT Time Calculation (min)  45 min    Activity Tolerance  Patient tolerated treatment well    Behavior During Therapy  [facility] for tasks assessed/performed       Past Medical History:  Diagnosis Date  . Acne   . Arrhythmia   . Bursitis, subacromial    Left  . Endometriosis   . GERD (gastroesophageal reflux disease)   . Sinusitis     Past Surgical History:  Procedure Laterality Date  . ABDOMINAL HYSTERECTOMY    . ABDOMINAL SURGERY     csection  . DILATION AND CURETTAGE OF UTERUS    . SHOULDER ARTHROSCOPY WITH SUBACROMIAL DECOMPRESSION Left 10/31/2017   Procedure: SHOULDER ARTHROSCOPY WITH BURSECTOMY AND SUBACROMIAL DECOMPRESSION;  Surgeon: Tania Ade, MD;  Location: [place];  Service: Orthopedics;  Laterality: Left;  Left shoulder arthroscopy bursectomy and subacromial decompression  . TONSILLECTOMY      There were no vitals filed for this visit.  Subjective Assessment - 12/03/17 1528    Subjective  Saw MD yesterday - take some rest - don't go into too much pain to avoid inflammation    Diagnostic tests  MRI: Supraspinatus and infraspinatus tendinopathy without tear. Prominent subacromial/subdeltoid fluid consistent with bursitis. Mild acromioclavicular osteoarthritis - prior to surgery    Patient Stated Goals  prevent disability and pain, restore function and ROM, eliminate  pain    Currently in Pain?  Yes    Pain Score  4     Pain Location  Shoulder    Pain Orientation  Left    Pain Descriptors / Indicators  Aching;Dull;Discomfort    Pain Type  Acute pain;Surgical pain                      [facility] Adult PT Treatment/Exercise - 12/03/17 0001      Shoulder Exercises: Standing   Other Standing Exercises  bicep - thumb up x 12; palm up x 12 - red tband    Other Standing Exercises  tricep - red tband x 12      Shoulder Exercises: ROM/Strengthening   UBE (Upper Arm Bike)  L2.5 x 6 min (3/3)    Other ROM/Strengthening Exercises  cabinet reaches: flexion x 10, abduction x 10 - 1#    Other ROM/Strengthening Exercises  BATCA pulldown - 10 # x 10 reps      Manual Therapy   Manual Therapy  Soft tissue mobilization;Myofascial release;Taping    Manual therapy comments  patient prone    Soft tissue mobilization  STM to B rhomboids, B UT, L infrapsinatus and L teres/lats group    Myofascial Release  manual trigger point release to R rhomboids    Kinesiotex  Inhibit Muscle  Kinesiotix   Inhibit Muscle   power strip "X" over R thoracic paraspinals; B scapular education pattern        Trigger Point Dry Needling - 12/03/17 1629    Consent Given?  Yes    Muscles Treated Upper Body  Rhomboids L teres/lats    Rhomboids Response  Twitch response elicited;Palpable increased muscle length R side only             PT Short Term Goals - 11/14/17 3299      PT SHORT TERM GOAL #1   Title  patient to be independent with initial HEP    Status  On-going      PT SHORT TERM GOAL #2   Title  patient to improve L shoulder AROM equal to that of R shoulder without pain limiting    Status  On-going        PT Long Term Goals - 11/14/17 0929      PT LONG TERM GOAL #1   Title  patient to be independent with advanced HEP    Status  On-going      PT LONG TERM GOAL #2   Title  patient to improve L UE strength to >/= 4+/5 for improved function     Status  On-going      PT LONG TERM GOAL #3   Title  patient to report ability to perform ADLs and light household tasks without pain limiting    Status  On-going      PT LONG TERM GOAL #4   Title  Patient to report pain no greater than 2/10 for greater than 2 weeks    Status  On-going            Plan - 12/03/17 1631    Clinical Impression Statement  [name] seen by MD this week - recommendations to not push too much into pain to prevent inflammatory response. Doing well today - notable reduction in "sharp" pains throughout day as compared to before surgery. Patient tolerable to all manual and strengthening activities today with no issue. WC approved for 8 additional visits.     PT Treatment/Interventions  ADLs/Self Care Home Management;Cryotherapy;Electrical Stimulation;Iontophoresis 4mg /ml Dexamethasone;Moist Heat;Therapeutic activities;Therapeutic exercise;Ultrasound;Neuromuscular re-education;Patient/family education;Manual techniques;Vasopneumatic Device;Taping;Dry needling;Passive range of motion    Consulted and Agree with Plan of Care  Patient       Patient will benefit from skilled therapeutic intervention in order to improve the following deficits and impairments:  Pain, Impaired UE functional use, Decreased strength, Decreased range of motion, Decreased activity tolerance  Visit Diagnosis: Acute pain of left shoulder  Stiffness of left shoulder, not elsewhere classified  Abnormal posture  Muscle weakness (generalized)  Other symptoms and signs involving the musculoskeletal system     Problem List Patient Active Problem List   Diagnosis Date Noted  . ANKLE SPRAIN, LEFT 04/10/2009  . ACUTE MAXILLARY SINUSITIS 11/06/2008  . URI 11/06/2008     Lanney Gins, PT, DPT 12/03/17 4:33 PM   [facility] [address]  [place], [place], [address] Phone: [phone]   Fax:  [phone]  Name:  MALISSA SLAY MRN: [id number] Date of Birth: 04-27-1961

## 2017-12-06 ENCOUNTER — Ambulatory Visit: Payer: PRIVATE HEALTH INSURANCE | Admitting: Physical Therapy

## 2017-12-06 ENCOUNTER — Encounter: Payer: Self-pay | Admitting: Physical Therapy

## 2017-12-06 DIAGNOSIS — M25612 Stiffness of left shoulder, not elsewhere classified: Secondary | ICD-10-CM

## 2017-12-06 DIAGNOSIS — M25512 Pain in left shoulder: Secondary | ICD-10-CM | POA: Diagnosis not present

## 2017-12-06 DIAGNOSIS — R293 Abnormal posture: Secondary | ICD-10-CM

## 2017-12-06 DIAGNOSIS — R29898 Other symptoms and signs involving the musculoskeletal system: Secondary | ICD-10-CM

## 2017-12-06 DIAGNOSIS — M6281 Muscle weakness (generalized): Secondary | ICD-10-CM

## 2017-12-06 NOTE — Therapy (Signed)
[facility] [address]  [place], [place], [address] Phone: [phone]   Fax:  [phone]  Physical Therapy Treatment  Patient Details  Name: Danielle Braun MRN: [id number] Date of Birth: August 01, 1961 Referring Provider: Dr. Tania Ade   Encounter Date: 12/06/2017  PT End of Session - 12/06/17 0801    Visit Number  8    Number of Visits  9    Date for PT Re-Evaluation  12/17/17    Authorization Type  WC    Authorization - Visit Number  8    Authorization - Number of Visits  9    PT Start Time  1025    PT Stop Time  8527 ice at end of session    PT Time Calculation (min)  58 min    Activity Tolerance  Patient tolerated treatment well    Behavior During Therapy  [facility] for tasks assessed/performed       Past Medical History:  Diagnosis Date  . Acne   . Arrhythmia   . Bursitis, subacromial    Left  . Endometriosis   . GERD (gastroesophageal reflux disease)   . Sinusitis     Past Surgical History:  Procedure Laterality Date  . ABDOMINAL HYSTERECTOMY    . ABDOMINAL SURGERY     csection  . DILATION AND CURETTAGE OF UTERUS    . SHOULDER ARTHROSCOPY WITH SUBACROMIAL DECOMPRESSION Left 10/31/2017   Procedure: SHOULDER ARTHROSCOPY WITH BURSECTOMY AND SUBACROMIAL DECOMPRESSION;  Surgeon: Tania Ade, MD;  Location: [place] [facility];  Service: Orthopedics;  Laterality: Left;  Left shoulder arthroscopy bursectomy and subacromial decompression  . TONSILLECTOMY      There were no vitals filed for this visit.  Subjective Assessment - 12/06/17 0757    Subjective  spot on R sided back still bothersome; still has sharp pains in L shoulder    Diagnostic tests  MRI: Supraspinatus and infraspinatus tendinopathy without tear. Prominent subacromial/subdeltoid fluid consistent with bursitis. Mild acromioclavicular osteoarthritis - prior to surgery    Patient Stated Goals  prevent disability and pain, restore function and ROM,  eliminate pain    Pain Score  -- up to 6/10 with awkward movements    Pain Location  Shoulder    Pain Orientation  Left    Pain Descriptors / Indicators  Sharp                      [facility] Adult PT Treatment/Exercise - 12/06/17 0801      Shoulder Exercises: Prone   Retraction  Strengthening;Left;15 reps;Weights prone row on EOM    Retraction Weight (lbs)  2    Extension  Strengthening;Left;15 reps;Weights prone "I" on EOM    Extension Weight (lbs)  2    Horizontal ABduction 1  Strengthening;Left;15 reps prone "T"       Shoulder Exercises: Standing   External Rotation  Strengthening;Both;15 reps;Theraband    Theraband Level (Shoulder External Rotation)  Level 2 (Red)    Internal Rotation  Strengthening;Left;15 reps;Theraband    Theraband Level (Shoulder Internal Rotation)  Level 2 (Red)      Shoulder Exercises: ROM/Strengthening   UBE (Upper Arm Bike)  L3 x 6 min (3/3)    Rhythmic Stabilization, Seated  forward flexion - CW/CW x 15; abduction CW/CCW x 15    Other ROM/Strengthening Exercises  BATCA pulldown - 15# x 10 reps      Modalities   Modalities  Cryotherapy;Iontophoresis  Cryotherapy   Number Minutes Cryotherapy  10 Minutes    Cryotherapy Location  Shoulder    Type of Cryotherapy  Ice pack      Iontophoresis   Type of Iontophoresis  Dexamethasone    Location  L anterior shoulder    Dose  1.0 mL    Time  4-6 hours; 80 mA      Manual Therapy   Manual Therapy  Soft tissue mobilization;Myofascial release    Manual therapy comments  patient prone    Soft tissue mobilization  STM to L infra, teres, lats, tricep, deltoid    Myofascial Release  manual trigger piint release to lateral infra and teres group.               PT Short Term Goals - 11/14/17 1540      PT SHORT TERM GOAL #1   Title  patient to be independent with initial HEP    Status  On-going      PT SHORT TERM GOAL #2   Title  patient to improve L shoulder AROM equal to that of  R shoulder without pain limiting    Status  On-going        PT Long Term Goals - 11/14/17 0929      PT LONG TERM GOAL #1   Title  patient to be independent with advanced HEP    Status  On-going      PT LONG TERM GOAL #2   Title  patient to improve L UE strength to >/= 4+/5 for improved function    Status  On-going      PT LONG TERM GOAL #3   Title  patient to report ability to perform ADLs and light household tasks without pain limiting    Status  On-going      PT LONG TERM GOAL #4   Title  Patient to report pain no greater than 2/10 for greater than 2 weeks    Status  On-going            Plan - 12/06/17 0801    Clinical Impression Statement  Danielle Braun still with intermittetn sharp pains with overhead motion both in flexion and abduction - similar to impingement type symptoms. Able to minimize/terminate pain with more shoulder depression with activities. Instructed to perform self STM to posterior shoulder comle for hopeful pain relief. Will continue to progress as tolerated.     PT Treatment/Interventions  ADLs/Self Care Home Management;Cryotherapy;Electrical Stimulation;Iontophoresis 4mg /ml Dexamethasone;Moist Heat;Therapeutic activities;Therapeutic exercise;Ultrasound;Neuromuscular re-education;Patient/family education;Manual techniques;Vasopneumatic Device;Taping;Dry needling;Passive range of motion    Consulted and Agree with Plan of Care  Patient       Patient will benefit from skilled therapeutic intervention in order to improve the following deficits and impairments:  Pain, Impaired UE functional use, Decreased strength, Decreased range of motion, Decreased activity tolerance  Visit Diagnosis: Acute pain of left shoulder  Stiffness of left shoulder, not elsewhere classified  Abnormal posture  Muscle weakness (generalized)  Other symptoms and signs involving the musculoskeletal system     Problem List Patient Active Problem List   Diagnosis Date Noted  .  ANKLE SPRAIN, LEFT 04/10/2009  . ACUTE MAXILLARY SINUSITIS 11/06/2008  . URI 11/06/2008     Lanney Gins, PT, DPT 12/06/17 9:00 AM   [facility] [address]  [place], [place], [address] Phone: [phone]   Fax:  [phone]  Name: Danielle Braun MRN: [id number] Date of Birth:  12/25/1960   

## 2017-12-10 ENCOUNTER — Encounter: Payer: Self-pay | Admitting: Physical Therapy

## 2017-12-10 ENCOUNTER — Ambulatory Visit: Payer: PRIVATE HEALTH INSURANCE | Admitting: Physical Therapy

## 2017-12-10 DIAGNOSIS — R29898 Other symptoms and signs involving the musculoskeletal system: Secondary | ICD-10-CM

## 2017-12-10 DIAGNOSIS — M25612 Stiffness of left shoulder, not elsewhere classified: Secondary | ICD-10-CM

## 2017-12-10 DIAGNOSIS — M25512 Pain in left shoulder: Secondary | ICD-10-CM | POA: Diagnosis not present

## 2017-12-10 DIAGNOSIS — M6281 Muscle weakness (generalized): Secondary | ICD-10-CM

## 2017-12-10 DIAGNOSIS — R293 Abnormal posture: Secondary | ICD-10-CM

## 2017-12-10 NOTE — Patient Instructions (Signed)
Resistance: 90/90 Internal Rotation (Slow / Fast)   Face away from anchor in shoulder width stance. Bend left elbow to 90, forearm up, arm out to side. Palm forward, pull forearm down slow until hand is level with elbow. Repeat _15__ times.   Resistance: 90/90 External Rotation (Slow / Fast)   Face anchor in shoulder width stance. Bend right elbow to 90, forearm forward, arm out to side. Palm down, pull forearm up slow to vertical. Repeat _15__ times.

## 2017-12-10 NOTE — Therapy (Signed)
[facility] [address]  [place], [place], [address] Phone: [phone]   Fax:  [phone]  Physical Therapy Treatment  Patient Details  Name: Danielle Braun MRN: [id number] Date of Birth: [birth year] Referring Provider: Dr. Tania Ade   Encounter Date: 12/10/2017  PT End of Session - 12/10/17 0802    Visit Number  9    Number of Visits  17    Date for PT Re-Evaluation  01/14/18    Authorization Type  WC    Authorization - Visit Number  9    Authorization - Number of Visits  17    PT Start Time  2595    PT Stop Time  0854 ice pack at end of session    PT Time Calculation (min)  56 min    Activity Tolerance  Patient tolerated treatment well    Behavior During Therapy  [facility] for tasks assessed/performed       Past Medical History:  Diagnosis Date  . Acne   . Arrhythmia   . Bursitis, subacromial    Left  . Endometriosis   . GERD (gastroesophageal reflux disease)   . Sinusitis     Past Surgical History:  Procedure Laterality Date  . ABDOMINAL HYSTERECTOMY    . ABDOMINAL SURGERY     csection  . DILATION AND CURETTAGE OF UTERUS    . SHOULDER ARTHROSCOPY WITH SUBACROMIAL DECOMPRESSION Left 10/31/2017   Procedure: SHOULDER ARTHROSCOPY WITH BURSECTOMY AND SUBACROMIAL DECOMPRESSION;  Surgeon: Tania Ade, MD;  Location: [place];  Service: Orthopedics;  Laterality: Left;  Left shoulder arthroscopy bursectomy and subacromial decompression  . TONSILLECTOMY      There were no vitals filed for this visit.  Subjective Assessment - 12/10/17 0800    Subjective  some soreness into bicep; still with intermittent sharp pain in shoulder    Diagnostic tests  MRI: Supraspinatus and infraspinatus tendinopathy without tear. Prominent subacromial/subdeltoid fluid consistent with bursitis. Mild acromioclavicular osteoarthritis - prior to surgery    Patient Stated Goals  prevent disability and pain, restore function and  ROM, eliminate pain    Currently in Pain?  Yes    Pain Score  -- up to 6/10 with awkward movements    Pain Location  Shoulder    Pain Orientation  Left    Pain Descriptors / Indicators  [facility] Adult PT Treatment/Exercise - 12/10/17 0804      Shoulder Exercises: Sidelying   External Rotation  Strengthening;Left;15 reps;Weights    External Rotation Weight (lbs)  3      Shoulder Exercises: Standing   External Rotation  Strengthening;Left;15 reps;Theraband 90/90    Theraband Level (Shoulder External Rotation)  Level 1 (Yellow)    Internal Rotation  Strengthening;Left;15 reps;Theraband 90/90    Theraband Level (Shoulder Internal Rotation)  Level 1 (Yellow)    Extension  Strengthening;Both;15 reps;Theraband    Theraband Level (Shoulder Extension)  Level 2 (Red)    Row  Strengthening;Both;15 reps;Theraband    Theraband Level (Shoulder Row)  Level 2 (Red)    Other Standing Exercises  PNF D1/D2 flexion/extension - yellow tband - L UE x 15 reps each      Shoulder Exercises: ROM/Strengthening   UBE (Upper Arm Bike)  L2 x 6 min (3/3)    Wall Pushups  15 reps 2nd set on orange pball  Rhythmic Stabilization, Supine  L UE - red medball - CW x 15, CCW x 15, A-Z    Other ROM/Strengthening Exercises  supine - L serratus punch - 3# - 2 x 15    Other ROM/Strengthening Exercises  BATCA pulldown - 10# x 12 reps      Modalities   Modalities  Cryotherapy;Iontophoresis      Cryotherapy   Number Minutes Cryotherapy  10 Minutes    Cryotherapy Location  Shoulder    Type of Cryotherapy  Ice pack      Iontophoresis   Type of Iontophoresis  Dexamethasone    Location  L anterior shoulder    Dose  1.0 mL    Time  4-6 hours; 80 mA               PT Short Term Goals - 11/14/17 0960      PT SHORT TERM GOAL #1   Title  patient to be independent with initial HEP    Status  On-going      PT SHORT TERM GOAL #2   Title  patient to improve L shoulder  AROM equal to that of R shoulder without pain limiting    Status  On-going        PT Long Term Goals - 11/14/17 0929      PT LONG TERM GOAL #1   Title  patient to be independent with advanced HEP    Status  On-going      PT LONG TERM GOAL #2   Title  patient to improve L UE strength to >/= 4+/5 for improved function    Status  On-going      PT LONG TERM GOAL #3   Title  patient to report ability to perform ADLs and light household tasks without pain limiting    Status  On-going      PT LONG TERM GOAL #4   Title  Patient to report pain no greater than 2/10 for greater than 2 weeks    Status  On-going            Plan - 12/10/17 0803    Clinical Impression Statement  [name] doing well today - reports improvement of L shoulder pain/function since starting PT, but does continue to have intermittent sharp pains and limitations. Patient tolerable to all strengthening today with very few "sharp cathces" noted during session. Patient with full PROM and near full AROM - likely has full AROM, however anxiety/fear of pain does limit patient. Continued discussion on relative healing times and return to feeling "normal" with good understanding. Will continue to progress toward goals and functional use of L UE.     PT Treatment/Interventions  ADLs/Self Care Home Management;Cryotherapy;Electrical Stimulation;Iontophoresis 4mg /ml Dexamethasone;Moist Heat;Therapeutic activities;Therapeutic exercise;Ultrasound;Neuromuscular re-education;Patient/family education;Manual techniques;Vasopneumatic Device;Taping;Dry needling;Passive range of motion    Consulted and Agree with Plan of Care  Patient       Patient will benefit from skilled therapeutic intervention in order to improve the following deficits and impairments:  Pain, Impaired UE functional use, Decreased strength, Decreased range of motion, Decreased activity tolerance  Visit Diagnosis: Acute pain of left shoulder - Plan: PT plan of care  cert/re-cert  Stiffness of left shoulder, not elsewhere classified - Plan: PT plan of care cert/re-cert  Abnormal posture - Plan: PT plan of care cert/re-cert  Muscle weakness (generalized) - Plan: PT plan of care cert/re-cert  Other symptoms and signs involving the musculoskeletal system - Plan: PT plan of care cert/re-cert  Problem List Patient Active Problem List   Diagnosis Date Noted  . ANKLE SPRAIN, LEFT 04/10/2009  . ACUTE MAXILLARY SINUSITIS 11/06/2008  . URI 11/06/2008     Lanney Gins, PT, DPT 12/10/17 9:55 AM   [facility] [address]  [place], [place], [address] Phone: [phone]   Fax:  [phone]  Name: Danielle Braun MRN: [id number] Date of Birth: Oct 04, 1961

## 2017-12-12 ENCOUNTER — Ambulatory Visit: Payer: PRIVATE HEALTH INSURANCE | Admitting: Physical Therapy

## 2017-12-12 ENCOUNTER — Encounter: Payer: Self-pay | Admitting: Physical Therapy

## 2017-12-12 DIAGNOSIS — R29898 Other symptoms and signs involving the musculoskeletal system: Secondary | ICD-10-CM

## 2017-12-12 DIAGNOSIS — R293 Abnormal posture: Secondary | ICD-10-CM

## 2017-12-12 DIAGNOSIS — M25512 Pain in left shoulder: Secondary | ICD-10-CM | POA: Diagnosis not present

## 2017-12-12 DIAGNOSIS — M25612 Stiffness of left shoulder, not elsewhere classified: Secondary | ICD-10-CM

## 2017-12-12 DIAGNOSIS — M6281 Muscle weakness (generalized): Secondary | ICD-10-CM

## 2017-12-12 NOTE — Therapy (Signed)
[facility] [address]  [place], [place], [address] Phone: [phone]   Fax:  [phone]  Physical Therapy Treatment  Patient Details  Name: Danielle Braun MRN: [id number] Date of Birth: 01-23-1961 Referring Provider: Dr. Tania Ade   Encounter Date: 12/12/2017  PT End of Session - 12/12/17 1530    Visit Number  10    Number of Visits  17    Date for PT Re-Evaluation  01/14/18    Authorization Type  WC    Authorization - Visit Number  10    Authorization - Number of Visits  17    PT Start Time  1528    PT Stop Time  1611    PT Time Calculation (min)  43 min    Activity Tolerance  Patient tolerated treatment well    Behavior During Therapy  [facility] for tasks assessed/performed       Past Medical History:  Diagnosis Date  . Acne   . Arrhythmia   . Bursitis, subacromial    Left  . Endometriosis   . GERD (gastroesophageal reflux disease)   . Sinusitis     Past Surgical History:  Procedure Laterality Date  . ABDOMINAL HYSTERECTOMY    . ABDOMINAL SURGERY     csection  . DILATION AND CURETTAGE OF UTERUS    . SHOULDER ARTHROSCOPY WITH SUBACROMIAL DECOMPRESSION Left 10/31/2017   Procedure: SHOULDER ARTHROSCOPY WITH BURSECTOMY AND SUBACROMIAL DECOMPRESSION;  Surgeon: Tania Ade, MD;  Location: [place];  Service: Orthopedics;  Laterality: Left;  Left shoulder arthroscopy bursectomy and subacromial decompression  . TONSILLECTOMY      There were no vitals filed for this visit.  Subjective Assessment - 12/12/17 1529    Subjective  just having some upper bicep soreness - no known reason    Diagnostic tests  MRI: Supraspinatus and infraspinatus tendinopathy without tear. Prominent subacromial/subdeltoid fluid consistent with bursitis. Mild acromioclavicular osteoarthritis - prior to surgery    Patient Stated Goals  prevent disability and pain, restore function and ROM, eliminate pain                       [facility] Adult PT Treatment/Exercise - 12/12/17 1531      Shoulder Exercises: Prone   Retraction  Strengthening;Both;15 reps;Weights prone "I" over green pball    Retraction Weight (lbs)  1    Extension  Strengthening;Both;15 reps;Weights prone "T" over green pball    Extension Weight (lbs)  1    Horizontal ABduction 1  Strengthening;Both;15 reps;Weights prone "Y" over green pball    Horizontal ABduction 1 Weight (lbs)  1      Shoulder Exercises: Standing   Other Standing Exercises  PNF D1/D2 flexion/extension - yellow tband - L UE x 15 reps each      Shoulder Exercises: ROM/Strengthening   UBE (Upper Arm Bike)  L2.5 x 6 min (3/3)    Other ROM/Strengthening Exercises  serratus rolls - red tband at wrist x 12reps    Other ROM/Strengthening Exercises  TRX - row x 15      Shoulder Exercises: Body Blade   Flexion  15 seconds;3 reps B UE    ABduction  15 seconds;3 reps L UE    External Rotation  15 seconds;3 reps L UE      Manual Therapy   Manual Therapy  Taping    Kinesiotex  Inhibit Muscle      Kinesiotix   Inhibit  Muscle   "I" strip at bicep and tricep               PT Short Term Goals - 11/14/17 2707      PT SHORT TERM GOAL #1   Title  patient to be independent with initial HEP    Status  On-going      PT SHORT TERM GOAL #2   Title  patient to improve L shoulder AROM equal to that of R shoulder without pain limiting    Status  On-going        PT Long Term Goals - 11/14/17 0929      PT LONG TERM GOAL #1   Title  patient to be independent with advanced HEP    Status  On-going      PT LONG TERM GOAL #2   Title  patient to improve L UE strength to >/= 4+/5 for improved function    Status  On-going      PT LONG TERM GOAL #3   Title  patient to report ability to perform ADLs and light household tasks without pain limiting    Status  On-going      PT LONG TERM GOAL #4   Title  Patient to report pain no greater than 2/10 for  greater than 2 weeks    Status  On-going            Plan - 12/12/17 1530    Clinical Impression Statement  [name] reporting some bicep/tricep region soreness today - but feels as if it is more referred pain rather than musculature. Patient tolerable to all strengthening, but does require consistent heavy VC/TC for shoulder depression and reduced guarding to reduce pain onset with fair/good carryover. Taping applied to anterior posterior shoulder for hopeful pain relief. Will continue to progress towards goals as able.     PT Treatment/Interventions  ADLs/Self Care Home Management;Cryotherapy;Electrical Stimulation;Iontophoresis 4mg /ml Dexamethasone;Moist Heat;Therapeutic activities;Therapeutic exercise;Ultrasound;Neuromuscular re-education;Patient/family education;Manual techniques;Vasopneumatic Device;Taping;Dry needling;Passive range of motion    Consulted and Agree with Plan of Care  Patient       Patient will benefit from skilled therapeutic intervention in order to improve the following deficits and impairments:  Pain, Impaired UE functional use, Decreased strength, Decreased range of motion, Decreased activity tolerance  Visit Diagnosis: Acute pain of left shoulder  Stiffness of left shoulder, not elsewhere classified  Abnormal posture  Muscle weakness (generalized)  Other symptoms and signs involving the musculoskeletal system     Problem List Patient Active Problem List   Diagnosis Date Noted  . ANKLE SPRAIN, LEFT 04/10/2009  . ACUTE MAXILLARY SINUSITIS 11/06/2008  . URI 11/06/2008     Lanney Gins, PT, DPT 12/12/17 5:59 PM   [facility] [address]  [place], [place], [address] Phone: [phone]   Fax:  [phone]  Name: Danielle Braun MRN: [id number] Date of Birth: 06-22-1961

## 2017-12-16 ENCOUNTER — Ambulatory Visit: Payer: PRIVATE HEALTH INSURANCE | Admitting: Physical Therapy

## 2017-12-16 ENCOUNTER — Encounter: Payer: Self-pay | Admitting: Physical Therapy

## 2017-12-16 DIAGNOSIS — M25512 Pain in left shoulder: Secondary | ICD-10-CM

## 2017-12-16 DIAGNOSIS — M25612 Stiffness of left shoulder, not elsewhere classified: Secondary | ICD-10-CM

## 2017-12-16 DIAGNOSIS — M6281 Muscle weakness (generalized): Secondary | ICD-10-CM

## 2017-12-16 DIAGNOSIS — R293 Abnormal posture: Secondary | ICD-10-CM

## 2017-12-16 DIAGNOSIS — R29898 Other symptoms and signs involving the musculoskeletal system: Secondary | ICD-10-CM

## 2017-12-16 NOTE — Therapy (Signed)
[facility] [address]  [place], [place], [address] Phone: [phone]   Fax:  [phone]  Physical Therapy Treatment  Patient Details  Name: Danielle Braun MRN: [id number] Date of Birth: 05-16-1961 Referring Provider: Dr. Tania Ade   Encounter Date: 12/16/2017  PT End of Session - 12/16/17 0847    Visit Number  11    Number of Visits  17    Date for PT Re-Evaluation  01/14/18    Authorization Type  WC    Authorization - Visit Number  11    Authorization - Number of Visits  17    PT Start Time  0539    PT Stop Time  0939    PT Time Calculation (min)  58 min    Activity Tolerance  Patient tolerated treatment well    Behavior During Therapy  [facility] for tasks assessed/performed       Past Medical History:  Diagnosis Date  . Acne   . Arrhythmia   . Bursitis, subacromial    Left  . Endometriosis   . GERD (gastroesophageal reflux disease)   . Sinusitis     Past Surgical History:  Procedure Laterality Date  . ABDOMINAL HYSTERECTOMY    . ABDOMINAL SURGERY     csection  . DILATION AND CURETTAGE OF UTERUS    . SHOULDER ARTHROSCOPY WITH SUBACROMIAL DECOMPRESSION Left 10/31/2017   Procedure: SHOULDER ARTHROSCOPY WITH BURSECTOMY AND SUBACROMIAL DECOMPRESSION;  Surgeon: Tania Ade, MD;  Location: [place];  Service: Orthopedics;  Laterality: Left;  Left shoulder arthroscopy bursectomy and subacromial decompression  . TONSILLECTOMY      There were no vitals filed for this visit.  Subjective Assessment - 12/16/17 0843    Subjective  doing well - shoulder has been about the same    Diagnostic tests  MRI: Supraspinatus and infraspinatus tendinopathy without tear. Prominent subacromial/subdeltoid fluid consistent with bursitis. Mild acromioclavicular osteoarthritis - prior to surgery    Patient Stated Goals  prevent disability and pain, restore function and ROM, eliminate pain    Currently in Pain?   No/denies    Pain Score  0-No pain                      [facility] Adult PT Treatment/Exercise - 12/16/17 0850      Shoulder Exercises: Standing   Other Standing Exercises  upright row - 3# B UE x 15    Other Standing Exercises  thumb up bicep curl - red tband x 15      Shoulder Exercises: ROM/Strengthening   UBE (Upper Arm Bike)  L2.5 x 6 min (3/3)    Wall Pushups  15 reps orange pball at wall    X to V Arms  yellow tband x 15 reps    Rhythmic Stabilization, Seated  forward flexion - CW/CW x 15; abduction CW/CCW x 15 - red medball at wall    Other ROM/Strengthening Exercises  modified chest press - blue tband x 15 reps    Other ROM/Strengthening Exercises  BATCA pulldown - 15# x 15 reps      Shoulder Exercises: Stretch   Other Shoulder Stretches  wrist flexion/extension stretch - 3 x 30 sec each    Other Shoulder Stretches  L bicep stretch 2 x 30 sec      Modalities   Modalities  Cryotherapy      Cryotherapy   Number Minutes Cryotherapy  10 Minutes  Cryotherapy Location  Shoulder    Type of Cryotherapy  Ice pack               PT Short Term Goals - 11/14/17 5035      PT SHORT TERM GOAL #1   Title  patient to be independent with initial HEP    Status  On-going      PT SHORT TERM GOAL #2   Title  patient to improve L shoulder AROM equal to that of R shoulder without pain limiting    Status  On-going        PT Long Term Goals - 11/14/17 0929      PT LONG TERM GOAL #1   Title  patient to be independent with advanced HEP    Status  On-going      PT LONG TERM GOAL #2   Title  patient to improve L UE strength to >/= 4+/5 for improved function    Status  On-going      PT LONG TERM GOAL #3   Title  patient to report ability to perform ADLs and light household tasks without pain limiting    Status  On-going      PT LONG TERM GOAL #4   Title  Patient to report pain no greater than 2/10 for greater than 2 weeks    Status  On-going             Plan - 12/16/17 0848    Clinical Impression Statement  [name] doing well today. Doing well with all strengthening. Plan for more overhead work at upcoming visits as this is the area of greatest deficit + pain provocation. Noted slight UT compensations + hiking at L shoulder with overhead motions that may be contributing to pain.     PT Treatment/Interventions  ADLs/Self Care Home Management;Cryotherapy;Electrical Stimulation;Iontophoresis 4mg /ml Dexamethasone;Moist Heat;Therapeutic activities;Therapeutic exercise;Ultrasound;Neuromuscular re-education;Patient/family education;Manual techniques;Vasopneumatic Device;Taping;Dry needling;Passive range of motion    Consulted and Agree with Plan of Care  Patient       Patient will benefit from skilled therapeutic intervention in order to improve the following deficits and impairments:  Pain, Impaired UE functional use, Decreased strength, Decreased range of motion, Decreased activity tolerance  Visit Diagnosis: Acute pain of left shoulder  Stiffness of left shoulder, not elsewhere classified  Abnormal posture  Muscle weakness (generalized)  Other symptoms and signs involving the musculoskeletal system     Problem List Patient Active Problem List   Diagnosis Date Noted  . ANKLE SPRAIN, LEFT 04/10/2009  . ACUTE MAXILLARY SINUSITIS 11/06/2008  . URI 11/06/2008     Lanney Gins, PT, DPT 12/16/17 10:30 AM   [facility] [address]  [place], [place], [address] Phone: [phone]   Fax:  [phone]  Name: Danielle Braun MRN: [id number] Date of Birth: October 23, 1961

## 2017-12-17 MED FILL — ESOMEPRAZOLE MAG DR 20 MG C: 20 | 90 days supply | Qty: 90 | Fill #1

## 2017-12-19 ENCOUNTER — Encounter: Payer: Self-pay | Admitting: Physical Therapy

## 2017-12-19 ENCOUNTER — Ambulatory Visit: Payer: PRIVATE HEALTH INSURANCE | Admitting: Physical Therapy

## 2017-12-19 DIAGNOSIS — M25512 Pain in left shoulder: Secondary | ICD-10-CM

## 2017-12-19 DIAGNOSIS — R293 Abnormal posture: Secondary | ICD-10-CM

## 2017-12-19 DIAGNOSIS — M6281 Muscle weakness (generalized): Secondary | ICD-10-CM

## 2017-12-19 DIAGNOSIS — M25612 Stiffness of left shoulder, not elsewhere classified: Secondary | ICD-10-CM

## 2017-12-19 DIAGNOSIS — R29898 Other symptoms and signs involving the musculoskeletal system: Secondary | ICD-10-CM

## 2017-12-19 NOTE — Therapy (Signed)
[facility] [address]  [place], [place], [address] Phone: [phone]   Fax:  [phone]  Physical Therapy Treatment  Patient Details  Name: Danielle Braun MRN: [id number] Date of Birth: [birth year] Referring Provider: Dr. Tania Ade   Encounter Date: 12/19/2017  PT End of Session - 12/19/17 0846    Visit Number  12    Number of Visits  17    Date for PT Re-Evaluation  01/14/18    Authorization Type  WC    Authorization - Visit Number  12    Authorization - Number of Visits  17    PT Start Time  [id number]    PT Stop Time  0939 ice at end of session    PT Time Calculation (min)  57 min    Activity Tolerance  Patient tolerated treatment well    Behavior During Therapy  [facility] for tasks assessed/performed       Past Medical History:  Diagnosis Date  . Acne   . Arrhythmia   . Bursitis, subacromial    Left  . Endometriosis   . GERD (gastroesophageal reflux disease)   . Sinusitis     Past Surgical History:  Procedure Laterality Date  . ABDOMINAL HYSTERECTOMY    . ABDOMINAL SURGERY     csection  . DILATION AND CURETTAGE OF UTERUS    . SHOULDER ARTHROSCOPY WITH SUBACROMIAL DECOMPRESSION Left 10/31/2017   Procedure: SHOULDER ARTHROSCOPY WITH BURSECTOMY AND SUBACROMIAL DECOMPRESSION;  Surgeon: Tania Ade, MD;  Location: [place];  Service: Orthopedics;  Laterality: Left;  Left shoulder arthroscopy bursectomy and subacromial decompression  . TONSILLECTOMY      There were no vitals filed for this visit.  Subjective Assessment - 12/19/17 0845    Subjective  no pain in antecubital region lately    Diagnostic tests  MRI: Supraspinatus and infraspinatus tendinopathy without tear. Prominent subacromial/subdeltoid fluid consistent with bursitis. Mild acromioclavicular osteoarthritis - prior to surgery    Patient Stated Goals  prevent disability and pain, restore function and ROM, eliminate pain    Currently in  Pain?  No/denies    Pain Score  -- intermittent sharp pains                      [facility] Adult PT Treatment/Exercise - 12/19/17 0847      Shoulder Exercises: Standing   External Rotation  Strengthening;Left;15 reps;Theraband 90/90    Theraband Level (Shoulder External Rotation)  Level 1 (Yellow)    Internal Rotation  Strengthening;Left;15 reps;Theraband 90/90    Theraband Level (Shoulder Internal Rotation)  Level 1 (Yellow)      Shoulder Exercises: ROM/Strengthening   UBE (Upper Arm Bike)  L2.5 x 6 min (3/3)    Cybex Row  15 reps 15# - narrow grip      Modalities   Modalities  Cryotherapy;Ultrasound      Cryotherapy   Number Minutes Cryotherapy  10 Minutes    Cryotherapy Location  Shoulder    Type of Cryotherapy  Ice pack      Ultrasound   Ultrasound Location  L shoulder    Ultrasound Parameters  1 MHz, 1.5 w/cm2, 20% duty cycle    Ultrasound Goals  Pain      Manual Therapy   Manual Therapy  Joint mobilization;Soft tissue mobilization;Taping;Passive ROM    Joint Mobilization  Grade III AP and inferior joint mobs to L GH joint - some "rubbing" noted during AP;  long axis distraction L UE     Soft tissue mobilization  STM to L proximal shoulder    Passive ROM  PROM L GH all planes - no restrictions    Kinesiotex  Inhibit Muscle      Kinesiotix   Inhibit Muscle   "I" strip at bicep               PT Short Term Goals - 12/19/17 0935      PT SHORT TERM GOAL #1   Title  patient to be independent with initial HEP    Status  Achieved      PT SHORT TERM GOAL #2   Title  patient to improve L shoulder AROM equal to that of R shoulder without pain limiting    Status  Achieved        PT Long Term Goals - 12/19/17 0936      PT LONG TERM GOAL #1   Title  patient to be independent with advanced HEP    Status  On-going      PT LONG TERM GOAL #2   Title  patient to improve L UE strength to >/= 4+/5 for improved function    Status  On-going      PT LONG  TERM GOAL #3   Title  patient to report ability to perform ADLs and light household tasks without pain limiting    Status  On-going      PT LONG TERM GOAL #4   Title  Patient to report pain no greater than 2/10 for greater than 2 weeks    Status  On-going            Plan - 12/19/17 0846    Clinical Impression Statement  Cindy making slow, but consistent progress. Reduction in frequency, intensity, duration of pain since beginning PT, but does continue to report intermittent sharp pains at L shoulder with unknown triggers. Patient doing well with strengthening with both PT and patient noting improvement in form and toelrance to resistance with no issue. WIll continue to progress towards goals as able.     PT Treatment/Interventions  ADLs/Self Care Home Management;Cryotherapy;Electrical Stimulation;Iontophoresis 4mg /ml Dexamethasone;Moist Heat;Therapeutic activities;Therapeutic exercise;Ultrasound;Neuromuscular re-education;Patient/family education;Manual techniques;Vasopneumatic Device;Taping;Dry needling;Passive range of motion    Consulted and Agree with Plan of Care  Patient       Patient will benefit from skilled therapeutic intervention in order to improve the following deficits and impairments:  Pain, Impaired UE functional use, Decreased strength, Decreased range of motion, Decreased activity tolerance  Visit Diagnosis: Acute pain of left shoulder  Stiffness of left shoulder, not elsewhere classified  Abnormal posture  Muscle weakness (generalized)  Other symptoms and signs involving the musculoskeletal system     Problem List Patient Active Problem List   Diagnosis Date Noted  . ANKLE SPRAIN, LEFT 04/10/2009  . ACUTE MAXILLARY SINUSITIS 11/06/2008  . URI 11/06/2008     Lanney Gins, PT, DPT 12/19/17 9:42 AM   [facility] [address]  [place], [place], [address] Phone: [phone]   Fax:   [phone]  Name: Danielle Braun MRN: [id number] Date of Birth: [birth year]

## 2017-12-23 ENCOUNTER — Encounter: Payer: Self-pay | Admitting: Physical Therapy

## 2017-12-23 ENCOUNTER — Ambulatory Visit: Payer: PRIVATE HEALTH INSURANCE | Admitting: Physical Therapy

## 2017-12-23 DIAGNOSIS — M25512 Pain in left shoulder: Secondary | ICD-10-CM

## 2017-12-23 DIAGNOSIS — M25612 Stiffness of left shoulder, not elsewhere classified: Secondary | ICD-10-CM

## 2017-12-23 DIAGNOSIS — R29898 Other symptoms and signs involving the musculoskeletal system: Secondary | ICD-10-CM

## 2017-12-23 DIAGNOSIS — R293 Abnormal posture: Secondary | ICD-10-CM

## 2017-12-23 DIAGNOSIS — M6281 Muscle weakness (generalized): Secondary | ICD-10-CM

## 2017-12-23 NOTE — Therapy (Signed)
[facility] [address]  [place], [place], [address] Phone: [phone]   Fax:  [phone]  Physical Therapy Treatment  Patient Details  Name: Danielle Braun MRN: [id number] Date of Birth: [birth year] Referring Provider: Dr. Tania Ade   Encounter Date: 12/23/2017  PT End of Session - 12/23/17 0859    Visit Number  13    Number of Visits  17    Date for PT Re-Evaluation  01/14/18    Authorization Type  WC    Authorization - Visit Number  13    Authorization - Number of Visits  17    PT Start Time  8182    PT Stop Time  0939    PT Time Calculation (min)  45 min    Activity Tolerance  Patient tolerated treatment well    Behavior During Therapy  [facility] for tasks assessed/performed       Past Medical History:  Diagnosis Date  . Acne   . Arrhythmia   . Bursitis, subacromial    Left  . Endometriosis   . GERD (gastroesophageal reflux disease)   . Sinusitis     Past Surgical History:  Procedure Laterality Date  . ABDOMINAL HYSTERECTOMY    . ABDOMINAL SURGERY     csection  . DILATION AND CURETTAGE OF UTERUS    . SHOULDER ARTHROSCOPY WITH SUBACROMIAL DECOMPRESSION Left 10/31/2017   Procedure: SHOULDER ARTHROSCOPY WITH BURSECTOMY AND SUBACROMIAL DECOMPRESSION;  Surgeon: Tania Ade, MD;  Location: [place];  Service: Orthopedics;  Laterality: Left;  Left shoulder arthroscopy bursectomy and subacromial decompression  . TONSILLECTOMY      There were no vitals filed for this visit.  Subjective Assessment - 12/23/17 0856    Subjective  good days and bad days - Sunday had the least trouble washing hair that shes ever had    Diagnostic tests  MRI: Supraspinatus and infraspinatus tendinopathy without tear. Prominent subacromial/subdeltoid fluid consistent with bursitis. Mild acromioclavicular osteoarthritis - prior to surgery    Patient Stated Goals  prevent disability and pain, restore function and ROM,  eliminate pain    Pain Score  -- intermittent sharp pains    Pain Location  Shoulder    Pain Orientation  Left    Pain Descriptors / Indicators  Sharp    Pain Type  Acute pain;Surgical pain                      [facility] Adult PT Treatment/Exercise - 12/23/17 0001      Shoulder Exercises: Prone   External Rotation  Strengthening;Left;20 reps;Weights    External Rotation Weight (lbs)  1      Shoulder Exercises: Standing   Other Standing Exercises  PNF D2 flexion/extension - yellow tband x 15 each - no pain      Shoulder Exercises: ROM/Strengthening   UBE (Upper Arm Bike)  L2.5 x 6 min (3/3)    Ball on Wall  ball tap + active shoulder flexion/extension x 15 reps      Shoulder Exercises: Stretch   Other Shoulder Stretches  L posterior capsule stretch 2 x 30 seconds    Other Shoulder Stretches  L bicep stretch 2 x 30 sec      Manual Therapy   Manual Therapy  Soft tissue mobilization;Myofascial release;Taping    Soft tissue mobilization  STM to L shoulder ER group (teres, lats), bicep and tricep    Myofascial Release  manual trigger point  release to L shoulder ER group    Kinesiotex  Inhibit Muscle      Kinesiotix   Inhibit Muscle   "I" strip at bicep and tricep               PT Short Term Goals - 12/19/17 0935      PT SHORT TERM GOAL #1   Title  patient to be independent with initial HEP    Status  Achieved      PT SHORT TERM GOAL #2   Title  patient to improve L shoulder AROM equal to that of R shoulder without pain limiting    Status  Achieved        PT Long Term Goals - 12/19/17 0936      PT LONG TERM GOAL #1   Title  patient to be independent with advanced HEP    Status  On-going      PT LONG TERM GOAL #2   Title  patient to improve L UE strength to >/= 4+/5 for improved function    Status  On-going      PT LONG TERM GOAL #3   Title  patient to report ability to perform ADLs and light household tasks without pain limiting    Status   On-going      PT LONG TERM GOAL #4   Title  Patient to report pain no greater than 2/10 for greater than 2 weeks    Status  On-going            Plan - 12/23/17 0900    Clinical Impression Statement  Continued intermittent sharp pains noted - reports greater ease with washing hair and reaching into cabinet demonstrating improved functional mobility at L shoulder. Doing well with ER against gravity as well as overhead motions. No compensation noted with abduction, however, some hiking with forward flexion - both with full motion. Will continue to progress as patient tolerates.     PT Treatment/Interventions  ADLs/Self Care Home Management;Cryotherapy;Electrical Stimulation;Iontophoresis 4mg /ml Dexamethasone;Moist Heat;Therapeutic activities;Therapeutic exercise;Ultrasound;Neuromuscular re-education;Patient/family education;Manual techniques;Vasopneumatic Device;Taping;Dry needling;Passive range of motion    Consulted and Agree with Plan of Care  Patient       Patient will benefit from skilled therapeutic intervention in order to improve the following deficits and impairments:  Pain, Impaired UE functional use, Decreased strength, Decreased range of motion, Decreased activity tolerance  Visit Diagnosis: Acute pain of left shoulder  Stiffness of left shoulder, not elsewhere classified  Abnormal posture  Muscle weakness (generalized)  Other symptoms and signs involving the musculoskeletal system     Problem List Patient Active Problem List   Diagnosis Date Noted  . ANKLE SPRAIN, LEFT 04/10/2009  . ACUTE MAXILLARY SINUSITIS 11/06/2008  . URI 11/06/2008     Lanney Gins, PT, DPT 12/23/17 10:44 AM   [facility] [address]  [place], [place], [address] Phone: [phone]   Fax:  [phone]  Name: Danielle Braun MRN: [id number] Date of Birth: 05/15/1961

## 2017-12-26 ENCOUNTER — Encounter: Payer: Self-pay | Admitting: Physical Therapy

## 2017-12-26 ENCOUNTER — Ambulatory Visit: Payer: PRIVATE HEALTH INSURANCE | Admitting: Physical Therapy

## 2017-12-26 DIAGNOSIS — M25612 Stiffness of left shoulder, not elsewhere classified: Secondary | ICD-10-CM

## 2017-12-26 DIAGNOSIS — M25512 Pain in left shoulder: Secondary | ICD-10-CM | POA: Diagnosis not present

## 2017-12-26 DIAGNOSIS — R293 Abnormal posture: Secondary | ICD-10-CM

## 2017-12-26 DIAGNOSIS — R29898 Other symptoms and signs involving the musculoskeletal system: Secondary | ICD-10-CM

## 2017-12-26 DIAGNOSIS — M6281 Muscle weakness (generalized): Secondary | ICD-10-CM

## 2017-12-26 NOTE — Therapy (Signed)
[facility] [address]  [place], [place], [address] Phone: [phone]   Fax:  [phone]  Physical Therapy Treatment  Patient Details  Name: Danielle Braun MRN: [id number] Date of Birth: 1961/02/14 Referring Provider: Dr. Tania Ade   Encounter Date: 12/26/2017  PT End of Session - 12/26/17 0845    Visit Number  14    Number of Visits  17    Date for PT Re-Evaluation  01/14/18    Authorization Type  WC    Authorization - Visit Number  14    Authorization - Number of Visits  17    PT Start Time  6384    PT Stop Time  0935 ice pack    PT Time Calculation (min)  54 min    Activity Tolerance  Patient tolerated treatment well    Behavior During Therapy  [facility] for tasks assessed/performed       Past Medical History:  Diagnosis Date  . Acne   . Arrhythmia   . Bursitis, subacromial    Left  . Endometriosis   . GERD (gastroesophageal reflux disease)   . Sinusitis     Past Surgical History:  Procedure Laterality Date  . ABDOMINAL HYSTERECTOMY    . ABDOMINAL SURGERY     csection  . DILATION AND CURETTAGE OF UTERUS    . SHOULDER ARTHROSCOPY WITH SUBACROMIAL DECOMPRESSION Left 10/31/2017   Procedure: SHOULDER ARTHROSCOPY WITH BURSECTOMY AND SUBACROMIAL DECOMPRESSION;  Surgeon: Tania Ade, MD;  Location: [place];  Service: Orthopedics;  Laterality: Left;  Left shoulder arthroscopy bursectomy and subacromial decompression  . TONSILLECTOMY      There were no vitals filed for this visit.  Subjective Assessment - 12/26/17 0844    Subjective  having some "bone" aching at lower humerus    Diagnostic tests  MRI: Supraspinatus and infraspinatus tendinopathy without tear. Prominent subacromial/subdeltoid fluid consistent with bursitis. Mild acromioclavicular osteoarthritis - prior to surgery    Patient Stated Goals  prevent disability and pain, restore function and ROM, eliminate pain                       [facility] Adult PT Treatment/Exercise - 12/26/17 0847      Shoulder Exercises: Prone   Retraction  Strengthening;Both;15 reps;Weights    Retraction Weight (lbs)  2    Extension  Strengthening;Both;15 reps;Weights    Extension Weight (lbs)  1    External Rotation  Strengthening;Left;20 reps;Weights    External Rotation Weight (lbs)  1      Shoulder Exercises: Standing   Other Standing Exercises  resisted flexion/abduction - 11/9/7 oclock - red tband x 12      Shoulder Exercises: ROM/Strengthening   UBE (Upper Arm Bike)  L3 x 6 min    Ball on Wall  ball tap + active shoulder flexion/extension x 15 reps - green medball      Modalities   Modalities  Cryotherapy      Cryotherapy   Number Minutes Cryotherapy  10 Minutes    Cryotherapy Location  Shoulder    Type of Cryotherapy  Ice pack      Manual Therapy   Manual Therapy  Soft tissue mobilization;Myofascial release    Manual therapy comments  patient prone    Soft tissue mobilization  STM to L rhomboids, teres, subscap    Myofascial Release  manual trigger point release to L subscap  PT Short Term Goals - 12/19/17 0935      PT SHORT TERM GOAL #1   Title  patient to be independent with initial HEP    Status  Achieved      PT SHORT TERM GOAL #2   Title  patient to improve L shoulder AROM equal to that of R shoulder without pain limiting    Status  Achieved        PT Long Term Goals - 12/19/17 0936      PT LONG TERM GOAL #1   Title  patient to be independent with advanced HEP    Status  On-going      PT LONG TERM GOAL #2   Title  patient to improve L UE strength to >/= 4+/5 for improved function    Status  On-going      PT LONG TERM GOAL #3   Title  patient to report ability to perform ADLs and light household tasks without pain limiting    Status  On-going      PT LONG TERM GOAL #4   Title  Patient to report pain no greater than 2/10 for greater than 2 weeks     Status  On-going            Plan - 12/26/17 6812    Clinical Impression Statement  Reports 3 days of active shoulder motion with no pain. Some continued tightness at posterior capsule vs. subscap area with good response to [facility] today. Would consider DN to this area, however, difficulty due to reduced winging at L scapula. Good strengthening tolerance today with no increase in pain.     PT Treatment/Interventions  ADLs/Self Care Home Management;Cryotherapy;Electrical Stimulation;Iontophoresis 4mg /ml Dexamethasone;Moist Heat;Therapeutic activities;Therapeutic exercise;Ultrasound;Neuromuscular re-education;Patient/family education;Manual techniques;Vasopneumatic Device;Taping;Dry needling;Passive range of motion    Consulted and Agree with Plan of Care  Patient       Patient will benefit from skilled therapeutic intervention in order to improve the following deficits and impairments:  Pain, Impaired UE functional use, Decreased strength, Decreased range of motion, Decreased activity tolerance  Visit Diagnosis: Acute pain of left shoulder  Stiffness of left shoulder, not elsewhere classified  Abnormal posture  Muscle weakness (generalized)  Other symptoms and signs involving the musculoskeletal system     Problem List Patient Active Problem List   Diagnosis Date Noted  . ANKLE SPRAIN, LEFT 04/10/2009  . ACUTE MAXILLARY SINUSITIS 11/06/2008  . URI 11/06/2008     Lanney Gins, PT, DPT 12/26/17 10:48 AM   [facility] [address]  [place], [place], [address] Phone: [phone]   Fax:  [phone]  Name: CHANDREA ZELLMAN MRN: [id number] Date of Birth: 02-Apr-1961

## 2017-12-31 ENCOUNTER — Encounter: Payer: Self-pay | Admitting: Physical Therapy

## 2017-12-31 ENCOUNTER — Ambulatory Visit: Payer: PRIVATE HEALTH INSURANCE | Attending: Orthopedic Surgery | Admitting: Physical Therapy

## 2017-12-31 DIAGNOSIS — M25612 Stiffness of left shoulder, not elsewhere classified: Secondary | ICD-10-CM | POA: Diagnosis present

## 2017-12-31 DIAGNOSIS — M6281 Muscle weakness (generalized): Secondary | ICD-10-CM | POA: Diagnosis present

## 2017-12-31 DIAGNOSIS — R293 Abnormal posture: Secondary | ICD-10-CM | POA: Insufficient documentation

## 2017-12-31 DIAGNOSIS — R29898 Other symptoms and signs involving the musculoskeletal system: Secondary | ICD-10-CM | POA: Insufficient documentation

## 2017-12-31 DIAGNOSIS — M25512 Pain in left shoulder: Secondary | ICD-10-CM | POA: Insufficient documentation

## 2017-12-31 NOTE — Therapy (Signed)
[facility] [address]  [place], [place], [address] Phone: [phone]   Fax:  [phone]  Physical Therapy Treatment  Patient Details  Name: Danielle Braun MRN: [id number] Date of Birth: April 12, 1961 Referring Provider: Dr. Tania Ade   Encounter Date: 12/31/2017  PT End of Session - 12/31/17 1538    Visit Number  15    Number of Visits  17    Date for PT Re-Evaluation  01/14/18    Authorization Type  WC    Authorization - Visit Number  15    Authorization - Number of Visits  17    PT Start Time  7564    PT Stop Time  3329    PT Time Calculation (min)  40 min    Activity Tolerance  Patient tolerated treatment well    Behavior During Therapy  [facility] for tasks assessed/performed       Past Medical History:  Diagnosis Date  . Acne   . Arrhythmia   . Bursitis, subacromial    Left  . Endometriosis   . GERD (gastroesophageal reflux disease)   . Sinusitis     Past Surgical History:  Procedure Laterality Date  . ABDOMINAL HYSTERECTOMY    . ABDOMINAL SURGERY     csection  . DILATION AND CURETTAGE OF UTERUS    . SHOULDER ARTHROSCOPY WITH SUBACROMIAL DECOMPRESSION Left 10/31/2017   Procedure: SHOULDER ARTHROSCOPY WITH BURSECTOMY AND SUBACROMIAL DECOMPRESSION;  Surgeon: Tania Ade, MD;  Location: [place];  Service: Orthopedics;  Laterality: Left;  Left shoulder arthroscopy bursectomy and subacromial decompression  . TONSILLECTOMY      There were no vitals filed for this visit.  Subjective Assessment - 12/31/17 1538    Subjective  continued ER group pain - little pain yesterday    Diagnostic tests  MRI: Supraspinatus and infraspinatus tendinopathy without tear. Prominent subacromial/subdeltoid fluid consistent with bursitis. Mild acromioclavicular osteoarthritis - prior to surgery    Patient Stated Goals  prevent disability and pain, restore function and ROM, eliminate pain    Currently in Pain?   No/denies                      [facility] Adult PT Treatment/Exercise - 12/31/17 1539      Shoulder Exercises: Standing   External Rotation  Strengthening;Left;15 reps;Theraband 90/90    Theraband Level (Shoulder External Rotation)  Level 2 (Red)    Internal Rotation  Strengthening;Left;15 reps;Theraband 90/90    Theraband Level (Shoulder Internal Rotation)  Level 2 (Red)    Flexion  Strengthening;Both;10 reps;Weights    Shoulder Flexion Weight (lbs)  1    ABduction  Strengthening;Both;10 reps;Weights    Shoulder ABduction Weight (lbs)  1    Row  Strengthening;Both;15 reps TRX    Other Standing Exercises  resisted depression - LUE - green tband x 15    Other Standing Exercises  resisted tricep extension - green tband - L UE x 15 reps      Shoulder Exercises: ROM/Strengthening   UBE (Upper Arm Bike)  L3 x 6 min (3/3)    Cybex Row  15 reps 15# - narrow grip      Manual Therapy   Manual Therapy  Soft tissue mobilization;Myofascial release    Manual therapy comments  patient prone    Soft tissue mobilization  deep STM to L teres, infra, lats - taut bands noted + pain provocation    Myofascial Release  manual trigger point release to L infra, teres group               PT Short Term Goals - 12/19/17 0935      PT SHORT TERM GOAL #1   Title  patient to be independent with initial HEP    Status  Achieved      PT SHORT TERM GOAL #2   Title  patient to improve L shoulder AROM equal to that of R shoulder without pain limiting    Status  Achieved        PT Long Term Goals - 12/19/17 0936      PT LONG TERM GOAL #1   Title  patient to be independent with advanced HEP    Status  On-going      PT LONG TERM GOAL #2   Title  patient to improve L UE strength to >/= 4+/5 for improved function    Status  On-going      PT LONG TERM GOAL #3   Title  patient to report ability to perform ADLs and light household tasks without pain limiting    Status  On-going      PT  LONG TERM GOAL #4   Title  Patient to report pain no greater than 2/10 for greater than 2 weeks    Status  On-going            Plan - 12/31/17 1539    Clinical Impression Statement  Reports of pain free AROM at L shoulder for ~1 week. However, continued resting pain that is inconsistent from day to day. Making good progress with strengthening, but does continue to demonstrate tightness/taut bands in posterior shoulder complex with good relief by manual work. Tolerable to all increases in strenght training with no issue, other than patient hesitantcy due to fear of pain. Will continue to benefit from skilled PT to improve functional use of L shoulder.     PT Treatment/Interventions  ADLs/Self Care Home Management;Cryotherapy;Electrical Stimulation;Iontophoresis 4mg /ml Dexamethasone;Moist Heat;Therapeutic activities;Therapeutic exercise;Ultrasound;Neuromuscular re-education;Patient/family education;Manual techniques;Vasopneumatic Device;Taping;Dry needling;Passive range of motion    Consulted and Agree with Plan of Care  Patient       Patient will benefit from skilled therapeutic intervention in order to improve the following deficits and impairments:  Pain, Impaired UE functional use, Decreased strength, Decreased range of motion, Decreased activity tolerance  Visit Diagnosis: Acute pain of left shoulder  Stiffness of left shoulder, not elsewhere classified  Abnormal posture  Muscle weakness (generalized)  Other symptoms and signs involving the musculoskeletal system     Problem List Patient Active Problem List   Diagnosis Date Noted  . ANKLE SPRAIN, LEFT 04/10/2009  . ACUTE MAXILLARY SINUSITIS 11/06/2008  . URI 11/06/2008     Lanney Gins, PT, DPT 12/31/17 5:56 PM   [facility] [address]  [place], [place], [address] Phone: [phone]   Fax:  [phone]  Name: Danielle Braun MRN:  [id number] Date of Birth: 31-Oct-1960

## 2018-01-03 ENCOUNTER — Encounter: Payer: Self-pay | Admitting: Physical Therapy

## 2018-01-03 ENCOUNTER — Ambulatory Visit: Payer: PRIVATE HEALTH INSURANCE | Admitting: Physical Therapy

## 2018-01-03 DIAGNOSIS — M25512 Pain in left shoulder: Secondary | ICD-10-CM

## 2018-01-03 DIAGNOSIS — R293 Abnormal posture: Secondary | ICD-10-CM

## 2018-01-03 DIAGNOSIS — M25612 Stiffness of left shoulder, not elsewhere classified: Secondary | ICD-10-CM

## 2018-01-03 DIAGNOSIS — M6281 Muscle weakness (generalized): Secondary | ICD-10-CM

## 2018-01-03 DIAGNOSIS — R29898 Other symptoms and signs involving the musculoskeletal system: Secondary | ICD-10-CM

## 2018-01-03 NOTE — Therapy (Signed)
[facility] [address]  [place], [place], [address] Phone: [phone]   Fax:  [phone]  Physical Therapy Treatment  Patient Details  Name: Danielle Braun MRN: [id number] Date of Birth: 01-Sep-1961 Referring Provider: Dr. Tania Ade   Encounter Date: 01/03/2018  PT End of Session - 01/03/18 0835    Visit Number  16    Number of Visits  17    Date for PT Re-Evaluation  01/14/18    Authorization Type  WC    Authorization - Visit Number  16    Authorization - Number of Visits  17    PT Start Time  0829    PT Stop Time  0925    PT Time Calculation (min)  56 min    Activity Tolerance  Patient tolerated treatment well    Behavior During Therapy  [facility] for tasks assessed/performed       Past Medical History:  Diagnosis Date  . Acne   . Arrhythmia   . Bursitis, subacromial    Left  . Endometriosis   . GERD (gastroesophageal reflux disease)   . Sinusitis     Past Surgical History:  Procedure Laterality Date  . ABDOMINAL HYSTERECTOMY    . ABDOMINAL SURGERY     csection  . DILATION AND CURETTAGE OF UTERUS    . SHOULDER ARTHROSCOPY WITH SUBACROMIAL DECOMPRESSION Left 10/31/2017   Procedure: SHOULDER ARTHROSCOPY WITH BURSECTOMY AND SUBACROMIAL DECOMPRESSION;  Surgeon: Tania Ade, MD;  Location: [place];  Service: Orthopedics;  Laterality: Left;  Left shoulder arthroscopy bursectomy and subacromial decompression  . TONSILLECTOMY      There were no vitals filed for this visit.  Subjective Assessment - 01/03/18 0834    Subjective  saw MD - pleased with current progress    Diagnostic tests  MRI: Supraspinatus and infraspinatus tendinopathy without tear. Prominent subacromial/subdeltoid fluid consistent with bursitis. Mild acromioclavicular osteoarthritis - prior to surgery    Patient Stated Goals  prevent disability and pain, restore function and ROM, eliminate pain    Currently in Pain?  No/denies    Pain Score  0-No pain                      [facility] Adult PT Treatment/Exercise - 01/03/18 0836      Shoulder Exercises: Prone   Extension  Strengthening;Both;15 reps;Weights prone "I" over green pball    Extension Weight (lbs)  2    Horizontal ABduction 1  Strengthening;Both;15 reps;Weights prone "T" over green pball    Horizontal ABduction 1 Weight (lbs)  1    Horizontal ABduction 2  Strengthening;Both;15 reps;Weights prone "Y" over green pball    Horizontal ABduction 2 Weight (lbs)  1      Shoulder Exercises: Standing   Row  Strengthening;Both;15 reps TRX    Other Standing Exercises  upright row - cable column -10# x 15      Shoulder Exercises: ROM/Strengthening   UBE (Upper Arm Bike)  L3 x 6 min (3/3)    X to V Arms  yellow tband x 15 reps    Other ROM/Strengthening Exercises  BATCA pulldown - 15# x 15 reps      Modalities   Modalities  Cryotherapy      Cryotherapy   Number Minutes Cryotherapy  15 Minutes    Cryotherapy Location  Shoulder    Type of Cryotherapy  Ice pack      Manual Therapy  Manual Therapy  Taping    Kinesiotex  Inhibit Muscle      Kinesiotix   Inhibit Muscle   infraspinatus pattern               PT Short Term Goals - 12/19/17 0935      PT SHORT TERM GOAL #1   Title  patient to be independent with initial HEP    Status  Achieved      PT SHORT TERM GOAL #2   Title  patient to improve L shoulder AROM equal to that of R shoulder without pain limiting    Status  Achieved        PT Long Term Goals - 12/19/17 0936      PT LONG TERM GOAL #1   Title  patient to be independent with advanced HEP    Status  On-going      PT LONG TERM GOAL #2   Title  patient to improve L UE strength to >/= 4+/5 for improved function    Status  On-going      PT LONG TERM GOAL #3   Title  patient to report ability to perform ADLs and light household tasks without pain limiting    Status  On-going      PT LONG TERM GOAL #4   Title  Patient  to report pain no greater than 2/10 for greater than 2 weeks    Status  On-going            Plan - 01/03/18 0836    Clinical Impression Statement  Seen by Dr. Tamera Punt this week - thinks patient is progressing appropriately. Has made good progress with strength and ROM. Does report pain in infraspinatus/teres region today - slight exacerbaton due to stopping mobic. Taping pattern to this area for hopeful pain relief. Likely to extend POC for short period at next visit to prepare patinet for d/c and instruct patient on comprehensive HEP for continued improvements in functional mobility.     PT Treatment/Interventions  ADLs/Self Care Home Management;Cryotherapy;Electrical Stimulation;Iontophoresis 4mg /ml Dexamethasone;Moist Heat;Therapeutic activities;Therapeutic exercise;Ultrasound;Neuromuscular re-education;Patient/family education;Manual techniques;Vasopneumatic Device;Taping;Dry needling;Passive range of motion    Consulted and Agree with Plan of Care  Patient       Patient will benefit from skilled therapeutic intervention in order to improve the following deficits and impairments:  Pain, Impaired UE functional use, Decreased strength, Decreased range of motion, Decreased activity tolerance  Visit Diagnosis: Acute pain of left shoulder  Stiffness of left shoulder, not elsewhere classified  Abnormal posture  Muscle weakness (generalized)  Other symptoms and signs involving the musculoskeletal system     Problem List Patient Active Problem List   Diagnosis Date Noted  . ANKLE SPRAIN, LEFT 04/10/2009  . ACUTE MAXILLARY SINUSITIS 11/06/2008  . URI 11/06/2008     Lanney Gins, PT, DPT 01/03/18 9:53 AM   [facility] [address]  [place], [place], [address] Phone: [phone]   Fax:  [phone]  Name: Danielle Braun MRN: [id number] Date of Birth: 04-09-1961

## 2018-01-09 ENCOUNTER — Ambulatory Visit: Payer: PRIVATE HEALTH INSURANCE | Admitting: Physical Therapy

## 2018-01-09 ENCOUNTER — Encounter: Payer: Self-pay | Admitting: Physical Therapy

## 2018-01-09 DIAGNOSIS — M25512 Pain in left shoulder: Secondary | ICD-10-CM | POA: Diagnosis not present

## 2018-01-09 DIAGNOSIS — M25612 Stiffness of left shoulder, not elsewhere classified: Secondary | ICD-10-CM

## 2018-01-09 DIAGNOSIS — M6281 Muscle weakness (generalized): Secondary | ICD-10-CM

## 2018-01-09 DIAGNOSIS — R29898 Other symptoms and signs involving the musculoskeletal system: Secondary | ICD-10-CM

## 2018-01-09 DIAGNOSIS — R293 Abnormal posture: Secondary | ICD-10-CM

## 2018-01-09 NOTE — Therapy (Signed)
[facility] [address]  [place], [place], [address] Phone: [phone]   Fax:  [phone]  Physical Therapy Treatment  Patient Details  Name: Danielle Braun MRN: [id number] Date of Birth: [birth year] Referring Provider: Dr. Tania Ade   Encounter Date: 01/09/2018  PT End of Session - 01/09/18 1202    Visit Number  17    Number of Visits  27    Date for PT Re-Evaluation  01/14/18    Authorization Type  WC - recently approved for 10 more visits    Authorization - Visit Number  52    Authorization - Number of Visits  27    PT Start Time  0929    PT Stop Time  1026    PT Time Calculation (min)  57 min    Activity Tolerance  Patient tolerated treatment well    Behavior During Therapy  [facility] for tasks assessed/performed       Past Medical History:  Diagnosis Date  . Acne   . Arrhythmia   . Bursitis, subacromial    Left  . Endometriosis   . GERD (gastroesophageal reflux disease)   . Sinusitis     Past Surgical History:  Procedure Laterality Date  . ABDOMINAL HYSTERECTOMY    . ABDOMINAL SURGERY     csection  . DILATION AND CURETTAGE OF UTERUS    . SHOULDER ARTHROSCOPY WITH SUBACROMIAL DECOMPRESSION Left 10/31/2017   Procedure: SHOULDER ARTHROSCOPY WITH BURSECTOMY AND SUBACROMIAL DECOMPRESSION;  Surgeon: Tania Ade, MD;  Location: [place];  Service: Orthopedics;  Laterality: Left;  Left shoulder arthroscopy bursectomy and subacromial decompression  . TONSILLECTOMY      There were no vitals filed for this visit.  Subjective Assessment - 01/09/18 1200    Subjective  has been doing some research - thinks issues may be arising from subscap - brought in detailed picture outlining pain as well as research article    Diagnostic tests  MRI: Supraspinatus and infraspinatus tendinopathy without tear. Prominent subacromial/subdeltoid fluid consistent with bursitis. Mild acromioclavicular osteoarthritis - prior  to surgery    Patient Stated Goals  prevent disability and pain, restore function and ROM, eliminate pain    Currently in Pain?  Yes    Pain Score  2     Pain Location  Shoulder    Pain Orientation  Left    Pain Descriptors / Indicators  Aching;Sore                      [facility] Adult PT Treatment/Exercise - 01/09/18 0936      Shoulder Exercises: Seated   Horizontal ABduction  Strengthening;Both;15 reps;Theraband    Theraband Level (Shoulder Horizontal ABduction)  Level 2 (Red)    External Rotation  Strengthening;Both;15 reps;Theraband    Theraband Level (Shoulder External Rotation)  Level 2 (Red)      Shoulder Exercises: ROM/Strengthening   UBE (Upper Arm Bike)  L3 x 6 min (3/3)      Modalities   Modalities  Cryotherapy      Cryotherapy   Number Minutes Cryotherapy  15 Minutes    Cryotherapy Location  Shoulder    Type of Cryotherapy  Ice pack      Manual Therapy   Manual Therapy  Soft tissue mobilization;Myofascial release    Manual therapy comments  patient prone    Soft tissue mobilization  STM to L infra, teres, lats, UT    Myofascial Release  manual trigger point release to L lats and teres group       Trigger Point Dry Needling - 01/09/18 1013    Consent Given?  Yes    Muscles Treated Upper Body  Infraspinatus lats and teres group    Infraspinatus Response  Twitch response elicited;Palpable increased muscle length             PT Short Term Goals - 12/19/17 0935      PT SHORT TERM GOAL #1   Title  patient to be independent with initial HEP    Status  Achieved      PT SHORT TERM GOAL #2   Title  patient to improve L shoulder AROM equal to that of R shoulder without pain limiting    Status  Achieved        PT Long Term Goals - 12/19/17 0936      PT LONG TERM GOAL #1   Title  patient to be independent with advanced HEP    Status  On-going      PT LONG TERM GOAL #2   Title  patient to improve L UE strength to >/= 4+/5 for improved  function    Status  On-going      PT LONG TERM GOAL #3   Title  patient to report ability to perform ADLs and light household tasks without pain limiting    Status  On-going      PT LONG TERM GOAL #4   Title  Patient to report pain no greater than 2/10 for greater than 2 weeks    Status  On-going            Plan - 01/09/18 1204    Clinical Impression Statement  Has been off of mobic for some time now - reporting increases in resting pain as well as pain with AROM into flexion with painful arc when returning to neutral. Patient bringin in detailed picture of pain outlined as well as questioning possible subscap involvement. Very little winging at L scapula, therefore, unable to DN subscap area. DN to lateral border of scapula with excellent response noted - also plans to have massage later today. Tolerable to all activation/strengthening tasks following DN. Will continue to progress as pateint tolerates.     PT Treatment/Interventions  ADLs/Self Care Home Management;Cryotherapy;Electrical Stimulation;Iontophoresis 4mg /ml Dexamethasone;Moist Heat;Therapeutic activities;Therapeutic exercise;Ultrasound;Neuromuscular re-education;Patient/family education;Manual techniques;Vasopneumatic Device;Taping;Dry needling;Passive range of motion    Consulted and Agree with Plan of Care  Patient       Patient will benefit from skilled therapeutic intervention in order to improve the following deficits and impairments:  Pain, Impaired UE functional use, Decreased strength, Decreased range of motion, Decreased activity tolerance  Visit Diagnosis: Acute pain of left shoulder  Stiffness of left shoulder, not elsewhere classified  Abnormal posture  Muscle weakness (generalized)  Other symptoms and signs involving the musculoskeletal system     Problem List Patient Active Problem List   Diagnosis Date Noted  . ANKLE SPRAIN, LEFT 04/10/2009  . ACUTE MAXILLARY SINUSITIS 11/06/2008  . URI  11/06/2008     Lanney Gins, PT, DPT 01/09/18 12:30 PM   [facility] [address]  [place], [place], [address] Phone: [phone]   Fax:  [phone]  Name: Danielle Braun MRN: [id number] Date of Birth: 04-29-1961

## 2018-01-13 ENCOUNTER — Encounter: Payer: Self-pay | Admitting: Physical Therapy

## 2018-01-13 ENCOUNTER — Ambulatory Visit: Payer: PRIVATE HEALTH INSURANCE | Admitting: Physical Therapy

## 2018-01-13 DIAGNOSIS — M25612 Stiffness of left shoulder, not elsewhere classified: Secondary | ICD-10-CM

## 2018-01-13 DIAGNOSIS — M6281 Muscle weakness (generalized): Secondary | ICD-10-CM

## 2018-01-13 DIAGNOSIS — R29898 Other symptoms and signs involving the musculoskeletal system: Secondary | ICD-10-CM

## 2018-01-13 DIAGNOSIS — M25512 Pain in left shoulder: Secondary | ICD-10-CM

## 2018-01-13 DIAGNOSIS — R293 Abnormal posture: Secondary | ICD-10-CM

## 2018-01-13 NOTE — Therapy (Signed)
[facility] [address]  [place], [place], [address] Phone: [phone]   Fax:  [phone]  Physical Therapy Treatment  Patient Details  Name: Danielle Braun MRN: [id number] Date of Birth: [birth year] Referring Provider: Dr. Tania Ade   Encounter Date: 01/13/2018  PT End of Session - 01/13/18 1526    Visit Number  18    Number of Visits  27    Date for PT Re-Evaluation  01/14/18    Authorization Type  WC - recently approved for 10 more visits    Authorization - Visit Number  18    Authorization - Number of Visits  27    PT Start Time  1522    PT Stop Time  1612    PT Time Calculation (min)  50 min    Activity Tolerance  Patient tolerated treatment well    Behavior During Therapy  [facility] for tasks assessed/performed       Past Medical History:  Diagnosis Date  . Acne   . Arrhythmia   . Bursitis, subacromial    Left  . Endometriosis   . GERD (gastroesophageal reflux disease)   . Sinusitis     Past Surgical History:  Procedure Laterality Date  . ABDOMINAL HYSTERECTOMY    . ABDOMINAL SURGERY     csection  . DILATION AND CURETTAGE OF UTERUS    . SHOULDER ARTHROSCOPY WITH SUBACROMIAL DECOMPRESSION Left 10/31/2017   Procedure: SHOULDER ARTHROSCOPY WITH BURSECTOMY AND SUBACROMIAL DECOMPRESSION;  Surgeon: Tania Ade, MD;  Location: [place];  Service: Orthopedics;  Laterality: Left;  Left shoulder arthroscopy bursectomy and subacromial decompression  . TONSILLECTOMY      There were no vitals filed for this visit.  Subjective Assessment - 01/13/18 1523    Subjective  feels like she is getting worse - having hot flashes again; pain is coming back - feels like she felt when it was acute - has left a message for the doctor    Diagnostic tests  MRI: Supraspinatus and infraspinatus tendinopathy without tear. Prominent subacromial/subdeltoid fluid consistent with bursitis. Mild acromioclavicular  osteoarthritis - prior to surgery    Patient Stated Goals  prevent disability and pain, restore function and ROM, eliminate pain    Currently in Pain?  Yes    Pain Score  5     Pain Location  Shoulder    Pain Orientation  Left    Pain Descriptors / Indicators  Aching;Sore;Discomfort    Pain Type  Acute pain;Surgical pain                      [facility] Adult PT Treatment/Exercise - 01/13/18 0001      Shoulder Exercises: Supine   Other Supine Exercises  pec stretch over foam roller - PT guiding L UE into stretch      Shoulder Exercises: Standing   Flexion  Strengthening;Both;15 reps;Weights    Shoulder Flexion Weight (lbs)  1    ABduction  Strengthening;Both;15 reps;Weights    Shoulder ABduction Weight (lbs)  1    Row  Strengthening;Both;15 reps TRX      Shoulder Exercises: ROM/Strengthening   UBE (Upper Arm Bike)  L3 x 6 min (3/3)      Shoulder Exercises: Body Blade   Flexion  30 seconds;1 rep    ABduction  30 seconds;1 rep      Manual Therapy   Manual Therapy  Soft tissue mobilization;Myofascial release    Manual  therapy comments  patient supine    Soft tissue mobilization  STM to L bicep, pec, posterior shoulder complex    Myofascial Release  manual trigger point release to L bicep and L posterior shoulder.               PT Short Term Goals - 12/19/17 0935      PT SHORT TERM GOAL #1   Title  patient to be independent with initial HEP    Status  Achieved      PT SHORT TERM GOAL #2   Title  patient to improve L shoulder AROM equal to that of R shoulder without pain limiting    Status  Achieved        PT Long Term Goals - 12/19/17 0936      PT LONG TERM GOAL #1   Title  patient to be independent with advanced HEP    Status  On-going      PT LONG TERM GOAL #2   Title  patient to improve L UE strength to >/= 4+/5 for improved function    Status  On-going      PT LONG TERM GOAL #3   Title  patient to report ability to perform ADLs and light  household tasks without pain limiting    Status  On-going      PT LONG TERM GOAL #4   Title  Patient to report pain no greater than 2/10 for greater than 2 weeks    Status  On-going            Plan - 01/13/18 1527    Clinical Impression Statement  Patient with subjective reports of increased daily pain as well as pain with general active motion at L shoulder. Feels as if symptoms started after competing anti-inflammatory medications per MD request. Patient today with TTP at L bicep with taut bands noted as well as at pec and posterior shoulder complex. Patient today also with painful AROM into full abduction - of which, was not producing pain prior to this visit. Subjective reports also include systemic "hot flashes" with no known aggravating factors. Patient has reached out to MD/PA as well as PT sending note over requesting advice regarding pateint care - break from PT vs continued STM/ther ex/mobility.    PT Treatment/Interventions  ADLs/Self Care Home Management;Cryotherapy;Electrical Stimulation;Iontophoresis 4mg /ml Dexamethasone;Moist Heat;Therapeutic activities;Therapeutic exercise;Ultrasound;Neuromuscular re-education;Patient/family education;Manual techniques;Vasopneumatic Device;Taping;Dry needling;Passive range of motion    Consulted and Agree with Plan of Care  Patient       Patient will benefit from skilled therapeutic intervention in order to improve the following deficits and impairments:  Pain, Impaired UE functional use, Decreased strength, Decreased range of motion, Decreased activity tolerance  Visit Diagnosis: Acute pain of left shoulder  Stiffness of left shoulder, not elsewhere classified  Abnormal posture  Muscle weakness (generalized)  Other symptoms and signs involving the musculoskeletal system     Problem List Patient Active Problem List   Diagnosis Date Noted  . ANKLE SPRAIN, LEFT 04/10/2009  . ACUTE MAXILLARY SINUSITIS 11/06/2008  . URI  11/06/2008     Lanney Gins, PT, DPT 01/13/18 [facility] [address]  [place], [place], [address] Phone: [phone]   Fax:  [phone]  Name: MIRIAM LILES MRN: [id number] Date of Birth: 01/17/1961

## 2018-01-16 ENCOUNTER — Encounter: Payer: Self-pay | Admitting: Physical Therapy

## 2018-01-16 ENCOUNTER — Ambulatory Visit: Payer: PRIVATE HEALTH INSURANCE | Admitting: Physical Therapy

## 2018-01-16 DIAGNOSIS — R29898 Other symptoms and signs involving the musculoskeletal system: Secondary | ICD-10-CM

## 2018-01-16 DIAGNOSIS — M25512 Pain in left shoulder: Secondary | ICD-10-CM | POA: Diagnosis not present

## 2018-01-16 DIAGNOSIS — M25612 Stiffness of left shoulder, not elsewhere classified: Secondary | ICD-10-CM

## 2018-01-16 DIAGNOSIS — R293 Abnormal posture: Secondary | ICD-10-CM

## 2018-01-16 DIAGNOSIS — M6281 Muscle weakness (generalized): Secondary | ICD-10-CM

## 2018-01-16 NOTE — Therapy (Signed)
[facility] [address]  [place], [place], [address] Phone: [phone]   Fax:  [phone]  Physical Therapy Treatment  Patient Details  Name: Danielle Braun MRN: [id number] Date of Birth: February 15, 1961 Referring Provider: Dr. Tania Ade   Encounter Date: 01/16/2018  PT End of Session - 01/16/18 1347    Visit Number  19    Number of Visits  27    Date for PT Re-Evaluation  01/14/18    Authorization Type  WC - recently approved for 10 more visits    Authorization - Visit Number  18    Authorization - Number of Visits  27    PT Start Time  9935    PT Stop Time  1444    PT Time Calculation (min)  57 min    Activity Tolerance  Patient tolerated treatment well    Behavior During Therapy  [facility] for tasks assessed/performed       Past Medical History:  Diagnosis Date  . Acne   . Arrhythmia   . Bursitis, subacromial    Left  . Endometriosis   . GERD (gastroesophageal reflux disease)   . Sinusitis     Past Surgical History:  Procedure Laterality Date  . ABDOMINAL HYSTERECTOMY    . ABDOMINAL SURGERY     csection  . DILATION AND CURETTAGE OF UTERUS    . SHOULDER ARTHROSCOPY WITH SUBACROMIAL DECOMPRESSION Left 10/31/2017   Procedure: SHOULDER ARTHROSCOPY WITH BURSECTOMY AND SUBACROMIAL DECOMPRESSION;  Surgeon: Tania Ade, MD;  Location: [place];  Service: Orthopedics;  Laterality: Left;  Left shoulder arthroscopy bursectomy and subacromial decompression  . TONSILLECTOMY      There were no vitals filed for this visit.  Subjective Assessment - 01/16/18 1349    Subjective  Pt started back on 1/2 dose Meloxicam and notes maybe slight improvement with this. Still having the 'hot flashes" that correlate with the increased pain in her shoulder - on wait list for appt with PCP for this.    Diagnostic tests  MRI: Supraspinatus and infraspinatus tendinopathy without tear. Prominent subacromial/subdeltoid fluid  consistent with bursitis. Mild acromioclavicular osteoarthritis - prior to surgery    Patient Stated Goals  prevent disability and pain, restore function and ROM, eliminate pain    Currently in Pain?  Yes    Pain Score  4     Pain Location  Shoulder    Pain Orientation  Left    Pain Type  Acute pain;Surgical pain                      [facility] Adult PT Treatment/Exercise - 01/16/18 1347      Exercises   Exercises  Shoulder      Shoulder Exercises: Therapy Ball   Other Therapy Ball Exercises  Serratus roll-up on orange Pball + isometric ER with looped yellow TB x10      Shoulder Exercises: ROM/Strengthening   UBE (Upper Arm Bike)  L3 x 6 min (3/3)    Wall Pushups  15 reps    Wall Pushups Limitations  + serratus push-up on orange ball      Modalities   Modalities  Cryotherapy      Cryotherapy   Number Minutes Cryotherapy  13 Minutes    Cryotherapy Location  Shoulder    Type of Cryotherapy  Ice pack      Manual Therapy   Manual Therapy  Soft tissue mobilization;Myofascial release;Scapular mobilization;Taping  Manual therapy comments  pt in R sidelying    Soft tissue mobilization  STM to teres group, lats and lateral subscapularis + rhmoboids and medial edge on subscapularis    Myofascial Release  manual TPR to teres froup & subscapularis    Scapular Mobilization  L scapula - A/P and lateral rotation    Kinesiotex  Inhibit Muscle      Kinesiotix   Inhibit Muscle   infraspinatus pattern               PT Short Term Goals - 12/19/17 0935      PT SHORT TERM GOAL #1   Title  patient to be independent with initial HEP    Status  Achieved      PT SHORT TERM GOAL #2   Title  patient to improve L shoulder AROM equal to that of R shoulder without pain limiting    Status  Achieved        PT Long Term Goals - 12/19/17 0936      PT LONG TERM GOAL #1   Title  patient to be independent with advanced HEP    Status  On-going      PT LONG TERM GOAL #2    Title  patient to improve L UE strength to >/= 4+/5 for improved function    Status  On-going      PT LONG TERM GOAL #3   Title  patient to report ability to perform ADLs and light household tasks without pain limiting    Status  On-going      PT LONG TERM GOAL #4   Title  Patient to report pain no greater than 2/10 for greater than 2 weeks    Status  On-going            Plan - 01/16/18 1353    Clinical Impression Statement  Danielle Braun noting slight relief since starting back on 1/2 dose of Meloxicam. Feels like she still has full ROM but feels restricted due to conttinued tightness in posterio capsule, therefore targeted manual therapy to this area along with scapular mobs to promote improved scapulohumeral rhythm with overhead motions. Pt noting less of anterio shoudler pain with overehead flexion following this.    PT Treatment/Interventions  ADLs/Self Care Home Management;Cryotherapy;Electrical Stimulation;Iontophoresis 4mg /ml Dexamethasone;Moist Heat;Therapeutic activities;Therapeutic exercise;Ultrasound;Neuromuscular re-education;Patient/family education;Manual techniques;Vasopneumatic Device;Taping;Dry needling;Passive range of motion    Consulted and Agree with Plan of Care  Patient       Patient will benefit from skilled therapeutic intervention in order to improve the following deficits and impairments:  Pain, Impaired UE functional use, Decreased strength, Decreased range of motion, Decreased activity tolerance  Visit Diagnosis: Acute pain of left shoulder  Stiffness of left shoulder, not elsewhere classified  Abnormal posture  Muscle weakness (generalized)  Other symptoms and signs involving the musculoskeletal system     Problem List Patient Active Problem List   Diagnosis Date Noted  . ANKLE SPRAIN, LEFT 04/10/2009  . ACUTE MAXILLARY SINUSITIS 11/06/2008  . URI 11/06/2008    Percival Spanish, PT, MPT 01/16/2018, 2:45 PM  [facility] [address]  [employer] [place], [place], [address] Phone: [phone]   Fax:  [phone]  Name: Danielle Braun MRN: [id number] Date of Birth: [birth year]

## 2018-01-20 ENCOUNTER — Ambulatory Visit: Payer: PRIVATE HEALTH INSURANCE | Admitting: Physical Therapy

## 2018-01-20 ENCOUNTER — Encounter: Payer: Self-pay | Admitting: Physical Therapy

## 2018-01-20 DIAGNOSIS — R293 Abnormal posture: Secondary | ICD-10-CM

## 2018-01-20 DIAGNOSIS — M6281 Muscle weakness (generalized): Secondary | ICD-10-CM

## 2018-01-20 DIAGNOSIS — M25512 Pain in left shoulder: Secondary | ICD-10-CM

## 2018-01-20 DIAGNOSIS — R29898 Other symptoms and signs involving the musculoskeletal system: Secondary | ICD-10-CM

## 2018-01-20 DIAGNOSIS — M25612 Stiffness of left shoulder, not elsewhere classified: Secondary | ICD-10-CM

## 2018-01-20 NOTE — Therapy (Signed)
[facility] [address]  [place], [place], [address] Phone: [phone]   Fax:  [phone]  Physical Therapy Treatment  Patient Details  Name: Danielle Braun MRN: [id number] Date of Birth: 04-18-1961 Referring Provider: Dr. Tania Ade   Encounter Date: 01/20/2018  PT End of Session - 01/20/18 0755    Visit Number  20    Number of Visits  27    Date for PT Re-Evaluation  01/14/18    Authorization Type  WC - recently approved for 10 more visits    Authorization - Visit Number  20    Authorization - Number of Visits  27    PT Start Time  6967    PT Stop Time  8938 ice at end of session    PT Time Calculation (min)  55 min    Activity Tolerance  Patient tolerated treatment well    Behavior During Therapy  [facility] for tasks assessed/performed       Past Medical History:  Diagnosis Date  . Acne   . Arrhythmia   . Bursitis, subacromial    Left  . Endometriosis   . GERD (gastroesophageal reflux disease)   . Sinusitis     Past Surgical History:  Procedure Laterality Date  . ABDOMINAL HYSTERECTOMY    . ABDOMINAL SURGERY     csection  . DILATION AND CURETTAGE OF UTERUS    . SHOULDER ARTHROSCOPY WITH SUBACROMIAL DECOMPRESSION Left 10/31/2017   Procedure: SHOULDER ARTHROSCOPY WITH BURSECTOMY AND SUBACROMIAL DECOMPRESSION;  Surgeon: Tania Ade, MD;  Location: [place];  Service: Orthopedics;  Laterality: Left;  Left shoulder arthroscopy bursectomy and subacromial decompression  . TONSILLECTOMY      There were no vitals filed for this visit.  Subjective Assessment - 01/20/18 0753    Subjective  Resting pain has been reduced since starting meloxicam - had some pinching/grabbing pain over the weeknd, but was moving/cleaning quite a bit    Diagnostic tests  MRI: Supraspinatus and infraspinatus tendinopathy without tear. Prominent subacromial/subdeltoid fluid consistent with bursitis. Mild acromioclavicular  osteoarthritis - prior to surgery    Patient Stated Goals  prevent disability and pain, restore function and ROM, eliminate pain    Currently in Pain?  Yes    Pain Score  1  up to 6/10    Pain Location  Shoulder    Pain Orientation  Left    Pain Descriptors / Indicators  Sore    Pain Type  Acute pain;Surgical pain                No data recorded       [facility] Adult PT Treatment/Exercise - 01/20/18 0758      Shoulder Exercises: Sidelying   External Rotation  Strengthening;Left;15 reps;Weights 2 sets    External Rotation Weight (lbs)  3    ABduction  Strengthening;Left;15 reps;Weights    ABduction Weight (lbs)  3      Shoulder Exercises: Standing   Other Standing Exercises  resisted tricep extension - cable column - 15# x 15      Shoulder Exercises: ROM/Strengthening   UBE (Upper Arm Bike)  L2.5 x 6 (3/3)    Cybex Row  15 reps 15# - narrow grip    Ball on Wall  2# at L wrist - walking orange pball up wall x 15      Modalities   Modalities  Cryotherapy      Cryotherapy   Number Minutes  Cryotherapy  10 Minutes    Cryotherapy Location  Shoulder    Type of Cryotherapy  Ice pack      Manual Therapy   Manual Therapy  Soft tissue mobilization;Passive ROM    Manual therapy comments  pt in R sidelying    Soft tissue mobilization  STM to L shoulder complex - teres, rhomboids, subscap, lats    Myofascial Release  manual trigger point release to lateral lats and subscap               PT Short Term Goals - 12/19/17 0935      PT SHORT TERM GOAL #1   Title  patient to be independent with initial HEP    Status  Achieved      PT SHORT TERM GOAL #2   Title  patient to improve L shoulder AROM equal to that of R shoulder without pain limiting    Status  Achieved        PT Long Term Goals - 12/19/17 0936      PT LONG TERM GOAL #1   Title  patient to be independent with advanced HEP    Status  On-going      PT LONG TERM GOAL #2   Title  patient to improve L  UE strength to >/= 4+/5 for improved function    Status  On-going      PT LONG TERM GOAL #3   Title  patient to report ability to perform ADLs and light household tasks without pain limiting    Status  On-going      PT LONG TERM GOAL #4   Title  Patient to report pain no greater than 2/10 for greater than 2 weeks    Status  On-going            Plan - 01/20/18 0758    Clinical Impression Statement  Danielle Braun continuing to notice reduction in pain levels since starting meloxicam - able to work in yard over the weekend with very litle resting pain. Contiued manual therapsy at posterior shoulder complex due to noted taut bands with good tolerance. Good progression of strengthening, otherwise.     PT Treatment/Interventions  ADLs/Self Care Home Management;Cryotherapy;Electrical Stimulation;Iontophoresis 4mg /ml Dexamethasone;Moist Heat;Therapeutic activities;Therapeutic exercise;Ultrasound;Neuromuscular re-education;Patient/family education;Manual techniques;Vasopneumatic Device;Taping;Dry needling;Passive range of motion    Consulted and Agree with Plan of Care  Patient       Patient will benefit from skilled therapeutic intervention in order to improve the following deficits and impairments:  Pain, Impaired UE functional use, Decreased strength, Decreased range of motion, Decreased activity tolerance  Visit Diagnosis: Acute pain of left shoulder  Stiffness of left shoulder, not elsewhere classified  Abnormal posture  Muscle weakness (generalized)  Other symptoms and signs involving the musculoskeletal system     Problem List Patient Active Problem List   Diagnosis Date Noted  . ANKLE SPRAIN, LEFT 04/10/2009  . ACUTE MAXILLARY SINUSITIS 11/06/2008  . URI 11/06/2008     Danielle Braun, PT, DPT 01/20/18 8:54 AM  [facility] [address]  [place], [place], [address] Phone: [phone]   Fax:   [phone]  Name: Danielle Braun MRN: [id number] Date of Birth: [birth year]

## 2018-01-22 DIAGNOSIS — G8929 Other chronic pain: Secondary | ICD-10-CM | POA: Diagnosis not present

## 2018-01-22 DIAGNOSIS — I491 Atrial premature depolarization: Secondary | ICD-10-CM | POA: Diagnosis not present

## 2018-01-22 DIAGNOSIS — R6889 Other general symptoms and signs: Secondary | ICD-10-CM | POA: Diagnosis not present

## 2018-01-22 DIAGNOSIS — M25512 Pain in left shoulder: Secondary | ICD-10-CM | POA: Diagnosis not present

## 2018-01-22 MED FILL — VENLAFAXINE HCL ER 37.5 MG: 37.5 | 30 days supply | Qty: 60 | Fill #0

## 2018-01-23 ENCOUNTER — Encounter: Payer: Self-pay | Admitting: Physical Therapy

## 2018-01-23 ENCOUNTER — Ambulatory Visit: Payer: PRIVATE HEALTH INSURANCE | Admitting: Physical Therapy

## 2018-01-23 DIAGNOSIS — M6281 Muscle weakness (generalized): Secondary | ICD-10-CM

## 2018-01-23 DIAGNOSIS — M25512 Pain in left shoulder: Secondary | ICD-10-CM

## 2018-01-23 DIAGNOSIS — R29898 Other symptoms and signs involving the musculoskeletal system: Secondary | ICD-10-CM

## 2018-01-23 DIAGNOSIS — M25612 Stiffness of left shoulder, not elsewhere classified: Secondary | ICD-10-CM

## 2018-01-23 DIAGNOSIS — R293 Abnormal posture: Secondary | ICD-10-CM

## 2018-01-23 NOTE — Therapy (Signed)
[facility] [address]  [place], [place], [address] Phone: [phone]   Fax:  [phone]  Physical Therapy Treatment  Patient Details  Name: Danielle Braun MRN: [id number] Date of Birth: [birth year] Referring Provider: Dr. Tania Ade   Encounter Date: 01/23/2018  PT End of Session - 01/23/18 1531    Visit Number  21    Number of Visits  27    Date for PT Re-Evaluation  01/14/18    Authorization Type  WC - recently approved for 10 more visits    Authorization - Visit Number  21    Authorization - Number of Visits  27    PT Start Time  3664    PT Stop Time  1612    PT Time Calculation (min)  41 min    Activity Tolerance  Patient tolerated treatment well    Behavior During Therapy  [facility] for tasks assessed/performed       Past Medical History:  Diagnosis Date  . Acne   . Arrhythmia   . Bursitis, subacromial    Left  . Endometriosis   . GERD (gastroesophageal reflux disease)   . Sinusitis     Past Surgical History:  Procedure Laterality Date  . ABDOMINAL HYSTERECTOMY    . ABDOMINAL SURGERY     csection  . DILATION AND CURETTAGE OF UTERUS    . SHOULDER ARTHROSCOPY WITH SUBACROMIAL DECOMPRESSION Left 10/31/2017   Procedure: SHOULDER ARTHROSCOPY WITH BURSECTOMY AND SUBACROMIAL DECOMPRESSION;  Surgeon: Tania Ade, MD;  Location: [facility];  Service: Orthopedics;  Laterality: Left;  Left shoulder arthroscopy bursectomy and subacromial decompression  . TONSILLECTOMY      There were no vitals filed for this visit.  Subjective Assessment - 01/23/18 1534    Subjective  Continued improvement in pain since back on Meloxicam.    Diagnostic tests  MRI: Supraspinatus and infraspinatus tendinopathy without tear. Prominent subacromial/subdeltoid fluid consistent with bursitis. Mild acromioclavicular osteoarthritis - prior to surgery    Patient Stated Goals  prevent disability and pain, restore function and ROM,  eliminate pain    Currently in Pain?  Yes    Pain Score  1  up to 6/10    Pain Location  Shoulder    Pain Orientation  Left    Pain Descriptors / Indicators  Sore sharp pain with certain movements    Pain Type  Acute pain;Surgical pain    Pain Frequency  Intermittent         [facility] PT Assessment - 01/23/18 1531      Assessment   Next MD Visit  02/05/18            No data recorded       [facility] Adult PT Treatment/Exercise - 01/23/18 1531      Exercises   Exercises  Shoulder      Shoulder Exercises: Standing   Other Standing Exercises  resisted tricep extension - cable column - 15# x 15      Shoulder Exercises: Therapy Ball   Other Therapy Ball Exercises  Serratus roll-up on orange Pball + isometric ER with looped red TB x10      Shoulder Exercises: ROM/Strengthening   UBE (Upper Arm Bike)  L3 x 6 min (3/3)    Lat Pull  15 reps    Lat Pull Limitations  15#    Wall Pushups  15 reps    Wall Pushups Limitations  + serratus push-up on orange  ball      Manual Therapy   Manual Therapy  Soft tissue mobilization;Myofascial release;Scapular mobilization;Joint mobilization    Manual therapy comments  pt in supine & R sidelying    Joint Mobilization  Grade III AP and inferior joint mobs to L GH joint with distraction    Soft tissue mobilization  STM to L tricpes & posterior shoulder complex - teres, subscap, lats    Myofascial Release  manual TPR to L triceps, teres group & subscapularis    Scapular Mobilization  L scapula - medial glide               PT Short Term Goals - 12/19/17 0935      PT SHORT TERM GOAL #1   Title  patient to be independent with initial HEP    Status  Achieved      PT SHORT TERM GOAL #2   Title  patient to improve L shoulder AROM equal to that of R shoulder without pain limiting    Status  Achieved        PT Long Term Goals - 12/19/17 0936      PT LONG TERM GOAL #1   Title  patient to be independent with advanced HEP    Status   On-going      PT LONG TERM GOAL #2   Title  patient to improve L UE strength to >/= 4+/5 for improved function    Status  On-going      PT LONG TERM GOAL #3   Title  patient to report ability to perform ADLs and light household tasks without pain limiting    Status  On-going      PT LONG TERM GOAL #4   Title  Patient to report pain no greater than 2/10 for greater than 2 weeks    Status  On-going            Plan - 01/23/18 1604    Clinical Impression Statement  Resting pain remains well controlled since resuming meloxicam, but still experiencin sharp pains in posterior and upper-lateral shoulder with random unreproducible movements. Some tightness and ttp persisting in posterior capsule & tricep - addressed with manual therapy with good tolerance. No pain during exercise attempts bur some fatigue noted with serratus strengthening.    PT Treatment/Interventions  ADLs/Self Care Home Management;Cryotherapy;Electrical Stimulation;Iontophoresis 4mg /ml Dexamethasone;Moist Heat;Therapeutic activities;Therapeutic exercise;Ultrasound;Neuromuscular re-education;Patient/family education;Manual techniques;Vasopneumatic Device;Taping;Dry needling;Passive range of motion    Consulted and Agree with Plan of Care  Patient       Patient will benefit from skilled therapeutic intervention in order to improve the following deficits and impairments:  Pain, Impaired UE functional use, Decreased strength, Decreased range of motion, Decreased activity tolerance  Visit Diagnosis: Acute pain of left shoulder  Stiffness of left shoulder, not elsewhere classified  Abnormal posture  Muscle weakness (generalized)  Other symptoms and signs involving the musculoskeletal system     Problem List Patient Active Problem List   Diagnosis Date Noted  . ANKLE SPRAIN, LEFT 04/10/2009  . ACUTE MAXILLARY SINUSITIS 11/06/2008  . URI 11/06/2008    Percival Spanish, PT, MPT 01/23/2018, 4:40 PM  [facility] [address]  [place], [place], [address] Phone: [phone]   Fax:  [phone]  Name: Danielle Braun MRN: [id number] Date of Birth: 07-28-1961

## 2018-01-27 ENCOUNTER — Encounter: Payer: Self-pay | Admitting: Physical Therapy

## 2018-01-27 ENCOUNTER — Ambulatory Visit: Payer: PRIVATE HEALTH INSURANCE | Attending: Orthopedic Surgery | Admitting: Physical Therapy

## 2018-01-27 DIAGNOSIS — M25612 Stiffness of left shoulder, not elsewhere classified: Secondary | ICD-10-CM | POA: Diagnosis present

## 2018-01-27 DIAGNOSIS — M6281 Muscle weakness (generalized): Secondary | ICD-10-CM | POA: Diagnosis present

## 2018-01-27 DIAGNOSIS — M25512 Pain in left shoulder: Secondary | ICD-10-CM | POA: Insufficient documentation

## 2018-01-27 DIAGNOSIS — R293 Abnormal posture: Secondary | ICD-10-CM | POA: Insufficient documentation

## 2018-01-27 DIAGNOSIS — R29898 Other symptoms and signs involving the musculoskeletal system: Secondary | ICD-10-CM | POA: Diagnosis present

## 2018-01-27 MED FILL — raNITIdine HCL 150 MG TABS: 150 | 90 days supply | Qty: 180 | Fill #1

## 2018-01-27 NOTE — Therapy (Signed)
[facility] [address]  [place], [place], [address] Phone: [phone]   Fax:  [phone]  Physical Therapy Treatment  Patient Details  Name: Danielle Braun MRN: [id number] Date of Birth: 28-Nov-1960 Referring Provider: Dr. Tania Ade   Encounter Date: 01/27/2018  PT End of Session - 01/27/18 0755    Visit Number  22    Number of Visits  27    Date for PT Re-Evaluation  01/14/18    Authorization Type  WC - recently approved for 10 more visits    Authorization - Visit Number  50    Authorization - Number of Visits  27    PT Start Time  0752    PT Stop Time  0844    PT Time Calculation (min)  52 min    Activity Tolerance  Patient tolerated treatment well    Behavior During Therapy  [facility] for tasks assessed/performed       Past Medical History:  Diagnosis Date  . Acne   . Arrhythmia   . Bursitis, subacromial    Left  . Endometriosis   . GERD (gastroesophageal reflux disease)   . Sinusitis     Past Surgical History:  Procedure Laterality Date  . ABDOMINAL HYSTERECTOMY    . ABDOMINAL SURGERY     csection  . DILATION AND CURETTAGE OF UTERUS    . SHOULDER ARTHROSCOPY WITH SUBACROMIAL DECOMPRESSION Left 10/31/2017   Procedure: SHOULDER ARTHROSCOPY WITH BURSECTOMY AND SUBACROMIAL DECOMPRESSION;  Surgeon: Tania Ade, MD;  Location: [place];  Service: Orthopedics;  Laterality: Left;  Left shoulder arthroscopy bursectomy and subacromial decompression  . TONSILLECTOMY      There were no vitals filed for this visit.  Subjective Assessment - 01/27/18 0753    Subjective  feeling well - no issues over the weekend    Diagnostic tests  MRI: Supraspinatus and infraspinatus tendinopathy without tear. Prominent subacromial/subdeltoid fluid consistent with bursitis. Mild acromioclavicular osteoarthritis - prior to surgery    Patient Stated Goals  prevent disability and pain, restore function and ROM, eliminate  pain    Currently in Pain?  Yes    Pain Score  1  0-1 resting pain; sharp pains up to 4/10    Pain Location  Shoulder    Pain Orientation  Left    Pain Descriptors / Indicators  Sore intermittent sharp pains    Pain Type  Acute pain;Surgical pain                No data recorded       [facility] Adult PT Treatment/Exercise - 01/27/18 0756      Shoulder Exercises: Prone   Extension  Strengthening;Both;15 reps;Weights;Limitations    Extension Weight (lbs)  2    Extension Limitations  prone "I's" over green pball    Horizontal ABduction 1  Strengthening;Both;15 reps;Weights;Limitations    Horizontal ABduction 1 Weight (lbs)  2    Horizontal ABduction 1 Limitations  prone "T's" over green pball    Horizontal ABduction 2  Strengthening;Both;15 reps;Weights;Limitations    Horizontal ABduction 2 Weight (lbs)  1    Horizontal ABduction 2 Limitations  prone "Y's" over green pball      Shoulder Exercises: Standing   ABduction  Strengthening;Left;10 reps 4# at wrist - at wall    Row  Strengthening;Both;15 reps TRX    Other Standing Exercises  alternating overhead press - 3# x 15    Other Standing Exercises  resisted  wall clocks - red tband - B UE x 12      Shoulder Exercises: ROM/Strengthening   UBE (Upper Arm Bike)  L2.5 x 6 min (3/3)    Modified Plank  30 seconds;4 reps    Ball on Wall  4# at L wrist - walking orange pball up wall x 15    Rhythmic Stabilization, Seated  standing - red medball - L UE - CW x 15, CCW x 15, A-Z    Other ROM/Strengthening Exercises  BATCA - resisted elbow extension - 15# x 15      Modalities   Modalities  Cryotherapy      Cryotherapy   Number Minutes Cryotherapy  10 Minutes    Cryotherapy Location  Shoulder    Type of Cryotherapy  Ice pack               PT Short Term Goals - 12/19/17 0935      PT SHORT TERM GOAL #1   Title  patient to be independent with initial HEP    Status  Achieved      PT SHORT TERM GOAL #2   Title   patient to improve L shoulder AROM equal to that of R shoulder without pain limiting    Status  Achieved        PT Long Term Goals - 12/19/17 0936      PT LONG TERM GOAL #1   Title  patient to be independent with advanced HEP    Status  On-going      PT LONG TERM GOAL #2   Title  patient to improve L UE strength to >/= 4+/5 for improved function    Status  On-going      PT LONG TERM GOAL #3   Title  patient to report ability to perform ADLs and light household tasks without pain limiting    Status  On-going      PT LONG TERM GOAL #4   Title  Patient to report pain no greater than 2/10 for greater than 2 weeks    Status  On-going            Plan - 01/27/18 0755    Clinical Impression Statement  PT session today focusing heavily on strengthening L UE - in overhead and at chest level with good tolerance. Patient only noting fatigue at L shoulder as her primary limiting factor as pain and discomfort is now well controlled since resuming Meloxicam. Will continue to progress overhead strengthening at upcoming visits.     PT Treatment/Interventions  ADLs/Self Care Home Management;Cryotherapy;Electrical Stimulation;Iontophoresis 4mg /ml Dexamethasone;Moist Heat;Therapeutic activities;Therapeutic exercise;Ultrasound;Neuromuscular re-education;Patient/family education;Manual techniques;Vasopneumatic Device;Taping;Dry needling;Passive range of motion    Consulted and Agree with Plan of Care  Patient       Patient will benefit from skilled therapeutic intervention in order to improve the following deficits and impairments:  Pain, Impaired UE functional use, Decreased strength, Decreased range of motion, Decreased activity tolerance  Visit Diagnosis: Acute pain of left shoulder  Stiffness of left shoulder, not elsewhere classified  Abnormal posture  Muscle weakness (generalized)  Other symptoms and signs involving the musculoskeletal system     Problem List Patient Active  Problem List   Diagnosis Date Noted  . ANKLE SPRAIN, LEFT 04/10/2009  . ACUTE MAXILLARY SINUSITIS 11/06/2008  . URI 11/06/2008    Lanney Gins, PT, DPT 01/27/18 9:18 AM    [facility]  [place], [place], [address] Phone: [phone]   Fax:  [phone]  Name: Danielle Braun MRN: [id number] Date of Birth: 12-19-1960

## 2018-01-30 ENCOUNTER — Encounter: Payer: Self-pay | Admitting: Physical Therapy

## 2018-01-30 ENCOUNTER — Ambulatory Visit: Payer: PRIVATE HEALTH INSURANCE | Admitting: Physical Therapy

## 2018-01-30 DIAGNOSIS — R293 Abnormal posture: Secondary | ICD-10-CM

## 2018-01-30 DIAGNOSIS — M6281 Muscle weakness (generalized): Secondary | ICD-10-CM

## 2018-01-30 DIAGNOSIS — R29898 Other symptoms and signs involving the musculoskeletal system: Secondary | ICD-10-CM

## 2018-01-30 DIAGNOSIS — M25612 Stiffness of left shoulder, not elsewhere classified: Secondary | ICD-10-CM

## 2018-01-30 DIAGNOSIS — M25512 Pain in left shoulder: Secondary | ICD-10-CM | POA: Diagnosis not present

## 2018-01-30 NOTE — Therapy (Signed)
[name] [address]  [place], [place], [address] Phone: [phone]   Fax:  [phone]  Physical Therapy Treatment  Patient Details  Name: Danielle Braun MRN: [id number] Date of Birth: 05/20/1961 Referring Provider: Dr. Tania Ade   Encounter Date: 01/30/2018  Danielle Braun End of Session - 01/30/18 0844    Visit Number  23    Number of Visits  27    Date for Danielle Braun Re-Evaluation  01/14/18    Authorization Type  WC - recently approved for 10 more visits    Authorization - Visit Number  23    Authorization - Number of Visits  27    Danielle Braun Start Time  0841    Danielle Braun Stop Time  0935 ice pack at end of session    Danielle Braun Time Calculation (min)  54 min    Activity Tolerance  Patient tolerated treatment well    Behavior During Therapy  [facility] for tasks assessed/performed       Past Medical History:  Diagnosis Date  . Acne   . Arrhythmia   . Bursitis, subacromial    Left  . Endometriosis   . GERD (gastroesophageal reflux disease)   . Sinusitis     Past Surgical History:  Procedure Laterality Date  . ABDOMINAL HYSTERECTOMY    . ABDOMINAL SURGERY     csection  . DILATION AND CURETTAGE OF UTERUS    . SHOULDER ARTHROSCOPY WITH SUBACROMIAL DECOMPRESSION Left 10/31/2017   Procedure: SHOULDER ARTHROSCOPY WITH BURSECTOMY AND SUBACROMIAL DECOMPRESSION;  Surgeon: Tania Ade, MD;  Location: [place];  Service: Orthopedics;  Laterality: Left;  Left shoulder arthroscopy bursectomy and subacromial decompression  . TONSILLECTOMY      There were no vitals filed for this visit.  Subjective Assessment - 01/30/18 0841    Subjective  has been hurting a little more from Tuesday on - yesterday had pain all over shoulder    Diagnostic tests  MRI: Supraspinatus and infraspinatus tendinopathy without tear. Prominent subacromial/subdeltoid fluid consistent with bursitis. Mild acromioclavicular osteoarthritis - prior to surgery    Patient Stated  Goals  prevent disability and pain, restore function and ROM, eliminate pain    Currently in Pain?  Yes    Pain Score  3     Pain Location  Shoulder    Pain Orientation  Left    Pain Descriptors / Indicators  -- deep ache in humerus    Pain Type  Acute pain;Surgical pain                       [facility] Adult Danielle Braun Treatment/Exercise - 01/30/18 0845      Shoulder Exercises: Seated   Other Seated Exercises  1/2 kneeling single arm row - 5# - cable column      Shoulder Exercises: Standing   External Rotation  Strengthening;Left;15 reps cable column - 5# - eccentric    Flexion  Strengthening;Both;15 reps;Theraband overhead press    Theraband Level (Shoulder Flexion)  Level 1 (Yellow)    Row  Strengthening;Both;15 reps TRX    Other Standing Exercises  lifting floor to counter - box + 5# - heavy L UT compensation - reduced compensation with removal of 5#    Other Standing Exercises  B UE holding single 7# weight - cabinet reaches x 15 - no UT compensation      Shoulder Exercises: ROM/Strengthening   UBE (Upper Arm Bike)  L3 x 6 min (3/3)  Rhythmic Stabilization, Seated  standing - red medball - L UE - CW x 15, CCW x 15, A-Z      Modalities   Modalities  Cryotherapy      Cryotherapy   Number Minutes Cryotherapy  10 Minutes    Cryotherapy Location  Shoulder    Type of Cryotherapy  Ice pack      Manual Therapy   Manual Therapy  Soft tissue mobilization;Myofascial release    Manual therapy comments  patient in R sidelying    Soft tissue mobilization  STM to L shoulder complex     Myofascial Release  manual trigger point release to L rhomboid, L infra/teres               Danielle Braun Short Term Goals - 12/19/17 0935      Danielle Braun SHORT TERM GOAL #1   Title  patient to be independent with initial HEP    Status  Achieved      Danielle Braun SHORT TERM GOAL #2   Title  patient to improve L shoulder AROM equal to that of R shoulder without pain limiting    Status  Achieved        Danielle Braun  Long Term Goals - 12/19/17 0936      Danielle Braun LONG TERM GOAL #1   Title  patient to be independent with advanced HEP    Status  On-going      Danielle Braun LONG TERM GOAL #2   Title  patient to improve L UE strength to >/= 4+/5 for improved function    Status  On-going      Danielle Braun LONG TERM GOAL #3   Title  patient to report ability to perform ADLs and light household tasks without pain limiting    Status  On-going      Danielle Braun LONG TERM GOAL #4   Title  Patient to report pain no greater than 2/10 for greater than 2 weeks    Status  On-going            Plan - 01/30/18 0844    Clinical Impression Statement  Danielle Braun reporting some increase in pain since Tuesday wiht no known reason. Danielle Braun session today focusing on lifting from floor as well as lifting overhead. Visible UT compensation with lift from floor - however with weight slightly reduced no compensation. B overhead lifting with no compensation, but with aptient reporting heavy UT involvement with single arm overhead at home. Will continue to progress this.     Danielle Braun Treatment/Interventions  ADLs/Self Care Home Management;Cryotherapy;Electrical Stimulation;Iontophoresis 4mg /ml Dexamethasone;Moist Heat;Therapeutic activities;Therapeutic exercise;Ultrasound;Neuromuscular re-education;Patient/family education;Manual techniques;Vasopneumatic Device;Taping;Dry needling;Passive range of motion    Consulted and Agree with Plan of Care  Patient       Patient will benefit from skilled therapeutic intervention in order to improve the following deficits and impairments:  Pain, Impaired UE functional use, Decreased strength, Decreased range of motion, Decreased activity tolerance  Visit Diagnosis: Acute pain of left shoulder  Stiffness of left shoulder, not elsewhere classified  Abnormal posture  Muscle weakness (generalized)  Other symptoms and signs involving the musculoskeletal system     Problem List Patient Active Problem List   Diagnosis Date Noted  .  ANKLE SPRAIN, LEFT 04/10/2009  . ACUTE MAXILLARY SINUSITIS 11/06/2008  . URI 11/06/2008     Danielle Braun, Danielle Braun, Danielle Braun 01/30/18 10:31 AM   [facility] [place], [place], [address] Phone: [phone]   Fax:  [phone]  Name: Danielle Braun MRN: [id number] Date of Birth: 08/27/1961

## 2018-02-03 ENCOUNTER — Ambulatory Visit: Payer: PRIVATE HEALTH INSURANCE | Admitting: Physical Therapy

## 2018-02-03 ENCOUNTER — Encounter: Payer: Self-pay | Admitting: Physical Therapy

## 2018-02-03 DIAGNOSIS — M25512 Pain in left shoulder: Secondary | ICD-10-CM | POA: Diagnosis not present

## 2018-02-03 DIAGNOSIS — R293 Abnormal posture: Secondary | ICD-10-CM

## 2018-02-03 DIAGNOSIS — M25612 Stiffness of left shoulder, not elsewhere classified: Secondary | ICD-10-CM

## 2018-02-03 DIAGNOSIS — M6281 Muscle weakness (generalized): Secondary | ICD-10-CM

## 2018-02-03 DIAGNOSIS — R29898 Other symptoms and signs involving the musculoskeletal system: Secondary | ICD-10-CM

## 2018-02-03 NOTE — Therapy (Signed)
[facility] [address]  [place], [place], [address] Phone: [phone]   Fax:  [phone]  Physical Therapy Treatment  Patient Details  Name: Danielle Braun MRN: [id number] Date of Birth: [birth year] Referring Provider: Dr. Tania Ade   Encounter Date: 02/03/2018  PT End of Session - 02/03/18 0758    Visit Number  24    Number of Visits  27    Date for PT Re-Evaluation  01/14/18    Authorization Type  WC - recently approved for 10 more visits    Authorization - Visit Number  24    Authorization - Number of Visits  27    PT Start Time  0752    PT Stop Time  0845 ice pack    PT Time Calculation (min)  53 min    Activity Tolerance  Patient tolerated treatment well    Behavior During Therapy  [facility] for tasks assessed/performed       Past Medical History:  Diagnosis Date  . Acne   . Arrhythmia   . Bursitis, subacromial    Left  . Endometriosis   . GERD (gastroesophageal reflux disease)   . Sinusitis     Past Surgical History:  Procedure Laterality Date  . ABDOMINAL HYSTERECTOMY    . ABDOMINAL SURGERY     csection  . DILATION AND CURETTAGE OF UTERUS    . SHOULDER ARTHROSCOPY WITH SUBACROMIAL DECOMPRESSION Left 10/31/2017   Procedure: SHOULDER ARTHROSCOPY WITH BURSECTOMY AND SUBACROMIAL DECOMPRESSION;  Surgeon: Tania Ade, MD;  Location: [place];  Service: Orthopedics;  Laterality: Left;  Left shoulder arthroscopy bursectomy and subacromial decompression  . TONSILLECTOMY      There were no vitals filed for this visit.  Subjective Assessment - 02/03/18 0756    Subjective  last night - up at 330 due to pain - had to take tylenol    Diagnostic tests  MRI: Supraspinatus and infraspinatus tendinopathy without tear. Prominent subacromial/subdeltoid fluid consistent with bursitis. Mild acromioclavicular osteoarthritis - prior to surgery    Patient Stated Goals  prevent disability and pain, restore  function and ROM, eliminate pain    Currently in Pain?  Yes    Pain Score  4     Pain Location  Shoulder    Pain Orientation  Left    Pain Descriptors / Indicators  Aching    Pain Type  Acute pain;Surgical pain                       [facility] Adult PT Treatment/Exercise - 02/03/18 0759      Shoulder Exercises: Standing   Other Standing Exercises  squat to lift - to lift overhead - 8# x 10    Other Standing Exercises  resisted flexion, scaption, abduction - yellow tband x 10 reps each direction      Shoulder Exercises: ROM/Strengthening   UBE (Upper Arm Bike)  L3 x 6 min (3/3)    Ball on Wall  overhead ball taps - 3 x 30 seconds      Shoulder Exercises: Body Blade   Flexion  -- 10 reps - 90 deg flexion to overhead - L UE only      Modalities   Modalities  Cryotherapy      Cryotherapy   Number Minutes Cryotherapy  10 Minutes    Cryotherapy Location  Shoulder    Type of Cryotherapy  Ice pack      Manual  Therapy   Manual Therapy  Soft tissue mobilization;Myofascial release    Manual therapy comments  patient prone    Soft tissue mobilization  STM to L posterior lateral scapular mm, L UT, L thoracic paraspinals, L infraspinatus    Myofascial Release  manual trigger point release to L infra, L lats/teres group       Trigger Point Dry Needling - 02/03/18 1114    Consent Given?  Yes    Muscles Treated Upper Body  Upper trapezius;Infraspinatus lats/teres    Upper Trapezius Response  Twitch reponse elicited;Palpable increased muscle length    Infraspinatus Response  Twitch response elicited;Palpable increased muscle length             PT Short Term Goals - 12/19/17 0935      PT SHORT TERM GOAL #1   Title  patient to be independent with initial HEP    Status  Achieved      PT SHORT TERM GOAL #2   Title  patient to improve L shoulder AROM equal to that of R shoulder without pain limiting    Status  Achieved        PT Long Term Goals - 12/19/17 0936       PT LONG TERM GOAL #1   Title  patient to be independent with advanced HEP    Status  On-going      PT LONG TERM GOAL #2   Title  patient to improve L UE strength to >/= 4+/5 for improved function    Status  On-going      PT LONG TERM GOAL #3   Title  patient to report ability to perform ADLs and light household tasks without pain limiting    Status  On-going      PT LONG TERM GOAL #4   Title  Patient to report pain no greater than 2/10 for greater than 2 weeks    Status  On-going            Plan - 02/03/18 0759    Clinical Impression Statement  [name] today reporting continued pain flare-ups with no known precipitating event or factor. Patient with continued use of meloxicam and other OTC medication to control pain. PT session today focusing on STM at posterior shoulder complex as well as functional activites such as lifting overhead and performing overhead activities, as her strength continues to progress well. Patient to see MD this week for follow-up.     PT Treatment/Interventions  ADLs/Self Care Home Management;Cryotherapy;Electrical Stimulation;Iontophoresis 4mg /ml Dexamethasone;Moist Heat;Therapeutic activities;Therapeutic exercise;Ultrasound;Neuromuscular re-education;Patient/family education;Manual techniques;Vasopneumatic Device;Taping;Dry needling;Passive range of motion    Consulted and Agree with Plan of Care  Patient       Patient will benefit from skilled therapeutic intervention in order to improve the following deficits and impairments:  Pain, Impaired UE functional use, Decreased strength, Decreased range of motion, Decreased activity tolerance  Visit Diagnosis: Acute pain of left shoulder  Stiffness of left shoulder, not elsewhere classified  Abnormal posture  Muscle weakness (generalized)  Other symptoms and signs involving the musculoskeletal system     Problem List Patient Active Problem List   Diagnosis Date Noted  . ANKLE SPRAIN, LEFT  04/10/2009  . ACUTE MAXILLARY SINUSITIS 11/06/2008  . URI 11/06/2008     Lanney Gins, PT, DPT 02/03/18 11:23 AM   [facility] [address]  [place], [place], [address] Phone: [phone]   Fax:  [phone]  Name: Danielle  ANTWONETTE Braun MRN: [id number] Date of Birth: [birth year]

## 2018-02-05 MED FILL — traMADol HCL 50 MG TABS: 50 | 10 days supply | Qty: 10 | Fill #0

## 2018-02-05 MED FILL — MELOXICAM 7.5 MG TABLET: 7.5 | 30 days supply | Qty: 30 | Fill #0

## 2018-02-06 ENCOUNTER — Ambulatory Visit: Payer: PRIVATE HEALTH INSURANCE | Admitting: Physical Therapy

## 2018-02-06 ENCOUNTER — Encounter: Payer: Self-pay | Admitting: Physical Therapy

## 2018-02-06 DIAGNOSIS — M25512 Pain in left shoulder: Secondary | ICD-10-CM | POA: Diagnosis not present

## 2018-02-06 DIAGNOSIS — R293 Abnormal posture: Secondary | ICD-10-CM

## 2018-02-06 DIAGNOSIS — M6281 Muscle weakness (generalized): Secondary | ICD-10-CM

## 2018-02-06 DIAGNOSIS — R29898 Other symptoms and signs involving the musculoskeletal system: Secondary | ICD-10-CM

## 2018-02-06 DIAGNOSIS — M25612 Stiffness of left shoulder, not elsewhere classified: Secondary | ICD-10-CM

## 2018-02-06 NOTE — Therapy (Signed)
[facility] [address]  [place], [place], [address] Phone: [phone]   Fax:  [phone]  Physical Therapy Treatment  Patient Details  Name: Danielle Braun MRN: [id number] Date of Birth: 1961-06-22 Referring Provider: Dr. Tania Ade   Encounter Date: 02/06/2018  PT End of Session - 02/06/18 0851    Visit Number  25    Number of Visits  27    Date for PT Re-Evaluation  01/14/18    Authorization Type  WC - recently approved for 10 more visits    Authorization - Visit Number  25    Authorization - Number of Visits  27    PT Start Time  0848    PT Stop Time  0938 ice pack    PT Time Calculation (min)  50 min    Activity Tolerance  Patient tolerated treatment well    Behavior During Therapy  [facility] for tasks assessed/performed       Past Medical History:  Diagnosis Date  . Acne   . Arrhythmia   . Bursitis, subacromial    Left  . Endometriosis   . GERD (gastroesophageal reflux disease)   . Sinusitis     Past Surgical History:  Procedure Laterality Date  . ABDOMINAL HYSTERECTOMY    . ABDOMINAL SURGERY     csection  . DILATION AND CURETTAGE OF UTERUS    . SHOULDER ARTHROSCOPY WITH SUBACROMIAL DECOMPRESSION Left 10/31/2017   Procedure: SHOULDER ARTHROSCOPY WITH BURSECTOMY AND SUBACROMIAL DECOMPRESSION;  Surgeon: Tania Ade, MD;  Location: [place];  Service: Orthopedics;  Laterality: Left;  Left shoulder arthroscopy bursectomy and subacromial decompression  . TONSILLECTOMY      There were no vitals filed for this visit.  Subjective Assessment - 02/06/18 0850    Subjective  seen MD - finish up current POC - take Meloxicam as needed    Diagnostic tests  MRI: Supraspinatus and infraspinatus tendinopathy without tear. Prominent subacromial/subdeltoid fluid consistent with bursitis. Mild acromioclavicular osteoarthritis - prior to surgery    Patient Stated Goals  prevent disability and pain, restore  function and ROM, eliminate pain    Currently in Pain?  Yes    Pain Score  2     Pain Location  Shoulder    Pain Orientation  Left    Pain Descriptors / Indicators  Sore;Aching    Pain Type  Acute pain;Surgical pain                       [facility] Adult PT Treatment/Exercise - 02/06/18 0853      Shoulder Exercises: Prone   Extension  Strengthening;Both;15 reps;Weights;Limitations    Extension Weight (lbs)  2    Extension Limitations  prone "I's" over green pball    Horizontal ABduction 1  Strengthening;Both;15 reps;Weights;Limitations    Horizontal ABduction 1 Weight (lbs)  2    Horizontal ABduction 1 Limitations  prone "T's" over green pball    Horizontal ABduction 2  Strengthening;Both;15 reps;Weights;Limitations    Horizontal ABduction 2 Weight (lbs)  2    Horizontal ABduction 2 Limitations  prone "Y's" over green pball      Shoulder Exercises: Standing   Row  Strengthening;Both;15 reps TRX    Other Standing Exercises  PNF D1/D2 flexion/extension - red tband x 10      Shoulder Exercises: Therapy Ball   Other Therapy Ball Exercises  serratus rolls on foam roller + red tband at wrist x  15      Shoulder Exercises: ROM/Strengthening   UBE (Upper Arm Bike)  L3 x 6 min (3/3)    Rhythmic Stabilization, Seated  standing - red medball + 1# wrist weight - L UE - CW x 15, CCW x 15, A-Z     Other ROM/Strengthening Exercises  BATCA - serratus punch - 20# 2 x 15    Other ROM/Strengthening Exercises  resisted wall clocks - red tband x 10 each side      Modalities   Modalities  Cryotherapy      Cryotherapy   Number Minutes Cryotherapy  10 Minutes    Cryotherapy Location  Shoulder    Type of Cryotherapy  Ice pack               PT Short Term Goals - 12/19/17 0935      PT SHORT TERM GOAL #1   Title  patient to be independent with initial HEP    Status  Achieved      PT SHORT TERM GOAL #2   Title  patient to improve L shoulder AROM equal to that of R shoulder  without pain limiting    Status  Achieved        PT Long Term Goals - 12/19/17 0936      PT LONG TERM GOAL #1   Title  patient to be independent with advanced HEP    Status  On-going      PT LONG TERM GOAL #2   Title  patient to improve L UE strength to >/= 4+/5 for improved function    Status  On-going      PT LONG TERM GOAL #3   Title  patient to report ability to perform ADLs and light household tasks without pain limiting    Status  On-going      PT LONG TERM GOAL #4   Title  Patient to report pain no greater than 2/10 for greater than 2 weeks    Status  On-going            Plan - 02/06/18 2355    Clinical Impression Statement  Cindy seen by MD - complete current POC as well as meloxicam PRN for pain. Patient ha smade excellent progress with PT intervention regarding AROM and strengthening, with good tolerance to all ther ex in todays session. Will plan to review comprehensive HEP at next visit and plan for d/c.     PT Treatment/Interventions  ADLs/Self Care Home Management;Cryotherapy;Electrical Stimulation;Iontophoresis 4mg /ml Dexamethasone;Moist Heat;Therapeutic activities;Therapeutic exercise;Ultrasound;Neuromuscular re-education;Patient/family education;Manual techniques;Vasopneumatic Device;Taping;Dry needling;Passive range of motion    Consulted and Agree with Plan of Care  Patient       Patient will benefit from skilled therapeutic intervention in order to improve the following deficits and impairments:  Pain, Impaired UE functional use, Decreased strength, Decreased range of motion, Decreased activity tolerance  Visit Diagnosis: Acute pain of left shoulder  Stiffness of left shoulder, not elsewhere classified  Abnormal posture  Muscle weakness (generalized)  Other symptoms and signs involving the musculoskeletal system     Problem List Patient Active Problem List   Diagnosis Date Noted  . ANKLE SPRAIN, LEFT 04/10/2009  . ACUTE MAXILLARY SINUSITIS  11/06/2008  . URI 11/06/2008     Lanney Gins, PT, DPT 02/06/18 11:25 AM   [facility] [address]  [place], [place], [address] Phone: [phone]   Fax:  [phone]  Name: Danielle Braun  MRN: [id number] Date of Birth: 1961/09/23

## 2018-02-10 ENCOUNTER — Encounter: Payer: Self-pay | Admitting: Physical Therapy

## 2018-02-10 ENCOUNTER — Ambulatory Visit: Payer: PRIVATE HEALTH INSURANCE | Admitting: Physical Therapy

## 2018-02-10 DIAGNOSIS — M25512 Pain in left shoulder: Secondary | ICD-10-CM | POA: Diagnosis not present

## 2018-02-10 DIAGNOSIS — R293 Abnormal posture: Secondary | ICD-10-CM

## 2018-02-10 DIAGNOSIS — R29898 Other symptoms and signs involving the musculoskeletal system: Secondary | ICD-10-CM

## 2018-02-10 DIAGNOSIS — M6281 Muscle weakness (generalized): Secondary | ICD-10-CM

## 2018-02-10 DIAGNOSIS — M25612 Stiffness of left shoulder, not elsewhere classified: Secondary | ICD-10-CM

## 2018-02-10 NOTE — Patient Instructions (Signed)
SHOULDER: Internal Rotation, 90 Degrees Abduction (Band)   Support arm on table, elbow bent 90, forearm up. Pull forearm forward, keep elbow bent.  Use ___red_____ band. __15_ reps per set. **NO TABLE**  SHOULDER: External Rotation, 90 Degrees Abduction (Band)   Support arm on table, elbow bent 90, forearm down. Hold band, pull forearm up, keeping elbow bent. Use __red_____ band. _15__ reps per set. **NO TABLE**  X to V Yellow tband - start in opposite pockets, rotate and lift x 15 reps  Resistive Band Rowing   With resistive band anchored in door, grasp both ends. Keeping elbows bent, pull back, squeezing shoulder blades together. Hold __5__ seconds. Repeat __15__ times.   Shoulder Extensors   Sit on ball or firm surface, or stand with tubing or ____ pound weight in right hand. Lift arm back and up, elbow straight. Keep head and back straight. Hold __3__ seconds. Repeat __15__ times.   Elbow Extension: Resisted   Sit in chair with resistive band secured at armrest and right elbow bent. Straighten elbow. Repeat _15___ times per set.  **stand with band over top of door, keeping arm by side**  Wall Push-Up: Double Arm   Stand ___ feet from wall with both hands on wall. Perform a push-up. Repeat _15__ times per set.   (Home) PNF: D1 Extension - Unilateral   Opposite side toward anchor, right arm up, across body, thumb up, pull arm down across body, rotating to thumb down. Follow hand with head and eyes. Repeat __15_ times per set.  (Home) PNF: D1 Flexion - Unilateral   Left side toward anchor, arm down and out to side, thumb up, pull arm up and across body, rotating arm to thumb down. Follow hand with head and eyes. Repeat ___15_ times per set.  (Clinic) PNF: D2 Extension - Unilateral   Left side toward pulley, arm up and out to side, thumb up, pull arm down across body, rotating to thumb down. Follow hand with head and eyes. Repeat __15__ times per set.      (Clinic) PNF: D2 Flexion - Unilateral   Opposite side toward pulley, right arm down, across body, thumb down, pull arm up and out, rotating to thumb up. Follow hand with head and eyes. Repeat __15__ times per set.   Extension: "I" (Eccentric) - Prone (Ball)   Lie over ball or table. Lift arms toward hips. Slowly lower. Keep head in line with spine. _15__ reps per set. 2 lbs.  Scapular Retraction: "T" (Eccentric) - Prone (Ball)   Lie over ball or table. Lift arms into "T", thumbs up. Squeeze shoulder blades. Slowly lower. Keep head in line with spine. _15__ reps per set. 2 lbs.  Scapular Depression: "Y" (Eccentric) - Prone (Ball)   Lie over ball or table. Lift arms forward into "Y", thumbs up. Squeeze shoulder blades. Slowly lower. Keep head in line with spine. __15_ reps per set. 2 lbs.

## 2018-02-10 NOTE — Therapy (Signed)
[facility] [address]  [place], [place], [address] Phone: [phone]   Fax:  [phone]  Physical Therapy Treatment  Patient Details  Name: Danielle Braun MRN: [id number] Date of Birth: Jan 23, 1961 Referring Provider: Dr. Tania Ade   Encounter Date: 02/10/2018  PT End of Session - 02/10/18 0756    Visit Number  26    Number of Visits  27    Authorization Type  WC - recently approved for 10 more visits    Authorization - Visit Number  26    Authorization - Number of Visits  27    PT Start Time  0753    PT Stop Time  0835    PT Time Calculation (min)  42 min    Activity Tolerance  Patient tolerated treatment well    Behavior During Therapy  [facility] for tasks assessed/performed       Past Medical History:  Diagnosis Date  . Acne   . Arrhythmia   . Bursitis, subacromial    Left  . Endometriosis   . GERD (gastroesophageal reflux disease)   . Sinusitis     Past Surgical History:  Procedure Laterality Date  . ABDOMINAL HYSTERECTOMY    . ABDOMINAL SURGERY     csection  . DILATION AND CURETTAGE OF UTERUS    . SHOULDER ARTHROSCOPY WITH SUBACROMIAL DECOMPRESSION Left 10/31/2017   Procedure: SHOULDER ARTHROSCOPY WITH BURSECTOMY AND SUBACROMIAL DECOMPRESSION;  Surgeon: Tania Ade, MD;  Location: [place];  Service: Orthopedics;  Laterality: Left;  Left shoulder arthroscopy bursectomy and subacromial decompression  . TONSILLECTOMY      There were no vitals filed for this visit.  Subjective Assessment - 02/10/18 0756    Subjective  doing well - slight pain over the weekend    Diagnostic tests  MRI: Supraspinatus and infraspinatus tendinopathy without tear. Prominent subacromial/subdeltoid fluid consistent with bursitis. Mild acromioclavicular osteoarthritis - prior to surgery    Patient Stated Goals  prevent disability and pain, restore function and ROM, eliminate pain    Currently in Pain?  Yes    Pain  Score  2     Pain Location  Shoulder    Pain Orientation  Left    Pain Descriptors / Indicators  Sore    Pain Type  Acute pain;Surgical pain                       [facility] Adult PT Treatment/Exercise - 02/10/18 0001      Shoulder Exercises: Prone   Retraction Weight (lbs)  2    Extension  Strengthening;Both;15 reps;Weights;Limitations    Extension Weight (lbs)  2    Extension Limitations  prone "I's" over green pball    Horizontal ABduction 1  Strengthening;Both;15 reps;Weights;Limitations    Horizontal ABduction 1 Weight (lbs)  2    Horizontal ABduction 1 Limitations  prone "T's" over green pball    Horizontal ABduction 2  Strengthening;Both;15 reps;Weights;Limitations    Horizontal ABduction 2 Weight (lbs)  2    Horizontal ABduction 2 Limitations  prone "Y's" over green pball      Shoulder Exercises: Standing   External Rotation  Strengthening;Left;15 reps;Theraband 90/90    Theraband Level (Shoulder External Rotation)  Level 2 (Red)    Internal Rotation  Strengthening;Left;15 reps;Theraband 90/90    Theraband Level (Shoulder Internal Rotation)  Level 2 (Red)    Extension  Strengthening;Both;15 reps;Theraband    Theraband Level (Shoulder Extension)  Level 3 (Green)    Row  Strengthening;Both;15 reps    Theraband Level (Shoulder Row)  Level 3 (Green)    Other Standing Exercises  PNF D1/D2 flexion/extension - red tband x 10      Shoulder Exercises: ROM/Strengthening   UBE (Upper Arm Bike)  L3 x 6 min (3/3)    Wall Pushups  15 reps    Other ROM/Strengthening Exercises  resisted tricep extension - green tband x 15 reps               PT Short Term Goals - 12/19/17 0935      PT SHORT TERM GOAL #1   Title  patient to be independent with initial HEP    Status  Achieved      PT SHORT TERM GOAL #2   Title  patient to improve L shoulder AROM equal to that of R shoulder without pain limiting    Status  Achieved        PT Long Term Goals - 02/10/18 1001       PT LONG TERM GOAL #1   Title  patient to be independent with advanced HEP    Status  Achieved      PT LONG TERM GOAL #2   Title  patient to improve L UE strength to >/= 4+/5 for improved function    Status  Achieved      PT LONG TERM GOAL #3   Title  patient to report ability to perform ADLs and light household tasks without pain limiting    Status  Achieved      PT LONG TERM GOAL #4   Title  Patient to report pain no greater than 2/10 for greater than 2 weeks    Status  Partially Met            Plan - 02/10/18 0935    Clinical Impression Statement  Cindy presenting to PT today for last vist - plan to wrap up POC today as well as review comprehensive HEP. HEP including all high level strengtheing work with red/green tband with no issue. Continued education also on continued stretching and self massage for full benefit/carryover. Patient meeting all goals other than pain goal, which is likely met, however, some pain increases above 2/10 intermittently throughout day. Patient to be d/c from PT today - with good understanding of all HEP for further functional progression.     PT Treatment/Interventions  ADLs/Self Care Home Management;Cryotherapy;Electrical Stimulation;Iontophoresis 5m/ml Dexamethasone;Moist Heat;Therapeutic activities;Therapeutic exercise;Ultrasound;Neuromuscular re-education;Patient/family education;Manual techniques;Vasopneumatic Device;Taping;Dry needling;Passive range of motion    Consulted and Agree with Plan of Care  Patient       Patient will benefit from skilled therapeutic intervention in order to improve the following deficits and impairments:  Pain, Impaired UE functional use, Decreased strength, Decreased range of motion, Decreased activity tolerance  Visit Diagnosis: Acute pain of left shoulder  Stiffness of left shoulder, not elsewhere classified  Abnormal posture  Muscle weakness (generalized)  Other symptoms and signs involving the  musculoskeletal system     Problem List Patient Active Problem List   Diagnosis Date Noted  . ANKLE SPRAIN, LEFT 04/10/2009  . ACUTE MAXILLARY SINUSITIS 11/06/2008  . URI 11/06/2008     SLanney Gins PT, DPT 02/10/18 11:12 AM   [facility] [place] [address]: [phone]  Fax:  [phone] Name: CPARIS HOHNMRN: [id number] of Birth: 1June 09, 1962

## 2018-02-17 MED FILL — VENLAFAXINE HCL ER 37.5 MG: 37.5 | 30 days supply | Qty: 60 | Fill #1

## 2018-02-18 DIAGNOSIS — Z01419 Encounter for gynecological examination (general) (routine) without abnormal findings: Secondary | ICD-10-CM | POA: Diagnosis not present

## 2018-02-18 DIAGNOSIS — Z13820 Encounter for screening for osteoporosis: Secondary | ICD-10-CM | POA: Diagnosis not present

## 2018-02-18 DIAGNOSIS — Z6826 Body mass index (BMI) 26.0-26.9, adult: Secondary | ICD-10-CM | POA: Diagnosis not present

## 2018-03-13 DIAGNOSIS — D224 Melanocytic nevi of scalp and neck: Secondary | ICD-10-CM | POA: Diagnosis not present

## 2018-03-13 DIAGNOSIS — L821 Other seborrheic keratosis: Secondary | ICD-10-CM | POA: Diagnosis not present

## 2018-03-13 DIAGNOSIS — L738 Other specified follicular disorders: Secondary | ICD-10-CM | POA: Diagnosis not present

## 2018-03-13 DIAGNOSIS — D2239 Melanocytic nevi of other parts of face: Secondary | ICD-10-CM | POA: Diagnosis not present

## 2018-03-13 DIAGNOSIS — D2262 Melanocytic nevi of left upper limb, including shoulder: Secondary | ICD-10-CM | POA: Diagnosis not present

## 2018-03-13 DIAGNOSIS — D2272 Melanocytic nevi of left lower limb, including hip: Secondary | ICD-10-CM | POA: Diagnosis not present

## 2018-03-13 DIAGNOSIS — D2271 Melanocytic nevi of right lower limb, including hip: Secondary | ICD-10-CM | POA: Diagnosis not present

## 2018-03-13 DIAGNOSIS — D225 Melanocytic nevi of trunk: Secondary | ICD-10-CM | POA: Diagnosis not present

## 2018-03-13 DIAGNOSIS — D2261 Melanocytic nevi of right upper limb, including shoulder: Secondary | ICD-10-CM | POA: Diagnosis not present

## 2018-03-17 MED FILL — VENLAFAXINE HCL ER 37.5 MG: 37.5 | 30 days supply | Qty: 60 | Fill #0

## 2018-03-17 MED FILL — ESOMEPRAZOLE MAGNESIUM 20 M: 20 | 90 days supply | Qty: 90 | Fill #2

## 2018-04-10 DIAGNOSIS — R6889 Other general symptoms and signs: Secondary | ICD-10-CM | POA: Diagnosis not present

## 2018-04-10 DIAGNOSIS — J302 Other seasonal allergic rhinitis: Secondary | ICD-10-CM | POA: Diagnosis not present

## 2018-04-10 DIAGNOSIS — M25512 Pain in left shoulder: Secondary | ICD-10-CM | POA: Diagnosis not present

## 2018-04-10 DIAGNOSIS — M8580 Other specified disorders of bone density and structure, unspecified site: Secondary | ICD-10-CM | POA: Diagnosis not present

## 2018-04-10 DIAGNOSIS — G8929 Other chronic pain: Secondary | ICD-10-CM | POA: Diagnosis not present

## 2018-04-10 MED FILL — VENLAFAXINE HCL ER 37.5 MG: 37.5 | 90 days supply | Qty: 180 | Fill #0

## 2018-04-10 MED FILL — MELOXICAM 7.5 MG TABLET: 7.5 | 30 days supply | Qty: 30 | Fill #0

## 2018-04-10 MED FILL — CYCLOBENZAPRINE 5 MG TABLET: 5 | 30 days supply | Qty: 30 | Fill #0

## 2018-04-10 MED FILL — traMADol HCL 50 MG TABS: 50 | 30 days supply | Qty: 30 | Fill #0

## 2018-04-11 MED FILL — FLUTICASONE PROP 50 MCG SPR: 50 | 90 days supply | Qty: 48 | Fill #1

## 2018-05-06 MED FILL — MELOXICAM 7.5 MG TABLET: 7.5 | 30 days supply | Qty: 30 | Fill #1

## 2018-06-23 MED FILL — raNITIdine HCL 150 MG TABS: 150 | 90 days supply | Qty: 180 | Fill #2

## 2018-07-22 ENCOUNTER — Other Ambulatory Visit: Payer: Self-pay | Admitting: Family Medicine

## 2018-07-22 ENCOUNTER — Other Ambulatory Visit: Payer: Self-pay | Admitting: Obstetrics and Gynecology

## 2018-07-22 DIAGNOSIS — Z1231 Encounter for screening mammogram for malignant neoplasm of breast: Secondary | ICD-10-CM

## 2018-07-31 MED FILL — ESOMEPRAZOLE MAGNESIUM 20 M: 20 | 90 days supply | Qty: 90 | Fill #3

## 2018-07-31 MED FILL — FLUTICASONE PROP 50 MCG SPR: 50 | 90 days supply | Qty: 48 | Fill #2

## 2018-08-13 DIAGNOSIS — H16223 Keratoconjunctivitis sicca, not specified as Sjogren's, bilateral: Secondary | ICD-10-CM | POA: Diagnosis not present

## 2018-08-13 DIAGNOSIS — H0100A Unspecified blepharitis right eye, upper and lower eyelids: Secondary | ICD-10-CM | POA: Diagnosis not present

## 2018-08-13 DIAGNOSIS — H02831 Dermatochalasis of right upper eyelid: Secondary | ICD-10-CM | POA: Diagnosis not present

## 2018-08-13 DIAGNOSIS — H524 Presbyopia: Secondary | ICD-10-CM | POA: Diagnosis not present

## 2018-08-13 DIAGNOSIS — H5213 Myopia, bilateral: Secondary | ICD-10-CM | POA: Diagnosis not present

## 2018-08-13 DIAGNOSIS — H43393 Other vitreous opacities, bilateral: Secondary | ICD-10-CM | POA: Diagnosis not present

## 2018-08-13 DIAGNOSIS — H04123 Dry eye syndrome of bilateral lacrimal glands: Secondary | ICD-10-CM | POA: Diagnosis not present

## 2018-08-13 DIAGNOSIS — H52203 Unspecified astigmatism, bilateral: Secondary | ICD-10-CM | POA: Diagnosis not present

## 2018-08-13 DIAGNOSIS — H0100B Unspecified blepharitis left eye, upper and lower eyelids: Secondary | ICD-10-CM | POA: Diagnosis not present

## 2018-08-29 ENCOUNTER — Ambulatory Visit
Admission: RE | Admit: 2018-08-29 | Discharge: 2018-08-29 | Disposition: A | Discharge status: Home / Self Care | Payer: 59 | Source: Ambulatory Visit

## 2018-08-29 DIAGNOSIS — Z1231 Encounter for screening mammogram for malignant neoplasm of breast: Secondary | ICD-10-CM | POA: Diagnosis not present

## 2018-09-12 DIAGNOSIS — L649 Androgenic alopecia, unspecified: Secondary | ICD-10-CM | POA: Diagnosis not present

## 2018-09-12 DIAGNOSIS — Z79899 Other long term (current) drug therapy: Secondary | ICD-10-CM | POA: Diagnosis not present

## 2018-09-12 DIAGNOSIS — L820 Inflamed seborrheic keratosis: Secondary | ICD-10-CM | POA: Diagnosis not present

## 2018-09-12 DIAGNOSIS — B078 Other viral warts: Secondary | ICD-10-CM | POA: Diagnosis not present

## 2018-09-16 MED FILL — raNITIdine HCL 150 MG TABS: 150 | 90 days supply | Qty: 180 | Fill #3

## 2018-09-16 MED FILL — traMADol HCL 50 MG TABS: 50 | 30 days supply | Qty: 30 | Fill #1

## 2018-09-22 MED FILL — MELOXICAM 7.5 MG TABLET: 7.5 | 30 days supply | Qty: 30 | Fill #2

## 2018-09-23 DIAGNOSIS — L649 Androgenic alopecia, unspecified: Secondary | ICD-10-CM | POA: Diagnosis not present

## 2018-09-24 MED FILL — CYCLOBENZAPRINE HCL 5 MG TA: 5 | 30 days supply | Qty: 30 | Fill #1

## 2018-09-29 DIAGNOSIS — T148XXA Other injury of unspecified body region, initial encounter: Secondary | ICD-10-CM | POA: Diagnosis not present

## 2018-10-15 MED FILL — VENLAFAXINE HCL ER 37.5 MG: 37.5 | 90 days supply | Qty: 180 | Fill #1

## 2018-11-06 DIAGNOSIS — K219 Gastro-esophageal reflux disease without esophagitis: Secondary | ICD-10-CM | POA: Diagnosis not present

## 2018-11-06 DIAGNOSIS — M8580 Other specified disorders of bone density and structure, unspecified site: Secondary | ICD-10-CM | POA: Diagnosis not present

## 2018-11-06 DIAGNOSIS — Z0000 Encounter for general adult medical examination without abnormal findings: Secondary | ICD-10-CM | POA: Diagnosis not present

## 2018-11-07 DIAGNOSIS — L649 Androgenic alopecia, unspecified: Secondary | ICD-10-CM | POA: Diagnosis not present

## 2018-11-11 DIAGNOSIS — J302 Other seasonal allergic rhinitis: Secondary | ICD-10-CM | POA: Diagnosis not present

## 2018-11-11 DIAGNOSIS — G8929 Other chronic pain: Secondary | ICD-10-CM | POA: Diagnosis not present

## 2018-11-11 DIAGNOSIS — E559 Vitamin D deficiency, unspecified: Secondary | ICD-10-CM | POA: Insufficient documentation

## 2018-11-11 DIAGNOSIS — Z0000 Encounter for general adult medical examination without abnormal findings: Secondary | ICD-10-CM | POA: Diagnosis not present

## 2018-11-11 DIAGNOSIS — M25512 Pain in left shoulder: Secondary | ICD-10-CM | POA: Diagnosis not present

## 2018-11-11 DIAGNOSIS — T148XXA Other injury of unspecified body region, initial encounter: Secondary | ICD-10-CM | POA: Diagnosis not present

## 2018-11-11 DIAGNOSIS — K219 Gastro-esophageal reflux disease without esophagitis: Secondary | ICD-10-CM | POA: Diagnosis not present

## 2018-12-15 MED FILL — FLUTICASONE PROP 50 MCG SPR: 50 | 90 days supply | Qty: 48 | Fill #0

## 2018-12-18 MED FILL — FAMOTIDINE 20 MG TABLET: 20 | 45 days supply | Qty: 90 | Fill #0

## 2019-01-12 MED FILL — FAMOTIDINE 20 MG TABLET: 20 | 30 days supply | Qty: 60 | Fill #0

## 2019-02-02 MED FILL — FAMOTIDINE 20 MG TABS: 20 | 30 days supply | Qty: 60 | Fill #1

## 2019-02-20 DIAGNOSIS — Z01419 Encounter for gynecological examination (general) (routine) without abnormal findings: Secondary | ICD-10-CM | POA: Diagnosis not present

## 2019-02-20 DIAGNOSIS — Z6826 Body mass index (BMI) 26.0-26.9, adult: Secondary | ICD-10-CM | POA: Diagnosis not present

## 2019-02-27 MED FILL — CYCLOBENZAPRINE HCL 5 MG TA: 5 | 30 days supply | Qty: 30 | Fill #0

## 2019-03-02 MED FILL — SM ACID REDUCER 10 MG TABS: 10 | 30 days supply | Qty: 120 | Fill #0

## 2019-04-01 MED FILL — VENLAFAXINE HCL ER 37.5 MG: 37.5 | 90 days supply | Qty: 90 | Fill #0

## 2019-04-01 MED FILL — SM ACID REDUCER 20 MG TAB: 20 | 25 days supply | Qty: 50 | Fill #2

## 2019-04-21 DIAGNOSIS — D2272 Melanocytic nevi of left lower limb, including hip: Secondary | ICD-10-CM | POA: Diagnosis not present

## 2019-04-21 DIAGNOSIS — D2271 Melanocytic nevi of right lower limb, including hip: Secondary | ICD-10-CM | POA: Diagnosis not present

## 2019-04-21 DIAGNOSIS — D2261 Melanocytic nevi of right upper limb, including shoulder: Secondary | ICD-10-CM | POA: Diagnosis not present

## 2019-04-21 DIAGNOSIS — L814 Other melanin hyperpigmentation: Secondary | ICD-10-CM | POA: Diagnosis not present

## 2019-04-21 DIAGNOSIS — D1801 Hemangioma of skin and subcutaneous tissue: Secondary | ICD-10-CM | POA: Diagnosis not present

## 2019-04-21 DIAGNOSIS — D225 Melanocytic nevi of trunk: Secondary | ICD-10-CM | POA: Diagnosis not present

## 2019-04-21 DIAGNOSIS — D2262 Melanocytic nevi of left upper limb, including shoulder: Secondary | ICD-10-CM | POA: Diagnosis not present

## 2019-04-21 DIAGNOSIS — L821 Other seborrheic keratosis: Secondary | ICD-10-CM | POA: Diagnosis not present

## 2019-04-21 DIAGNOSIS — L918 Other hypertrophic disorders of the skin: Secondary | ICD-10-CM | POA: Diagnosis not present

## 2019-05-09 DIAGNOSIS — E559 Vitamin D deficiency, unspecified: Secondary | ICD-10-CM | POA: Diagnosis not present

## 2019-05-12 DIAGNOSIS — E559 Vitamin D deficiency, unspecified: Secondary | ICD-10-CM | POA: Diagnosis not present

## 2019-05-12 DIAGNOSIS — M25512 Pain in left shoulder: Secondary | ICD-10-CM | POA: Diagnosis not present

## 2019-05-12 DIAGNOSIS — G8929 Other chronic pain: Secondary | ICD-10-CM | POA: Diagnosis not present

## 2019-06-08 MED FILL — FLUTICASONE PROP 50 MCG SPR: 50 | 90 days supply | Qty: 48 | Fill #1

## 2019-06-08 MED FILL — SM ACID REDUCER 20 MG TAB: 20 | 25 days supply | Qty: 50 | Fill #3

## 2019-06-29 MED FILL — VENLAFAXINE HCL ER 37.5 MG: 37.5 | 90 days supply | Qty: 90 | Fill #1

## 2019-06-29 MED FILL — CYCLOBENZAPRINE HCL 5 MG TA: 5 | 30 days supply | Qty: 30 | Fill #1

## 2019-06-29 MED FILL — FAMOTIDINE 20 MG TABS: 20 | 25 days supply | Qty: 50 | Fill #4

## 2019-07-15 DIAGNOSIS — N819 Female genital prolapse, unspecified: Secondary | ICD-10-CM | POA: Diagnosis not present

## 2019-07-17 ENCOUNTER — Other Ambulatory Visit: Payer: Self-pay | Admitting: Obstetrics and Gynecology

## 2019-07-17 DIAGNOSIS — Z1231 Encounter for screening mammogram for malignant neoplasm of breast: Secondary | ICD-10-CM

## 2019-07-27 MED FILL — FAMOTIDINE 20 MG TABS: 20 | 25 days supply | Qty: 50 | Fill #5

## 2019-08-19 DIAGNOSIS — H5213 Myopia, bilateral: Secondary | ICD-10-CM | POA: Diagnosis not present

## 2019-08-19 DIAGNOSIS — H0100B Unspecified blepharitis left eye, upper and lower eyelids: Secondary | ICD-10-CM | POA: Diagnosis not present

## 2019-08-19 DIAGNOSIS — H43392 Other vitreous opacities, left eye: Secondary | ICD-10-CM | POA: Diagnosis not present

## 2019-08-19 DIAGNOSIS — H0100A Unspecified blepharitis right eye, upper and lower eyelids: Secondary | ICD-10-CM | POA: Diagnosis not present

## 2019-08-19 DIAGNOSIS — H02831 Dermatochalasis of right upper eyelid: Secondary | ICD-10-CM | POA: Diagnosis not present

## 2019-08-19 DIAGNOSIS — H524 Presbyopia: Secondary | ICD-10-CM | POA: Diagnosis not present

## 2019-08-19 DIAGNOSIS — H52203 Unspecified astigmatism, bilateral: Secondary | ICD-10-CM | POA: Diagnosis not present

## 2019-08-19 DIAGNOSIS — H43811 Vitreous degeneration, right eye: Secondary | ICD-10-CM | POA: Diagnosis not present

## 2019-08-19 DIAGNOSIS — H2513 Age-related nuclear cataract, bilateral: Secondary | ICD-10-CM | POA: Diagnosis not present

## 2019-08-24 MED FILL — FAMOTIDINE 20 MG TABS: 20 | 90 days supply | Qty: 180 | Fill #0

## 2019-09-02 ENCOUNTER — Other Ambulatory Visit: Payer: Self-pay

## 2019-09-02 ENCOUNTER — Ambulatory Visit
Admission: RE | Admit: 2019-09-02 | Discharge: 2019-09-02 | Disposition: A | Discharge status: Home / Self Care | Payer: 59 | Source: Ambulatory Visit | Attending: Obstetrics and Gynecology | Admitting: Obstetrics and Gynecology

## 2019-09-02 DIAGNOSIS — Z1231 Encounter for screening mammogram for malignant neoplasm of breast: Secondary | ICD-10-CM | POA: Diagnosis not present

## 2019-09-08 DIAGNOSIS — R82998 Other abnormal findings in urine: Secondary | ICD-10-CM | POA: Diagnosis not present

## 2019-09-08 DIAGNOSIS — N8110 Cystocele, unspecified: Secondary | ICD-10-CM | POA: Diagnosis not present

## 2019-09-08 DIAGNOSIS — N819 Female genital prolapse, unspecified: Secondary | ICD-10-CM | POA: Diagnosis not present

## 2019-09-12 MED FILL — FLUTICASONE PROP 50 MCG SPR: 50 | 90 days supply | Qty: 48 | Fill #2

## 2019-09-28 MED FILL — VENLAFAXINE HCL ER 37.5 MG: 37.5 | 90 days supply | Qty: 90 | Fill #2

## 2019-11-11 DIAGNOSIS — N819 Female genital prolapse, unspecified: Secondary | ICD-10-CM | POA: Diagnosis not present

## 2019-11-11 DIAGNOSIS — R82998 Other abnormal findings in urine: Secondary | ICD-10-CM | POA: Diagnosis not present

## 2019-11-16 DIAGNOSIS — N819 Female genital prolapse, unspecified: Secondary | ICD-10-CM | POA: Diagnosis not present

## 2019-11-16 DIAGNOSIS — Z01812 Encounter for preprocedural laboratory examination: Secondary | ICD-10-CM | POA: Diagnosis not present

## 2019-11-16 DIAGNOSIS — Z20822 Contact with and (suspected) exposure to covid-19: Secondary | ICD-10-CM | POA: Diagnosis not present

## 2019-11-23 DIAGNOSIS — N393 Stress incontinence (female) (male): Secondary | ICD-10-CM | POA: Diagnosis not present

## 2019-11-23 DIAGNOSIS — N3641 Hypermobility of urethra: Secondary | ICD-10-CM | POA: Diagnosis not present

## 2019-11-23 DIAGNOSIS — N819 Female genital prolapse, unspecified: Secondary | ICD-10-CM | POA: Diagnosis not present

## 2019-11-23 DIAGNOSIS — N812 Incomplete uterovaginal prolapse: Secondary | ICD-10-CM | POA: Diagnosis not present

## 2019-11-23 DIAGNOSIS — N7011 Chronic salpingitis: Secondary | ICD-10-CM | POA: Diagnosis not present

## 2019-11-23 DIAGNOSIS — N838 Other noninflammatory disorders of ovary, fallopian tube and broad ligament: Secondary | ICD-10-CM | POA: Diagnosis not present

## 2019-11-23 DIAGNOSIS — N8110 Cystocele, unspecified: Secondary | ICD-10-CM | POA: Diagnosis not present

## 2019-11-23 MED FILL — FAMOTIDINE 20 MG TABS: 20 | 90 days supply | Qty: 180 | Fill #0

## 2019-11-24 DIAGNOSIS — N393 Stress incontinence (female) (male): Secondary | ICD-10-CM | POA: Diagnosis not present

## 2019-11-24 DIAGNOSIS — N7011 Chronic salpingitis: Secondary | ICD-10-CM | POA: Diagnosis not present

## 2019-11-24 DIAGNOSIS — N838 Other noninflammatory disorders of ovary, fallopian tube and broad ligament: Secondary | ICD-10-CM | POA: Diagnosis not present

## 2019-11-24 DIAGNOSIS — N8110 Cystocele, unspecified: Secondary | ICD-10-CM | POA: Diagnosis not present

## 2019-11-24 DIAGNOSIS — N3641 Hypermobility of urethra: Secondary | ICD-10-CM | POA: Diagnosis not present

## 2019-11-24 DIAGNOSIS — R9431 Abnormal electrocardiogram [ECG] [EKG]: Secondary | ICD-10-CM | POA: Diagnosis not present

## 2020-01-01 DIAGNOSIS — R82998 Other abnormal findings in urine: Secondary | ICD-10-CM | POA: Diagnosis not present

## 2020-01-13 DIAGNOSIS — E559 Vitamin D deficiency, unspecified: Secondary | ICD-10-CM | POA: Diagnosis not present

## 2020-01-14 DIAGNOSIS — L739 Follicular disorder, unspecified: Secondary | ICD-10-CM | POA: Diagnosis not present

## 2020-01-14 DIAGNOSIS — L738 Other specified follicular disorders: Secondary | ICD-10-CM | POA: Diagnosis not present

## 2020-01-18 ENCOUNTER — Other Ambulatory Visit (HOSPITAL_COMMUNITY): Payer: Self-pay | Admitting: Family Medicine

## 2020-01-18 DIAGNOSIS — J302 Other seasonal allergic rhinitis: Secondary | ICD-10-CM | POA: Diagnosis not present

## 2020-01-18 DIAGNOSIS — K219 Gastro-esophageal reflux disease without esophagitis: Secondary | ICD-10-CM | POA: Diagnosis not present

## 2020-01-18 DIAGNOSIS — Z0000 Encounter for general adult medical examination without abnormal findings: Secondary | ICD-10-CM | POA: Diagnosis not present

## 2020-01-18 DIAGNOSIS — E559 Vitamin D deficiency, unspecified: Secondary | ICD-10-CM | POA: Diagnosis not present

## 2020-01-18 DIAGNOSIS — M85852 Other specified disorders of bone density and structure, left thigh: Secondary | ICD-10-CM | POA: Diagnosis not present

## 2020-01-18 DIAGNOSIS — R6889 Other general symptoms and signs: Secondary | ICD-10-CM | POA: Diagnosis not present

## 2020-01-18 DIAGNOSIS — G8929 Other chronic pain: Secondary | ICD-10-CM | POA: Diagnosis not present

## 2020-01-18 DIAGNOSIS — M25512 Pain in left shoulder: Secondary | ICD-10-CM | POA: Diagnosis not present

## 2020-01-18 DIAGNOSIS — M85851 Other specified disorders of bone density and structure, right thigh: Secondary | ICD-10-CM | POA: Diagnosis not present

## 2020-01-18 MED FILL — VENLAFAXINE HCL ER 37.5 MG: 37.5 | 90 days supply | Qty: 90 | Fill #0

## 2020-01-18 MED FILL — FLUTICASONE PROP 50 MCG SPR: 50 | 90 days supply | Qty: 48 | Fill #0

## 2020-02-23 DIAGNOSIS — Z8619 Personal history of other infectious and parasitic diseases: Secondary | ICD-10-CM | POA: Diagnosis not present

## 2020-02-23 DIAGNOSIS — L989 Disorder of the skin and subcutaneous tissue, unspecified: Secondary | ICD-10-CM | POA: Diagnosis not present

## 2020-02-23 DIAGNOSIS — H9390 Unspecified disorder of ear, unspecified ear: Secondary | ICD-10-CM | POA: Diagnosis not present

## 2020-03-02 DIAGNOSIS — Z01419 Encounter for gynecological examination (general) (routine) without abnormal findings: Secondary | ICD-10-CM | POA: Diagnosis not present

## 2020-03-02 DIAGNOSIS — Z13820 Encounter for screening for osteoporosis: Secondary | ICD-10-CM | POA: Diagnosis not present

## 2020-03-02 DIAGNOSIS — Q622 Congenital megaureter: Secondary | ICD-10-CM | POA: Insufficient documentation

## 2020-03-02 DIAGNOSIS — Z6829 Body mass index (BMI) 29.0-29.9, adult: Secondary | ICD-10-CM | POA: Diagnosis not present

## 2020-03-14 MED FILL — FAMOTIDINE 20 MG TABLET: 20 | 90 days supply | Qty: 180 | Fill #0

## 2020-04-17 MED FILL — VENLAFAXINE HCL ER 37.5 MG: 37.5 | 90 days supply | Qty: 90 | Fill #1

## 2020-04-18 MED FILL — FLUTICASONE PROP 50 MCG SPR: 50 | 90 days supply | Qty: 48 | Fill #1

## 2020-06-11 MED FILL — FAMOTIDINE 20 MG TABLET: 20 | 90 days supply | Qty: 180 | Fill #1

## 2020-06-16 DIAGNOSIS — D2272 Melanocytic nevi of left lower limb, including hip: Secondary | ICD-10-CM | POA: Diagnosis not present

## 2020-06-16 DIAGNOSIS — L918 Other hypertrophic disorders of the skin: Secondary | ICD-10-CM | POA: Diagnosis not present

## 2020-06-16 DIAGNOSIS — D2239 Melanocytic nevi of other parts of face: Secondary | ICD-10-CM | POA: Diagnosis not present

## 2020-06-16 DIAGNOSIS — L738 Other specified follicular disorders: Secondary | ICD-10-CM | POA: Diagnosis not present

## 2020-06-16 DIAGNOSIS — D225 Melanocytic nevi of trunk: Secondary | ICD-10-CM | POA: Diagnosis not present

## 2020-06-16 DIAGNOSIS — D2271 Melanocytic nevi of right lower limb, including hip: Secondary | ICD-10-CM | POA: Diagnosis not present

## 2020-06-16 DIAGNOSIS — D2261 Melanocytic nevi of right upper limb, including shoulder: Secondary | ICD-10-CM | POA: Diagnosis not present

## 2020-06-16 DIAGNOSIS — D2262 Melanocytic nevi of left upper limb, including shoulder: Secondary | ICD-10-CM | POA: Diagnosis not present

## 2020-06-16 DIAGNOSIS — L821 Other seborrheic keratosis: Secondary | ICD-10-CM | POA: Diagnosis not present

## 2020-07-15 MED FILL — VENLAFAXINE HCL ER 37.5 MG: 37.5 | 90 days supply | Qty: 90 | Fill #2

## 2020-08-14 MED FILL — FLUTICASONE PROP 50 MCG SPR: 50 | 90 days supply | Qty: 48 | Fill #2

## 2020-08-23 DIAGNOSIS — H31002 Unspecified chorioretinal scars, left eye: Secondary | ICD-10-CM | POA: Diagnosis not present

## 2020-08-23 DIAGNOSIS — H0100A Unspecified blepharitis right eye, upper and lower eyelids: Secondary | ICD-10-CM | POA: Diagnosis not present

## 2020-08-23 DIAGNOSIS — H0100B Unspecified blepharitis left eye, upper and lower eyelids: Secondary | ICD-10-CM | POA: Diagnosis not present

## 2020-08-23 DIAGNOSIS — H04123 Dry eye syndrome of bilateral lacrimal glands: Secondary | ICD-10-CM | POA: Diagnosis not present

## 2020-08-23 DIAGNOSIS — H5213 Myopia, bilateral: Secondary | ICD-10-CM | POA: Diagnosis not present

## 2020-08-23 DIAGNOSIS — H02831 Dermatochalasis of right upper eyelid: Secondary | ICD-10-CM | POA: Diagnosis not present

## 2020-08-23 DIAGNOSIS — H2513 Age-related nuclear cataract, bilateral: Secondary | ICD-10-CM | POA: Insufficient documentation

## 2020-08-23 DIAGNOSIS — H02834 Dermatochalasis of left upper eyelid: Secondary | ICD-10-CM | POA: Diagnosis not present

## 2020-08-23 DIAGNOSIS — H43392 Other vitreous opacities, left eye: Secondary | ICD-10-CM | POA: Diagnosis not present

## 2020-08-23 DIAGNOSIS — H43811 Vitreous degeneration, right eye: Secondary | ICD-10-CM | POA: Diagnosis not present

## 2020-09-05 ENCOUNTER — Other Ambulatory Visit: Payer: Self-pay | Admitting: Family Medicine

## 2020-09-05 DIAGNOSIS — Z1231 Encounter for screening mammogram for malignant neoplasm of breast: Secondary | ICD-10-CM

## 2020-09-07 ENCOUNTER — Ambulatory Visit
Admission: RE | Admit: 2020-09-07 | Discharge: 2020-09-07 | Disposition: A | Discharge status: Home / Self Care | Payer: 59 | Source: Ambulatory Visit

## 2020-09-07 ENCOUNTER — Other Ambulatory Visit: Payer: Self-pay

## 2020-09-07 DIAGNOSIS — Z1231 Encounter for screening mammogram for malignant neoplasm of breast: Secondary | ICD-10-CM

## 2020-09-09 MED FILL — FAMOTIDINE 20 MG TABLET: 20 | 90 days supply | Qty: 180 | Fill #2

## 2020-10-05 ENCOUNTER — Other Ambulatory Visit (HOSPITAL_COMMUNITY): Payer: Self-pay | Admitting: Internal Medicine

## 2020-10-08 MED FILL — VENLAFAXINE HCL ER 37.5 MG: 37.5 | 90 days supply | Qty: 90 | Fill #3

## 2020-10-12 MED FILL — SHINGRIX 50 MCG SUS: 50 | 1 days supply | Qty: 1 | Fill #0

## 2020-11-15 MED FILL — FLUTICASONE PROP 50 MCG SPR: 50 | 90 days supply | Qty: 48 | Fill #3

## 2020-12-06 MED FILL — FAMOTIDINE 20 MG TABLET: 20 | 90 days supply | Qty: 180 | Fill #3

## 2021-01-03 DIAGNOSIS — Z09 Encounter for follow-up examination after completed treatment for conditions other than malignant neoplasm: Secondary | ICD-10-CM | POA: Diagnosis not present

## 2021-01-03 DIAGNOSIS — R82998 Other abnormal findings in urine: Secondary | ICD-10-CM | POA: Diagnosis not present

## 2021-01-13 DIAGNOSIS — E559 Vitamin D deficiency, unspecified: Secondary | ICD-10-CM | POA: Diagnosis not present

## 2021-01-13 DIAGNOSIS — M85851 Other specified disorders of bone density and structure, right thigh: Secondary | ICD-10-CM | POA: Diagnosis not present

## 2021-01-13 DIAGNOSIS — N951 Menopausal and female climacteric states: Secondary | ICD-10-CM | POA: Diagnosis not present

## 2021-01-13 DIAGNOSIS — M25512 Pain in left shoulder: Secondary | ICD-10-CM | POA: Diagnosis not present

## 2021-01-13 DIAGNOSIS — M85852 Other specified disorders of bone density and structure, left thigh: Secondary | ICD-10-CM | POA: Diagnosis not present

## 2021-01-13 DIAGNOSIS — K219 Gastro-esophageal reflux disease without esophagitis: Secondary | ICD-10-CM | POA: Diagnosis not present

## 2021-01-13 DIAGNOSIS — Z0000 Encounter for general adult medical examination without abnormal findings: Secondary | ICD-10-CM | POA: Diagnosis not present

## 2021-01-13 DIAGNOSIS — G8929 Other chronic pain: Secondary | ICD-10-CM | POA: Diagnosis not present

## 2021-01-13 DIAGNOSIS — J309 Allergic rhinitis, unspecified: Secondary | ICD-10-CM | POA: Diagnosis not present

## 2021-01-18 ENCOUNTER — Other Ambulatory Visit (HOSPITAL_COMMUNITY): Payer: Self-pay | Admitting: Family Medicine

## 2021-01-18 DIAGNOSIS — J302 Other seasonal allergic rhinitis: Secondary | ICD-10-CM | POA: Diagnosis not present

## 2021-01-18 DIAGNOSIS — M25512 Pain in left shoulder: Secondary | ICD-10-CM | POA: Diagnosis not present

## 2021-01-18 DIAGNOSIS — M85852 Other specified disorders of bone density and structure, left thigh: Secondary | ICD-10-CM | POA: Diagnosis not present

## 2021-01-18 DIAGNOSIS — Z0000 Encounter for general adult medical examination without abnormal findings: Secondary | ICD-10-CM | POA: Diagnosis not present

## 2021-01-18 DIAGNOSIS — E559 Vitamin D deficiency, unspecified: Secondary | ICD-10-CM | POA: Diagnosis not present

## 2021-01-18 DIAGNOSIS — R6889 Other general symptoms and signs: Secondary | ICD-10-CM | POA: Diagnosis not present

## 2021-01-18 DIAGNOSIS — M85851 Other specified disorders of bone density and structure, right thigh: Secondary | ICD-10-CM | POA: Diagnosis not present

## 2021-01-18 DIAGNOSIS — K219 Gastro-esophageal reflux disease without esophagitis: Secondary | ICD-10-CM | POA: Diagnosis not present

## 2021-01-18 DIAGNOSIS — G8929 Other chronic pain: Secondary | ICD-10-CM | POA: Diagnosis not present

## 2021-01-18 MED FILL — VENLAFAXINE HCL ER 37.5 MG: 37.5 | 90 days supply | Qty: 90 | Fill #0

## 2021-01-18 MED FILL — CYCLOBENZAPRINE HCL 5 MG TA: 5 | 30 days supply | Qty: 30 | Fill #0

## 2021-01-20 ENCOUNTER — Other Ambulatory Visit (HOSPITAL_BASED_OUTPATIENT_CLINIC_OR_DEPARTMENT_OTHER): Payer: Self-pay

## 2021-01-27 ENCOUNTER — Other Ambulatory Visit: Payer: Self-pay

## 2021-01-27 ENCOUNTER — Emergency Department (INDEPENDENT_AMBULATORY_CARE_PROVIDER_SITE_OTHER)
Admission: EM | Admit: 2021-01-27 | Discharge: 2021-01-27 | Disposition: A | Discharge status: Home / Self Care | Payer: 59 | Source: Home / Self Care

## 2021-01-27 DIAGNOSIS — S63602A Unspecified sprain of left thumb, initial encounter: Secondary | ICD-10-CM

## 2021-01-27 DIAGNOSIS — W19XXXA Unspecified fall, initial encounter: Secondary | ICD-10-CM

## 2021-01-27 DIAGNOSIS — M25562 Pain in left knee: Secondary | ICD-10-CM

## 2021-01-27 DIAGNOSIS — S0081XA Abrasion of other part of head, initial encounter: Secondary | ICD-10-CM

## 2021-01-27 DIAGNOSIS — M25561 Pain in right knee: Secondary | ICD-10-CM

## 2021-01-27 NOTE — ED Triage Notes (Signed)
Pt c/o LT hand pain (in palm) as well at abrasions to LT knee due after falling on Wednesday. Some abrasions to face/upper lip. Teeth seem to be ok. Pain 4/10

## 2021-01-27 NOTE — ED Provider Notes (Signed)
Danielle Braun [facility]    CSN: [id number] Arrival date & time: 01/27/21  1133      History   Chief Complaint Chief Complaint  Patient presents with  . Fall  . Knee Pain  . Hand Pain    LT    HPI Danielle Braun is a 60 y.o. female.   Reports that she fell off a curb when in a parking lot at night about 2 days ago. Reports that she scraped both knees, her nose and upper lip, and has been having L thumb pain since then. Has taken ibuprofen with some pain relief. Has been using neosporin to abrasion to lip and nose. Denies other injuries. Denies hitting her head, losing consciousness, excessive bleeding, erythema, tenderness, warmth, drainage from abrasions, other symptoms.  ROS per HPI  The history is provided by the patient.  Fall  Knee Pain Hand Pain    Past Medical History:  Diagnosis Date  . Acne   . Arrhythmia   . Bursitis, subacromial    Left  . Endometriosis   . GERD (gastroesophageal reflux disease)   . Sinusitis     Patient Active Problem List   Diagnosis Date Noted  . ANKLE SPRAIN, LEFT 04/10/2009  . ACUTE MAXILLARY SINUSITIS 11/06/2008  . URI 11/06/2008    Past Surgical History:  Procedure Laterality Date  . ABDOMINAL HYSTERECTOMY    . ABDOMINAL SURGERY     csection  . DILATION AND CURETTAGE OF UTERUS    . SHOULDER ARTHROSCOPY WITH SUBACROMIAL DECOMPRESSION Left 10/31/2017   Procedure: SHOULDER ARTHROSCOPY WITH BURSECTOMY AND SUBACROMIAL DECOMPRESSION;  Surgeon: Tania Ade, MD;  Location: [place];  Service: Orthopedics;  Laterality: Left;  Left shoulder arthroscopy bursectomy and subacromial decompression  . TONSILLECTOMY      OB History   No obstetric history on file.      Home Medications    Prior to Admission medications   Medication Sig Start Date End Date Taking? Authorizing Provider  famotidine (PEPCID) 20 MG tablet Take 1 tablet by mouth 2 (two) times daily. 01/18/21  Yes [provider]  venlafaxine XR (EFFEXOR-XR)  37.5 MG 24 hr capsule Take 1 capsule by mouth daily. 01/18/21  Yes [provider]  acetaminophen (TYLENOL) 500 MG tablet Take 1,000 mg by mouth every 6 (six) hours as needed for moderate pain or headache.    [provider]  Cholecalciferol (VITAMIN D3) 2000 units TABS Take 2,000 Units by mouth daily.    [provider]  cyclobenzaprine (FLEXERIL) 5 MG tablet Take 5 mg by mouth at bedtime as needed for muscle spasms.     [provider]  diclofenac sodium (VOLTAREN) 1 % GEL Apply 1 application topically 4 (four) times daily as needed (for pain).    [provider]  diphenhydrAMINE (BENADRYL) 25 mg capsule Take 25 mg by mouth at bedtime.    [provider]  esomeprazole (NEXIUM) 20 MG capsule Take 20 mg by mouth daily.     [provider]  fexofenadine (ALLEGRA) 60 MG tablet Take 60 mg by mouth daily as needed.     [provider]  fluticasone (FLONASE) 50 MCG/ACT nasal spray Place 2 sprays into both nostrils daily.     [provider]  meloxicam (MOBIC) 7.5 MG tablet Take 15 mg by mouth daily.    [provider]  pseudoephedrine (SUDAFED) 120 MG 12 hr tablet Take 120 mg by mouth daily as needed.     [provider]  ranitidine (ZANTAC) 150 MG capsule Take 150 mg by mouth 2 (two) times daily.    [provider]  traMADol (ULTRAM) 50 MG tablet Take 50 mg by mouth 2 (two) times daily as needed for moderate pain.     [provider]    Family History Family History  Problem Relation Age of Onset  . Hyperlipidemia Mother   . Cancer Father        bladder  . Diabetes Brother   . Breast cancer Neg Hx     Social History Social History   Tobacco Use  . Smoking status: Former Research scientist (life sciences)  . Smokeless tobacco: Never Used  Vaping Use  . Vaping Use: Never used  Substance Use Topics  . Alcohol use: Yes    Comment: occasional  . Drug use: No     Allergies   Patient has no known  allergies.   Review of Systems Review of Systems   Physical Exam Triage Vital Signs ED Triage Vitals  Enc Vitals Group     BP 01/27/21 1147 (!) 160/91     Pulse Rate 01/27/21 1147 69     Resp 01/27/21 1147 17     Temp 01/27/21 1147 98.1 F (36.7 C)     Temp Source 01/27/21 1147 Oral     SpO2 01/27/21 1147 97 %     Weight --      Height --      Head Circumference --      Peak Flow --      Pain Score 01/27/21 1148 4     Pain Loc --      Pain Edu? --      Excl. in [place]? --    No data found.  Updated Vital Signs BP (!) 160/91 (BP Location: Right Arm)   Pulse 69   Temp 98.1 F (36.7 C) (Oral)   Resp 17   SpO2 97%   Physical Exam Vitals and nursing note reviewed.  Constitutional:      General: She is not in acute distress.    Appearance: Normal appearance. She is well-developed. She is not ill-appearing.  HENT:     Head: Normocephalic. Abrasion present.      Comments: Area of abrasion to upper lip Eyes:     Conjunctiva/sclera: Conjunctivae normal.  Cardiovascular:     Rate and Rhythm: Normal rate and regular rhythm.     Heart sounds: Normal heart sounds. No murmur heard.   Pulmonary:     Effort: Pulmonary effort is normal. No respiratory distress.     Breath sounds: Normal breath sounds.  Abdominal:     Palpations: Abdomen is soft.     Tenderness: There is no abdominal tenderness.  Musculoskeletal:     Cervical back: Normal range of motion and neck supple.     Right knee: Ecchymosis present.     Left knee: Ecchymosis present.       Legs:     Comments: Abrasions as above  Skin:    General: Skin is warm and dry.     Capillary Refill: Capillary refill takes less than 2 seconds.  Neurological:     General: No focal deficit present.     Mental Status: She is alert and oriented to person, place, and time.  Psychiatric:        Mood and Affect: Mood normal.        Behavior: Behavior normal.        Thought Content: Thought content normal.  UC  Treatments / Results  Labs (all labs ordered are listed, but only abnormal results are displayed) Labs Reviewed - No data to display  EKG   Radiology No results found.  Procedures Procedures (including critical care time)  Medications Ordered in UC Medications - No data to display  Initial Impression / Assessment and Plan / UC Course  I have reviewed the triage vital signs and the nursing notes.  Pertinent labs & imaging results that were available during my care of the patient were reviewed by me and considered in my medical decision making (see chart for details).    Fall Bilateral knee pain Facial Abrasion L thumb sprain  Applied left wrist with thumb ABD brace in office Wear when active and when sleeping May take ibuprofen and use ice as needed for pain May continue neosporin to the abrasions May use vitamin E oil after abrasions heal to help minimize scarring Follow up with this office or with primary care if symptoms are persisting.  Follow up in the ER for high fever, trouble swallowing, trouble breathing, other concerning symptoms.   Final Clinical Impressions(s) / UC Diagnoses   Final diagnoses:  Fall, initial encounter  Acute pain of both knees  Abrasion of face, initial encounter  Sprain of left thumb, unspecified site of digit, initial encounter     Discharge Instructions     Wear the brace when active for comfort, and when sleeping.   May use neosporin to the abrasions and then vitamin E oil once healed to help minimize scarring.  Follow up with continued or worsening symptoms with PCP    ED Prescriptions    None     PDMP not reviewed this encounter.   Faustino Congress, NP 01/27/21 1303

## 2021-01-27 NOTE — Discharge Instructions (Signed)
Wear the brace when active for comfort, and when sleeping.   May use neosporin to the abrasions and then vitamin E oil once healed to help minimize scarring.  Follow up with continued or worsening symptoms with PCP

## 2021-01-31 ENCOUNTER — Other Ambulatory Visit (HOSPITAL_COMMUNITY): Payer: Self-pay

## 2021-01-31 MED FILL — Zoster Vac Recombinant Adjuvanted for IM Inj 50 MCG/0.5ML: INTRAMUSCULAR | 30 days supply | Qty: 1 | Fill #0 | Status: AC

## 2021-02-01 ENCOUNTER — Other Ambulatory Visit (HOSPITAL_COMMUNITY): Payer: Self-pay

## 2021-02-02 ENCOUNTER — Other Ambulatory Visit (HOSPITAL_COMMUNITY): Payer: Self-pay

## 2021-02-06 ENCOUNTER — Other Ambulatory Visit (HOSPITAL_COMMUNITY): Payer: Self-pay

## 2021-02-08 ENCOUNTER — Other Ambulatory Visit (HOSPITAL_COMMUNITY): Payer: Self-pay

## 2021-02-09 ENCOUNTER — Other Ambulatory Visit (HOSPITAL_COMMUNITY): Payer: Self-pay

## 2021-03-01 ENCOUNTER — Other Ambulatory Visit (HOSPITAL_COMMUNITY): Payer: Self-pay

## 2021-03-01 MED FILL — Famotidine Tab 20 MG: ORAL | 90 days supply | Qty: 180 | Fill #0 | Status: AC

## 2021-03-02 ENCOUNTER — Other Ambulatory Visit (HOSPITAL_COMMUNITY): Payer: Self-pay

## 2021-03-31 DIAGNOSIS — Z90710 Acquired absence of both cervix and uterus: Secondary | ICD-10-CM | POA: Insufficient documentation

## 2021-03-31 DIAGNOSIS — Z01419 Encounter for gynecological examination (general) (routine) without abnormal findings: Secondary | ICD-10-CM | POA: Diagnosis not present

## 2021-03-31 DIAGNOSIS — Z6831 Body mass index (BMI) 31.0-31.9, adult: Secondary | ICD-10-CM | POA: Diagnosis not present

## 2021-03-31 DIAGNOSIS — Z9889 Other specified postprocedural states: Secondary | ICD-10-CM | POA: Insufficient documentation

## 2021-04-13 ENCOUNTER — Ambulatory Visit: Payer: 59

## 2021-04-15 ENCOUNTER — Other Ambulatory Visit (HOSPITAL_COMMUNITY): Payer: Self-pay

## 2021-04-15 MED FILL — Venlafaxine HCl Cap ER 24HR 37.5 MG (Base Equivalent): ORAL | 90 days supply | Qty: 90 | Fill #0 | Status: AC

## 2021-04-21 ENCOUNTER — Other Ambulatory Visit (HOSPITAL_COMMUNITY): Payer: Self-pay

## 2021-05-10 DIAGNOSIS — R1032 Left lower quadrant pain: Secondary | ICD-10-CM | POA: Diagnosis not present

## 2021-05-10 DIAGNOSIS — N959 Unspecified menopausal and perimenopausal disorder: Secondary | ICD-10-CM | POA: Diagnosis not present

## 2021-05-10 DIAGNOSIS — R102 Pelvic and perineal pain: Secondary | ICD-10-CM | POA: Diagnosis not present

## 2021-05-19 ENCOUNTER — Other Ambulatory Visit (HOSPITAL_COMMUNITY): Payer: Self-pay

## 2021-05-19 MED FILL — Fluticasone Propionate Nasal Susp 50 MCG/ACT: NASAL | 90 days supply | Qty: 48 | Fill #0 | Status: AC

## 2021-05-19 MED FILL — Cyclobenzaprine HCl Tab 5 MG: ORAL | 30 days supply | Qty: 30 | Fill #0 | Status: CN

## 2021-05-30 MED FILL — Famotidine Tab 20 MG: ORAL | 90 days supply | Qty: 180 | Fill #1 | Status: AC

## 2021-05-31 ENCOUNTER — Other Ambulatory Visit (HOSPITAL_COMMUNITY): Payer: Self-pay

## 2021-05-31 MED ORDER — PEG-3350/ELECTROLYTES 236 G PO SOLR
ORAL | 0 refills | Status: DC
  Filled 2021-05-31: qty 4000, 1d supply, fill #0

## 2021-06-20 DIAGNOSIS — Z86010 Personal history of colonic polyps: Secondary | ICD-10-CM | POA: Diagnosis not present

## 2021-06-20 DIAGNOSIS — D120 Benign neoplasm of cecum: Secondary | ICD-10-CM | POA: Diagnosis not present

## 2021-06-27 DIAGNOSIS — Z23 Encounter for immunization: Secondary | ICD-10-CM | POA: Diagnosis not present

## 2021-07-05 DIAGNOSIS — H33312 Horseshoe tear of retina without detachment, left eye: Secondary | ICD-10-CM | POA: Insufficient documentation

## 2021-07-05 DIAGNOSIS — H2513 Age-related nuclear cataract, bilateral: Secondary | ICD-10-CM | POA: Diagnosis not present

## 2021-07-05 DIAGNOSIS — D3141 Benign neoplasm of right ciliary body: Secondary | ICD-10-CM | POA: Diagnosis not present

## 2021-07-05 DIAGNOSIS — H43812 Vitreous degeneration, left eye: Secondary | ICD-10-CM | POA: Diagnosis not present

## 2021-07-05 DIAGNOSIS — H31002 Unspecified chorioretinal scars, left eye: Secondary | ICD-10-CM | POA: Diagnosis not present

## 2021-07-12 DIAGNOSIS — Z48810 Encounter for surgical aftercare following surgery on the sense organs: Secondary | ICD-10-CM | POA: Diagnosis not present

## 2021-07-12 DIAGNOSIS — D3141 Benign neoplasm of right ciliary body: Secondary | ICD-10-CM | POA: Diagnosis not present

## 2021-07-12 DIAGNOSIS — H33312 Horseshoe tear of retina without detachment, left eye: Secondary | ICD-10-CM | POA: Diagnosis not present

## 2021-07-12 DIAGNOSIS — H524 Presbyopia: Secondary | ICD-10-CM | POA: Diagnosis not present

## 2021-07-12 DIAGNOSIS — H2513 Age-related nuclear cataract, bilateral: Secondary | ICD-10-CM | POA: Diagnosis not present

## 2021-07-12 DIAGNOSIS — H43812 Vitreous degeneration, left eye: Secondary | ICD-10-CM | POA: Diagnosis not present

## 2021-07-12 DIAGNOSIS — H5213 Myopia, bilateral: Secondary | ICD-10-CM | POA: Diagnosis not present

## 2021-07-12 DIAGNOSIS — H52203 Unspecified astigmatism, bilateral: Secondary | ICD-10-CM | POA: Diagnosis not present

## 2021-07-18 DIAGNOSIS — R1032 Left lower quadrant pain: Secondary | ICD-10-CM | POA: Diagnosis not present

## 2021-07-19 ENCOUNTER — Other Ambulatory Visit (HOSPITAL_COMMUNITY): Payer: Self-pay | Admitting: Nurse Practitioner

## 2021-07-19 ENCOUNTER — Other Ambulatory Visit: Payer: Self-pay | Admitting: Nurse Practitioner

## 2021-07-19 DIAGNOSIS — R1032 Left lower quadrant pain: Secondary | ICD-10-CM

## 2021-07-23 MED FILL — Venlafaxine HCl Cap ER 24HR 37.5 MG (Base Equivalent): ORAL | 90 days supply | Qty: 90 | Fill #1 | Status: AC

## 2021-07-24 ENCOUNTER — Other Ambulatory Visit (HOSPITAL_COMMUNITY): Payer: Self-pay

## 2021-07-25 ENCOUNTER — Telehealth: Payer: 59 | Admitting: Physician Assistant

## 2021-07-25 DIAGNOSIS — L03011 Cellulitis of right finger: Secondary | ICD-10-CM | POA: Diagnosis not present

## 2021-07-25 MED ORDER — CEPHALEXIN 500 MG PO CAPS: 500.0000 mg | ORAL_CAPSULE | Freq: Four times a day (QID) | ORAL | 0 refills | Status: DC

## 2021-07-25 NOTE — Progress Notes (Signed)
E Visit for Cellulitis  We are sorry that you are not feeling well. Here is how we plan to help!  Based on what you shared with me it looks like you have cellulitis.  Cellulitis looks like areas of skin redness, swelling, and warmth; it develops as a result of bacteria entering under the skin. Little red spots and/or bleeding can be seen in skin, and tiny surface sacs containing fluid can occur. Fever can be present. Cellulitis is almost always on one side of a body, and the lower limbs are the most common site of involvement. This is more so paronychia. This is a nail infection.  I have prescribed:  Keflex 500mg  take one by mouth four times a day for 5 days  HOME CARE:  Take your medications as ordered and take all of them, even if the skin irritation appears to be healing.   GET HELP RIGHT AWAY IF:  Symptoms that don't begin to go away within 48 hours. Severe redness persists or worsens If the area turns color, spreads or swells. If it blisters and opens, develops yellow-brown crust or bleeds. You develop a fever or chills. If the pain increases or becomes unbearable.  Are unable to keep fluids and food down.  MAKE SURE YOU   Understand these instructions. Will watch your condition. Will get help right away if you are not doing well or get worse.  Thank you for choosing an e-visit.  Your e-visit answers were reviewed by a board certified advanced clinical practitioner to complete your personal care plan. Depending upon the condition, your plan could have included both over the counter or prescription medications.  Please review your pharmacy choice. Make sure the pharmacy is open so you can pick up prescription now. If there is a problem, you may contact your provider through [employer] and have the prescription routed to another pharmacy.  Your safety is important to [place]. If you have drug allergies check your prescription carefully.   For the next 24 hours you can use  MyChart to ask questions about today's visit, request a non-urgent call back, or ask for a work or school excuse. You will get an email in the next two days asking about your experience. I hope that your e-visit has been valuable and will speed your recovery.  I provided 5 minutes of non face-to-face time during this encounter for chart review and documentation.

## 2021-07-27 ENCOUNTER — Ambulatory Visit: Payer: 59

## 2021-07-27 ENCOUNTER — Ambulatory Visit (HOSPITAL_COMMUNITY)
Admission: RE | Admit: 2021-07-27 | Discharge: 2021-07-27 | Disposition: A | Discharge status: Home / Self Care | Payer: 59 | Source: Ambulatory Visit | Attending: Nurse Practitioner | Admitting: Nurse Practitioner

## 2021-07-27 DIAGNOSIS — I700 Atherosclerosis of aorta: Secondary | ICD-10-CM | POA: Diagnosis not present

## 2021-07-27 DIAGNOSIS — I878 Other specified disorders of veins: Secondary | ICD-10-CM | POA: Diagnosis not present

## 2021-07-27 DIAGNOSIS — R1032 Left lower quadrant pain: Secondary | ICD-10-CM | POA: Insufficient documentation

## 2021-07-27 DIAGNOSIS — N281 Cyst of kidney, acquired: Secondary | ICD-10-CM | POA: Diagnosis not present

## 2021-07-27 DIAGNOSIS — N83312 Acquired atrophy of left ovary: Secondary | ICD-10-CM | POA: Diagnosis not present

## 2021-07-27 MED ORDER — IOHEXOL 350 MG/ML SOLN
80.0000 mL | Freq: Once | INTRAVENOUS | Status: AC | PRN
  Administered 2021-07-27: 80 mL via INTRAVENOUS

## 2021-07-28 ENCOUNTER — Ambulatory Visit: Payer: 59 | Attending: Internal Medicine

## 2021-07-28 DIAGNOSIS — Z23 Encounter for immunization: Secondary | ICD-10-CM

## 2021-07-28 NOTE — Progress Notes (Signed)
   Covid-19 Vaccination Clinic  Name:  DOMINGUE COLTRAIN    MRN: [id number] DOB: 06-13-1961  07/28/2021  Ms. Lashway was observed post Covid-19 immunization for 15 minutes without incident. She was provided with Vaccine Information Sheet and instruction to access the V-Safe system.   Ms. Vanwagner was instructed to call 911 with any severe reactions post vaccine: Difficulty breathing  Swelling of face and throat  A fast heartbeat  A bad rash all over body  Dizziness and weakness

## 2021-08-03 DIAGNOSIS — J069 Acute upper respiratory infection, unspecified: Secondary | ICD-10-CM | POA: Diagnosis not present

## 2021-08-06 ENCOUNTER — Emergency Department (INDEPENDENT_AMBULATORY_CARE_PROVIDER_SITE_OTHER)
Admission: EM | Admit: 2021-08-06 | Discharge: 2021-08-06 | Disposition: A | Discharge status: Home / Self Care | Payer: 59 | Source: Home / Self Care | Attending: Family Medicine | Admitting: Family Medicine

## 2021-08-06 ENCOUNTER — Other Ambulatory Visit: Payer: Self-pay

## 2021-08-06 DIAGNOSIS — J22 Unspecified acute lower respiratory infection: Secondary | ICD-10-CM | POA: Diagnosis not present

## 2021-08-06 HISTORY — DX: Horseshoe tear of retina without detachment, unspecified eye: H33.319

## 2021-08-06 MED ORDER — AZITHROMYCIN 250 MG PO TABS: 250.0000 mg | ORAL_TABLET | Freq: Every day | ORAL | 0 refills | Status: DC

## 2021-08-06 MED ORDER — BENZONATATE 200 MG PO CAPS: 200.0000 mg | ORAL_CAPSULE | Freq: Three times a day (TID) | ORAL | 0 refills | Status: DC | PRN

## 2021-08-06 MED ORDER — MONTELUKAST SODIUM 10 MG PO TABS
10.0000 mg | ORAL_TABLET | Freq: Every day | ORAL | 1 refills | Status: DC
Start: 2021-08-06 — End: 2022-01-17

## 2021-08-06 MED ORDER — PREDNISONE 20 MG PO TABS: 20.0000 mg | ORAL_TABLET | Freq: Two times a day (BID) | ORAL | 0 refills | Status: DC

## 2021-08-06 NOTE — ED Triage Notes (Signed)
Pt presents to Urgent Care with c/o worsening cough and nasal congestion x 6 days. Reports going to PCP last week and was given several prescriptions. Negative home COVID tests.

## 2021-08-06 NOTE — ED Provider Notes (Signed)
[facility]    CSN: [id number] Arrival date & time: 08/06/21  1124      History   Chief Complaint Chief Complaint  Patient presents with   Cough   Nasal Congestion    HPI Danielle Braun is a 60 y.o. female.   HPI  Patient has an upper respiratory infection.  Is been present for 6 days.  She saw her primary care doctor and was given prednisone and Tessalon.  These are not helping her.  She continues to have sinus congestion, postnasal drip, cough, and a tight wheezing feeling.  She is desperate to get better ASAP because of her son's wedding in 1 week.  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  Past Medical History:  Diagnosis Date   Acne    Arrhythmia    Bursitis, subacromial    Left   Endometriosis    GERD (gastroesophageal reflux disease)    Retinal tear    Sinusitis     Patient Active Problem List   Diagnosis Date Noted   ANKLE SPRAIN, LEFT 04/10/2009   ACUTE MAXILLARY SINUSITIS 11/06/2008   URI 11/06/2008    Past Surgical History:  Procedure Laterality Date   ABDOMINAL HYSTERECTOMY     ABDOMINAL SURGERY     csection   DILATION AND CURETTAGE OF UTERUS     SHOULDER ARTHROSCOPY WITH SUBACROMIAL DECOMPRESSION Left 10/31/2017   Procedure: SHOULDER ARTHROSCOPY WITH BURSECTOMY AND SUBACROMIAL DECOMPRESSION;  Surgeon: Tania Ade, MD;  Location: [place];  Service: Orthopedics;  Laterality: Left;  Left shoulder arthroscopy bursectomy and subacromial decompression   TONSILLECTOMY      OB History   No obstetric history on file.      Home Medications    Prior to Admission medications   Medication Sig Start Date End Date Taking? Authorizing Provider  azithromycin (ZITHROMAX) 250 MG tablet Take 1 tablet (250 mg total) by mouth daily. Take first 2 tablets together, then 1 every day  until finished. 08/06/21  Yes Raylene Everts, MD  benzonatate (TESSALON) 200 MG capsule Take 1 capsule (200 mg total) by mouth 3 (three) times daily as needed for cough. 08/06/21  Yes Raylene Everts, MD  dextromethorphan-guaiFENesin [facility] DM) 30-600 MG 12hr tablet Take 1 tablet by mouth 2 (two) times daily.   Yes [provider]  estradiol (ESTRACE) 0.1 MG/GM vaginal cream Place 1 Applicatorful vaginally at bedtime.   Yes [provider]  montelukast (SINGULAIR) 10 MG tablet Take 1 tablet (10 mg total) by mouth at bedtime. 08/06/21  Yes Raylene Everts, MD  predniSONE (DELTASONE) 20 MG tablet Take 1 tablet (20 mg total) by mouth 2 (two) times daily with a meal. 08/06/21  Yes Raylene Everts, MD  acetaminophen (TYLENOL) 500 MG tablet Take 1,000 mg by mouth every 6 (six) hours as needed for moderate pain or headache.    [provider]  Cholecalciferol (VITAMIN D3) 2000 units TABS Take 2,000 Units by mouth daily.    [provider]  cyclobenzaprine (FLEXERIL) 5 MG tablet Take 5 mg by mouth at bedtime as needed for muscle spasms.     [provider]  cyclobenzaprine (FLEXERIL) 5 MG tablet TAKE 1 TABLET BY MOUTH DAILY AS NEEDED. 01/18/21 01/18/22  Jefm Petty, MD  diclofenac sodium (VOLTAREN) 1 % GEL Apply 1  application topically 4 (four) times daily as needed (for pain).    [provider]  diphenhydrAMINE (BENADRYL) 25 mg capsule Take 25 mg by mouth at bedtime as needed.    [provider]  esomeprazole (NEXIUM) 20 MG capsule Take 20 mg by mouth daily.     [provider]  famotidine (PEPCID) 20 MG tablet Take 1 tablet by mouth 2 (two) times daily. 01/18/21   [provider]  famotidine (PEPCID) 20 MG tablet TAKE 1 TABLET BY MOUTH 2 TIMES DAILY 01/18/21 01/18/22  Jefm Petty, MD  famotidine (PEPCID) 20 MG tablet TAKE 1 TABLET BY MOUTH TWICE DAILY 01/18/20 01/17/21  Jefm Petty, MD  fexofenadine (ALLEGRA)  60 MG tablet Take 60 mg by mouth daily as needed.     [provider]  fluticasone (FLONASE) 50 MCG/ACT nasal spray Place 2 sprays into both nostrils daily.     [provider]  fluticasone Asencion [facility]) 50 MCG/ACT nasal spray USE 2 SPRAYS IN [facility] NOSTRIL ONCE A DAY 01/18/21 01/18/22  Jefm Petty, MD  fluticasone [facility]) 50 MCG/ACT nasal spray USE 2 SPRAYS IN [facility] NOSTRIL ONCE A DAY 01/18/20 01/17/21  Jefm Petty, MD  meloxicam (MOBIC) 7.5 MG tablet Take 15 mg by mouth daily.    [provider]  PEG 3350-KCl-NaBcb-NaCl-NaSulf (PEG-3350/ELECTROLYTES) 236 g SOLR Use as directed 05/03/21     pseudoephedrine (SUDAFED) 120 MG 12 hr tablet Take 120 mg by mouth daily as needed.     [provider]  ranitidine (ZANTAC) 150 MG capsule Take 150 mg by mouth 2 (two) times daily.    [provider]  traMADol (ULTRAM) 50 MG tablet Take 50 mg by mouth 2 (two) times daily as needed for moderate pain.     [provider]  venlafaxine XR (EFFEXOR-XR) 37.5 MG 24 hr capsule Take 1 capsule by mouth daily. 01/18/21   [provider]  venlafaxine XR (EFFEXOR-XR) 37.5 MG 24 hr capsule TAKE 1 CAPSULE BY MOUTH ONCE A DAY 01/18/21 01/18/22  Jefm Petty, MD  Zoster Vaccine Adjuvanted [facility]) injection TO BE ADMINISTERED BY PHARMACIST 10/05/20 10/05/21  Carlyle Basques, MD    Family History Family History  Problem Relation Age of Onset   Hyperlipidemia Mother    Cancer Father        bladder   Diabetes Brother    Breast cancer Neg Hx     Social History Social History   Tobacco Use   Smoking status: Former    Types: Cigarettes   Smokeless tobacco: Never  Vaping Use   Vaping Use: Never used  Substance Use Topics   Alcohol use: Yes    Comment: occasional   Drug use: No     Allergies   Patient has no known allergies.   Review of Systems Review of Systems See HPI  Physical Exam Triage Vital Signs ED Triage Vitals  Enc Vitals Group      BP 08/06/21 1202 (!) 159/88     Pulse Rate 08/06/21 1202 82     Resp 08/06/21 1202 20     Temp 08/06/21 1202 98.4 F (36.9 C)     Temp Source 08/06/21 1202 Oral     SpO2 08/06/21 1202 99 %     Weight 08/06/21 1203 185 lb (83.9 kg)     Height 08/06/21 1203 5\' 6"  (1.676 m)     Head Circumference --      Peak Flow --      Pain Score 08/06/21 1203  4     Pain Loc --      Pain Edu? --      Excl. in [place]? --    No data found.  Updated Vital Signs BP (!) 159/88 (BP Location: Right Arm)   Pulse 82   Temp 98.4 F (36.9 C) (Oral)   Resp 20   Ht 5\' 6"  (1.676 m)   Wt 83.9 kg   SpO2 99%   BMI 29.86 kg/m      Physical Exam Constitutional:      General: She is not in acute distress.    Appearance: Normal appearance. She is well-developed.  HENT:     Head: Normocephalic and atraumatic.     Right Ear: Tympanic membrane, ear canal and external ear normal.     Left Ear: Tympanic membrane, ear canal and external ear normal.     Nose: Congestion and rhinorrhea present.     Mouth/Throat:     Mouth: Mucous membranes are moist.     Pharynx: Posterior oropharyngeal erythema present.  Eyes:     Conjunctiva/sclera: Conjunctivae normal.     Pupils: Pupils are equal, round, and reactive to light.  Cardiovascular:     Rate and Rhythm: Normal rate and regular rhythm.     Heart sounds: Normal heart sounds.  Pulmonary:     Effort: Pulmonary effort is normal. No respiratory distress.     Breath sounds: Rhonchi present.  Abdominal:     General: There is no distension.     Palpations: Abdomen is soft.  Musculoskeletal:        General: Normal range of motion.     Cervical back: Normal range of motion.  Lymphadenopathy:     Cervical: No cervical adenopathy.  Skin:    General: Skin is warm and dry.  Neurological:     Mental Status: She is alert.  Psychiatric:        Mood and Affect: Mood normal.        Behavior: Behavior normal.     UC Treatments / Results  Labs (all labs ordered are  listed, but only abnormal results are displayed) Labs Reviewed - No data to display  EKG   Radiology No results found.  Procedures Procedures (including critical care time)  Medications Ordered in UC Medications - No data to display  Initial Impression / Assessment and Plan / UC Course  I have reviewed the triage vital signs and the nursing notes.  Pertinent labs & imaging results that were available during my care of the patient were reviewed by me and considered in my medical decision making (see chart for details).     We will going to refill the prednisone.  I am going to increase her Tessalon.  I am going to provide Zithromax if she fails to improve.  I am also prescribing Singulair to see if this helps.  Follow-up with primary care Final Clinical Impressions(s) / UC Diagnoses   Final diagnoses:  LRTI (lower respiratory tract infection)     Discharge Instructions      Drink plenty of fluids Use your humidifier if you have 1 Take prednisone 2 times a day for 5 days Use Tessalon 3 times a day as needed for cough Take this in addition to a dextromethorphan (DM) product Take Singulair once a day Fill and take the Zithromax per instructions   ED Prescriptions     Medication Sig Dispense Auth. Provider   azithromycin (ZITHROMAX) 250 MG tablet Take 1 tablet (250  mg total) by mouth daily. Take first 2 tablets together, then 1 every day until finished. 6 tablet Raylene Everts, MD   predniSONE (DELTASONE) 20 MG tablet Take 1 tablet (20 mg total) by mouth 2 (two) times daily with a meal. 10 tablet Raylene Everts, MD   benzonatate (TESSALON) 200 MG capsule Take 1 capsule (200 mg total) by mouth 3 (three) times daily as needed for cough. 30 capsule Raylene Everts, MD   montelukast (SINGULAIR) 10 MG tablet Take 1 tablet (10 mg total) by mouth at bedtime. 30 tablet Raylene Everts, MD      PDMP not reviewed this encounter.   Raylene Everts, MD 08/06/21  [phone]

## 2021-08-06 NOTE — Discharge Instructions (Addendum)
Drink plenty of fluids Use your humidifier if you have 1 Take prednisone 2 times a day for 5 days Use Tessalon 3 times a day as needed for cough Take this in addition to a dextromethorphan (DM) product Take Singulair once a day Fill and take the Zithromax per instructions

## 2021-08-08 ENCOUNTER — Other Ambulatory Visit (HOSPITAL_BASED_OUTPATIENT_CLINIC_OR_DEPARTMENT_OTHER): Payer: Self-pay

## 2021-08-08 MED ORDER — MODERNA COVID-19 BIVAL BOOSTER 50 MCG/0.5ML IM SUSP
INTRAMUSCULAR | 0 refills | Status: DC
  Filled 2021-08-08: qty 0.5, 1d supply, fill #0

## 2021-08-17 ENCOUNTER — Other Ambulatory Visit: Payer: Self-pay

## 2021-08-17 ENCOUNTER — Emergency Department (INDEPENDENT_AMBULATORY_CARE_PROVIDER_SITE_OTHER)
Admission: EM | Admit: 2021-08-17 | Discharge: 2021-08-17 | Disposition: A | Discharge status: Home / Self Care | Payer: 59 | Source: Home / Self Care | Attending: Family Medicine | Admitting: Family Medicine

## 2021-08-17 DIAGNOSIS — R0789 Other chest pain: Secondary | ICD-10-CM | POA: Diagnosis not present

## 2021-08-17 MED ORDER — PREDNISONE 20 MG PO TABS: 20.0000 mg | ORAL_TABLET | Freq: Two times a day (BID) | ORAL | 0 refills | Status: DC

## 2021-08-17 NOTE — ED Triage Notes (Signed)
Pt presents to Urgent Care with c/o pain to R lower rib cage (anterior and posterior) x 6 days. Pt was seen here for a cough approx 10 days ago; reports that her cough has mostly subsided.

## 2021-08-17 NOTE — ED Provider Notes (Signed)
[facility]    CSN: [id number] Arrival date & time: 08/17/21  1434      History   Chief Complaint Chief Complaint  Patient presents with   R lower ribcage pain    HPI Danielle Braun is a 60 y.o. female.   HPI  Patient was here for an upper respiratory infection a couple weeks ago.  She was treated with antibiotics, steroids, and Singulair.  She states she got dramatically better within 2 to 3 days.  She states that 6 days ago she developed some pain in her right ribs.  Is worse with deep breath.  Worse with certain movements.  She feels a "tight" sensation when she takes a deep breath.  Sometimes feels a "pop" in her ribs when she takes a deep breath.  She has no coughing, no sputum, no fever, no fatigue, no sign of persistent respiratory infection.  She has been taking Aleve 1 pill in the morning and 1 at night as an anti-inflammatory but this has not given her substantial benefit  Past Medical History:  Diagnosis Date   Acne    Arrhythmia    Bursitis, subacromial    Left   Endometriosis    GERD (gastroesophageal reflux disease)    Retinal tear    Sinusitis     Patient Active Problem List   Diagnosis Date Noted   ANKLE SPRAIN, LEFT 04/10/2009   ACUTE MAXILLARY SINUSITIS 11/06/2008   URI 11/06/2008    Past Surgical History:  Procedure Laterality Date   ABDOMINAL HYSTERECTOMY     ABDOMINAL SURGERY     csection   DILATION AND CURETTAGE OF UTERUS     SHOULDER ARTHROSCOPY WITH SUBACROMIAL DECOMPRESSION Left 10/31/2017   Procedure: SHOULDER ARTHROSCOPY WITH BURSECTOMY AND SUBACROMIAL DECOMPRESSION;  Surgeon: Tania Ade, MD;  Location: [place];  Service: Orthopedics;  Laterality: Left;  Left shoulder arthroscopy bursectomy and subacromial decompression   TONSILLECTOMY      OB History   No obstetric history on file.      Home Medications    Prior to Admission medications   Medication Sig Start Date End Date Taking? Authorizing Provider   pseudoephedrine (SUDAFED) 120 MG 12 hr tablet Take 120 mg by mouth daily as needed.    Yes [provider]  traMADol (ULTRAM) 50 MG tablet Take 50 mg by mouth 2 (two) times daily as needed for moderate pain.    Yes [provider]  acetaminophen (TYLENOL) 500 MG tablet Take 1,000 mg by mouth every 6 (six) hours as needed for moderate pain or headache.    [provider]  benzonatate (TESSALON) 200 MG capsule Take 1 capsule (200 mg total) by mouth 3 (three) times daily as needed for cough. 08/06/21   Raylene Everts, MD  Cholecalciferol (VITAMIN D3) 2000 units TABS Take 2,000 Units by mouth daily.    [provider]  COVID-19 mRNA bivalent vaccine, Moderna, (MODERNA COVID-19 BIVAL BOOSTER) 50 MCG/0.5ML injection Inject into the muscle. 07/28/21   Carlyle Basques, MD  cyclobenzaprine (FLEXERIL) 5 MG tablet Take 5 mg by mouth at bedtime as needed for muscle spasms.     [provider]  cyclobenzaprine (FLEXERIL) 5 MG tablet TAKE 1 TABLET BY MOUTH DAILY AS NEEDED. 01/18/21 01/18/22  Jefm Petty, MD  dextromethorphan-guaiFENesin [facility] DM) 30-600 MG 12hr tablet Take 1 tablet by mouth 2 (two) times daily.    [provider]  diclofenac sodium (VOLTAREN) 1 % GEL Apply 1 application topically 4 (  four) times daily as needed (for pain).    [provider]  diphenhydrAMINE (BENADRYL) 25 mg capsule Take 25 mg by mouth at bedtime as needed.    [provider]  esomeprazole (NEXIUM) 20 MG capsule Take 20 mg by mouth daily.     [provider]  estradiol (ESTRACE) 0.1 MG/GM vaginal cream Place 1 Applicatorful vaginally at bedtime.    [provider]  famotidine (PEPCID) 20 MG tablet Take 1 tablet by mouth 2 (two) times daily. 01/18/21   [provider]  famotidine (PEPCID) 20 MG tablet TAKE 1 TABLET BY MOUTH 2 TIMES DAILY 01/18/21 01/18/22  Jefm Petty, MD  famotidine (PEPCID) 20 MG tablet TAKE 1 TABLET BY  MOUTH TWICE DAILY 01/18/20 01/17/21  Jefm Petty, MD  fexofenadine (ALLEGRA) 60 MG tablet Take 60 mg by mouth daily as needed.     [provider]  fluticasone (FLONASE) 50 MCG/ACT nasal spray Place 2 sprays into both nostrils daily.     [provider]  fluticasone Asencion [facility]) 50 MCG/ACT nasal spray USE 2 SPRAYS IN [facility] NOSTRIL ONCE A DAY 01/18/21 01/18/22  Jefm Petty, MD  fluticasone [facility]) 50 MCG/ACT nasal spray USE 2 SPRAYS IN [facility] NOSTRIL ONCE A DAY 01/18/20 01/17/21  Jefm Petty, MD  meloxicam (MOBIC) 7.5 MG tablet Take 15 mg by mouth daily.    [provider]  montelukast (SINGULAIR) 10 MG tablet Take 1 tablet (10 mg total) by mouth at bedtime. 08/06/21   Raylene Everts, MD  PEG 3350-KCl-NaBcb-NaCl-NaSulf (PEG-3350/ELECTROLYTES) 236 g SOLR Use as directed 05/03/21     predniSONE (DELTASONE) 20 MG tablet Take 1 tablet (20 mg total) by mouth 2 (two) times daily with a meal. 08/17/21   Raylene Everts, MD  ranitidine (ZANTAC) 150 MG capsule Take 150 mg by mouth 2 (two) times daily.    [provider]  venlafaxine XR (EFFEXOR-XR) 37.5 MG 24 hr capsule Take 1 capsule by mouth daily. 01/18/21   [provider]  venlafaxine XR (EFFEXOR-XR) 37.5 MG 24 hr capsule TAKE 1 CAPSULE BY MOUTH ONCE A DAY 01/18/21 01/18/22  Jefm Petty, MD  Zoster Vaccine Adjuvanted [facility]) injection TO BE ADMINISTERED BY PHARMACIST 10/05/20 10/05/21  Carlyle Basques, MD    Family History Family History  Problem Relation Age of Onset   Hyperlipidemia Mother    Cancer Father        bladder   Diabetes Brother    Breast cancer Neg Hx     Social History Social History   Tobacco Use   Smoking status: Former    Types: Cigarettes   Smokeless tobacco: Never  Vaping Use   Vaping Use: Never used  Substance Use Topics   Alcohol use: Yes    Comment: occasional   Drug use: No     Allergies   Patient has no known allergies.   Review of Systems Review of  Systems See HPI  Physical Exam Triage Vital Signs ED Triage Vitals  Enc Vitals Group     BP 08/17/21 1452 (!) 147/81     Pulse Rate 08/17/21 1452 89     Resp 08/17/21 1452 20     Temp 08/17/21 1452 98.3 F (36.8 C)     Temp Source 08/17/21 1452 Oral     SpO2 08/17/21 1452 99 %     Weight 08/17/21 1446 187 lb (84.8 kg)     Height 08/17/21 1446 5\' 6"  (1.676 m)     Head Circumference --  Peak Flow --      Pain Score 08/17/21 1446 5     Pain Loc --      Pain Edu? --      Excl. in [place]? --    No data found.  Updated Vital Signs BP (!) 147/81 (BP Location: Right Arm)   Pulse 89   Temp 98.3 F (36.8 C) (Oral)   Resp 20   Ht 5\' 6"  (2.841 m)   Wt 84.8 kg   SpO2 99%   BMI 30.18 kg/m       Physical Exam Constitutional:      General: She is not in acute distress.    Appearance: She is well-developed.     Comments: Appears well  HENT:     Head: Normocephalic and atraumatic.  Eyes:     Conjunctiva/sclera: Conjunctivae normal.     Pupils: Pupils are equal, round, and reactive to light.  Cardiovascular:     Rate and Rhythm: Normal rate and regular rhythm.     Heart sounds: Normal heart sounds.  Pulmonary:     Effort: Pulmonary effort is normal. No respiratory distress.     Breath sounds: Normal breath sounds. No stridor. No wheezing, rhonchi or rales.     Comments: Is mild tenderness in the mid to anterior axillary line on the right at approximately rib 9 and 10.  No palpable defect.  No crepitus.  Lungs are clear Chest:     Chest wall: Tenderness present.  Abdominal:     General: There is no distension.     Palpations: Abdomen is soft.  Musculoskeletal:        General: Normal range of motion.     Cervical back: Normal range of motion.  Skin:    General: Skin is warm and dry.  Neurological:     Mental Status: She is alert.  Psychiatric:        Mood and Affect: Mood normal.        Behavior: Behavior normal.     UC Treatments / Results  Labs (all labs  ordered are listed, but only abnormal results are displayed) Labs Reviewed - No data to display  EKG   Radiology No results found.  Procedures Procedures (including critical care time)  Medications Ordered in UC Medications - No data to display  Initial Impression / Assessment and Plan / UC Course  I have reviewed the triage vital signs and the nursing notes.  Pertinent labs & imaging results that were available during my care of the patient were reviewed by me and considered in my medical decision making (see chart for details).     Patient has chest wall pain likely costochondritis.  We will have her increase her Aleve and see if this helps.  If not she will try a course of prednisone.  Can see her primary care doctor or follow with me as desired Final Clinical Impressions(s) / UC Diagnoses   Final diagnoses:  Chest wall pain     Discharge Instructions      Take Aleve 2 pills in the morning and 2 pills at night until pain has improved. May continue Flexeril as needed for muscle relaxer If you fail to improve with this regimen, consider a few more days of prednisone.  This medicine is available for you to fill Call or return for problems   ED Prescriptions     Medication Sig Dispense Auth. Provider   predniSONE (DELTASONE) 20 MG tablet Take 1 tablet (20  mg total) by mouth 2 (two) times daily with a meal. 10 tablet Raylene Everts, MD      PDMP not reviewed this encounter.   Raylene Everts, MD 08/17/21 [phone]

## 2021-08-17 NOTE — Discharge Instructions (Addendum)
Take Aleve 2 pills in the morning and 2 pills at night until pain has improved. May continue Flexeril as needed for muscle relaxer If you fail to improve with this regimen, consider a few more days of prednisone.  This medicine is available for you to fill Call or return for problems

## 2021-08-26 ENCOUNTER — Other Ambulatory Visit (HOSPITAL_COMMUNITY): Payer: Self-pay

## 2021-08-26 MED FILL — Famotidine Tab 20 MG: ORAL | 90 days supply | Qty: 180 | Fill #2 | Status: AC

## 2021-08-30 DIAGNOSIS — H5213 Myopia, bilateral: Secondary | ICD-10-CM | POA: Diagnosis not present

## 2021-08-30 DIAGNOSIS — H52203 Unspecified astigmatism, bilateral: Secondary | ICD-10-CM | POA: Diagnosis not present

## 2021-08-30 DIAGNOSIS — H2513 Age-related nuclear cataract, bilateral: Secondary | ICD-10-CM | POA: Diagnosis not present

## 2021-08-30 DIAGNOSIS — H0100A Unspecified blepharitis right eye, upper and lower eyelids: Secondary | ICD-10-CM | POA: Diagnosis not present

## 2021-08-30 DIAGNOSIS — H524 Presbyopia: Secondary | ICD-10-CM | POA: Diagnosis not present

## 2021-08-30 DIAGNOSIS — H43813 Vitreous degeneration, bilateral: Secondary | ICD-10-CM | POA: Diagnosis not present

## 2021-08-30 DIAGNOSIS — H33312 Horseshoe tear of retina without detachment, left eye: Secondary | ICD-10-CM | POA: Diagnosis not present

## 2021-08-30 DIAGNOSIS — H04123 Dry eye syndrome of bilateral lacrimal glands: Secondary | ICD-10-CM | POA: Diagnosis not present

## 2021-08-30 DIAGNOSIS — H02831 Dermatochalasis of right upper eyelid: Secondary | ICD-10-CM | POA: Diagnosis not present

## 2021-09-13 ENCOUNTER — Other Ambulatory Visit: Payer: Self-pay | Admitting: Family Medicine

## 2021-09-13 DIAGNOSIS — Z1231 Encounter for screening mammogram for malignant neoplasm of breast: Secondary | ICD-10-CM

## 2021-10-10 ENCOUNTER — Other Ambulatory Visit (HOSPITAL_COMMUNITY): Payer: Self-pay

## 2021-10-10 MED FILL — Venlafaxine HCl Cap ER 24HR 37.5 MG (Base Equivalent): ORAL | 90 days supply | Qty: 90 | Fill #2 | Status: AC

## 2021-10-11 DIAGNOSIS — D2271 Melanocytic nevi of right lower limb, including hip: Secondary | ICD-10-CM | POA: Diagnosis not present

## 2021-10-11 DIAGNOSIS — D2261 Melanocytic nevi of right upper limb, including shoulder: Secondary | ICD-10-CM | POA: Diagnosis not present

## 2021-10-11 DIAGNOSIS — D1801 Hemangioma of skin and subcutaneous tissue: Secondary | ICD-10-CM | POA: Diagnosis not present

## 2021-10-11 DIAGNOSIS — D2239 Melanocytic nevi of other parts of face: Secondary | ICD-10-CM | POA: Diagnosis not present

## 2021-10-11 DIAGNOSIS — L570 Actinic keratosis: Secondary | ICD-10-CM | POA: Diagnosis not present

## 2021-10-11 DIAGNOSIS — L918 Other hypertrophic disorders of the skin: Secondary | ICD-10-CM | POA: Diagnosis not present

## 2021-10-11 DIAGNOSIS — D2272 Melanocytic nevi of left lower limb, including hip: Secondary | ICD-10-CM | POA: Diagnosis not present

## 2021-10-11 DIAGNOSIS — L821 Other seborrheic keratosis: Secondary | ICD-10-CM | POA: Diagnosis not present

## 2021-10-11 DIAGNOSIS — D225 Melanocytic nevi of trunk: Secondary | ICD-10-CM | POA: Diagnosis not present

## 2021-10-11 DIAGNOSIS — D2262 Melanocytic nevi of left upper limb, including shoulder: Secondary | ICD-10-CM | POA: Diagnosis not present

## 2021-10-26 ENCOUNTER — Ambulatory Visit: Payer: 59

## 2021-11-01 DIAGNOSIS — J329 Chronic sinusitis, unspecified: Secondary | ICD-10-CM | POA: Insufficient documentation

## 2021-11-01 DIAGNOSIS — M810 Age-related osteoporosis without current pathological fracture: Secondary | ICD-10-CM | POA: Insufficient documentation

## 2021-11-06 DIAGNOSIS — H33312 Horseshoe tear of retina without detachment, left eye: Secondary | ICD-10-CM | POA: Diagnosis not present

## 2021-11-09 ENCOUNTER — Other Ambulatory Visit (HOSPITAL_COMMUNITY): Payer: Self-pay

## 2021-11-09 ENCOUNTER — Ambulatory Visit
Admission: RE | Admit: 2021-11-09 | Discharge: 2021-11-09 | Disposition: A | Discharge status: Home / Self Care | Payer: 59 | Source: Ambulatory Visit | Attending: Family Medicine | Admitting: Family Medicine

## 2021-11-09 DIAGNOSIS — R600 Localized edema: Secondary | ICD-10-CM | POA: Diagnosis not present

## 2021-11-09 DIAGNOSIS — Z1231 Encounter for screening mammogram for malignant neoplasm of breast: Secondary | ICD-10-CM

## 2021-11-09 DIAGNOSIS — M7731 Calcaneal spur, right foot: Secondary | ICD-10-CM | POA: Diagnosis not present

## 2021-11-09 DIAGNOSIS — M7989 Other specified soft tissue disorders: Secondary | ICD-10-CM | POA: Diagnosis not present

## 2021-11-09 DIAGNOSIS — J302 Other seasonal allergic rhinitis: Secondary | ICD-10-CM | POA: Diagnosis not present

## 2021-11-09 DIAGNOSIS — M79671 Pain in right foot: Secondary | ICD-10-CM | POA: Diagnosis not present

## 2021-11-09 DIAGNOSIS — M9271 Juvenile osteochondrosis of metatarsus, right foot: Secondary | ICD-10-CM | POA: Insufficient documentation

## 2021-11-09 DIAGNOSIS — R2241 Localized swelling, mass and lump, right lower limb: Secondary | ICD-10-CM | POA: Diagnosis not present

## 2021-11-09 MED ORDER — MONTELUKAST SODIUM 10 MG PO TABS
10.0000 mg | ORAL_TABLET | Freq: Every day | ORAL | 1 refills | Status: DC
  Filled 2021-11-09: qty 90, 90d supply, fill #0
  Filled 2022-02-19: qty 90, 90d supply, fill #1

## 2021-11-12 ENCOUNTER — Encounter: Payer: Self-pay | Admitting: Emergency Medicine

## 2021-11-12 ENCOUNTER — Other Ambulatory Visit: Payer: Self-pay

## 2021-11-12 ENCOUNTER — Emergency Department
Admission: EM | Admit: 2021-11-12 | Discharge: 2021-11-12 | Disposition: A | Discharge status: Home / Self Care | Payer: 59 | Source: Home / Self Care

## 2021-11-12 DIAGNOSIS — H5711 Ocular pain, right eye: Secondary | ICD-10-CM | POA: Diagnosis not present

## 2021-11-12 MED ORDER — MOXIFLOXACIN HCL 0.5 % OP SOLN: 1.0000 [drp] | Freq: Three times a day (TID) | OPHTHALMIC | 0 refills | Status: AC

## 2021-11-12 NOTE — Discharge Instructions (Addendum)
Advised patient to use/instill ophthalmic eyedrops as instructed.  Advised may use OTC Blink tears for dry eye for lubricating dry between instillations of Vigamox. Advised/instructed patient to follow-up with her ophthalmologist tomorrow morning, Monday, 11/13/2021 for further evaluation and to check ocular pressure

## 2021-11-12 NOTE — ED Triage Notes (Signed)
Patient presents to Urgent Care with complaints of putting her contact in the right eye last night. She was trying to remove the contact. Wasn't able to see the contact. She did have some right eye discomfort, drainage. Still have some soreness of the right eye. Just had eye vision with glasses on this past Monday. She had recent retina tear.

## 2021-11-12 NOTE — ED Provider Notes (Signed)
Danielle Braun CARE    CSN: [id number] Arrival date & time: 11/12/21  0843      History   Chief Complaint Chief Complaint  Patient presents with   Eye Problem    Right eye discomfort    HPI Danielle Braun is a 61 y.o. female.   HPI 61 year old female presents with right eye discomfort.  Reports attempting to remove right eye contact last night when pain began.  Patient is currently wearing glasses and reports recent retinacular tear of left eye.  Past Medical History:  Diagnosis Date   Acne    Arrhythmia    Bursitis, subacromial    Left   Endometriosis    GERD (gastroesophageal reflux disease)    Retinal tear    Sinusitis     Patient Active Problem List   Diagnosis Date Noted   ANKLE SPRAIN, LEFT 04/10/2009   ACUTE MAXILLARY SINUSITIS 11/06/2008   URI 11/06/2008    Past Surgical History:  Procedure Laterality Date   ABDOMINAL HYSTERECTOMY     ABDOMINAL SURGERY     csection   DILATION AND CURETTAGE OF UTERUS     SHOULDER ARTHROSCOPY WITH SUBACROMIAL DECOMPRESSION Left 10/31/2017   Procedure: SHOULDER ARTHROSCOPY WITH BURSECTOMY AND SUBACROMIAL DECOMPRESSION;  Surgeon: Tania Ade, MD;  Location: [place];  Service: Orthopedics;  Laterality: Left;  Left shoulder arthroscopy bursectomy and subacromial decompression   TONSILLECTOMY      OB History   No obstetric history on file.      Home Medications    Prior to Admission medications   Medication Sig Start Date End Date Taking? Authorizing Provider  acetaminophen (TYLENOL) 500 MG tablet Take 1,000 mg by mouth every 6 (six) hours as needed for moderate pain or headache.   Yes [provider]  Cholecalciferol (VITAMIN D3) 2000 units TABS Take 2,000 Units by mouth daily.   Yes [provider]  diphenhydrAMINE (BENADRYL) 25 mg capsule Take 25 mg by mouth at bedtime as needed.   Yes [provider]  famotidine (PEPCID) 20 MG tablet Take 1 tablet by mouth 2 (two) times daily.  01/18/21  Yes [provider]  fexofenadine (ALLEGRA) 60 MG tablet Take 60 mg by mouth daily as needed.    Yes [provider]  fluticasone (FLONASE) 50 MCG/ACT nasal spray USE 2 SPRAYS IN [facility] NOSTRIL ONCE A DAY 01/18/21 01/18/22 Yes Jefm Petty, MD  montelukast (SINGULAIR) 10 MG tablet Take 1 tablet (10 mg total) by mouth daily. 11/09/21  Yes   moxifloxacin (VIGAMOX) 0.5 % ophthalmic solution Place 1 drop into the right eye 3 (three) times daily for 5 days. 11/12/21 11/17/21 Yes Eliezer Lofts, FNP  pseudoephedrine (SUDAFED) 120 MG 12 hr tablet Take 120 mg by mouth daily as needed.    Yes [provider]  traMADol (ULTRAM) 50 MG tablet Take 50 mg by mouth 2 (two) times daily as needed for moderate pain.    Yes [provider]  venlafaxine XR (EFFEXOR-XR) 37.5 MG 24 hr capsule Take 1 capsule by mouth daily. 01/18/21  Yes [provider]  benzonatate (TESSALON) 200 MG capsule Take 1 capsule (200 mg total) by mouth 3 (three) times daily as needed for cough. 08/06/21   Raylene Everts, MD  COVID-19 mRNA bivalent vaccine, Moderna, (MODERNA COVID-19 BIVAL BOOSTER) 50 MCG/0.5ML injection Inject into the muscle. 07/28/21   Carlyle Basques, MD  cyclobenzaprine (FLEXERIL) 5 MG tablet Take 5 mg by mouth at bedtime as needed for muscle spasms.  [provider]  cyclobenzaprine (FLEXERIL) 5 MG tablet TAKE 1 TABLET BY MOUTH DAILY AS NEEDED. 01/18/21 01/18/22  Jefm Petty, MD  dextromethorphan-guaiFENesin [facility] DM) 30-600 MG 12hr tablet Take 1 tablet by mouth 2 (two) times daily.    [provider]  diclofenac sodium (VOLTAREN) 1 % GEL Apply 1 application topically 4 (four) times daily as needed (for pain).    [provider]  esomeprazole (NEXIUM) 20 MG capsule Take 20 mg by mouth daily.     [provider]  estradiol (ESTRACE) 0.1 MG/GM vaginal cream Place 1 Applicatorful vaginally at bedtime.    [provider]   famotidine (PEPCID) 20 MG tablet TAKE 1 TABLET BY MOUTH 2 TIMES DAILY 01/18/21 01/18/22  Jefm Petty, MD  famotidine (PEPCID) 20 MG tablet TAKE 1 TABLET BY MOUTH TWICE DAILY 01/18/20 01/17/21  Jefm Petty, MD  fluticasone [facility]) 50 MCG/ACT nasal spray Place 2 sprays into both nostrils daily.     [provider]  fluticasone [facility]enc[facility]) 50 MCG/ACT nasal spray USE 2 SPRAYS IN [facility] NOSTRIL ONCE A DAY 01/18/20 01/17/21  Jefm Petty, MD  meloxicam (MOBIC) 7.5 MG tablet Take 15 mg by mouth daily.    [provider]  montelukast (SINGULAIR) 10 MG tablet Take 1 tablet (10 mg total) by mouth at bedtime. 08/06/21   Raylene Everts, MD  PEG 3350-KCl-NaBcb-NaCl-NaSulf (PEG-3350/ELECTROLYTES) 236 g SOLR Use as directed 05/03/21     predniSONE (DELTASONE) 20 MG tablet Take 1 tablet (20 mg total) by mouth 2 (two) times daily with a meal. 08/17/21   Raylene Everts, MD  ranitidine (ZANTAC) 150 MG capsule Take 150 mg by mouth 2 (two) times daily.    [provider]  venlafaxine XR (EFFEXOR-XR) 37.5 MG 24 hr capsule TAKE 1 CAPSULE BY MOUTH ONCE A DAY 01/18/21 01/21/22  Jefm Petty, MD    Family History Family History  Problem Relation Age of Onset   Hyperlipidemia Mother    Cancer Father        bladder   Diabetes Brother    Breast cancer Neg Hx     Social History Social History   Tobacco Use   Smoking status: Former    Types: Cigarettes   Smokeless tobacco: Never  Vaping Use   Vaping Use: Never used  Substance Use Topics   Alcohol use: Yes    Comment: occasional   Drug use: No     Allergies   Patient has no known allergies.   Review of Systems Review of Systems  Eyes:  Positive for pain.    Physical Exam Triage Vital Signs ED Triage Vitals [11/12/21 0905]  Enc Vitals Group     BP      Pulse      Resp      Temp      Temp src      SpO2      Weight      Height      Head Circumference      Peak Flow      Pain Score 6     Pain Loc       Pain Edu?      Excl. in [place]?    No data found.  Updated Vital Signs BP (!) 144/99 (BP Location: Left Arm)    Pulse 68    Temp 98 F (36.7 C) (Oral)    Resp 14    Wt 188 lb (85.3 kg)    SpO2 99%  BMI 30.34 kg/m      Physical Exam Vitals and nursing note reviewed.  Constitutional:      General: She is not in acute distress.    Appearance: Normal appearance. She is obese. She is not ill-appearing.  HENT:     Head: Normocephalic and atraumatic.     Mouth/Throat:     Mouth: Mucous membranes are moist.     Pharynx: Oropharynx is clear.  Eyes:     Extraocular Movements: Extraocular movements intact.     Conjunctiva/sclera: Conjunctivae normal.     Pupils: Pupils are equal, round, and reactive to light.  Cardiovascular:     Rate and Rhythm: Normal rate and regular rhythm.     Pulses: Normal pulses.     Heart sounds: Normal heart sounds.  Pulmonary:     Effort: Pulmonary effort is normal.     Breath sounds: Normal breath sounds.  Musculoskeletal:     Cervical back: Normal range of motion and neck supple.  Skin:    General: Skin is warm and dry.  Neurological:     General: No focal deficit present.     Mental Status: She is alert and oriented to person, place, and time.     UC Treatments / Results  Labs (all labs ordered are listed, but only abnormal results are displayed) Labs Reviewed - No data to display  EKG   Radiology No results found.  Procedures Procedures (including critical care time)  Medications Ordered in UC Medications - No data to display  Initial Impression / Assessment and Plan / UC Course  I have reviewed the triage vital signs and the nursing notes.  Pertinent labs & imaging results that were available during my care of the patient were reviewed by me and considered in my medical decision making (see chart for details).     MDM: 1.  Right eye pain-Rx'd Vigamox. Advised patient to use/instill ophthalmic eyedrops as instructed.  Advised may  use OTC Blink tears for dry eye for lubricating dry between instillations of Vigamox. Advised/instructed patient to follow-up with her ophthalmologist tomorrow morning, Monday, 11/13/2021 for further evaluation and to check ocular pressure.  Patient discharged home, hemodynamically stable Final Clinical Impressions(s) / UC Diagnoses   Final diagnoses:  Eye pain, right     Discharge Instructions      Advised patient to use/instill ophthalmic eyedrops as instructed.  Advised may use OTC Blink tears for dry eye for lubricating dry between instillations of Vigamox. Advised/instructed patient to follow-up with her ophthalmologist tomorrow morning, Monday, 11/13/2021 for further evaluation and to check ocular pressure     ED Prescriptions     Medication Sig Dispense Auth. Provider   moxifloxacin (VIGAMOX) 0.5 % ophthalmic solution Place 1 drop into the right eye 3 (three) times daily for 5 days. 3 mL Eliezer Lofts, FNP      PDMP not reviewed this encounter.   Eliezer Lofts, [place] 11/12/21 [phone]

## 2021-11-17 ENCOUNTER — Other Ambulatory Visit (HOSPITAL_COMMUNITY): Payer: Self-pay

## 2021-11-17 MED FILL — Fluticasone Propionate Nasal Susp 50 MCG/ACT: NASAL | 90 days supply | Qty: 48 | Fill #1 | Status: AC

## 2021-11-18 ENCOUNTER — Other Ambulatory Visit (HOSPITAL_COMMUNITY): Payer: Self-pay

## 2021-11-18 MED ORDER — CARESTART COVID-19 HOME TEST VI KIT
PACK | 0 refills | Status: DC
  Filled 2021-11-18: qty 2, 4d supply, fill #0

## 2021-11-22 ENCOUNTER — Other Ambulatory Visit: Payer: Self-pay

## 2021-11-22 ENCOUNTER — Encounter: Payer: Self-pay | Admitting: Podiatry

## 2021-11-22 ENCOUNTER — Ambulatory Visit: Payer: 59 | Admitting: Podiatry

## 2021-11-22 ENCOUNTER — Ambulatory Visit (INDEPENDENT_AMBULATORY_CARE_PROVIDER_SITE_OTHER): Payer: 59

## 2021-11-22 DIAGNOSIS — M9271 Juvenile osteochondrosis of metatarsus, right foot: Secondary | ICD-10-CM

## 2021-11-22 NOTE — Progress Notes (Signed)
Subjective:  Patient ID: Danielle Braun, female    DOB: April 21, 1961,  MRN: [id number]  Chief Complaint  Patient presents with   Toe Pain    Right foot 3rd toe    61 y.o. female presents with the above complaint.  Patient presents with complaint of right third metatarsophalangeal joint pain.  Patient states is painful to touch painful to walk on.  Her primary care physician told x-ray which showed avascular necrosis of third metatarsal head.  She states that it has progressed gotten worse there are some swelling associated with it.  Pain scale is 7 out of 10 hurts with ambulation she works at [employer] is constantly on her foot.  She denies any other acute complaints.  She has not seen anyone else prior to seeing me.   Review of Systems: Negative except as noted in the HPI. Denies N/V/F/Ch.  Past Medical History:  Diagnosis Date   Acne    Arrhythmia    Bursitis, subacromial    Left   Endometriosis    GERD (gastroesophageal reflux disease)    Retinal tear    Sinusitis     Current Outpatient Medications:    acetaminophen (TYLENOL) 500 MG tablet, Take 1,000 mg by mouth every 6 (six) hours as needed for moderate pain or headache., Disp: , Rfl:    benzonatate (TESSALON) 200 MG capsule, Take 1 capsule (200 mg total) by mouth 3 (three) times daily as needed for cough., Disp: 30 capsule, Rfl: 0   Cholecalciferol (VITAMIN D3) 2000 units TABS, Take 2,000 Units by mouth daily., Disp: , Rfl:    COVID-19 At Home Antigen Test (CARESTART COVID-19 HOME TEST) KIT, Use as directed, Disp: 2 each, Rfl: 0   COVID-19 mRNA bivalent vaccine, Moderna, (MODERNA COVID-19 BIVAL BOOSTER) 50 MCG/0.5ML injection, Inject into the muscle., Disp: 0.5 mL, Rfl: 0   cyclobenzaprine (FLEXERIL) 5 MG tablet, Take 5 mg by mouth at bedtime as needed for muscle spasms. , Disp: , Rfl:    cyclobenzaprine (FLEXERIL) 5 MG tablet, TAKE 1 TABLET BY MOUTH DAILY AS NEEDED., Disp: 30 tablet, Rfl: 2   dextromethorphan-guaiFENesin  (MUCINEX DM) 30-600 MG 12hr tablet, Take 1 tablet by mouth 2 (two) times daily., Disp: , Rfl:    diclofenac sodium (VOLTAREN) 1 % GEL, Apply 1 application topically 4 (four) times daily as needed (for pain)., Disp: , Rfl:    diphenhydrAMINE (BENADRYL) 25 mg capsule, Take 25 mg by mouth at bedtime as needed., Disp: , Rfl:    esomeprazole (NEXIUM) 20 MG capsule, Take 20 mg by mouth daily. , Disp: , Rfl:    estradiol (ESTRACE) 0.1 MG/GM vaginal cream, Place 1 Applicatorful vaginally at bedtime., Disp: , Rfl:    famotidine (PEPCID) 20 MG tablet, Take 1 tablet by mouth 2 (two) times daily., Disp: , Rfl:    famotidine (PEPCID) 20 MG tablet, TAKE 1 TABLET BY MOUTH 2 TIMES DAILY, Disp: 180 tablet, Rfl: 3   famotidine (PEPCID) 20 MG tablet, TAKE 1 TABLET BY MOUTH TWICE DAILY, Disp: 180 tablet, Rfl: 3   fexofenadine (ALLEGRA) 60 MG tablet, Take 60 mg by mouth daily as needed. , Disp: , Rfl:    fluticasone (FLONASE) 50 MCG/ACT nasal spray, Place 2 sprays into both nostrils daily. , Disp: , Rfl:    fluticasone (FLONASE) 50 MCG/ACT nasal spray, USE 2 SPRAYS IN EACH NOSTRIL ONCE A DAY, Disp: 48 g, Rfl: 3   fluticasone (FLONASE) 50 MCG/ACT nasal spray, USE 2 SPRAYS IN EACH NOSTRIL ONCE  A DAY, Disp: 48 g, Rfl: 3   meloxicam (MOBIC) 7.5 MG tablet, Take 15 mg by mouth daily., Disp: , Rfl:    montelukast (SINGULAIR) 10 MG tablet, Take 1 tablet (10 mg total) by mouth at bedtime., Disp: 30 tablet, Rfl: 1   montelukast (SINGULAIR) 10 MG tablet, Take 1 tablet (10 mg total) by mouth daily., Disp: 90 tablet, Rfl: 1   PEG 3350-KCl-NaBcb-NaCl-NaSulf (PEG-3350/ELECTROLYTES) 236 g SOLR, Use as directed, Disp: 4000 mL, Rfl: 0   predniSONE (DELTASONE) 20 MG tablet, Take 1 tablet (20 mg total) by mouth 2 (two) times daily with a meal., Disp: 10 tablet, Rfl: 0   pseudoephedrine (SUDAFED) 120 MG 12 hr tablet, Take 120 mg by mouth daily as needed. , Disp: , Rfl:    ranitidine (ZANTAC) 150 MG capsule, Take 150 mg by mouth 2 (two)  times daily., Disp: , Rfl:    traMADol (ULTRAM) 50 MG tablet, Take 50 mg by mouth 2 (two) times daily as needed for moderate pain. , Disp: , Rfl:    venlafaxine XR (EFFEXOR-XR) 37.5 MG 24 hr capsule, Take 1 capsule by mouth daily., Disp: , Rfl:    venlafaxine XR (EFFEXOR-XR) 37.5 MG 24 hr capsule, TAKE 1 CAPSULE BY MOUTH ONCE A DAY, Disp: 90 capsule, Rfl: 3  Social History   Tobacco Use  Smoking Status Former   Types: Cigarettes  Smokeless Tobacco Never    No Known Allergies Objective:  There were no vitals filed for this visit. There is no height or weight on file to calculate BMI. Constitutional Well developed. Well nourished.  Vascular Dorsalis pedis pulses palpable bilaterally. Posterior tibial pulses palpable bilaterally. Capillary refill normal to all digits.  No cyanosis or clubbing noted. Pedal hair growth normal.  Neurologic Normal speech. Oriented to person, place, and time. Epicritic sensation to light touch grossly present bilaterally.  Dermatologic Nails well groomed and normal in appearance. No open wounds. No skin lesions.  Orthopedic: Pain on palpation of right third metatarsophalangeal joint.  Negative Mulder's click noted.  Pain with range of motion of the third MTPJ joint.  Deep intra-articular third MTPJ joint noted.  Negative extensor or flexor tendinitis.  No plantar plate rupture noted.   Radiographs: 3 views of skeletally mature adult right foot: Right third metatarsal head flattening with cystic changes noted consistent with Freiberg's disease dorsal soft tissue edema noted.  Moderate plantar calcaneal spurring noted. Assessment:   1. Right foot pain    Plan:  Patient was evaluated and treated and all questions answered.  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  At this time I will clinically continue to monitor it.  I discussed with her that she may need surgical intervention with Hemi implant replacement if there is no improvement.  She states understanding. -Cam boot was dispensed   No follow-ups on file.

## 2021-12-01 DIAGNOSIS — U071 COVID-19: Secondary | ICD-10-CM | POA: Diagnosis not present

## 2021-12-07 ENCOUNTER — Other Ambulatory Visit (HOSPITAL_COMMUNITY): Payer: Self-pay

## 2021-12-07 MED FILL — Famotidine Tab 20 MG: ORAL | 90 days supply | Qty: 180 | Fill #3 | Status: AC

## 2021-12-15 ENCOUNTER — Telehealth: Payer: 59 | Admitting: Emergency Medicine

## 2021-12-15 DIAGNOSIS — J329 Chronic sinusitis, unspecified: Secondary | ICD-10-CM | POA: Diagnosis not present

## 2021-12-15 MED ORDER — IPRATROPIUM BROMIDE 0.03 % NA SOLN: 2.0000 | Freq: Two times a day (BID) | NASAL | 0 refills | Status: DC

## 2021-12-15 MED ORDER — AMOXICILLIN-POT CLAVULANATE 875-125 MG PO TABS: 1.0000 | ORAL_TABLET | Freq: Two times a day (BID) | ORAL | 0 refills | Status: DC

## 2021-12-15 NOTE — Progress Notes (Signed)
E-Visit for Sinus Problems  We are sorry that you are not feeling well.  Here is how we plan to help!  Based on what you have shared with me it looks like you have sinusitis.  Sinusitis is inflammation and infection in the sinus cavities of the head.  Based on your presentation I believe you most likely have Acute Bacterial Sinusitis.  This is an infection caused by bacteria and is treated with antibiotics. I have prescribed Augmentin 875mg /125mg  one tablet twice daily with food, for 7 days. You may use an oral decongestant such as Mucinex D or if you have glaucoma or high blood pressure use plain Mucinex. Saline nasal spray help and can safely be used as often as needed for congestion.  If you develop worsening sinus pain, fever or notice severe headache and vision changes, or if symptoms are not better after completion of antibiotic, please schedule an appointment with a health care provider.    Augmentin is a better antibiotic to treat sinus infections.  I don't recommend prednisone in sinus infections, but I've prescribed atrovent nasal spray, which can help lessen the congestion better than flonase and other over-the-counter sprays.  Sinus infections are not as easily transmitted as other respiratory infection, however we still recommend that you avoid close contact with loved ones, especially the very young and elderly.  Remember to wash your hands thoroughly throughout the day as this is the number one way to prevent the spread of infection!  Home Care: Only take medications as instructed by your medical team. Complete the entire course of an antibiotic. Do not take these medications with alcohol. A steam or ultrasonic humidifier can help congestion.  You can place a towel over your head and breathe in the steam from hot water coming from a faucet. Avoid close contacts especially the very young and the elderly. Cover your mouth when you cough or sneeze. Always remember to wash your  hands.  Get Help Right Away If: You develop worsening fever or sinus pain. You develop a severe head ache or visual changes. Your symptoms persist after you have completed your treatment plan.  Make sure you Understand these instructions. Will watch your condition. Will get help right away if you are not doing well or get worse.  Thank you for choosing an e-visit.  Your e-visit answers were reviewed by a board certified advanced clinical practitioner to complete your personal care plan. Depending upon the condition, your plan could have included both over the counter or prescription medications.  Please review your pharmacy choice. Make sure the pharmacy is open so you can pick up prescription now. If there is a problem, you may contact your provider through [employer] and have the prescription routed to another pharmacy.  Your safety is important to [place]. If you have drug allergies check your prescription carefully.   For the next 24 hours you can use MyChart to ask questions about today's visit, request a non-urgent call back, or ask for a work or school excuse. You will get an email in the next two days asking about your experience. I hope that your e-visit has been valuable and will speed your recovery.  Approximately 5 minutes was used in reviewing the patient's chart, questionnaire, prescribing medications, and documentation.

## 2021-12-20 ENCOUNTER — Other Ambulatory Visit: Payer: Self-pay

## 2021-12-20 ENCOUNTER — Ambulatory Visit: Payer: 59 | Admitting: Podiatry

## 2021-12-20 DIAGNOSIS — M9271 Juvenile osteochondrosis of metatarsus, right foot: Secondary | ICD-10-CM

## 2021-12-22 NOTE — Progress Notes (Signed)
Subjective:  Patient ID: Danielle Braun, female    DOB: September 17, 1961,  MRN: [id number]  Chief Complaint  Patient presents with   Nail Problem    Nails are purple     61 y.o. female presents with the above complaint.  Patient presents with follow-up of right third metatarsal Freiberg disease.  She states that she is doing well.  Her pain has gone down while being in the boot.  There is some swelling.  She states that there is some tenderness as well.  She would like to discuss next treatment plans.  She denies any other acute complaints.  Review of Systems: Negative except as noted in the HPI. Denies N/V/F/Ch.  Past Medical History:  Diagnosis Date   Acne    Arrhythmia    Bursitis, subacromial    Left   Endometriosis    GERD (gastroesophageal reflux disease)    Retinal tear    Sinusitis     Current Outpatient Medications:    acetaminophen (TYLENOL) 500 MG tablet, Take 1,000 mg by mouth every 6 (six) hours as needed for moderate pain or headache., Disp: , Rfl:    amoxicillin-clavulanate (AUGMENTIN) 875-125 MG tablet, Take 1 tablet by mouth every 12 (twelve) hours., Disp: 14 tablet, Rfl: 0   benzonatate (TESSALON) 200 MG capsule, Take 1 capsule (200 mg total) by mouth 3 (three) times daily as needed for cough., Disp: 30 capsule, Rfl: 0   Cholecalciferol (VITAMIN D3) 2000 units TABS, Take 2,000 Units by mouth daily., Disp: , Rfl:    COVID-19 At Home Antigen Test ([facility] COVID-19 HOME TEST) KIT, Use as directed, Disp: 2 each, Rfl: 0   COVID-19 mRNA bivalent vaccine, Moderna, (MODERNA COVID-19 BIVAL BOOSTER) 50 MCG/0.5ML injection, Inject into the muscle., Disp: 0.5 mL, Rfl: 0   cyclobenzaprine (FLEXERIL) 5 MG tablet, Take 5 mg by mouth at bedtime as needed for muscle spasms. , Disp: , Rfl:    cyclobenzaprine (FLEXERIL) 5 MG tablet, TAKE 1 TABLET BY MOUTH DAILY AS NEEDED., Disp: 30 tablet, Rfl: 2   dextromethorphan-guaiFENesin (MUCINEX DM) 30-600 MG 12hr tablet, Take 1 tablet by mouth  2 (two) times daily., Disp: , Rfl:    diclofenac sodium (VOLTAREN) 1 % GEL, Apply 1 application topically 4 (four) times daily as needed (for pain)., Disp: , Rfl:    diphenhydrAMINE (BENADRYL) 25 mg capsule, Take 25 mg by mouth at bedtime as needed., Disp: , Rfl:    esomeprazole (NEXIUM) 20 MG capsule, Take 20 mg by mouth daily. , Disp: , Rfl:    estradiol (ESTRACE) 0.1 MG/GM vaginal cream, Place 1 Applicatorful vaginally at bedtime., Disp: , Rfl:    famotidine (PEPCID) 20 MG tablet, Take 1 tablet by mouth 2 (two) times daily., Disp: , Rfl:    famotidine (PEPCID) 20 MG tablet, TAKE 1 TABLET BY MOUTH 2 TIMES DAILY, Disp: 180 tablet, Rfl: 3   famotidine (PEPCID) 20 MG tablet, TAKE 1 TABLET BY MOUTH TWICE DAILY, Disp: 180 tablet, Rfl: 3   fexofenadine (ALLEGRA) 60 MG tablet, Take 60 mg by mouth daily as needed. , Disp: , Rfl:    fluticasone (FLONASE) 50 MCG/ACT nasal spray, Place 2 sprays into both nostrils daily. , Disp: , Rfl:    fluticasone (FLONASE) 50 MCG/ACT nasal spray, USE 2 SPRAYS IN EACH NOSTRIL ONCE A DAY, Disp: 48 g, Rfl: 3   fluticasone (FLONASE) 50 MCG/ACT nasal spray, USE 2 SPRAYS IN EACH NOSTRIL ONCE A DAY, Disp: 48 g, Rfl: 3   ipratropium (ATROVENT)  0.03 % nasal spray, Place 2 sprays into both nostrils every 12 (twelve) hours., Disp: 30 mL, Rfl: 0   meloxicam (MOBIC) 7.5 MG tablet, Take 15 mg by mouth daily., Disp: , Rfl:    montelukast (SINGULAIR) 10 MG tablet, Take 1 tablet (10 mg total) by mouth at bedtime., Disp: 30 tablet, Rfl: 1   montelukast (SINGULAIR) 10 MG tablet, Take 1 tablet (10 mg total) by mouth daily., Disp: 90 tablet, Rfl: 1   PEG 3350-KCl-NaBcb-NaCl-NaSulf (PEG-3350/ELECTROLYTES) 236 g SOLR, Use as directed, Disp: 4000 mL, Rfl: 0   predniSONE (DELTASONE) 20 MG tablet, Take 1 tablet (20 mg total) by mouth 2 (two) times daily with a meal., Disp: 10 tablet, Rfl: 0   pseudoephedrine (SUDAFED) 120 MG 12 hr tablet, Take 120 mg by mouth daily as needed. , Disp: , Rfl:     ranitidine (ZANTAC) 150 MG capsule, Take 150 mg by mouth 2 (two) times daily., Disp: , Rfl:    traMADol (ULTRAM) 50 MG tablet, Take 50 mg by mouth 2 (two) times daily as needed for moderate pain. , Disp: , Rfl:    venlafaxine XR (EFFEXOR-XR) 37.5 MG 24 hr capsule, Take 1 capsule by mouth daily., Disp: , Rfl:    venlafaxine XR (EFFEXOR-XR) 37.5 MG 24 hr capsule, TAKE 1 CAPSULE BY MOUTH ONCE A DAY, Disp: 90 capsule, Rfl: 3  Social History   Tobacco Use  Smoking Status Former   Types: Cigarettes  Smokeless Tobacco Never    No Known Allergies Objective:  There were no vitals filed for this visit. There is no height or weight on file to calculate BMI. Constitutional Well developed. Well nourished.  Vascular Dorsalis pedis pulses palpable bilaterally. Posterior tibial pulses palpable bilaterally. Capillary refill normal to all digits.  No cyanosis or clubbing noted. Pedal hair growth normal.  Neurologic Normal speech. Oriented to person, place, and time. Epicritic sensation to light touch grossly present bilaterally.  Dermatologic Nails well groomed and normal in appearance. No open wounds. No skin lesions.  Orthopedic: Pain on palpation of right third metatarsophalangeal joint.  Negative Mulder's click noted.  Pain with range of motion of the third MTPJ joint.  Deep intra-articular third MTPJ joint noted.  Negative extensor or flexor tendinitis.  No plantar plate rupture noted.   Radiographs: 3 views of skeletally mature adult right foot: Right third metatarsal head flattening with cystic changes noted consistent with Freiberg's disease dorsal soft tissue edema noted.  Moderate plantar calcaneal spurring noted. Assessment:   No diagnosis found.  Plan:  Patient was evaluated and treated and all questions answered.  Right third metatarsophalangeal joint Freiberg disease avascular necrosis -All questions or concerns were discussed with the patient in extensive detail -At this time I  believe she would benefit from continuing cam boot immobilization to allow the inflammation surrounding the joint to heal appropriately.  At this time I will clinically continue to monitor it.  I discussed with her that she may need surgical intervention with Hemi implant replacement if there is no improvement.  She states understanding. -Continue using cam boot and transition into regular shoes in 1 week.  Her pain is improving. -If there is no improvement we will discuss MRI and a possible plan for surgical Hemi implant.  No follow-ups on file.

## 2022-01-02 ENCOUNTER — Other Ambulatory Visit (HOSPITAL_COMMUNITY): Payer: Self-pay

## 2022-01-03 ENCOUNTER — Ambulatory Visit: Payer: 59 | Admitting: Podiatry

## 2022-01-03 ENCOUNTER — Other Ambulatory Visit: Payer: Self-pay

## 2022-01-03 DIAGNOSIS — M9271 Juvenile osteochondrosis of metatarsus, right foot: Secondary | ICD-10-CM

## 2022-01-03 MED ORDER — MELOXICAM 7.5 MG PO TABS: 15.0000 mg | ORAL_TABLET | Freq: Every day | ORAL | 0 refills | Status: DC

## 2022-01-03 MED ORDER — METHYLPREDNISOLONE 4 MG PO TBPK: ORAL_TABLET | ORAL | 0 refills | Status: DC

## 2022-01-03 NOTE — Progress Notes (Signed)
?Subjective:  ?Patient ID: Danielle Braun, female    DOB: 12-13-1960,  MRN: [id number] ? ?Chief Complaint  ?Patient presents with  ? Toe Pain  ? ? ?61 y.o. female presents with the above complaint.  Patient presents with follow-up of right third metatarsal Freiberg disease.  She states that she is doing well.  Her pain has gone down considerably.  She has gone into regular shoes as well.  Some tenderness.  She would like to know if she is ready to go to her daughter's wedding. ? ?Review of Systems: Negative except as noted in the HPI. Denies N/V/F/Ch. ? ?Past Medical History:  ?Diagnosis Date  ? Acne   ? Arrhythmia   ? Bursitis, subacromial   ? Left  ? Endometriosis   ? GERD (gastroesophageal reflux disease)   ? Retinal tear   ? Sinusitis   ? ? ?Current Outpatient Medications:  ?  methylPREDNISolone (MEDROL DOSEPAK) 4 MG TBPK tablet, Take as directed, Disp: 21 each, Rfl: 0 ?  acetaminophen (TYLENOL) 500 MG tablet, Take 1,000 mg by mouth every 6 (six) hours as needed for moderate pain or headache., Disp: , Rfl:  ?  amoxicillin-clavulanate (AUGMENTIN) 875-125 MG tablet, Take 1 tablet by mouth every 12 (twelve) hours., Disp: 14 tablet, Rfl: 0 ?  benzonatate (TESSALON) 200 MG capsule, Take 1 capsule (200 mg total) by mouth 3 (three) times daily as needed for cough., Disp: 30 capsule, Rfl: 0 ?  Cholecalciferol (VITAMIN D3) 2000 units TABS, Take 2,000 Units by mouth daily., Disp: , Rfl:  ?  COVID-19 At Home Antigen Test [facility] COVID-19 HOME TEST) KIT, Use as directed, Disp: 2 each, Rfl: 0 ?  COVID-19 mRNA bivalent vaccine, Moderna, (MODERNA COVID-19 BIVAL BOOSTER) 50 MCG/0.5ML injection, Inject into the muscle., Disp: 0.5 mL, Rfl: 0 ?  cyclobenzaprine (FLEXERIL) 5 MG tablet, Take 5 mg by mouth at bedtime as needed for muscle spasms. , Disp: , Rfl:  ?  cyclobenzaprine (FLEXERIL) 5 MG tablet, TAKE 1 TABLET BY MOUTH DAILY AS NEEDED., Disp: 30 tablet, Rfl: 2 ?  dextromethorphan-guaiFENesin (MUCINEX DM) 30-600 MG 12hr  tablet, Take 1 tablet by mouth 2 (two) times daily., Disp: , Rfl:  ?  diclofenac sodium (VOLTAREN) 1 % GEL, Apply 1 application topically 4 (four) times daily as needed (for pain)., Disp: , Rfl:  ?  diphenhydrAMINE (BENADRYL) 25 mg capsule, Take 25 mg by mouth at bedtime as needed., Disp: , Rfl:  ?  esomeprazole (NEXIUM) 20 MG capsule, Take 20 mg by mouth daily. , Disp: , Rfl:  ?  estradiol (ESTRACE) 0.1 MG/GM vaginal cream, Place 1 Applicatorful vaginally at bedtime., Disp: , Rfl:  ?  famotidine (PEPCID) 20 MG tablet, Take 1 tablet by mouth 2 (two) times daily., Disp: , Rfl:  ?  famotidine (PEPCID) 20 MG tablet, TAKE 1 TABLET BY MOUTH 2 TIMES DAILY, Disp: 180 tablet, Rfl: 3 ?  famotidine (PEPCID) 20 MG tablet, TAKE 1 TABLET BY MOUTH TWICE DAILY, Disp: 180 tablet, Rfl: 3 ?  fexofenadine (ALLEGRA) 60 MG tablet, Take 60 mg by mouth daily as needed. , Disp: , Rfl:  ?  fluticasone (FLONASE) 50 MCG/ACT nasal spray, Place 2 sprays into both nostrils daily. , Disp: , Rfl:  ?  fluticasone (FLONASE) 50 MCG/ACT nasal spray, USE 2 SPRAYS IN EACH NOSTRIL ONCE A DAY, Disp: 48 g, Rfl: 3 ?  fluticasone (FLONASE) 50 MCG/ACT nasal spray, USE 2 SPRAYS IN EACH NOSTRIL ONCE A DAY, Disp: 48 g, Rfl: 3 ?  ipratropium (ATROVENT) 0.03 % nasal spray, Place 2 sprays into both nostrils every 12 (twelve) hours., Disp: 30 mL, Rfl: 0 ?  meloxicam (MOBIC) 7.5 MG tablet, Take 2 tablets (15 mg total) by mouth daily., Disp: 60 tablet, Rfl: 0 ?  montelukast (SINGULAIR) 10 MG tablet, Take 1 tablet (10 mg total) by mouth at bedtime., Disp: 30 tablet, Rfl: 1 ?  montelukast (SINGULAIR) 10 MG tablet, Take 1 tablet (10 mg total) by mouth daily., Disp: 90 tablet, Rfl: 1 ?  PEG 3350-KCl-NaBcb-NaCl-NaSulf (PEG-3350/ELECTROLYTES) 236 g SOLR, Use as directed, Disp: 4000 mL, Rfl: 0 ?  predniSONE (DELTASONE) 20 MG tablet, Take 1 tablet (20 mg total) by mouth 2 (two) times daily with a meal., Disp: 10 tablet, Rfl: 0 ?  pseudoephedrine (SUDAFED) 120 MG 12 hr tablet,  Take 120 mg by mouth daily as needed. , Disp: , Rfl:  ?  ranitidine (ZANTAC) 150 MG capsule, Take 150 mg by mouth 2 (two) times daily., Disp: , Rfl:  ?  traMADol (ULTRAM) 50 MG tablet, Take 50 mg by mouth 2 (two) times daily as needed for moderate pain. , Disp: , Rfl:  ?  venlafaxine XR (EFFEXOR-XR) 37.5 MG 24 hr capsule, Take 1 capsule by mouth daily., Disp: , Rfl:  ?  venlafaxine XR (EFFEXOR-XR) 37.5 MG 24 hr capsule, TAKE 1 CAPSULE BY MOUTH ONCE A DAY, Disp: 90 capsule, Rfl: 3 ? ?Social History  ? ?Tobacco Use  ?Smoking Status Former  ? Types: Cigarettes  ?Smokeless Tobacco Never  ? ? ?No Known Allergies ?Objective:  ?There were no vitals filed for this visit. ?There is no height or weight on file to calculate BMI. ?Constitutional Well developed. ?Well nourished.  ?Vascular Dorsalis pedis pulses palpable bilaterally. ?Posterior tibial pulses palpable bilaterally. ?Capillary refill normal to all digits.  ?No cyanosis or clubbing noted. ?Pedal hair growth normal.  ?Neurologic Normal speech. ?Oriented to person, place, and time. ?Epicritic sensation to light touch grossly present bilaterally.  ?Dermatologic Nails well groomed and normal in appearance. ?No open wounds. ?No skin lesions.  ?Orthopedic: No further pain on palpation of right third metatarsophalangeal joint.  Negative Mulder's click noted.  No further pain with range of motion of the third MTPJ joint.  Deep intra-articular third MTPJ joint noted.  Negative extensor or flexor tendinitis.  No plantar plate rupture noted.  ? ?Radiographs: 3 views of skeletally mature adult right foot: Right third metatarsal head flattening with cystic changes noted consistent with Freiberg's disease dorsal soft tissue edema noted.  Moderate plantar calcaneal spurring noted. ?Assessment:  ? ?1. Freiberg's disease, right   ? ? ?Plan:  ?Patient was evaluated and treated and all questions answered. ? ?Right third metatarsophalangeal joint Freiberg disease avascular  necrosis ?-All questions or concerns were discussed with the patient in extensive detail ?-At this time I believe at this time she would benefit from intermittent using cam boot as needed..  At this time I will clinically continue to monitor it.  I discussed with her that she may need surgical intervention with Hemi implant replacement if there is no improvement.  She states understanding. ?-We will transition to regular shoes and continue using the regular shoes.  Her pain has clinically improved considerably as she does not have much pain.  Occasional swelling once in a while. ?-Medrol Dosepak and Mobic was sent for in case if the pain flares up. ?-If there is no improvement we will discuss MRI and a possible plan for surgical Hemi implant. ? ?No follow-ups  on file.  ?

## 2022-01-04 ENCOUNTER — Other Ambulatory Visit (HOSPITAL_COMMUNITY): Payer: Self-pay

## 2022-01-05 ENCOUNTER — Emergency Department
Admission: EM | Admit: 2022-01-05 | Discharge: 2022-01-05 | Disposition: A | Discharge status: Home / Self Care | Payer: 59 | Source: Home / Self Care

## 2022-01-05 ENCOUNTER — Emergency Department (INDEPENDENT_AMBULATORY_CARE_PROVIDER_SITE_OTHER): Payer: 59

## 2022-01-05 ENCOUNTER — Other Ambulatory Visit: Payer: Self-pay

## 2022-01-05 ENCOUNTER — Ambulatory Visit: Payer: 59 | Admitting: Podiatry

## 2022-01-05 ENCOUNTER — Encounter: Payer: Self-pay | Admitting: Emergency Medicine

## 2022-01-05 DIAGNOSIS — M25561 Pain in right knee: Secondary | ICD-10-CM | POA: Diagnosis not present

## 2022-01-05 NOTE — ED Triage Notes (Signed)
Rt knee pain x 1 week, has been in cam boot x 6 weeks for foot pain.  ?

## 2022-01-05 NOTE — ED Provider Notes (Signed)
[name]    CSN: [id number] Arrival date & time: 01/05/22  1425      History   Chief Complaint Chief Complaint  Patient presents with   Knee Pain    HPI Danielle Braun is a 61 y.o. female.   HPI 61 year old female presents with right knee pain for 1 week.  Reports that she has been in a cam walker boot for 6 weeks for right foot pain.  Patient reports podiatry who has been treating her right foot ([place]'s disease of right foot) for the past 6 weeks have written prescribed her Medrol Dosepak and Mobic (wedding cocktail) for her daughter's upcoming wedding in [place] 1 week from now.  Patient reports not starting these 2 medications as of yet.  Past Medical History:  Diagnosis Date   Acne    Arrhythmia    Bursitis, subacromial    Left   Endometriosis    GERD (gastroesophageal reflux disease)    Retinal tear    Sinusitis     Patient Active Problem List   Diagnosis Date Noted   ANKLE SPRAIN, LEFT 04/10/2009   ACUTE MAXILLARY SINUSITIS 11/06/2008   URI 11/06/2008    Past Surgical History:  Procedure Laterality Date   ABDOMINAL HYSTERECTOMY     ABDOMINAL SURGERY     csection   DILATION AND CURETTAGE OF UTERUS     SHOULDER ARTHROSCOPY WITH SUBACROMIAL DECOMPRESSION Left 10/31/2017   Procedure: SHOULDER ARTHROSCOPY WITH BURSECTOMY AND SUBACROMIAL DECOMPRESSION;  Surgeon: Tania Ade, MD;  Location: [facility];  Service: Orthopedics;  Laterality: Left;  Left shoulder arthroscopy bursectomy and subacromial decompression   TONSILLECTOMY      OB History   No obstetric history on file.      Home Medications    Prior to Admission medications   Medication Sig Start Date End Date Taking? Authorizing Provider  acetaminophen (TYLENOL) 500 MG tablet Take 1,000 mg by mouth every 6 (six) hours as needed for moderate pain or headache.    [provider]  Cholecalciferol (VITAMIN D3) 2000 units TABS Take 2,000 Units by mouth daily.    [provider]  COVID-19 At Home Antigen Test [facility] COVID-19 HOME TEST) KIT Use as directed 11/18/21   Jefm Bryant, RPH  COVID-19 mRNA bivalent vaccine, Moderna, (MODERNA COVID-19 BIVAL BOOSTER) 50 MCG/0.5ML injection Inject into the muscle. 07/28/21   Carlyle Basques, MD  cyclobenzaprine (FLEXERIL) 5 MG tablet Take 5 mg by mouth at bedtime as needed for muscle spasms.     [provider]  dextromethorphan-guaiFENesin (MUCINEX DM) 30-600 MG 12hr tablet Take 1 tablet by mouth 2 (two) times daily.    [provider]  diclofenac sodium (VOLTAREN) 1 % GEL Apply 1 application topically 4 (four) times daily as needed (for pain).    [provider]  diphenhydrAMINE (BENADRYL) 25 mg capsule Take 25 mg by mouth at bedtime as needed.    [provider]  esomeprazole (NEXIUM) 20 MG capsule Take 20 mg by mouth daily.     [provider]  estradiol (ESTRACE) 0.1 MG/GM vaginal cream Place 1 Applicatorful vaginally at bedtime.    [provider]  famotidine (PEPCID) 20 MG tablet Take 1 tablet by mouth 2 (two) times daily. 01/18/21   [provider]  famotidine (PEPCID) 20 MG tablet TAKE 1 TABLET BY MOUTH 2 TIMES DAILY 01/18/21 03/07/22  Jefm Petty, MD  famotidine (PEPCID) 20 MG tablet TAKE 1 TABLET BY MOUTH TWICE DAILY 01/18/20 01/17/21  Jefm Petty, MD  fexofenadine (ALLEGRA) 60 MG tablet Take 60 mg by mouth daily as needed.     [provider]  fluticasone (FLONASE) 50 MCG/ACT nasal spray Place 2 sprays into both nostrils daily.     [provider]  fluticasone [facility]) 50 MCG/ACT nasal spray USE 2 SPRAYS IN [facility] NOSTRIL ONCE A DAY 01/18/21 02/18/22  Jefm Petty, MD  fluticasone [facility]) 50 MCG/ACT nasal spray USE 2 SPRAYS IN [facility] NOSTRIL ONCE A DAY 01/18/20 01/17/21  Jefm Petty, MD  ipratropium (ATROVENT) 0.03 % nasal spray Place 2 sprays into both nostrils every 12 (twelve) hours. 12/15/21   Montine Circle, PA-C   meloxicam (MOBIC) 7.5 MG tablet Take 2 tablets (15 mg total) by mouth daily. 01/03/22 02/02/22  Felipa Furnace, DPM  methylPREDNISolone (MEDROL DOSEPAK) 4 MG TBPK tablet Take as directed 01/03/22   Felipa Furnace, DPM  montelukast (SINGULAIR) 10 MG tablet Take 1 tablet (10 mg total) by mouth at bedtime. 08/06/21   Raylene Everts, MD  montelukast (SINGULAIR) 10 MG tablet Take 1 tablet (10 mg total) by mouth daily. 11/09/21     PEG 3350-KCl-NaBcb-NaCl-NaSulf (PEG-3350/ELECTROLYTES) 236 g SOLR Use as directed 05/03/21     pseudoephedrine (SUDAFED) 120 MG 12 hr tablet Take 120 mg by mouth daily as needed.     [provider]  ranitidine (ZANTAC) 150 MG capsule Take 150 mg by mouth 2 (two) times daily.    [provider]  traMADol (ULTRAM) 50 MG tablet Take 50 mg by mouth 2 (two) times daily as needed for moderate pain.     [provider]  venlafaxine XR (EFFEXOR-XR) 37.5 MG 24 hr capsule Take 1 capsule by mouth daily. 01/18/21   [provider]  venlafaxine XR (EFFEXOR-XR) 37.5 MG 24 hr capsule TAKE 1 CAPSULE BY MOUTH ONCE A DAY 01/18/21 01/21/22  Jefm Petty, MD    Family History Family History  Problem Relation Age of Onset   Hyperlipidemia Mother    Cancer Father        bladder   Diabetes Brother    Breast cancer Neg Hx     Social History Social History   Tobacco Use   Smoking status: Former    Types: Cigarettes   Smokeless tobacco: Never  Vaping Use   Vaping Use: Never used  Substance Use Topics   Alcohol use: Yes    Comment: occasional   Drug use: No     Allergies   Patient has no known allergies.   Review of Systems Review of Systems  Musculoskeletal:        Right knee pain x1 week    Physical Exam Triage Vital Signs ED Triage Vitals  Enc Vitals Group     BP 01/05/22 1441 (!) 158/84     Pulse Rate 01/05/22 1441 72     Resp 01/05/22 1441 18     Temp 01/05/22 1441 98.2 F (36.8 C)     Temp Source 01/05/22 1441 Oral     SpO2  01/05/22 1441 99 %     Weight 01/05/22 1442 186 lb (84.4 kg)     Height 01/05/22 1442 _0  (1.676 m)     Head Circumference --      Peak Flow --      Pain Score 01/05/22 1442 7     Pain Loc --      Pain Edu? --      Excl. in GC? --    No  data found.  Updated Vital Signs BP (!) 158/84 (BP Location: Left Arm)    Pulse 72    Temp 98.2 F (36.8 C) (Oral)    Resp 18    Ht _0  (1.676 m)    Wt 186 lb (84.4 kg)    SpO2 99%    BMI 30.02 kg/m    Physical Exam Vitals and nursing note reviewed.  Constitutional:      Appearance: She is normal weight.  HENT:     Head: Normocephalic and atraumatic.     Mouth/Throat:     Mouth: Mucous membranes are moist.     Pharynx: Oropharynx is clear.  Eyes:     Extraocular Movements: Extraocular movements intact.     Conjunctiva/sclera: Conjunctivae normal.     Pupils: Pupils are equal, round, and reactive to light.  Cardiovascular:     Rate and Rhythm: Normal rate and regular rhythm.     Pulses: Normal pulses.     Heart sounds: Normal heart sounds.  Pulmonary:     Effort: Pulmonary effort is normal.     Breath sounds: Normal breath sounds. No wheezing, rhonchi or rales.  Musculoskeletal:     Cervical back: Normal range of motion and neck supple.     Comments: Right knee: Mildly TTP over inferior aspect of patella, negative Lachman's, negative anterior drawer, no deformity noted.  Skin:    General: Skin is warm and dry.  Neurological:     General: No focal deficit present.     Mental Status: She is alert and oriented to person, place, and time.     UC Treatments / Results  Labs (all labs ordered are listed, but only abnormal results are displayed) Labs Reviewed - No data to display  EKG   Radiology DG Knee Complete 4 Views Right  Result Date: 01/05/2022 CLINICAL DATA:  Right knee pain for 1 week.  No trauma. EXAM: RIGHT KNEE - COMPLETE 4+ VIEW COMPARISON:  None. FINDINGS: Mild medial and patellofemoral compartment joint space  narrowing with osteophyte formation. No acute fracture or dislocation. Equivocal small suprapatellar joint effusion. IMPRESSION: Mild osteoarthritis, without acute osseous abnormality. Electronically Signed   By: Abigail Miyamoto M.D.   On: 01/05/2022 15:25    Procedures Procedures (including critical care time)  Medications Ordered in UC Medications - No data to display  Initial Impression / Assessment and Plan / UC Course  I have reviewed the triage vital signs and the nursing notes.  Pertinent labs & imaging results that were available during my care of the patient were reviewed by me and considered in my medical decision making (see chart for details).     MDM: 1.  Anterior knee pain, right-Advised/informed patient of right knee x-ray results today with hard copy provided.  Advised to follow-up with [facility] orthopedic provider (contact information provided above on this AVS) if right knee pain worsens and/or unresolved. Advised patient to take medication as directed from podiatry.  Patient discharged home, hemodynamically stable. Final Clinical Impressions(s) / UC Diagnoses   Final diagnoses:  Anterior knee pain, right     Discharge Instructions      Advised/informed patient of right knee x-ray results today with hard copy provided.  Advised to follow-up with [facility] orthopedic provider (contact information provided above on this AVS) if right knee pain worsens and/or unresolved.  Advised patient to take medication as directed from podiatry.     ED Prescriptions   None    PDMP not  reviewed this encounter.   Eliezer Lofts, [place] 01/05/22 1546

## 2022-01-05 NOTE — Discharge Instructions (Addendum)
Advised/informed patient of right knee x-ray results today with hard copy provided.  Advised to follow-up with [facility] orthopedic provider (contact information provided above on this AVS) if right knee pain worsens and/or unresolved.  Advised patient to take medication as directed from podiatry. ?

## 2022-01-09 ENCOUNTER — Other Ambulatory Visit (HOSPITAL_COMMUNITY): Payer: Self-pay

## 2022-01-09 MED ORDER — VENLAFAXINE HCL ER 37.5 MG PO CP24
ORAL_CAPSULE | ORAL | 0 refills | Status: DC
  Filled 2022-01-09: qty 30, 30d supply, fill #0

## 2022-01-10 ENCOUNTER — Other Ambulatory Visit (HOSPITAL_COMMUNITY): Payer: Self-pay

## 2022-01-17 ENCOUNTER — Encounter: Payer: Self-pay | Admitting: Family Medicine

## 2022-01-17 ENCOUNTER — Ambulatory Visit: Payer: 59 | Admitting: Family Medicine

## 2022-01-17 ENCOUNTER — Ambulatory Visit: Payer: Self-pay

## 2022-01-17 VITALS — BP 130/76 | Ht 66.0 in | Wt 186.0 lb

## 2022-01-17 DIAGNOSIS — M222X1 Patellofemoral disorders, right knee: Secondary | ICD-10-CM | POA: Diagnosis not present

## 2022-01-17 DIAGNOSIS — M25561 Pain in right knee: Secondary | ICD-10-CM

## 2022-01-17 NOTE — Progress Notes (Signed)
?  Danielle Braun - 61 y.o. female MRN [id number]  Date of birth: [birth year] ? ?SUBJECTIVE:  Including CC & ROS.  ?No chief complaint on file. ? ? ?Danielle Braun is a 61 y.o. female that is presenting with right knee pain.  The pain has been ongoing for the past few weeks.  She was in a cam walker for her right foot.  The pain in the knee was anterior nature.  No injury.  No history of surgery. ? ?Review of the urgent care note from 3/10 shows she was counseled supportive care. ?Independent review of the right knee x-ray from 3/10 shows mild degenerative changes in the medial compartment. ? ? ?Review of Systems ?See HPI  ? ?HISTORY: Past Medical, Surgical, Social, and Family History Reviewed & Updated per EMR.   ?Pertinent Historical Findings include: ? ?Past Medical History:  ?Diagnosis Date  ? Acne   ? Arrhythmia   ? Bursitis, subacromial   ? Left  ? Endometriosis   ? GERD (gastroesophageal reflux disease)   ? Retinal tear   ? Sinusitis   ? ? ?Past Surgical History:  ?Procedure Laterality Date  ? ABDOMINAL HYSTERECTOMY    ? ABDOMINAL SURGERY    ? csection  ? DILATION AND CURETTAGE OF UTERUS    ? SHOULDER ARTHROSCOPY WITH SUBACROMIAL DECOMPRESSION Left 10/31/2017  ? Procedure: SHOULDER ARTHROSCOPY WITH BURSECTOMY AND SUBACROMIAL DECOMPRESSION;  Surgeon: Tania Ade, MD;  Location: [facility];  Service: Orthopedics;  Laterality: Left;  Left shoulder arthroscopy bursectomy and subacromial decompression  ? TONSILLECTOMY    ? ? ? ?PHYSICAL EXAM:  ?VS: BP 130/76 (BP Location: Right Arm, Patient Position: Sitting)   Ht '5\' 6"'$  (1.676 m)   Wt 186 lb (84.4 kg)   BMI 30.02 kg/m?  ?Physical Exam ?Gen: NAD, alert, cooperative with exam, well-appearing ?MSK:  ?Neurovascularly intact   ? ?Limited ultrasound: Right knee: ? ?No effusion in the suprapatellar pouch. ?Normal-appearing quadricep and patellar tendon. ?Mild degenerative changes of the meniscus in the medial compartment. ?Normal-appearing lateral joint space. ? ?Summary:  No significant changes appreciated. ? ?Ultrasound and interpretation by Clearance Coots, MD ? ? ? ?ASSESSMENT & PLAN:  ? ?Patellofemoral pain syndrome of right knee ?Acutely occurring.  Pain likely the result of abnormal motion due to wearing a cam walker on the right foot. ?-Counseled on home exercise therapy and supportive care. ?-Green sport insoles. ?-Could consider custom orthotics or physical therapy. ? ? ? ? ?

## 2022-01-17 NOTE — Assessment & Plan Note (Signed)
Acutely occurring.  Pain likely the result of abnormal motion due to wearing a cam walker on the right foot. ?-Counseled on home exercise therapy and supportive care. ?-Green sport insoles. ?-Could consider custom orthotics or physical therapy. ?

## 2022-01-17 NOTE — Patient Instructions (Signed)
Nice to meet you ?Please try ice as needed  ?Please try the exercises  ?Please try the insoles   ?Please send me a message in MyChart with any questions or updates.  ?Please see me back in 4 weeks.  ? ?--Dr. Raeford Razor ? ?

## 2022-01-24 DIAGNOSIS — Z6832 Body mass index (BMI) 32.0-32.9, adult: Secondary | ICD-10-CM | POA: Diagnosis not present

## 2022-01-24 DIAGNOSIS — E7800 Pure hypercholesterolemia, unspecified: Secondary | ICD-10-CM | POA: Diagnosis not present

## 2022-01-24 DIAGNOSIS — M9271 Juvenile osteochondrosis of metatarsus, right foot: Secondary | ICD-10-CM | POA: Diagnosis not present

## 2022-01-24 DIAGNOSIS — Z0000 Encounter for general adult medical examination without abnormal findings: Secondary | ICD-10-CM | POA: Diagnosis not present

## 2022-01-24 DIAGNOSIS — E669 Obesity, unspecified: Secondary | ICD-10-CM | POA: Diagnosis not present

## 2022-01-24 DIAGNOSIS — R6889 Other general symptoms and signs: Secondary | ICD-10-CM | POA: Diagnosis not present

## 2022-01-24 DIAGNOSIS — E559 Vitamin D deficiency, unspecified: Secondary | ICD-10-CM | POA: Diagnosis not present

## 2022-01-24 DIAGNOSIS — K219 Gastro-esophageal reflux disease without esophagitis: Secondary | ICD-10-CM | POA: Diagnosis not present

## 2022-01-24 DIAGNOSIS — J302 Other seasonal allergic rhinitis: Secondary | ICD-10-CM | POA: Diagnosis not present

## 2022-01-26 ENCOUNTER — Other Ambulatory Visit (HOSPITAL_COMMUNITY): Payer: Self-pay

## 2022-01-26 DIAGNOSIS — M9271 Juvenile osteochondrosis of metatarsus, right foot: Secondary | ICD-10-CM | POA: Diagnosis not present

## 2022-01-26 DIAGNOSIS — E7800 Pure hypercholesterolemia, unspecified: Secondary | ICD-10-CM | POA: Diagnosis not present

## 2022-01-26 DIAGNOSIS — K219 Gastro-esophageal reflux disease without esophagitis: Secondary | ICD-10-CM | POA: Diagnosis not present

## 2022-01-26 DIAGNOSIS — E669 Obesity, unspecified: Secondary | ICD-10-CM | POA: Diagnosis not present

## 2022-01-26 DIAGNOSIS — E559 Vitamin D deficiency, unspecified: Secondary | ICD-10-CM | POA: Diagnosis not present

## 2022-01-26 DIAGNOSIS — G8929 Other chronic pain: Secondary | ICD-10-CM | POA: Diagnosis not present

## 2022-01-26 DIAGNOSIS — J302 Other seasonal allergic rhinitis: Secondary | ICD-10-CM | POA: Diagnosis not present

## 2022-01-26 DIAGNOSIS — Z0000 Encounter for general adult medical examination without abnormal findings: Secondary | ICD-10-CM | POA: Diagnosis not present

## 2022-01-26 DIAGNOSIS — R6889 Other general symptoms and signs: Secondary | ICD-10-CM | POA: Diagnosis not present

## 2022-01-26 DIAGNOSIS — M25512 Pain in left shoulder: Secondary | ICD-10-CM | POA: Diagnosis not present

## 2022-01-26 MED ORDER — VENLAFAXINE HCL ER 37.5 MG PO CP24
37.5000 mg | ORAL_CAPSULE | Freq: Every day | ORAL | 1 refills | Status: DC
  Filled 2022-02-06: qty 90, 90d supply, fill #0
  Filled 2022-05-06: qty 90, 90d supply, fill #1

## 2022-01-26 MED ORDER — FLUTICASONE PROPIONATE 50 MCG/ACT NA SUSP
2.0000 | Freq: Every day | NASAL | 3 refills | Status: DC
  Filled 2022-02-19: qty 48, 90d supply, fill #0
  Filled 2022-05-23: qty 48, 90d supply, fill #1
  Filled 2022-08-31: qty 48, 90d supply, fill #2

## 2022-01-26 MED ORDER — FAMOTIDINE 20 MG PO TABS
20.0000 mg | ORAL_TABLET | Freq: Two times a day (BID) | ORAL | 3 refills | Status: DC
  Filled 2022-01-26 – 2022-03-22 (×2): qty 180, 90d supply, fill #0
  Filled 2022-06-14: qty 180, 90d supply, fill #1
  Filled 2022-09-27: qty 180, 90d supply, fill #2

## 2022-01-29 ENCOUNTER — Encounter: Payer: Self-pay | Admitting: Family Medicine

## 2022-01-31 ENCOUNTER — Other Ambulatory Visit: Payer: Self-pay | Admitting: Podiatry

## 2022-02-06 ENCOUNTER — Other Ambulatory Visit (HOSPITAL_COMMUNITY): Payer: Self-pay

## 2022-02-19 ENCOUNTER — Ambulatory Visit: Payer: 59 | Admitting: Family Medicine

## 2022-02-19 ENCOUNTER — Other Ambulatory Visit (HOSPITAL_COMMUNITY): Payer: Self-pay

## 2022-02-19 VITALS — BP 120/70 | Ht 65.5 in | Wt 184.7 lb

## 2022-02-19 DIAGNOSIS — M222X1 Patellofemoral disorders, right knee: Secondary | ICD-10-CM | POA: Diagnosis not present

## 2022-02-19 NOTE — Progress Notes (Signed)
?  Danielle Braun - 61 y.o. female MRN [id number]  Date of birth: 05/18/1961 ? ?SUBJECTIVE:  Including CC & ROS.  ?No chief complaint on file. ? ? ?JENASCIA BUMPASS is a 62 y.o. female that is following up for her right knee pain.  She has been doing well with the home exercises.  Pain is improving and she is having more mobility. ? ? ?Review of Systems ?See HPI  ? ?HISTORY: Past Medical, Surgical, Social, and Family History Reviewed & Updated per EMR.   ?Pertinent Historical Findings include: ? ?Past Medical History:  ?Diagnosis Date  ? Acne   ? Arrhythmia   ? Bursitis, subacromial   ? Left  ? Endometriosis   ? GERD (gastroesophageal reflux disease)   ? Retinal tear   ? Sinusitis   ? ? ?Past Surgical History:  ?Procedure Laterality Date  ? ABDOMINAL HYSTERECTOMY    ? ABDOMINAL SURGERY    ? csection  ? DILATION AND CURETTAGE OF UTERUS    ? SHOULDER ARTHROSCOPY WITH SUBACROMIAL DECOMPRESSION Left 10/31/2017  ? Procedure: SHOULDER ARTHROSCOPY WITH BURSECTOMY AND SUBACROMIAL DECOMPRESSION;  Surgeon: Tania Ade, MD;  Location: [facility];  Service: Orthopedics;  Laterality: Left;  Left shoulder arthroscopy bursectomy and subacromial decompression  ? TONSILLECTOMY    ? ? ? ?PHYSICAL EXAM:  ?VS: BP 120/70   Ht 5' 5.5" (1.664 m)   Wt 184 lb 11.2 oz (83.8 kg)   BMI 30.27 kg/m?  ?Physical Exam ?Gen: NAD, alert, cooperative with exam, well-appearing ?MSK:  ?Neurovascularly intact   ? ? ? ? ?ASSESSMENT & PLAN:  ? ?Patellofemoral pain syndrome of right knee ?Doing well with home exercises. ?-Counseled on home exercise therapy and supportive care. ?-Referral to physical therapy. ?-Could consider further imaging or injection.  ? ? ? ? ?

## 2022-02-19 NOTE — Assessment & Plan Note (Signed)
Doing well with home exercises. ?-Counseled on home exercise therapy and supportive care. ?-Referral to physical therapy. ?-Could consider further imaging or injection.  ?

## 2022-03-05 ENCOUNTER — Encounter: Payer: Self-pay | Admitting: Physical Therapy

## 2022-03-05 ENCOUNTER — Ambulatory Visit: Payer: 59 | Attending: Family Medicine | Admitting: Physical Therapy

## 2022-03-05 DIAGNOSIS — M25661 Stiffness of right knee, not elsewhere classified: Secondary | ICD-10-CM | POA: Diagnosis not present

## 2022-03-05 DIAGNOSIS — M25561 Pain in right knee: Secondary | ICD-10-CM | POA: Insufficient documentation

## 2022-03-05 DIAGNOSIS — M222X1 Patellofemoral disorders, right knee: Secondary | ICD-10-CM | POA: Insufficient documentation

## 2022-03-05 DIAGNOSIS — R252 Cramp and spasm: Secondary | ICD-10-CM | POA: Insufficient documentation

## 2022-03-05 DIAGNOSIS — R262 Difficulty in walking, not elsewhere classified: Secondary | ICD-10-CM | POA: Diagnosis not present

## 2022-03-05 NOTE — Therapy (Signed)
?OUTPATIENT PHYSICAL THERAPY LOWER EXTREMITY EVALUATION ? ? ?Patient Name: Danielle Braun ?MRN: [id number] ?DOB:May 30, 1961, 61 y.o., female ?Today's Date: 03/05/2022 ? ? PT End of Session - 03/05/22 1121   ? ? Visit Number 1   ? Number of Visits 7   ? Date for PT Re-Evaluation 04/16/22   ? Authorization Type [facility] VL 25   ? Progress Note Due on Visit 7   ? PT Start Time 0940   ? PT Stop Time 1017   ? PT Time Calculation (min) 37 min   ? Activity Tolerance Patient tolerated treatment well   ? Behavior During Therapy [facility] for tasks assessed/performed   ? ?  ?  ? ?  ? ? ?Past Medical History:  ?Diagnosis Date  ? Acne   ? Arrhythmia   ? Bursitis, subacromial   ? Left  ? Endometriosis   ? GERD (gastroesophageal reflux disease)   ? Retinal tear   ? Sinusitis   ? ?Past Surgical History:  ?Procedure Laterality Date  ? ABDOMINAL HYSTERECTOMY    ? ABDOMINAL SURGERY    ? csection  ? DILATION AND CURETTAGE OF UTERUS    ? SHOULDER ARTHROSCOPY WITH SUBACROMIAL DECOMPRESSION Left 10/31/2017  ? Procedure: SHOULDER ARTHROSCOPY WITH BURSECTOMY AND SUBACROMIAL DECOMPRESSION;  Surgeon: Tania Ade, MD;  Location: [place];  Service: Orthopedics;  Laterality: Left;  Left shoulder arthroscopy bursectomy and subacromial decompression  ? TONSILLECTOMY    ? ?Patient Active Problem List  ? Diagnosis Date Noted  ? Patellofemoral pain syndrome of right knee 01/17/2022  ? ? ?PCP: Jefm Petty, MD ? ?REFERRING PROVIDER: Rosemarie Ax MD ? ?REFERRING DIAG: M22.2X1 (ICD-10-CM) - Patellofemoral pain syndrome of right knee  ? ?THERAPY DIAG:  ?Acute pain of right knee ? ?Stiffness of right knee, not elsewhere classified ? ?Cramp and spasm ? ?Difficulty in walking, not elsewhere classified ? ?ONSET DATE: 12/29/2021 ? ?SUBJECTIVE:  ? ?SUBJECTIVE STATEMENT: ?Patient reports she has had R knee pain due to wearing CAM walker for foot pain.  She went to ED on 01/05/22 but pain started the week before, concerned because was traveling for wedding and  worried about damage.  X-ray was unremarkable.  Was told she needed to strengthen quad and was given exercises, which have helped but still not back to normal so asked to go to PT.  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  ? ?PERTINENT HISTORY: ?Scoliosis,  L SAD 2019, abdominal surgery; Frieburgs disease R foot (AVN of metatarsal), OA R knee ? ?PAIN:  ?Are you having pain?  No pain currently, just a twinge.   ? ?PRECAUTIONS: None ? ?WEIGHT BEARING RESTRICTIONS No ? ?FALLS:  ?Has patient fallen in last 6 months? No ? ?LIVING ENVIRONMENT: ?Lives with: lives with their spouse ?Lives in: House/apartment ?Stairs: No ?Has following equipment at home: None ? ?OCCUPATION: ICU nurse  ? ?PLOF: Independent ? ?PATIENT GOALS Strengthen knee without damaging it be able to exercise.  ? ? ?OBJECTIVE:  ? ?DIAGNOSTIC FINDINGS: From MD note: Independent review of the right knee x-ray from 3/10 shows mild degenerative changes in the medial compartment. ? ?PATIENT SURVEYS:  ?FOTO 62% knee.  Predicted outcome 76% ? ?COGNITION: ? Overall cognitive status: Within functional limits for tasks assessed   ?  ?SENSATION: ?No differences reported.  ? ?MUSCLE LENGTH: ?Hamstrings: Right 140 deg; Left 140 deg ?Quadriceps: Right 60 deg; Left 65 deg ? ?POSTURE:  ?WNL ? ?PALPATION: ?Tenderness R LCL and pes anserine ? ?LE ROM: ? ?Active ROM  Right ?03/05/2022 Left ?03/05/2022  ?Hip flexion    ?Hip extension    ?Hip abduction    ?Hip adduction    ?Hip internal rotation    ?Hip external rotation    ?Knee flexion 135 135  ?Knee extension 0 0  ?Ankle dorsiflexion    ?Ankle plantarflexion    ?Ankle inversion    ?Ankle eversion    ? (Blank rows = not tested) ? ?LE MMT: ? ?MMT Right ?03/05/2022 Left ?03/05/2022  ?Hip flexion 4+ 5  ?Hip extension    ?Hip abduction 5 5  ?Hip adduction 5 5  ?Hip internal rotation    ?Hip external rotation    ?Knee flexion 5 5  ?Knee extension 5*painful 5  ?Ankle dorsiflexion 5 5  ?Ankle plantarflexion 5 5  ?Ankle inversion    ?Ankle eversion     ? (Blank rows = not tested) ? ?LOWER EXTREMITY SPECIAL TESTS:  ?  Negative valgus/varus stress tests, neg for meniscus involvement, neg apprehension tests ? ?FUNCTIONAL TESTS:  ?5 times sit to stand: 16 seconds, pain on 1st rep. ? ?GAIT: ?Distance walked: 50 ?Assistive device utilized: None ?Level of assistance: Complete Independence ?Comments: no deviation or device, normal gait speed.  ? ? ? ?TODAY'S TREATMENT: ?  Self Care: education on findings, plan of care, interventions including dry needling, review and progression of current HEP - added quad set to SLR, prone knee bend with strap, demo's standing knee bend for quad stretching.   ? ? ?PATIENT EDUCATION:  ?Education details: see treatment ?Person educated: Patient ?Education method: Explanation, Demonstration, Verbal cues, and Handouts ?Education comprehension: verbalized understanding and returned demonstration ? ? ?HOME EXERCISE PROGRAM: ?Access Code: [email or web address] ?URL: [email or web address] ?Date: 03/05/2022 ?Prepared by: Glenetta Hew ? ?Exercises ?- Prone Quadriceps Stretch with Strap  - 1 x daily - 7 x weekly - 3 reps - 30 sec hold ?- Quadriceps Stretch with Chair  - 1 x daily - 7 x weekly - 3 reps - 30 sec  hold ? ?ASSESSMENT: ? ?CLINICAL IMPRESSION: ?Patient is a 61 y.o. female who was seen today for physical therapy evaluation and treatment for R knee pain.  She demonstrates R quadriceps tightness, tenderness over LCL and pes anserine, but otherwise no significant findings.  She did have pain with resisted R knee extension.  She would benefit from skilled physical therapy to improve R quadriceps strength and extensibility to return to exercise without fear or pain.  ? ? ?OBJECTIVE IMPAIRMENTS decreased activity tolerance, decreased endurance, difficulty walking, decreased strength, increased fascial restrictions, increased muscle spasms, impaired flexibility, and pain.  ? ?ACTIVITY LIMITATIONS community activity and exercise .   ? ?PERSONAL FACTORS 1 comorbidity: AVN R 3rd MTT  are also affecting patient's functional outcome.  ? ? ?REHAB POTENTIAL: Excellent ? ?CLINICAL DECISION MAKING: Stable/uncomplicated ? ?EVALUATION COMPLEXITY: Low ? ? ?GOALS: ?Goals reviewed with patient? No ? ?SHORT TERM GOALS: Target date: 03/19/2022 ? ?Pt will be independent in initial HEP ?Baseline: ?Goal status: INITIAL ? ? ? ?LONG TERM GOALS: Target date: 04/16/2022 ? ?Ind with progressed HEP to improve outcomes.  ?Baseline: has initial HEP ?Goal status: INITIAL ? ?2.  Improve knee FOTO to 76% ?Baseline: 62% ?Goal status: INITIAL ? ?3.  Ambulate for 1 mile without increased R knee pain ?Baseline: stopped walking due to pain.  ?Goal status: INITIAL ? ?4.  Improve functional strength by completing 5x STS in < 14 seconds ?Baseline: 16 seconds with pain first rep  ?Goal status: INITIAL ? ?5.  Demonstrate  5/5 RLE strength without pain ?Baseline: 4+/5 R hip flexion, 5/5 R quad but increased pain ?Goal status: INITIAL ? ? ?PLAN: ?PT FREQUENCY: 1x/week ? ?PT DURATION: 6 weeks ? ?PLANNED INTERVENTIONS: Therapeutic exercises, Therapeutic activity, Neuromuscular re-education, Balance training, Gait training, Patient/Family education, Joint mobilization, and Stair training ? ?PLAN FOR NEXT SESSION: review and progress exercises for knee strengthening, DN R quadriceps.  ? ? ?Rennie Natter, PT, DPT  ?03/05/2022, 11:22 AM ? ?

## 2022-03-06 NOTE — Addendum Note (Signed)
Addended by: Rennie Natter on: 03/06/2022 09:51 AM ? ? Modules accepted: Orders ? ?

## 2022-03-14 ENCOUNTER — Ambulatory Visit: Payer: 59 | Admitting: Physical Therapy

## 2022-03-14 ENCOUNTER — Encounter: Payer: Self-pay | Admitting: Physical Therapy

## 2022-03-14 DIAGNOSIS — R262 Difficulty in walking, not elsewhere classified: Secondary | ICD-10-CM

## 2022-03-14 DIAGNOSIS — M25661 Stiffness of right knee, not elsewhere classified: Secondary | ICD-10-CM | POA: Diagnosis not present

## 2022-03-14 DIAGNOSIS — R252 Cramp and spasm: Secondary | ICD-10-CM | POA: Diagnosis not present

## 2022-03-14 DIAGNOSIS — M25561 Pain in right knee: Secondary | ICD-10-CM

## 2022-03-14 DIAGNOSIS — M222X1 Patellofemoral disorders, right knee: Secondary | ICD-10-CM | POA: Diagnosis not present

## 2022-03-14 NOTE — Therapy (Signed)
?OUTPATIENT PHYSICAL THERAPY LOWER EXTREMITY EVALUATION ? ? ?Patient Name: LAJUNE PERINE ?MRN: [id number] ?DOB:05-Mar-1961, 61 y.o., female ?Today's Date: 03/14/2022 ? ? PT End of Session - 03/14/22 1533   ? ? Visit Number 2   ? Number of Visits 7   ? Date for PT Re-Evaluation 04/16/22   ? Authorization Type [facility] VL 25   ? Progress Note Due on Visit 7   ? PT Start Time 1533   ? PT Stop Time 1620   ? PT Time Calculation (min) 47 min   ? Activity Tolerance Patient tolerated treatment well   ? Behavior During Therapy [facility] for tasks assessed/performed   ? ?  ?  ? ?  ? ? ?Past Medical History:  ?Diagnosis Date  ? Acne   ? Arrhythmia   ? Bursitis, subacromial   ? Left  ? Endometriosis   ? GERD (gastroesophageal reflux disease)   ? Retinal tear   ? Sinusitis   ? ?Past Surgical History:  ?Procedure Laterality Date  ? ABDOMINAL HYSTERECTOMY    ? ABDOMINAL SURGERY    ? csection  ? DILATION AND CURETTAGE OF UTERUS    ? SHOULDER ARTHROSCOPY WITH SUBACROMIAL DECOMPRESSION Left 10/31/2017  ? Procedure: SHOULDER ARTHROSCOPY WITH BURSECTOMY AND SUBACROMIAL DECOMPRESSION;  Surgeon: Tania Ade, MD;  Location: [place];  Service: Orthopedics;  Laterality: Left;  Left shoulder arthroscopy bursectomy and subacromial decompression  ? TONSILLECTOMY    ? ?Patient Active Problem List  ? Diagnosis Date Noted  ? Patellofemoral pain syndrome of right knee 01/17/2022  ? ? ?PCP: Jefm Petty, MD ? ?REFERRING PROVIDER: Rosemarie Ax MD ? ?REFERRING DIAG: M22.2X1 (ICD-10-CM) - Patellofemoral pain syndrome of right knee  ? ?THERAPY DIAG:  ?Acute pain of right knee ? ?Stiffness of right knee, not elsewhere classified ? ?Cramp and spasm ? ?Difficulty in walking, not elsewhere classified ? ?ONSET DATE: 12/29/2021 ? ?SUBJECTIVE:  ? ?SUBJECTIVE STATEMENT: ?Pt. Reports she worked the last couple of nights, knee has been popping a lot. Walks about 12,000 steps a shift.  No real pain this week, just the popping and swelling.  ? ?PERTINENT  HISTORY: ?Scoliosis,  L SAD 2019, abdominal surgery; Frieburgs disease R foot (AVN of metatarsal), OA R knee ? ?PAIN:  ?Are you having pain?  No   ? ?PRECAUTIONS: None ? ? ?PATIENT GOALS Strengthen knee without damaging it be able to exercise.  ? ? ?OBJECTIVE:  ? ?DIAGNOSTIC FINDINGS: From MD note: Independent review of the right knee x-ray from 3/10 shows mild degenerative changes in the medial compartment. ? ?PATIENT SURVEYS:  ?FOTO 62% knee.  Predicted outcome 76% ? ?MUSCLE LENGTH: ?Hamstrings: Right 140 deg; Left 140 deg ?Quadriceps: Right 60 deg; Left 65 deg ? ?POSTURE:  ?WNL ? ?PALPATION: ?Tenderness R LCL and pes anserine ? ?LE ROM: ? ?Active ROM Right ?03/05/2022 Left ?03/05/2022  ?Knee flexion 135 135  ?Knee extension 0 0  ? (Blank rows = not tested) ? ?LE MMT: ? ?MMT Right ?03/05/2022 Left ?03/05/2022  ?Hip flexion 4+ 5  ?Hip extension    ?Hip abduction 5 5  ?Hip adduction 5 5  ?Hip internal rotation    ?Hip external rotation    ?Knee flexion 5 5  ?Knee extension 5*painful 5  ?Ankle dorsiflexion 5 5  ?Ankle plantarflexion 5 5  ? (Blank rows = not tested) ? ?LOWER EXTREMITY SPECIAL TESTS:  ?  Negative valgus/varus stress tests, neg for meniscus involvement, neg apprehension tests ? ?FUNCTIONAL TESTS:  ?5 times  sit to stand: 16 seconds, pain on 1st rep. ? ?GAIT: ?Distance walked: 50 ?Assistive device utilized: None ?Level of assistance: Complete Independence ?Comments: no deviation or device, normal gait speed.  ? ? ? ?TODAY'S TREATMENT: ?03/14/2022 Therapeutic Exercise: to improve strength and mobility.  Verbal and tactile cues throughout for technique. ?Bike x 6 min warm up ?Step ups (1 riser) 2 x 10 bil  ?Step down (no riser) 2 x 10 bil  ?Start excursion pattern - taps 180 deg forward to back x 3 bil - noted weakness in R SLS ?Supine: bridges x 10, bridges with RTB and hip abduction x 10 ?Sidelying: clamshell RTB 2 x 10 bil  ?Manual therapy: IASTM with stick to quads - with return demo from patient, STM to  quads, skilled palpation and monitoring with dry needling.  ? ?Trigger Point Dry-Needling  ?Treatment instructions: Expect mild to moderate muscle soreness. S/S of pneumothorax if dry needled over a lung field, and to seek immediate medical attention should they occur. Patient verbalized understanding of these instructions and education. ? ?Patient Consent Given: Yes ?Education handout provided: Previously provided ?Muscles treated: vastus lateralis, rectus femoris, gracilis ?Electrical stimulation performed: No ?Parameters: N/A ?Treatment response/outcome: Twitch Response Elicited and Palpable Increase in Muscle Length ?  ? ?03/05/2022 Self Care: education on findings, plan of care, interventions including dry needling, review and progression of current HEP - added quad set to SLR, prone knee bend with strap, demo's standing knee bend for quad stretching.   ? ? ?PATIENT EDUCATION:  ?Education details: HEP update ?Person educated: Patient ?Education method: Explanation, Demonstration, Verbal cues, and Handouts ?Education comprehension: verbalized understanding and returned demonstration ? ? ?HOME EXERCISE PROGRAM: ?Access Code: [email or web address] ? ?ASSESSMENT: ? ?CLINICAL IMPRESSION: ?AIDAH FORQUER reports no pain this week but continued popping.  Progressed exercises today for hip and knee strengthening, noted weakness in R hip with single leg stance activities.  No pain with exercises, updated HEP for strengthening exercises and issued RTB, followed by manual therapy to decrease tightness in R quads to see if decreased tightness would improve patellar tracking and decrease "popping"  with activity.    ? ? ?OBJECTIVE IMPAIRMENTS decreased activity tolerance, decreased endurance, difficulty walking, decreased strength, increased fascial restrictions, increased muscle spasms, impaired flexibility, and pain.  ? ?ACTIVITY LIMITATIONS community activity and exercise .  ? ?PERSONAL FACTORS 1 comorbidity: AVN R 3rd MTT  are also  affecting patient's functional outcome.  ? ? ?REHAB POTENTIAL: Excellent ? ?CLINICAL DECISION MAKING: Stable/uncomplicated ? ?EVALUATION COMPLEXITY: Low ? ? ?GOALS: ?Goals reviewed with patient? No ? ?SHORT TERM GOALS: Target date: 03/19/2022 ? ?Pt will be independent in initial HEP ?Baseline: ?Goal status: MET ? ? ? ?LONG TERM GOALS: Target date: 04/16/2022 ? ?Ind with progressed HEP to improve outcomes.  ?Baseline: has initial HEP ?Goal status: IN PROGRESS ? ?2.  Improve knee FOTO to 76% ?Baseline: 62% ?Goal status: IN PROGRESS ? ?3.  Ambulate for 1 mile without increased R knee pain ?Baseline: stopped walking due to pain.  ?Goal status: IN PROGRESS ? ?4.  Improve functional strength by completing 5x STS in < 14 seconds ?Baseline: 16 seconds with pain first rep  ?Goal status: IN PROGRESS ? ?5.  Demonstrate 5/5 RLE strength without pain ?Baseline: 4+/5 R hip flexion, 5/5 R quad but increased pain ?Goal status: IN PROGRESS ? ? ?PLAN: ?PT FREQUENCY: 1x/week ? ?PT DURATION: 6 weeks ? ?PLANNED INTERVENTIONS: Therapeutic exercises, Therapeutic activity, Neuromuscular re-education, Balance training, Gait training, Patient/Family education, Joint  mobilization, and Stair training ? ?PLAN FOR NEXT SESSION:  progress exercises for knee/hip strengthening, DN R quadriceps.  ? ? ?Rennie Natter, PT, DPT  ?03/14/2022, 6:05 PM ? ?

## 2022-03-19 NOTE — Therapy (Signed)
OUTPATIENT PHYSICAL THERAPY TREATMENT   Patient Name: Danielle Braun MRN: [id number] DOB:April 21, 1961, 61 y.o., female Today's Date: 03/19/2022     Past Medical History:  Diagnosis Date   Acne    Arrhythmia    Bursitis, subacromial    Left   Endometriosis    GERD (gastroesophageal reflux disease)    Retinal tear    Sinusitis    Past Surgical History:  Procedure Laterality Date   ABDOMINAL HYSTERECTOMY     ABDOMINAL SURGERY     csection   DILATION AND CURETTAGE OF UTERUS     SHOULDER ARTHROSCOPY WITH SUBACROMIAL DECOMPRESSION Left 10/31/2017   Procedure: SHOULDER ARTHROSCOPY WITH BURSECTOMY AND SUBACROMIAL DECOMPRESSION;  Surgeon: Tania Ade, MD;  Location: [place];  Service: Orthopedics;  Laterality: Left;  Left shoulder arthroscopy bursectomy and subacromial decompression   TONSILLECTOMY     Patient Active Problem List   Diagnosis Date Noted   Patellofemoral pain syndrome of right knee 01/17/2022    PCP: Jefm Petty, MD  REFERRING PROVIDER: Rosemarie Ax MD  REFERRING DIAG: [phone] (ICD-10-CM) - Patellofemoral pain syndrome of right knee   THERAPY DIAG:  No diagnosis found.  ONSET DATE: 12/29/2021  SUBJECTIVE:   SUBJECTIVE STATEMENT: ***   PERTINENT HISTORY: Scoliosis,  L SAD 2019, abdominal surgery; Frieburgs disease R foot (AVN of metatarsal), OA R knee  PAIN:  *** Are you having pain?  No    PRECAUTIONS: None   PATIENT GOALS Strengthen knee without damaging it be able to exercise.    OBJECTIVE:   DIAGNOSTIC FINDINGS: From MD note: Independent review of the right knee x-ray from 3/10 shows mild degenerative changes in the medial compartment.  PATIENT SURVEYS:  FOTO 62% knee.  Predicted outcome 76%  MUSCLE LENGTH: Hamstrings: Right 140 deg; Left 140 deg Quadriceps: Right 60 deg; Left 65 deg  POSTURE:  WNL  PALPATION: Tenderness R LCL and pes anserine  LE ROM:  Active ROM Right 03/05/2022 Left 03/05/2022  Knee flexion 135  135  Knee extension 0 0   (Blank rows = not tested)  LE MMT:  MMT Right 03/05/2022 Left 03/05/2022  Hip flexion 4+ 5  Hip extension    Hip abduction 5 5  Hip adduction 5 5  Hip internal rotation    Hip external rotation    Knee flexion 5 5  Knee extension 5*painful 5  Ankle dorsiflexion 5 5  Ankle plantarflexion 5 5   (Blank rows = not tested)  LOWER EXTREMITY SPECIAL TESTS:    Negative valgus/varus stress tests, neg for meniscus involvement, neg apprehension tests  FUNCTIONAL TESTS:  5 times sit to stand: 16 seconds, pain on 1st rep.  GAIT: Distance walked: 50 Assistive device utilized: None Level of assistance: Complete Independence Comments: no deviation or device, normal gait speed.     TODAY'S TREATMENT:   03/20/22: Therapeutic Exercise: to improve strength and mobility.  Verbal and tactile cues throughout for technique.  ***   03/14/2022 Therapeutic Exercise: to improve strength and mobility.  Verbal and tactile cues throughout for technique. Bike x 6 min warm up Step ups (1 riser) 2 x 10 bil  Step down (no riser) 2 x 10 bil  Start excursion pattern - taps 180 deg forward to back x 3 bil - noted weakness in R SLS Supine: bridges x 10, bridges with RTB and hip abduction x 10 Sidelying: clamshell RTB 2 x 10 bil  Manual therapy: IASTM with stick to quads - with return demo from patient,  STM to quads, skilled palpation and monitoring with dry needling.   Trigger Point Dry-Needling  Treatment instructions: Expect mild to moderate muscle soreness. S/S of pneumothorax if dry needled over a lung field, and to seek immediate medical attention should they occur. Patient verbalized understanding of these instructions and education.  Patient Consent Given: Yes Education handout provided: Previously provided Muscles treated: vastus lateralis, rectus femoris, gracilis Electrical stimulation performed: No Parameters: N/A Treatment response/outcome: Twitch Response  Elicited and Palpable Increase in Muscle Length    03/05/2022 Self Care: education on findings, plan of care, interventions including dry needling, review and progression of current HEP - added quad set to SLR, prone knee bend with strap, demo's standing knee bend for quad stretching.     PATIENT EDUCATION:  Education details: *** HEP update Person educated: Patient Education method: Explanation, Demonstration, Verbal cues, and Handouts Education comprehension: verbalized understanding and returned demonstration   HOME EXERCISE PROGRAM: Access Code: [email or web address]  ASSESSMENT:  CLINICAL IMPRESSION: **8.      OBJECTIVE IMPAIRMENTS decreased activity tolerance, decreased endurance, difficulty walking, decreased strength, increased fascial restrictions, increased muscle spasms, impaired flexibility, and pain.   ACTIVITY LIMITATIONS community activity and exercise .   PERSONAL FACTORS 1 comorbidity: AVN R 3rd MTT  are also affecting patient's functional outcome.    REHAB POTENTIAL: Excellent  CLINICAL DECISION MAKING: Stable/uncomplicated  EVALUATION COMPLEXITY: Low   GOALS: Goals reviewed with patient? No  SHORT TERM GOALS: Target date: 03/19/2022  Pt will be independent in initial HEP Baseline: Goal status: MET    LONG TERM GOALS: Target date: 04/16/2022  Ind with progressed HEP to improve outcomes.  Baseline: has initial HEP Goal status: IN PROGRESS  2.  Improve knee FOTO to 76% Baseline: 62% Goal status: IN PROGRESS  3.  Ambulate for 1 mile without increased R knee pain Baseline: stopped walking due to pain.  Goal status: IN PROGRESS  4.  Improve functional strength by completing 5x STS in < 14 seconds Baseline: 16 seconds with pain first rep  Goal status: IN PROGRESS  5.  Demonstrate 5/5 RLE strength without pain Baseline: 4+/5 R hip flexion, 5/5 R quad but increased pain Goal status: IN PROGRESS   PLAN: PT FREQUENCY: 1x/week  PT DURATION: 6  weeks  PLANNED INTERVENTIONS: Therapeutic exercises, Therapeutic activity, Neuromuscular re-education, Balance training, Gait training, Patient/Family education, Joint mobilization, and Stair training  PLAN FOR NEXT SESSION:  *** progress exercises for knee/hip strengthening, DN R quadriceps.    Mohammad Granade, PT, DPT  03/19/2022, 5:35 PM

## 2022-03-20 ENCOUNTER — Encounter: Payer: Self-pay | Admitting: Physical Therapy

## 2022-03-20 ENCOUNTER — Other Ambulatory Visit: Payer: Self-pay

## 2022-03-20 ENCOUNTER — Ambulatory Visit: Payer: 59 | Admitting: Physical Therapy

## 2022-03-20 DIAGNOSIS — R252 Cramp and spasm: Secondary | ICD-10-CM | POA: Diagnosis not present

## 2022-03-20 DIAGNOSIS — M25661 Stiffness of right knee, not elsewhere classified: Secondary | ICD-10-CM

## 2022-03-20 DIAGNOSIS — R262 Difficulty in walking, not elsewhere classified: Secondary | ICD-10-CM

## 2022-03-20 DIAGNOSIS — M25561 Pain in right knee: Secondary | ICD-10-CM

## 2022-03-20 DIAGNOSIS — M222X1 Patellofemoral disorders, right knee: Secondary | ICD-10-CM | POA: Diagnosis not present

## 2022-03-21 DIAGNOSIS — J342 Deviated nasal septum: Secondary | ICD-10-CM | POA: Diagnosis not present

## 2022-03-21 DIAGNOSIS — J343 Hypertrophy of nasal turbinates: Secondary | ICD-10-CM | POA: Diagnosis not present

## 2022-03-21 DIAGNOSIS — J310 Chronic rhinitis: Secondary | ICD-10-CM | POA: Diagnosis not present

## 2022-03-23 ENCOUNTER — Other Ambulatory Visit (HOSPITAL_COMMUNITY): Payer: Self-pay

## 2022-03-29 ENCOUNTER — Encounter: Payer: Self-pay | Admitting: Physical Therapy

## 2022-03-29 ENCOUNTER — Ambulatory Visit: Payer: 59 | Attending: Family Medicine | Admitting: Physical Therapy

## 2022-03-29 DIAGNOSIS — R252 Cramp and spasm: Secondary | ICD-10-CM | POA: Insufficient documentation

## 2022-03-29 DIAGNOSIS — M25661 Stiffness of right knee, not elsewhere classified: Secondary | ICD-10-CM | POA: Insufficient documentation

## 2022-03-29 DIAGNOSIS — R262 Difficulty in walking, not elsewhere classified: Secondary | ICD-10-CM | POA: Diagnosis not present

## 2022-03-29 DIAGNOSIS — M25561 Pain in right knee: Secondary | ICD-10-CM | POA: Insufficient documentation

## 2022-03-29 NOTE — Therapy (Unsigned)
OUTPATIENT PHYSICAL THERAPY TREATMENT NOTE   Patient Name: Danielle Braun MRN: [id number] DOB:1961/07/14, 61 y.o., female Today's Date: 03/30/2022   PT End of Session - 03/29/22 1403     Visit Number 4    Number of Visits 7    Date for PT Re-Evaluation 04/16/22    Authorization Type [facility] VL 25    Progress Note Due on Visit 7    PT Start Time 1194    PT Stop Time 1445    PT Time Calculation (min) 43 min    Activity Tolerance Patient tolerated treatment well    Behavior During Therapy [facility] for tasks assessed/performed           Rationale for Evaluation and Treatment Rehabilitation    Past Medical History:  Diagnosis Date   Acne    Arrhythmia    Bursitis, subacromial    Left   Endometriosis    GERD (gastroesophageal reflux disease)    Retinal tear    Sinusitis    Past Surgical History:  Procedure Laterality Date   ABDOMINAL HYSTERECTOMY     ABDOMINAL SURGERY     csection   DILATION AND CURETTAGE OF UTERUS     SHOULDER ARTHROSCOPY WITH SUBACROMIAL DECOMPRESSION Left 10/31/2017   Procedure: SHOULDER ARTHROSCOPY WITH BURSECTOMY AND SUBACROMIAL DECOMPRESSION;  Surgeon: Tania Ade, MD;  Location: [place];  Service: Orthopedics;  Laterality: Left;  Left shoulder arthroscopy bursectomy and subacromial decompression   TONSILLECTOMY     Patient Active Problem List   Diagnosis Date Noted   Patellofemoral pain syndrome of right knee 01/17/2022    PCP: Jefm Petty, MD  REFERRING PROVIDER: Rosemarie Ax MD  REFERRING DIAG: [phone] (ICD-10-CM) - Patellofemoral pain syndrome of right knee   THERAPY DIAG:  Acute pain of right knee  Stiffness of right knee, not elsewhere classified  Cramp and spasm  Difficulty in walking, not elsewhere classified  ONSET DATE: 12/29/2021  SUBJECTIVE:   SUBJECTIVE STATEMENT: Patient states her knee is improving.  Some pain today but did work last night.  Not swelling as much.   PERTINENT HISTORY: Scoliosis,  L SAD 2019,  abdominal surgery; Frieburgs disease R foot (AVN of metatarsal), OA R knee  PAIN:   Are you having pain? Yes: NPRS scale: 2/10 Pain location: R knee  PRECAUTIONS: None   PATIENT GOALS Strengthen knee without damaging it be able to exercise.    OBJECTIVE:   DIAGNOSTIC FINDINGS: From MD note: Independent review of the right knee x-ray from 3/10 shows mild degenerative changes in the medial compartment.  PATIENT SURVEYS:  FOTO 62% knee.  Predicted outcome 76%  MUSCLE LENGTH: Hamstrings: Right 140 deg; Left 140 deg Quadriceps: Right 60 deg; Left 65 deg  POSTURE:  WNL  PALPATION: Tenderness R LCL and pes anserine  LE ROM:  Active ROM Right 03/05/2022 Left 03/05/2022  Knee flexion 135 135  Knee extension 0 0   (Blank rows = not tested)  LE MMT:  MMT Right 03/05/2022 Left 03/05/2022  Hip flexion 4+ 5  Hip extension    Hip abduction 5 5  Hip adduction 5 5  Hip internal rotation    Hip external rotation    Knee flexion 5 5  Knee extension 5*painful 5  Ankle dorsiflexion 5 5  Ankle plantarflexion 5 5   (Blank rows = not tested)  LOWER EXTREMITY SPECIAL TESTS:    Negative valgus/varus stress tests, neg for meniscus involvement, neg apprehension tests  FUNCTIONAL TESTS:  5 times sit  to stand: 16 seconds, pain on 1st rep.  GAIT: Distance walked: 50 Assistive device utilized: None Level of assistance: Complete Independence Comments: no deviation or device, normal gait speed.     TODAY'S TREATMENT:  03/29/22: Therapeutic Exercise: to improve strength and mobility.  Verbal and tactile cues throughout for technique.  Bike L4 x 6 min warm up Step ups (2 riser) 2 x 10 bil  Side Step down (2 riser) 2 x 10 bil  Functional squat with heels elevated 2 x 10 Star excursion pattern  with carpet slides 180 deg forward to back x 5 bil  Manual Therapy: to decrease muscle spasm and pain and improve mobility IASTM to R quads and ITBand   03/20/22: Therapeutic Exercise: to  improve strength and mobility.  Verbal and tactile cues throughout for technique.  Bike L4 x 6 min warm up Step ups (1 riser) 2 x 10 bil  Step down (1 riser) 2 x 10 bil  Heel tap no riser 2 x 10 bil Sit to stand x5 Functional squat x 10 Star excursion pattern - taps 180 deg forward to back x 6 R; x 2 L side; Supine: bridges with GTB x 10 sustained bridge with hip abduction x 10 Sidelying: clamshell GTB 2 x 10 bil   03/14/2022 Therapeutic Exercise: to improve strength and mobility.  Verbal and tactile cues throughout for technique. Bike x 6 min warm up Step ups (1 riser) 2 x 10 bil  Step down (no riser) 2 x 10 bil  Start excursion pattern - taps 180 deg forward to back x 3 bil - noted weakness in R SLS Supine: bridges x 10, bridges with RTB and hip abduction x 10 Sidelying: clamshell RTB 2 x 10 bil  Manual therapy: IASTM with stick to quads - with return demo from patient, STM to quads, skilled palpation and monitoring with dry needling.   Trigger Point Dry-Needling  Treatment instructions: Expect mild to moderate muscle soreness. S/S of pneumothorax if dry needled over a lung field, and to seek immediate medical attention should they occur. Patient verbalized understanding of these instructions and education.  Patient Consent Given: Yes Education handout provided: Previously provided Muscles treated: vastus lateralis, rectus femoris, gracilis Electrical stimulation performed: No Parameters: N/A Treatment response/outcome: Twitch Response Elicited and Palpable Increase in Muscle Length    03/05/2022 Self Care: education on findings, plan of care, interventions including dry needling, review and progression of current HEP - added quad set to SLR, prone knee bend with strap, demo's standing knee bend for quad stretching.     PATIENT EDUCATION:  Education details:  HEP progression Person educated: Patient Education method: Customer service manager Education comprehension:  verbalized understanding and returned demonstration   HOME EXERCISE PROGRAM: Access Code: [email or web address]  ASSESSMENT:  CLINICAL IMPRESSION: Danielle Braun is making good progress towards goals, reporting less pain and popping in R knee, tolerated progression of exercises without increased pain.  Reported decreased tightness following manual therapy.  She would benefit from continued skilled therapy.   OBJECTIVE IMPAIRMENTS decreased activity tolerance, decreased endurance, difficulty walking, decreased strength, increased fascial restrictions, increased muscle spasms, impaired flexibility, and pain.   ACTIVITY LIMITATIONS community activity and exercise .   PERSONAL FACTORS 1 comorbidity: AVN R 3rd MTT  are also affecting patient's functional outcome.    REHAB POTENTIAL: Excellent  CLINICAL DECISION MAKING: Stable/uncomplicated  EVALUATION COMPLEXITY: Low   GOALS: Goals reviewed with patient? No  SHORT TERM GOALS: Target date: 03/19/2022  Pt will  be independent in initial HEP Baseline: Goal status: MET    LONG TERM GOALS: Target date: 04/16/2022  Ind with progressed HEP to improve outcomes.  Baseline: has initial HEP Goal status: IN PROGRESS  2.  Improve knee FOTO to 76% Baseline: 62% Goal status: IN PROGRESS  3.  Ambulate for 1 mile without increased R knee pain Baseline: stopped walking due to pain.  Goal status: IN PROGRESS  4.  Improve functional strength by completing 5x STS in < 14 seconds Baseline: 13.25 seconds   Goal status: MET  5.  Demonstrate 5/5 RLE strength without pain Baseline: 4+/5 R hip flexion, 5/5 R quad but increased pain Goal status: IN PROGRESS   PLAN: PT FREQUENCY: 1x/week  PT DURATION: 6 weeks  PLANNED INTERVENTIONS: Therapeutic exercises, Therapeutic activity, Neuromuscular re-education, Balance training, Gait training, Patient/Family education, Joint mobilization, and Stair training  PLAN FOR NEXT SESSION:   progress exercises for  knee/hip strengthening, DN R quadriceps.    Rennie Natter, PT, DPT  03/30/2022, 8:21 AM

## 2022-04-02 ENCOUNTER — Encounter: Payer: Self-pay | Admitting: Physical Therapy

## 2022-04-02 ENCOUNTER — Ambulatory Visit: Payer: 59 | Admitting: Physical Therapy

## 2022-04-02 DIAGNOSIS — R262 Difficulty in walking, not elsewhere classified: Secondary | ICD-10-CM | POA: Diagnosis not present

## 2022-04-02 DIAGNOSIS — R252 Cramp and spasm: Secondary | ICD-10-CM | POA: Diagnosis not present

## 2022-04-02 DIAGNOSIS — M25661 Stiffness of right knee, not elsewhere classified: Secondary | ICD-10-CM | POA: Diagnosis not present

## 2022-04-02 DIAGNOSIS — M25561 Pain in right knee: Secondary | ICD-10-CM | POA: Diagnosis not present

## 2022-04-02 NOTE — Therapy (Signed)
OUTPATIENT PHYSICAL THERAPY TREATMENT NOTE   Patient Name: Danielle Braun MRN: [id number] DOB:1961/10/19, 61 y.o., female Today's Date: 04/02/2022   PT End of Session - 04/02/22 1105     Visit Number 5    Number of Visits 7    Date for PT Re-Evaluation 04/16/22    Authorization Type [facility] VL 25    Progress Note Due on Visit 7    PT Start Time 1104    PT Stop Time 1150    PT Time Calculation (min) 46 min    Activity Tolerance Patient tolerated treatment well    Behavior During Therapy [facility] for tasks assessed/performed           Rationale for Evaluation and Treatment Rehabilitation    Past Medical History:  Diagnosis Date   Acne    Arrhythmia    Bursitis, subacromial    Left   Endometriosis    GERD (gastroesophageal reflux disease)    Retinal tear    Sinusitis    Past Surgical History:  Procedure Laterality Date   ABDOMINAL HYSTERECTOMY     ABDOMINAL SURGERY     csection   DILATION AND CURETTAGE OF UTERUS     SHOULDER ARTHROSCOPY WITH SUBACROMIAL DECOMPRESSION Left 10/31/2017   Procedure: SHOULDER ARTHROSCOPY WITH BURSECTOMY AND SUBACROMIAL DECOMPRESSION;  Surgeon: Tania Ade, MD;  Location: [place];  Service: Orthopedics;  Laterality: Left;  Left shoulder arthroscopy bursectomy and subacromial decompression   TONSILLECTOMY     Patient Active Problem List   Diagnosis Date Noted   Patellofemoral pain syndrome of right knee 01/17/2022    PCP: Jefm Petty, MD  REFERRING PROVIDER: Rosemarie Ax MD  REFERRING DIAG: [phone] (ICD-10-CM) - Patellofemoral pain syndrome of right knee   THERAPY DIAG:  Acute pain of right knee  Stiffness of right knee, not elsewhere classified  Cramp and spasm  Difficulty in walking, not elsewhere classified  ONSET DATE: 12/29/2021  SUBJECTIVE:   SUBJECTIVE STATEMENT: Patient states her knee still pops and swells, but not as bad as in the beginning.  Not sure what is continuing to aggravate it.    PERTINENT  HISTORY: Scoliosis,  L SAD 2019, abdominal surgery; Frieburgs disease R foot (AVN of metatarsal), OA R knee  PAIN:   Are you having pain? Yes: NPRS scale: 3/10 Pain location: R knee  PRECAUTIONS: None   PATIENT GOALS Strengthen knee without damaging it be able to exercise.    OBJECTIVE:   DIAGNOSTIC FINDINGS: From MD note: Independent review of the right knee x-ray from 3/10 shows mild degenerative changes in the medial compartment.  PATIENT SURVEYS:  FOTO 62% knee.  Predicted outcome 76%  MUSCLE LENGTH: Hamstrings: Right 140 deg; Left 140 deg Quadriceps: Right 60 deg; Left 65 deg  POSTURE:  WNL  PALPATION: Tenderness R LCL and pes anserine  LE ROM:  Active ROM Right 03/05/2022 Left 03/05/2022  Knee flexion 135 135  Knee extension 0 0   (Blank rows = not tested)  LE MMT:  MMT Right 03/05/2022 Left 03/05/2022  Hip flexion 4+ 5  Hip extension    Hip abduction 5 5  Hip adduction 5 5  Hip internal rotation    Hip external rotation    Knee flexion 5 5  Knee extension 5*painful 5  Ankle dorsiflexion 5 5  Ankle plantarflexion 5 5   (Blank rows = not tested)  LOWER EXTREMITY SPECIAL TESTS:    Negative valgus/varus stress tests, neg for meniscus involvement, neg apprehension tests  FUNCTIONAL TESTS:  5 times sit to stand: 16 seconds, pain on 1st rep.  GAIT: Distance walked: 50 Assistive device utilized: None Level of assistance: Complete Independence Comments: no deviation or device, normal gait speed.   TODAY'S TREATMENT:  04/02/2022  Therapeutic Exercise: to improve strength and mobility.  Verbal and tactile cues throughout for technique. Bike L3 x 6 min warm up Calf stretch on balance board x 30 sec bil Balance board - frontal and sagittal x 20 followed by 1 min hold Cybex leg extensions #25 3 x 10 Cybex leg curls 25# 3 x 10   Single leg RDLS 3 x 10 bil - table in front for balance  Manual Therapy: to decrease muscle spasm and pain and improve  mobility STM to R distal ITBand/lateral hamstrings  03/29/22: Therapeutic Exercise: to improve strength and mobility.  Verbal and tactile cues throughout for technique.  Bike L4 x 6 min warm up Step ups (2 riser) 2 x 10 bil  Side Step down (2 riser) 2 x 10 bil  Functional squat with heels elevated 2 x 10 Star excursion pattern  with carpet slides 180 deg forward to back x 5 bil  Manual Therapy: to decrease muscle spasm and pain and improve mobility IASTM to R quads and ITBand   03/20/22: Therapeutic Exercise: to improve strength and mobility.  Verbal and tactile cues throughout for technique.  Bike L4 x 6 min warm up Step ups (1 riser) 2 x 10 bil  Step down (1 riser) 2 x 10 bil  Heel tap no riser 2 x 10 bil Sit to stand x5 Functional squat x 10 Star excursion pattern - taps 180 deg forward to back x 6 R; x 2 L side; Supine: bridges with GTB x 10 sustained bridge with hip abduction x 10 Sidelying: clamshell GTB 2 x 10 bil  PATIENT EDUCATION:  Education details:  HEP progression Person educated: Patient Education method: Customer service manager Education comprehension: verbalized understanding and returned demonstration   HOME EXERCISE PROGRAM: Access Code: [email or web address]  ASSESSMENT:  CLINICAL IMPRESSION: Danielle Braun is making good progress, progressed exercises today for more hamstring strengthening.  Tolerated all exercises well without increased R knee pain, and reported decreased tenderness at IT band attachment.  She would benefit from continued skilled therapy.   OBJECTIVE IMPAIRMENTS decreased activity tolerance, decreased endurance, difficulty walking, decreased strength, increased fascial restrictions, increased muscle spasms, impaired flexibility, and pain.   ACTIVITY LIMITATIONS community activity and exercise .   PERSONAL FACTORS 1 comorbidity: AVN R 3rd MTT  are also affecting patient's functional outcome.    REHAB POTENTIAL: Excellent  CLINICAL  DECISION MAKING: Stable/uncomplicated  EVALUATION COMPLEXITY: Low   GOALS: Goals reviewed with patient? No  SHORT TERM GOALS: Target date: 03/19/2022  Pt will be independent in initial HEP Baseline: Goal status: MET    LONG TERM GOALS: Target date: 04/16/2022  Ind with progressed HEP to improve outcomes.  Baseline: has initial HEP Goal status: IN PROGRESS  2.  Improve knee FOTO to 76% Baseline: 62% Goal status: IN PROGRESS  3.  Ambulate for 1 mile without increased R knee pain Baseline: stopped walking due to pain.  Goal status: IN PROGRESS  4.  Improve functional strength by completing 5x STS in < 14 seconds Baseline: 13.25 seconds   Goal status: MET  5.  Demonstrate 5/5 RLE strength without pain Baseline: 4+/5 R hip flexion, 5/5 R quad but increased pain Goal status: IN PROGRESS   PLAN: PT FREQUENCY:  1x/week  PT DURATION: 6 weeks  PLANNED INTERVENTIONS: Therapeutic exercises, Therapeutic activity, Neuromuscular re-education, Balance training, Gait training, Patient/Family education, Joint mobilization, and Stair training  PLAN FOR NEXT SESSION:   progress exercises for knee/hip strengthening, DN R quadriceps.    Rennie Natter, PT, DPT  04/02/2022, 11:56 AM

## 2022-04-10 ENCOUNTER — Ambulatory Visit: Payer: 59 | Admitting: Physical Therapy

## 2022-04-10 ENCOUNTER — Encounter: Payer: Self-pay | Admitting: Physical Therapy

## 2022-04-10 DIAGNOSIS — R252 Cramp and spasm: Secondary | ICD-10-CM | POA: Diagnosis not present

## 2022-04-10 DIAGNOSIS — M25661 Stiffness of right knee, not elsewhere classified: Secondary | ICD-10-CM | POA: Diagnosis not present

## 2022-04-10 DIAGNOSIS — M25561 Pain in right knee: Secondary | ICD-10-CM | POA: Diagnosis not present

## 2022-04-10 DIAGNOSIS — R262 Difficulty in walking, not elsewhere classified: Secondary | ICD-10-CM

## 2022-04-10 NOTE — Therapy (Signed)
OUTPATIENT PHYSICAL THERAPY TREATMENT NOTE   Patient Name: Danielle Braun MRN: [id number] DOB:Nov 09, 1960, 61 y.o., female Today's Date: 04/10/2022   PT End of Session - 04/10/22 1315     Visit Number 6    Number of Visits 7    Date for PT Re-Evaluation 04/16/22    Authorization Type [facility] VL 25    Progress Note Due on Visit 7    PT Start Time 1315    PT Stop Time 4098    PT Time Calculation (min) 43 min    Activity Tolerance Patient tolerated treatment well    Behavior During Therapy [facility] for tasks assessed/performed           Rationale for Evaluation and Treatment Rehabilitation    Past Medical History:  Diagnosis Date   Acne    Arrhythmia    Bursitis, subacromial    Left   Endometriosis    GERD (gastroesophageal reflux disease)    Retinal tear    Sinusitis    Past Surgical History:  Procedure Laterality Date   ABDOMINAL HYSTERECTOMY     ABDOMINAL SURGERY     csection   DILATION AND CURETTAGE OF UTERUS     SHOULDER ARTHROSCOPY WITH SUBACROMIAL DECOMPRESSION Left 10/31/2017   Procedure: SHOULDER ARTHROSCOPY WITH BURSECTOMY AND SUBACROMIAL DECOMPRESSION;  Surgeon: Tania Ade, MD;  Location: [place];  Service: Orthopedics;  Laterality: Left;  Left shoulder arthroscopy bursectomy and subacromial decompression   TONSILLECTOMY     Patient Active Problem List   Diagnosis Date Noted   Patellofemoral pain syndrome of right knee 01/17/2022    PCP: Jefm Petty, MD  REFERRING PROVIDER: Rosemarie Ax MD  REFERRING DIAG: [phone] (ICD-10-CM) - Patellofemoral pain syndrome of right knee   THERAPY DIAG:  Acute pain of right knee  Stiffness of right knee, not elsewhere classified  Cramp and spasm  Difficulty in walking, not elsewhere classified  ONSET DATE: 12/29/2021  SUBJECTIVE:   SUBJECTIVE STATEMENT: Patient states her knee still pops but not painful.  Able to exercise more, not as much pain at end of shift.   PERTINENT HISTORY: Scoliosis,  L  SAD 2019, abdominal surgery; Frieburgs disease R foot (AVN of metatarsal), OA R knee  PAIN:   Are you having pain? Yes: NPRS scale: 2/10 Pain location: R knee  PRECAUTIONS: None   PATIENT GOALS Strengthen knee without damaging it be able to exercise.    OBJECTIVE:   DIAGNOSTIC FINDINGS: From MD note: Independent review of the right knee x-ray from 3/10 shows mild degenerative changes in the medial compartment.  PATIENT SURVEYS:  FOTO 62% knee.  Predicted outcome 76%  MUSCLE LENGTH: Hamstrings: Right 140 deg; Left 140 deg Quadriceps: Right 60 deg; Left 65 deg  POSTURE:  WNL  PALPATION: Tenderness R LCL and pes anserine  LE ROM:  Active ROM Right 03/05/2022 Left 03/05/2022  Knee flexion 135 135  Knee extension 0 0   (Blank rows = not tested)  LE MMT:  MMT Right 03/05/2022 Left 03/05/2022  Hip flexion 4+ 5  Hip extension    Hip abduction 5 5  Hip adduction 5 5  Hip internal rotation    Hip external rotation    Knee flexion 5 5  Knee extension 5*painful 5  Ankle dorsiflexion 5 5  Ankle plantarflexion 5 5   (Blank rows = not tested)  LOWER EXTREMITY SPECIAL TESTS:    Negative valgus/varus stress tests, neg for meniscus involvement, neg apprehension tests  FUNCTIONAL TESTS:  5  times sit to stand: 16 seconds, pain on 1st rep.  GAIT: Distance walked: 50 Assistive device utilized: None Level of assistance: Complete Independence Comments: no deviation or device, normal gait speed.   TODAY'S TREATMENT:  04/10/2022 Therapeutic Exercise: to improve strength and mobility.  Demo, verbal and tactile cues throughout for technique.  Bike L2 x 6 min  Stairs - 2 flights ascending and descending, reports feeling instability with R eccentric control, needs HR.   Step down 4" step with RTB pulling valgus to engage lateral glutes, 2 x 10 - no pain, less clicking  Squat with RTB to bias glutes 2 x 10  Monster walks RTB 4 x 20'  Side step with squat RTB 2 x 20' bil   Fire  hydrants 2 x 10 bil - no resistance  Star excursion pattern - taps full 180 deg, x 5 each side.   04/02/2022  Therapeutic Exercise: to improve strength and mobility.  Verbal and tactile cues throughout for technique. Bike L3 x 6 min warm up Calf stretch on balance board x 30 sec bil Balance board - frontal and sagittal x 20 followed by 1 min hold Cybex leg extensions #25 3 x 10 Cybex leg curls 25# 3 x 10   Single leg RDLS 3 x 10 bil - table in front for balance  Manual Therapy: to decrease muscle spasm and pain and improve mobility STM to R distal ITBand/lateral hamstrings  03/29/22: Therapeutic Exercise: to improve strength and mobility.  Verbal and tactile cues throughout for technique.  Bike L4 x 6 min warm up Step ups (2 riser) 2 x 10 bil  Side Step down (2 riser) 2 x 10 bil  Functional squat with heels elevated 2 x 10 Star excursion pattern  with carpet slides 180 deg forward to back x 5 bil  Manual Therapy: to decrease muscle spasm and pain and improve mobility IASTM to R quads and ITBand    PATIENT EDUCATION:  Education details:  HEP update Person educated: Patient Education method: Customer service manager Education comprehension: verbalized understanding and returned demonstration   HOME EXERCISE PROGRAM: Access Code: [email or web address]  ASSESSMENT:  CLINICAL IMPRESSION: CAGNEY STEENSON is making good progress but still reporting clicking in R knee and reports feeling insecure on stairs, reluctant to step down with reciprocal step unless has HR.  Today when added band pulling knee into valgus so she would resist and engage lateral glutes, she was able to step down off 4" step without any pain or clicking.  Progressed HEP focusing on lateral glutes to build stability and decrease R knee valgus.  She would benefit from continued skilled therapy.   OBJECTIVE IMPAIRMENTS decreased activity tolerance, decreased endurance, difficulty walking, decreased strength, increased fascial  restrictions, increased muscle spasms, impaired flexibility, and pain.   ACTIVITY LIMITATIONS community activity and exercise .   PERSONAL FACTORS 1 comorbidity: AVN R 3rd MTT  are also affecting patient's functional outcome.    REHAB POTENTIAL: Excellent  CLINICAL DECISION MAKING: Stable/uncomplicated  EVALUATION COMPLEXITY: Low   GOALS: Goals reviewed with patient? No  SHORT TERM GOALS: Target date: 03/19/2022  Pt will be independent in initial HEP Baseline: Goal status: MET    LONG TERM GOALS: Target date: 04/16/2022  Ind with progressed HEP to improve outcomes.  Baseline: has initial HEP Goal status: IN PROGRESS  2.  Improve knee FOTO to 76% Baseline: 62% Goal status: IN PROGRESS  3.  Ambulate for 1 mile without increased R knee pain Baseline: stopped  walking due to pain.  Goal status: IN PROGRESS  4.  Improve functional strength by completing 5x STS in < 14 seconds Baseline: 13.25 seconds   Goal status: MET  5.  Demonstrate 5/5 RLE strength without pain Baseline: 4+/5 R hip flexion, 5/5 R quad but increased pain Goal status: IN PROGRESS   PLAN: PT FREQUENCY: 1x/week  PT DURATION: 6 weeks  PLANNED INTERVENTIONS: Therapeutic exercises, Therapeutic activity, Neuromuscular re-education, Balance training, Gait training, Patient/Family education, Joint mobilization, and Stair training  PLAN FOR NEXT SESSION:   progress note, extend 1x/week for 4 weeks if needed.    Rennie Natter, PT, DPT  04/10/2022, 2:07 PM

## 2022-04-16 ENCOUNTER — Encounter: Payer: Self-pay | Admitting: Physical Therapy

## 2022-04-16 ENCOUNTER — Ambulatory Visit: Payer: 59 | Admitting: Physical Therapy

## 2022-04-16 DIAGNOSIS — M25661 Stiffness of right knee, not elsewhere classified: Secondary | ICD-10-CM

## 2022-04-16 DIAGNOSIS — R262 Difficulty in walking, not elsewhere classified: Secondary | ICD-10-CM

## 2022-04-16 DIAGNOSIS — R252 Cramp and spasm: Secondary | ICD-10-CM | POA: Diagnosis not present

## 2022-04-16 DIAGNOSIS — M25561 Pain in right knee: Secondary | ICD-10-CM

## 2022-04-16 NOTE — Therapy (Signed)
OUTPATIENT PHYSICAL THERAPY TREATMENT NOTE/PROGRESS NOTE   Progress Note Reporting Period 03/05/2022 to 04/16/2022  See note below for Objective Data and Assessment of Progress/Goals.     Patient Name: Danielle Braun MRN: [id number] DOB:[birth year], 61 y.o., female Today's Date: 04/16/2022   PT End of Session - 04/16/22 0848     Visit Number 7    Number of Visits 11    Date for PT Re-Evaluation 05/18/22    Authorization Type [facility] 25    Progress Note Due on Visit 11    PT Start Time 0847    PT Stop Time 0930    PT Time Calculation (min) 43 min    Activity Tolerance Patient tolerated treatment well    Behavior During Therapy [facility] for tasks assessed/performed           Rationale for Evaluation and Treatment Rehabilitation    Past Medical History:  Diagnosis Date   Acne    Arrhythmia    Bursitis, subacromial    Left   Endometriosis    GERD (gastroesophageal reflux disease)    Retinal tear    Sinusitis    Past Surgical History:  Procedure Laterality Date   ABDOMINAL HYSTERECTOMY     ABDOMINAL SURGERY     csection   DILATION AND CURETTAGE OF UTERUS     SHOULDER ARTHROSCOPY WITH SUBACROMIAL DECOMPRESSION Left 10/31/2017   Procedure: SHOULDER ARTHROSCOPY WITH BURSECTOMY AND SUBACROMIAL DECOMPRESSION;  Surgeon: Tania Ade, MD;  Location: [place];  Service: Orthopedics;  Laterality: Left;  Left shoulder arthroscopy bursectomy and subacromial decompression   TONSILLECTOMY     Patient Active Problem List   Diagnosis Date Noted   Patellofemoral pain syndrome of right knee 01/17/2022    PCP: Jefm Petty, MD  REFERRING PROVIDER: Rosemarie Ax MD  REFERRING DIAG: [phone] (ICD-10-CM) - Patellofemoral pain syndrome of right knee   THERAPY DIAG:  Acute pain of right knee  Stiffness of right knee, not elsewhere classified  Cramp and spasm  Difficulty in walking, not elsewhere classified  ONSET DATE: 12/29/2021  SUBJECTIVE:   SUBJECTIVE  STATEMENT: Patient states her knee still a little sore.    PERTINENT HISTORY: Scoliosis,  L SAD 2019, abdominal surgery; Frieburgs disease R foot (AVN of metatarsal), OA R knee  PAIN:   Are you having pain? Yes: NPRS scale: 2/10 Pain location: R knee  PRECAUTIONS: None   PATIENT GOALS Strengthen knee without damaging it be able to exercise.    OBJECTIVE:   DIAGNOSTIC FINDINGS: From MD note: Independent review of the right knee x-ray from 3/10 shows mild degenerative changes in the medial compartment.  PATIENT SURVEYS:  FOTO 62% knee.  Predicted outcome 76%   04/16/22 FOTO 63% no significant change.   MUSCLE LENGTH: Hamstrings: Right 140 deg; Left 140 deg Quadriceps: Right 60 deg; Left 65 deg  POSTURE:  WNL  PALPATION: Tenderness R LCL and pes anserine  LE ROM:  Active ROM Right 03/05/2022 Left 03/05/2022  Knee flexion 135 135  Knee extension 0 0   (Blank rows = not tested)  LE MMT:  MMT Right 03/05/2022 Left 03/05/2022 Right  04/16/2022  Hip flexion 4+ 5 5  Hip extension     Hip abduction 5 5 5   Hip adduction 5 5 5   Hip internal rotation     Hip external rotation     Knee flexion 5 5 5   Knee extension 5*painful 5 5  Ankle dorsiflexion 5 5 5   Ankle plantarflexion 5 5  5   (Blank rows = not tested)  LOWER EXTREMITY SPECIAL TESTS:    Negative valgus/varus stress tests, neg for meniscus involvement, neg apprehension tests  FUNCTIONAL TESTS:  5 times sit to stand: 16 seconds, pain on 1st rep.  04/16/2022 9.8 seconds, no pain, noted pop first rep  GAIT: Distance walked: 50 Assistive device utilized: None Level of assistance: Complete Independence Comments: no deviation or device, normal gait speed.   TODAY'S TREATMENT: 04/16/2022 Therapeutic Exercise: to improve strength and mobility.  Demo, verbal and tactile cues throughout for technique. Bike x 6 min warm-up - seat 5 Star excursion pattern x 10 bil  Single leg touchdown squats 3 x 10 bil  Step down 4"  step with band pulling valgus to engage lateral glutes, x20  bil  Wall squats with ball 5 x 20 sec hold Quad stretch - at wall challenging, switched back to prone stretch with strap x 30 sec bil   04/10/2022 Therapeutic Exercise: to improve strength and mobility.  Demo, verbal and tactile cues throughout for technique.  Bike L2 x 6 min  Stairs - 2 flights ascending and descending, reports feeling instability with R eccentric control, needs HR.   Step down 4" step with RTB pulling valgus to engage lateral glutes, 2 x 10 - no pain, less clicking  Squat with RTB to bias glutes 2 x 10  Monster walks RTB 4 x 20'  Side step with squat RTB 2 x 20' bil   Fire hydrants 2 x 10 bil - no resistance  Star excursion pattern - taps full 180 deg, x 5 each side.   04/02/2022  Therapeutic Exercise: to improve strength and mobility.  Verbal and tactile cues throughout for technique. Bike L3 x 6 min warm up Calf stretch on balance board x 30 sec bil Balance board - frontal and sagittal x 20 followed by 1 min hold Cybex leg extensions #25 3 x 10 Cybex leg curls 25# 3 x 10   Single leg RDLS 3 x 10 bil - table in front for balance  Manual Therapy: to decrease muscle spasm and pain and improve mobility STM to R distal ITBand/lateral hamstrings    PATIENT EDUCATION:  Education details:  continue HEP Person educated: Patient Education method: Customer service manager Education comprehension: verbalized understanding and returned demonstration   HOME EXERCISE PROGRAM: Access Code: [email or web address]  ASSESSMENT:  CLINICAL IMPRESSION: AZIA TOUTANT is making good progress but still has R knee pain and clicking.  She has met LTG #4 and #5.  Her FOTO has not changed significantly.  Today focused on strengthening and alignment again, she was able to perform exercises without pain or clicking.  She would benefit from extending POC additional 4 weeks to 05/18/22 to meet all goals.   OBJECTIVE IMPAIRMENTS decreased  activity tolerance, decreased endurance, difficulty walking, decreased strength, increased fascial restrictions, increased muscle spasms, impaired flexibility, and pain.   ACTIVITY LIMITATIONS community activity and exercise .   PERSONAL FACTORS 1 comorbidity: AVN R 3rd MTT  are also affecting patient's functional outcome.    REHAB POTENTIAL: Excellent  CLINICAL DECISION MAKING: Stable/uncomplicated  EVALUATION COMPLEXITY: Low   GOALS: Goals reviewed with patient? No  SHORT TERM GOALS: Target date: 03/19/2022  Pt will be independent in initial HEP Baseline: Goal status: MET    LONG TERM GOALS: Target date: 04/16/2022 extended to 05/18/22.  Ind with progressed HEP to improve outcomes.  Baseline: has initial HEP Goal status: IN PROGRESS  2.  Improve  knee FOTO to 76% Baseline: 62% Goal status: IN PROGRESS 04/16/2022 63%  3.  Ambulate for 1 mile without increased R knee pain Baseline: stopped walking due to pain.  Goal status: IN PROGRESS  04/16/2022 - hasn't walked due to time, bikes for 20 min without pain.    4.  Improve functional strength by completing 5x STS in < 14 seconds Baseline: 13.25 seconds   Goal status: MET  5.  Demonstrate 5/5 RLE strength without pain Baseline: 4+/5 R hip flexion, 5/5 R quad but increased pain Goal status: MET   PLAN: PT FREQUENCY: 1x/week  PT DURATION: 6 weeks  PLANNED INTERVENTIONS: Therapeutic exercises, Therapeutic activity, Neuromuscular re-education, Balance training, Gait training, Patient/Family education, Joint mobilization, and Stair training  PLAN FOR NEXT SESSION:    continue knee/hip strengthening.   Rennie Natter, PT, DPT  04/16/2022, 12:11 PM

## 2022-04-17 DIAGNOSIS — M928 Other specified juvenile osteochondrosis: Secondary | ICD-10-CM | POA: Insufficient documentation

## 2022-04-17 DIAGNOSIS — Z1272 Encounter for screening for malignant neoplasm of vagina: Secondary | ICD-10-CM | POA: Diagnosis not present

## 2022-04-17 DIAGNOSIS — Z13820 Encounter for screening for osteoporosis: Secondary | ICD-10-CM | POA: Diagnosis not present

## 2022-04-17 DIAGNOSIS — Z6831 Body mass index (BMI) 31.0-31.9, adult: Secondary | ICD-10-CM | POA: Diagnosis not present

## 2022-04-17 DIAGNOSIS — Z01419 Encounter for gynecological examination (general) (routine) without abnormal findings: Secondary | ICD-10-CM | POA: Diagnosis not present

## 2022-04-23 ENCOUNTER — Ambulatory Visit: Payer: 59

## 2022-04-23 DIAGNOSIS — R262 Difficulty in walking, not elsewhere classified: Secondary | ICD-10-CM

## 2022-04-23 DIAGNOSIS — M25561 Pain in right knee: Secondary | ICD-10-CM

## 2022-04-23 DIAGNOSIS — M25661 Stiffness of right knee, not elsewhere classified: Secondary | ICD-10-CM

## 2022-04-23 DIAGNOSIS — R252 Cramp and spasm: Secondary | ICD-10-CM

## 2022-05-01 NOTE — Therapy (Signed)
OUTPATIENT PHYSICAL THERAPY TREATMENT NOTE         Patient Name: Danielle Braun MRN: [id number] DOB:1961-05-22, 61 y.o., female Today's Date: 05/02/2022   PT End of Session - 05/02/22 0935     Visit Number 9    Number of Visits 11    Date for PT Re-Evaluation 05/18/22    Authorization Type [facility] VL 25    Progress Note Due on Visit 11    PT Start Time 0932    PT Stop Time 1014    PT Time Calculation (min) 42 min    Activity Tolerance Patient tolerated treatment well    Behavior During Therapy Danielle Braun for tasks assessed/performed             Rationale for Evaluation and Treatment Rehabilitation    Past Medical History:  Diagnosis Date   Acne    Arrhythmia    Bursitis, subacromial    Left   Endometriosis    GERD (gastroesophageal reflux disease)    Retinal tear    Sinusitis    Past Surgical History:  Procedure Laterality Date   ABDOMINAL HYSTERECTOMY     ABDOMINAL SURGERY     csection   DILATION AND CURETTAGE OF UTERUS     SHOULDER ARTHROSCOPY WITH SUBACROMIAL DECOMPRESSION Left 10/31/2017   Procedure: SHOULDER ARTHROSCOPY WITH BURSECTOMY AND SUBACROMIAL DECOMPRESSION;  Surgeon: Danielle Broom, MD;  Location: [facility];  Service: Orthopedics;  Laterality: Left;  Left shoulder arthroscopy bursectomy and subacromial decompression   TONSILLECTOMY     Patient Active Problem List   Diagnosis Date Noted   Patellofemoral pain syndrome of right knee 01/17/2022    PCP: Danielle Jacobson, MD  REFERRING PROVIDER: Myra Rude MD  REFERRING DIAG: [phone] (ICD-10-CM) - Patellofemoral pain syndrome of right knee   THERAPY DIAG:  Acute pain of right knee  Stiffness of right knee, not elsewhere classified  Cramp and spasm  Difficulty in walking, not elsewhere classified  ONSET DATE: 12/29/2021  SUBJECTIVE:   SUBJECTIVE STATEMENT: Pt reporting some tightness in R HS. Intermittent twinges of pain.  PERTINENT HISTORY: Scoliosis,  L SAD 2019, abdominal surgery;  Frieburgs disease R foot (AVN of metatarsal), OA R knee  PAIN:   Are you having pain? Yes: NPRS scale: 3/10 Pain location: R knee  PRECAUTIONS: None   PATIENT GOALS Strengthen knee without damaging it be able to exercise.    OBJECTIVE:   DIAGNOSTIC FINDINGS: From MD note: Independent review of the right knee x-ray from 3/10 shows mild degenerative changes in the medial compartment.  PATIENT SURVEYS:  FOTO 62% knee.  Predicted outcome 76%   04/16/22 FOTO 63% no significant change.   MUSCLE LENGTH: Hamstrings: Right 140 deg; Left 140 deg Quadriceps: Right 60 deg; Left 65 deg  POSTURE:  WNL  PALPATION: Tenderness R LCL and pes anserine  LE ROM:  Active ROM Right 03/05/2022 Left 03/05/2022  Knee flexion 135 135  Knee extension 0 0   (Blank rows = not tested)  LE MMT:  MMT Right 03/05/2022 Left 03/05/2022 Right  04/16/2022  Hip flexion 4+ 5 5  Hip extension     Hip abduction 5 5 5   Hip adduction 5 5 5   Hip internal rotation     Hip external rotation     Knee flexion 5 5 5   Knee extension 5*painful 5 5  Ankle dorsiflexion 5 5 5   Ankle plantarflexion 5 5 5    (Blank rows = not tested)  LOWER EXTREMITY SPECIAL TESTS:  Negative valgus/varus stress tests, neg for meniscus involvement, neg apprehension tests  FUNCTIONAL TESTS:  5 times sit to stand: 16 seconds, pain on 1st rep.  04/16/2022 9.8 seconds, no pain, noted pop first rep  GAIT: Distance walked: 50 Assistive device utilized: None Level of assistance: Complete Independence Comments: no deviation or device, normal gait speed.   TODAY'S TREATMENT: 05/02/22 Bike L3 x  6 min HS and ITB stretch with strap R 2x1 min ea SLS opp leg reach to dots 5 reps bil standing on balance disk SLS on balance disc multiple reps bil Fwd step downs from 6' step 2x10 bil Lateral step downs 8' step 2x10 bil Knee flexion 25# BLE 2x10: R/L unilateral 10# 10 reps Knee extension 15#BLE 2x10; 5# R/L unilateral 10  reps   04/23/22 Therapeutic Exercise: Bike L2x70min SLS opp leg reach to dots 10 reps bil standing on balance disk Fwd step downs from 6' step 2x10 bil Lateral step downs 8' step 2x10 bil Knee flexion 25# BLE 2x10: R/L unilateral 10# 10 reps Knee extension 15#BLE 2x10; 5# R/L unilateral 10 reps SLR with ER 2x10 bil  04/16/2022 Therapeutic Exercise: to improve strength and mobility.  Demo, verbal and tactile cues throughout for technique. Bike x 6 min warm-up - seat 5 Star excursion pattern x 10 bil  Single leg touchdown squats 3 x 10 bil  Step down 4" step with band pulling valgus to engage lateral glutes, x20  bil  Wall squats with ball 5 x 20 sec hold Quad stretch - at wall challenging, switched back to prone stretch with strap x 30 sec bil     PATIENT EDUCATION:  Education details:  HEP update 05/02/22 Person educated: Patient Education method: Medical illustrator Education comprehension: verbalized understanding and returned demonstration   HOME EXERCISE PROGRAM: Access Code: [email or web address]  ASSESSMENT:  CLINICAL IMPRESSION: Danielle Braun is progressing very well and should be ready for d/c at the end of her POC. She has walked a mile a few times with some discomfort, but did not have to stop due to pain. Her SLS is improving as well and she has increased stance time on solid ground with vectors without needing to touch ground with her left LE. She is doing well with steps now and no eccentric weakness noted with step downs today.   OBJECTIVE IMPAIRMENTS decreased activity tolerance, decreased endurance, difficulty walking, decreased strength, increased fascial restrictions, increased muscle spasms, impaired flexibility, and pain.   ACTIVITY LIMITATIONS community activity and exercise .   PERSONAL FACTORS 1 comorbidity: AVN R 3rd MTT  are also affecting patient's functional outcome.    REHAB POTENTIAL: Excellent  CLINICAL DECISION MAKING: Stable/uncomplicated  EVALUATION  COMPLEXITY: Low   GOALS: Goals reviewed with patient? No  SHORT TERM GOALS: Target date: 03/19/2022  Pt will be independent in initial HEP Baseline: Goal status: MET    LONG TERM GOALS: Target date: 04/16/2022 extended to 05/18/22.  Ind with progressed HEP to improve outcomes.  Baseline: has initial HEP Goal status: IN PROGRESS  2.  Improve knee FOTO to 76% Baseline: 62% Goal status: IN PROGRESS 04/16/2022 63%  3.  Ambulate for 1 mile without increased R knee pain Baseline: stopped walking due to pain.  Goal status: IN PROGRESS  05/02/22 - has walked a mile with mild discomfort.   4.  Improve functional strength by completing 5x STS in < 14 seconds Baseline: 13.25 seconds   Goal status: MET  5.  Demonstrate 5/5 RLE strength without pain Baseline:  4+/5 R hip flexion, 5/5 R quad but increased pain Goal status: MET   PLAN: PT FREQUENCY: 1x/week  PT DURATION: 6 weeks  PLANNED INTERVENTIONS: Therapeutic exercises, Therapeutic activity, Neuromuscular re-education, Balance training, Gait training, Patient/Family education, Joint mobilization, and Stair training  PLAN FOR NEXT SESSION:    continue knee/hip strengthening.   Delford Wingert, PT 05/02/2022, 10:14 AM

## 2022-05-02 ENCOUNTER — Ambulatory Visit: Payer: 59 | Attending: Family Medicine | Admitting: Physical Therapy

## 2022-05-02 ENCOUNTER — Encounter: Payer: Self-pay | Admitting: Physical Therapy

## 2022-05-02 DIAGNOSIS — M25661 Stiffness of right knee, not elsewhere classified: Secondary | ICD-10-CM | POA: Diagnosis not present

## 2022-05-02 DIAGNOSIS — M25561 Pain in right knee: Secondary | ICD-10-CM

## 2022-05-02 DIAGNOSIS — R262 Difficulty in walking, not elsewhere classified: Secondary | ICD-10-CM | POA: Diagnosis not present

## 2022-05-02 DIAGNOSIS — R252 Cramp and spasm: Secondary | ICD-10-CM

## 2022-05-06 ENCOUNTER — Other Ambulatory Visit (HOSPITAL_COMMUNITY): Payer: Self-pay

## 2022-05-07 ENCOUNTER — Ambulatory Visit: Payer: 59

## 2022-05-07 ENCOUNTER — Other Ambulatory Visit (HOSPITAL_COMMUNITY): Payer: Self-pay

## 2022-05-07 DIAGNOSIS — M25561 Pain in right knee: Secondary | ICD-10-CM | POA: Diagnosis not present

## 2022-05-07 DIAGNOSIS — M25661 Stiffness of right knee, not elsewhere classified: Secondary | ICD-10-CM

## 2022-05-07 DIAGNOSIS — R262 Difficulty in walking, not elsewhere classified: Secondary | ICD-10-CM

## 2022-05-07 DIAGNOSIS — R252 Cramp and spasm: Secondary | ICD-10-CM

## 2022-05-07 NOTE — Therapy (Signed)
OUTPATIENT PHYSICAL THERAPY TREATMENT NOTE         Patient Name: Danielle Braun MRN: [id number] DOB:January 15, 1961, 61 y.o., female Today's Date: 05/07/2022   PT End of Session - 05/07/22 0957     Visit Number 10    Number of Visits 11    Date for PT Re-Evaluation 05/18/22    Authorization Type [facility] VL 25    Progress Note Due on Visit 11    PT Start Time 0930    PT Stop Time 1011    PT Time Calculation (min) 41 min    Activity Tolerance Patient tolerated treatment well    Behavior During Therapy [facility] for tasks assessed/performed              Rationale for Evaluation and Treatment Rehabilitation    Past Medical History:  Diagnosis Date   Acne    Arrhythmia    Bursitis, subacromial    Left   Endometriosis    GERD (gastroesophageal reflux disease)    Retinal tear    Sinusitis    Past Surgical History:  Procedure Laterality Date   ABDOMINAL HYSTERECTOMY     ABDOMINAL SURGERY     csection   DILATION AND CURETTAGE OF UTERUS     SHOULDER ARTHROSCOPY WITH SUBACROMIAL DECOMPRESSION Left 10/31/2017   Procedure: SHOULDER ARTHROSCOPY WITH BURSECTOMY AND SUBACROMIAL DECOMPRESSION;  Surgeon: Tania Ade, MD;  Location: [place];  Service: Orthopedics;  Laterality: Left;  Left shoulder arthroscopy bursectomy and subacromial decompression   TONSILLECTOMY     Patient Active Problem List   Diagnosis Date Noted   Patellofemoral pain syndrome of right knee 01/17/2022    PCP: Jefm Petty, MD  REFERRING PROVIDER: Rosemarie Ax MD  REFERRING DIAG: [phone] (ICD-10-CM) - Patellofemoral pain syndrome of right knee   THERAPY DIAG:  Acute pain of right knee  Stiffness of right knee, not elsewhere classified  Cramp and spasm  Difficulty in walking, not elsewhere classified  ONSET DATE: 12/29/2021  SUBJECTIVE:   SUBJECTIVE STATEMENT: Pt notes improvements in stair navigation and pain levels.  PERTINENT HISTORY: Scoliosis,  L SAD 2019, abdominal surgery;  Frieburgs disease R foot (AVN of metatarsal), OA R knee  PAIN:   Are you having pain? Yes: NPRS scale: 2/10 Pain location: R knee  PRECAUTIONS: None   PATIENT GOALS Strengthen knee without damaging it be able to exercise.    OBJECTIVE:   DIAGNOSTIC FINDINGS: From MD note: Independent review of the right knee x-ray from 3/10 shows mild degenerative changes in the medial compartment.  PATIENT SURVEYS:  FOTO 62% knee.  Predicted outcome 76%   04/16/22 FOTO 63% no significant change.   MUSCLE LENGTH: Hamstrings: Right 140 deg; Left 140 deg Quadriceps: Right 60 deg; Left 65 deg  POSTURE:  WNL  PALPATION: Tenderness R LCL and pes anserine  LE ROM:  Active ROM Right 03/05/2022 Left 03/05/2022  Knee flexion 135 135  Knee extension 0 0   (Blank rows = not tested)  LE MMT:  MMT Right 03/05/2022 Left 03/05/2022 Right  04/16/2022  Hip flexion 4+ 5 5  Hip extension     Hip abduction _0 Hip adduction _1 Hip internal rotation     Hip external rotation     Knee flexion _2 Knee extension 5*painful 5 5  Ankle dorsiflexion _3 Ankle plantarflexion _4 (Blank rows = not tested)  LOWER EXTREMITY SPECIAL TESTS:  Negative valgus/varus stress tests, neg for meniscus involvement, neg apprehension tests  FUNCTIONAL TESTS:  5 times sit to stand: 16 seconds, pain on 1st rep.  04/16/2022 9.8 seconds, no pain, noted pop first rep  GAIT: Distance walked: 50 Assistive device utilized: None Level of assistance: Complete Independence Comments: no deviation or device, normal gait speed.   TODAY'S TREATMENT: 05/07/22 Therapeutic Exercise: Bike L3x56mn Cybex leg press 25# 2x10 BLE; 15# RLE 2x10 Knee extension 25# 2x10 BLE Knee flexion 35# 2x10 BLE Wall squat with VMO ball squeeze 2x10  Manual Therapy: STM to R medial knee, distal ITB, HS, patellar ligament  05/02/22 Bike L3 x  6 min HS and ITB stretch with strap R 2x1 min ea SLS opp leg reach to dots 5 reps  bil standing on balance disk SLS on balance disc multiple reps bil Fwd step downs from 6' step 2x10 bil Lateral step downs 8' step 2x10 bil Knee flexion 25# BLE 2x10: R/L unilateral 10# 10 reps Knee extension 15#BLE 2x10; 5# R/L unilateral 10 reps   04/23/22 Therapeutic Exercise: Bike L2x649m SLS opp leg reach to dots 10 reps bil standing on balance disk Fwd step downs from 6' step 2x10 bil Lateral step downs 8' step 2x10 bil Knee flexion 25# BLE 2x10: R/L unilateral 10# 10 reps Knee extension 15#BLE 2x10; 5# R/L unilateral 10 reps SLR with ER 2x10 bil      PATIENT EDUCATION:  Education details:  HEP update 05/02/22 Person educated: Patient Education method: ExCustomer service managerducation comprehension: verbalized understanding and returned demonstration   HOME EXERCISE PROGRAM: Access Code: [email or web address]:  CLINICAL IMPRESSION: Pt noted that she was able to walk a mile w/o any increased pain. Cueing provided throughout session to correct technique. She tolerated the progression of TE well with no reports of pain but did notice some crepitus in R knee, so finished session with MT.   OBJECTIVE IMPAIRMENTS decreased activity tolerance, decreased endurance, difficulty walking, decreased strength, increased fascial restrictions, increased muscle spasms, impaired flexibility, and pain.   ACTIVITY LIMITATIONS community activity and exercise .   PERSONAL FACTORS 1 comorbidity: AVN R 3rd MTT  are also affecting patient's functional outcome.    REHAB POTENTIAL: Excellent  CLINICAL DECISION MAKING: Stable/uncomplicated  EVALUATION COMPLEXITY: Low   GOALS: Goals reviewed with patient? No  SHORT TERM GOALS: Target date: 03/19/2022  Pt will be independent in initial HEP Baseline: Goal status: MET    LONG TERM GOALS: Target date: 04/16/2022 extended to 05/18/22.  Ind with progressed HEP to improve outcomes.  Baseline: has initial HEP Goal status: IN  PROGRESS  2.  Improve knee FOTO to 76% Baseline: 62% Goal status: IN PROGRESS 04/16/2022 63%  3.  Ambulate for 1 mile without increased R knee pain Baseline: stopped walking due to pain.  Goal status: IN PROGRESS  05/02/22 - has walked a mile with mild discomfort.   4.  Improve functional strength by completing 5x STS in < 14 seconds Baseline: 13.25 seconds   Goal status: MET  5.  Demonstrate 5/5 RLE strength without pain Baseline: 4+/5 R hip flexion, 5/5 R quad but increased pain Goal status: MET   PLAN: PT FREQUENCY: 1x/week  PT DURATION: 6 weeks  PLANNED INTERVENTIONS: Therapeutic exercises, Therapeutic activity, Neuromuscular re-education, Balance training, Gait training, Patient/Family education, Joint mobilization, and Stair training  PLAN FOR NEXT SESSION:  30 day hold; D/C  Gerald Honea L Mihail Prettyman, PTA 05/07/2022, 10:13 AM

## 2022-05-14 ENCOUNTER — Encounter: Payer: 59 | Admitting: Physical Therapy

## 2022-05-16 ENCOUNTER — Ambulatory Visit: Payer: 59

## 2022-05-16 DIAGNOSIS — M25661 Stiffness of right knee, not elsewhere classified: Secondary | ICD-10-CM

## 2022-05-16 DIAGNOSIS — M25561 Pain in right knee: Secondary | ICD-10-CM

## 2022-05-16 DIAGNOSIS — R262 Difficulty in walking, not elsewhere classified: Secondary | ICD-10-CM | POA: Diagnosis not present

## 2022-05-16 DIAGNOSIS — R252 Cramp and spasm: Secondary | ICD-10-CM

## 2022-05-16 NOTE — Therapy (Addendum)
OUTPATIENT PHYSICAL THERAPY TREATMENT NOTE PHYSICAL THERAPY DISCHARGE SUMMARY (06/25/2022)  Visits from Start of Care: 11  Current functional level related to goals / functional outcomes: Improved R knee pain, met or almost met all goals.    Remaining deficits: Occasional mild R knee pain stairs and clicking   Education / Equipment: HEP  Plan: Patient agrees to discharge.  Patient is being discharged due to meeting the stated rehab goals.  She was placed on 30 day hold on 05/16/2022 and has not returned within that time frame.    Rennie Natter, PT, DPT 2:12 PM 06/25/2022    Progress Note Reporting Period 04/16/22 to 05/16/22  See note below for Objective Data and Assessment of Progress/Goals.          Patient Name: Danielle Braun MRN: [id number] DOB:14-Feb-1961, 61 y.o., female Today's Date: 05/16/2022   PT End of Session - 05/16/22 0850     Visit Number 11    Number of Visits 11    Date for PT Re-Evaluation 05/18/22    Authorization Type [facility] VL 25    Progress Note Due on Visit 11    PT Start Time 0800    PT Stop Time 2836    PT Time Calculation (min) 43 min    Activity Tolerance Patient tolerated treatment well    Behavior During Therapy [facility] for tasks assessed/performed               Rationale for Evaluation and Treatment Rehabilitation    Past Medical History:  Diagnosis Date   Acne    Arrhythmia    Bursitis, subacromial    Left   Endometriosis    GERD (gastroesophageal reflux disease)    Retinal tear    Sinusitis    Past Surgical History:  Procedure Laterality Date   ABDOMINAL HYSTERECTOMY     ABDOMINAL SURGERY     csection   DILATION AND CURETTAGE OF UTERUS     SHOULDER ARTHROSCOPY WITH SUBACROMIAL DECOMPRESSION Left 10/31/2017   Procedure: SHOULDER ARTHROSCOPY WITH BURSECTOMY AND SUBACROMIAL DECOMPRESSION;  Surgeon: Tania Ade, MD;  Location: [place];  Service: Orthopedics;  Laterality: Left;  Left shoulder arthroscopy bursectomy  and subacromial decompression   TONSILLECTOMY     Patient Active Problem List   Diagnosis Date Noted   Patellofemoral pain syndrome of right knee 01/17/2022    PCP: Jefm Petty, MD  REFERRING PROVIDER: Rosemarie Ax MD  REFERRING DIAG: [phone] (ICD-10-CM) - Patellofemoral pain syndrome of right knee   THERAPY DIAG:  Acute pain of right knee  Stiffness of right knee, not elsewhere classified  Cramp and spasm  Difficulty in walking, not elsewhere classified  ONSET DATE: 12/29/2021  SUBJECTIVE:   SUBJECTIVE STATEMENT: Pt notes that going downstairs is still mildly difficult and pain gets up to a 5/46 with certain things. PERTINENT HISTORY: Scoliosis,  L SAD 2019, abdominal surgery; Frieburgs disease R foot (AVN of metatarsal), OA R knee  PAIN:   Are you having pain? Yes: NPRS scale: 2/10 Pain location: R knee  PRECAUTIONS: None   PATIENT GOALS Strengthen knee without damaging it be able to exercise.    OBJECTIVE:   DIAGNOSTIC FINDINGS: From MD note: Independent review of the right knee x-ray from 3/10 shows mild degenerative changes in the medial compartment.  PATIENT SURVEYS:  FOTO 62% knee.  Predicted outcome 76%   04/16/22 FOTO 63% no significant change.   MUSCLE LENGTH: Hamstrings: Right 140 deg; Left 140 deg Quadriceps: Right 60 deg;  Left 65 deg  POSTURE:  WNL  PALPATION: Tenderness R LCL and pes anserine  LE ROM:  Active ROM Right 03/05/2022 Left 03/05/2022  Knee flexion 135 135  Knee extension 0 0   (Blank rows = not tested)  LE MMT:  MMT Right 03/05/2022 Left 03/05/2022 Right  04/16/2022  Hip flexion 4+ 5 5  Hip extension     Hip abduction 5 5 5   Hip adduction 5 5 5   Hip internal rotation     Hip external rotation     Knee flexion 5 5 5   Knee extension 5*painful 5 5  Ankle dorsiflexion 5 5 5   Ankle plantarflexion 5 5 5    (Blank rows = not tested)  LOWER EXTREMITY SPECIAL TESTS:    Negative valgus/varus stress tests, neg for  meniscus involvement, neg apprehension tests  FUNCTIONAL TESTS:  5 times sit to stand: 16 seconds, pain on 1st rep.  04/16/2022 9.8 seconds, no pain, noted pop first rep  GAIT: Distance walked: 50 Assistive device utilized: None Level of assistance: Complete Independence Comments: no deviation or device, normal gait speed.   TODAY'S TREATMENT: 05/14/22 Therapeutic Exercise: Bike L3x32mn Assessed remaining goals Completed FOTO  Reviewed HEP with patient, provided demo's and verbal cueing to ensure full understanding of HEP for D/C  05/07/22 Therapeutic Exercise: Bike L3x623m Cybex leg press 25# 2x10 BLE; 15# RLE 2x10 Knee extension 25# 2x10 BLE Knee flexion 35# 2x10 BLE Wall squat with VMO ball squeeze 2x10  Manual Therapy: STM to R medial knee, distal ITB, HS, patellar ligament  05/02/22 Bike L3 x  6 min HS and ITB stretch with strap R 2x1 min ea SLS opp leg reach to dots 5 reps bil standing on balance disk SLS on balance disc multiple reps bil Fwd step downs from 6' step 2x10 bil Lateral step downs 8' step 2x10 bil Knee flexion 25# BLE 2x10: R/L unilateral 10# 10 reps Knee extension 15#BLE 2x10; 5# R/L unilateral 10 reps         PATIENT EDUCATION:  Education details:  HEP update 05/02/22 Person educated: Patient Education method: ExCustomer service managerducation comprehension: verbalized understanding and returned demonstration   HOME EXERCISE PROGRAM: Access Code: [email or web address]  ASSESSMENT:  CLINICAL IMPRESSION: Pt has now met all goals except for her FOTO score (LTG #2) . She is now able to walk for at least 1 mile with no pain or discomfort in R knee. Reviewed HEP for clarification and to discontinue exercises that need progression. Provided instruction with exercises from HEP and ways to progress, giving demo and verbal cues. Pt is pleased with progress and she opted to do 30 day hold.    OBJECTIVE IMPAIRMENTS decreased activity tolerance, decreased  endurance, difficulty walking, decreased strength, increased fascial restrictions, increased muscle spasms, impaired flexibility, and pain.   ACTIVITY LIMITATIONS community activity and exercise .   PERSONAL FACTORS 1 comorbidity: AVN R 3rd MTT  are also affecting patient's functional outcome.    REHAB POTENTIAL: Excellent  CLINICAL DECISION MAKING: Stable/uncomplicated  EVALUATION COMPLEXITY: Low   GOALS: Goals reviewed with patient? No  SHORT TERM GOALS: Target date: 03/19/2022  Pt will be independent in initial HEP Baseline: Goal status: MET    LONG TERM GOALS: Target date: 04/16/2022 extended to 05/18/22.  Ind with progressed HEP to improve outcomes.  Baseline: has initial HEP Goal status: MET - 05/16/22  2.  Improve knee FOTO to 76% Baseline: 62% Goal status: NOT MET - 05/16/22 (75%)  3.  Ambulate  for 1 mile without increased R knee pain Baseline: stopped walking due to pain.  Goal status: MET  05/16/22   4.  Improve functional strength by completing 5x STS in < 14 seconds Baseline: 13.25 seconds   Goal status: MET  5.  Demonstrate 5/5 RLE strength without pain Baseline: 4+/5 R hip flexion, 5/5 R quad but increased pain Goal status: MET   PLAN: PT FREQUENCY: 1x/week  PT DURATION: 6 weeks  PLANNED INTERVENTIONS: Therapeutic exercises, Therapeutic activity, Neuromuscular re-education, Balance training, Gait training, Patient/Family education, Joint mobilization, and Stair training  PLAN FOR NEXT SESSION:  30 day hold; D/C  Artist Pais, PTA 05/16/2022, 8:51 AM

## 2022-05-21 ENCOUNTER — Encounter: Payer: 59 | Admitting: Physical Therapy

## 2022-05-24 ENCOUNTER — Other Ambulatory Visit (HOSPITAL_COMMUNITY): Payer: Self-pay

## 2022-06-14 ENCOUNTER — Encounter: Payer: Self-pay | Admitting: Podiatry

## 2022-06-14 ENCOUNTER — Other Ambulatory Visit (HOSPITAL_COMMUNITY): Payer: Self-pay

## 2022-06-14 ENCOUNTER — Other Ambulatory Visit: Payer: Self-pay | Admitting: Podiatry

## 2022-06-14 MED ORDER — METHYLPREDNISOLONE 4 MG PO TBPK
ORAL_TABLET | ORAL | 0 refills | Status: DC
  Filled 2022-06-14: qty 21, 6d supply, fill #0

## 2022-06-14 MED ORDER — MELOXICAM 15 MG PO TABS
15.0000 mg | ORAL_TABLET | Freq: Every day | ORAL | 0 refills | Status: DC
  Filled 2022-06-14: qty 30, 30d supply, fill #0

## 2022-06-14 NOTE — Progress Notes (Unsigned)
Subjective:  Patient ID: Danielle Braun, female    DOB: 1960/11/27,  MRN: [id number]  No chief complaint on file.   61 y.o. female presents with the above complaint.  Patient presents with follow-up of right third metatarsal Freiberg disease.  She states that she is doing well.  Her pain has gone down considerably.  She has gone into regular shoes as well.  Some tenderness.  She would like to know if she is ready to go to her daughter's wedding.  Review of Systems: Negative except as noted in the HPI. Denies N/V/F/Ch.  Past Medical History:  Diagnosis Date   Acne    Arrhythmia    Bursitis, subacromial    Left   Endometriosis    GERD (gastroesophageal reflux disease)    Retinal tear    Sinusitis     Current Outpatient Medications:    meloxicam (MOBIC) 15 MG tablet, Take 1 tablet (15 mg total) by mouth daily., Disp: 30 tablet, Rfl: 0   methylPREDNISolone (MEDROL DOSEPAK) 4 MG TBPK tablet, Take as directed, Disp: 21 each, Rfl: 0   acetaminophen (TYLENOL) 500 MG tablet, Take 1,000 mg by mouth every 6 (six) hours as needed for moderate pain or headache., Disp: , Rfl:    Cholecalciferol (VITAMIN D3) 2000 units TABS, Take 2,000 Units by mouth daily., Disp: , Rfl:    COVID-19 At Home Antigen Test [facility] COVID-19 HOME TEST) KIT, Use as directed, Disp: 2 each, Rfl: 0   cyclobenzaprine (FLEXERIL) 5 MG tablet, Take 5 mg by mouth at bedtime as needed for muscle spasms. , Disp: , Rfl:    dextromethorphan-guaiFENesin (MUCINEX DM) 30-600 MG 12hr tablet, Take 1 tablet by mouth 2 (two) times daily., Disp: , Rfl:    diphenhydrAMINE (BENADRYL) 25 mg capsule, Take 25 mg by mouth at bedtime as needed., Disp: , Rfl:    estradiol (ESTRACE) 0.1 MG/GM vaginal cream, Place 1 Applicatorful vaginally at bedtime., Disp: , Rfl:    famotidine (PEPCID) 20 MG tablet, Take 1 tablet by mouth 2 (two) times daily. (Patient not taking: Reported on 03/05/2022), Disp: , Rfl:    famotidine (PEPCID) 20 MG tablet, TAKE 1  TABLET BY MOUTH TWICE DAILY, Disp: 180 tablet, Rfl: 3   famotidine (PEPCID) 20 MG tablet, TAKE 1 TABLET BY MOUTH TWICE DAILY, Disp: 180 tablet, Rfl: 3   fexofenadine (ALLEGRA) 60 MG tablet, Take 60 mg by mouth daily as needed. , Disp: , Rfl:    fluticasone (FLONASE) 50 MCG/ACT nasal spray, Place 2 sprays into both nostrils daily.  (Patient not taking: Reported on 03/05/2022), Disp: , Rfl:    fluticasone (FLONASE) 50 MCG/ACT nasal spray, USE 2 SPRAYS IN EACH NOSTRIL ONCE A DAY, Disp: 48 g, Rfl: 3   fluticasone (FLONASE) 50 MCG/ACT nasal spray, USE 2 SPRAYS IN EACH NOSTRIL ONCE A DAY, Disp: 48 g, Rfl: 3   ipratropium (ATROVENT) 0.03 % nasal spray, Place 2 sprays into both nostrils every 12 (twelve) hours., Disp: 30 mL, Rfl: 0   montelukast (SINGULAIR) 10 MG tablet, Take 1 tablet (10 mg total) by mouth daily., Disp: 90 tablet, Rfl: 1   PEG 3350-KCl-NaBcb-NaCl-NaSulf (PEG-3350/ELECTROLYTES) 236 g SOLR, Use as directed, Disp: 4000 mL, Rfl: 0   traMADol (ULTRAM) 50 MG tablet, Take 50 mg by mouth 2 (two) times daily as needed for moderate pain. , Disp: , Rfl:    venlafaxine XR (EFFEXOR-XR) 37.5 MG 24 hr capsule, Take 1 capsule by mouth daily. (Patient not taking: Reported on 03/05/2022), Disp: ,  Rfl:    venlafaxine XR (EFFEXOR-XR) 37.5 MG 24 hr capsule, TAKE 1 CAPSULE BY MOUTH ONCE A DAY, Disp: 90 capsule, Rfl: 3   venlafaxine XR (EFFEXOR-XR) 37.5 MG 24 hr capsule, Take 1 capsule by mouth daily., Disp: 90 capsule, Rfl: 1  Social History   Tobacco Use  Smoking Status Former   Types: Cigarettes  Smokeless Tobacco Never    No Known Allergies Objective:  There were no vitals filed for this visit. There is no height or weight on file to calculate BMI. Constitutional Well developed. Well nourished.  Vascular Dorsalis pedis pulses palpable bilaterally. Posterior tibial pulses palpable bilaterally. Capillary refill normal to all digits.  No cyanosis or clubbing noted. Pedal hair growth normal.   Neurologic Normal speech. Oriented to person, place, and time. Epicritic sensation to light touch grossly present bilaterally.  Dermatologic Nails well groomed and normal in appearance. No open wounds. No skin lesions.  Orthopedic: No further pain on palpation of right third metatarsophalangeal joint.  Negative Mulder's click noted.  No further pain with range of motion of the third MTPJ joint.  Deep intra-articular third MTPJ joint noted.  Negative extensor or flexor tendinitis.  No plantar plate rupture noted.   Radiographs: 3 views of skeletally mature adult right foot: Right third metatarsal head flattening with cystic changes noted consistent with Freiberg's disease dorsal soft tissue edema noted.  Moderate plantar calcaneal spurring noted. Assessment:   No diagnosis found.   Plan:  Patient was evaluated and treated and all questions answered.  Right third metatarsophalangeal joint Freiberg disease avascular necrosis -All questions or concerns were discussed with the patient in extensive detail -At this time I believe at this time she would benefit from intermittent using cam boot as needed..  At this time I will clinically continue to monitor it.  I discussed with her that she may need surgical intervention with Hemi implant replacement if there is no improvement.  She states understanding. -We will transition to regular shoes and continue using the regular shoes.  Her pain has clinically improved considerably as she does not have much pain.  Occasional swelling once in a while. -Medrol Dosepak and Mobic was sent for in case if the pain flares up. -If there is no improvement we will discuss MRI and a possible plan for surgical Hemi implant.  No follow-ups on file.

## 2022-06-21 DIAGNOSIS — J343 Hypertrophy of nasal turbinates: Secondary | ICD-10-CM | POA: Diagnosis not present

## 2022-06-21 DIAGNOSIS — J310 Chronic rhinitis: Secondary | ICD-10-CM | POA: Diagnosis not present

## 2022-06-21 DIAGNOSIS — J342 Deviated nasal septum: Secondary | ICD-10-CM | POA: Diagnosis not present

## 2022-06-25 DIAGNOSIS — H33312 Horseshoe tear of retina without detachment, left eye: Secondary | ICD-10-CM | POA: Diagnosis not present

## 2022-07-31 ENCOUNTER — Other Ambulatory Visit (HOSPITAL_COMMUNITY): Payer: Self-pay

## 2022-08-01 ENCOUNTER — Other Ambulatory Visit (HOSPITAL_COMMUNITY): Payer: Self-pay

## 2022-08-01 MED ORDER — VENLAFAXINE HCL ER 37.5 MG PO CP24
37.5000 mg | ORAL_CAPSULE | Freq: Every day | ORAL | 0 refills | Status: DC
  Filled 2022-08-01: qty 30, 30d supply, fill #0

## 2022-08-15 ENCOUNTER — Other Ambulatory Visit (HOSPITAL_COMMUNITY): Payer: Self-pay

## 2022-08-15 DIAGNOSIS — E663 Overweight: Secondary | ICD-10-CM | POA: Diagnosis not present

## 2022-08-15 DIAGNOSIS — R6889 Other general symptoms and signs: Secondary | ICD-10-CM | POA: Diagnosis not present

## 2022-08-15 DIAGNOSIS — R030 Elevated blood-pressure reading, without diagnosis of hypertension: Secondary | ICD-10-CM | POA: Diagnosis not present

## 2022-08-15 DIAGNOSIS — E559 Vitamin D deficiency, unspecified: Secondary | ICD-10-CM | POA: Diagnosis not present

## 2022-08-15 MED ORDER — VENLAFAXINE HCL ER 37.5 MG PO CP24
37.5000 mg | ORAL_CAPSULE | Freq: Every day | ORAL | 1 refills | Status: DC
  Filled 2022-08-15 – 2022-08-31 (×2): qty 90, 90d supply, fill #0
  Filled 2022-11-29: qty 90, 90d supply, fill #1

## 2022-08-16 ENCOUNTER — Other Ambulatory Visit (HOSPITAL_COMMUNITY): Payer: Self-pay

## 2022-08-24 ENCOUNTER — Encounter: Payer: Self-pay | Admitting: Emergency Medicine

## 2022-08-24 ENCOUNTER — Ambulatory Visit (INDEPENDENT_AMBULATORY_CARE_PROVIDER_SITE_OTHER): Payer: 59

## 2022-08-24 ENCOUNTER — Ambulatory Visit
Admission: EM | Admit: 2022-08-24 | Discharge: 2022-08-24 | Disposition: A | Discharge status: Home / Self Care | Payer: 59 | Attending: Family Medicine | Admitting: Family Medicine

## 2022-08-24 DIAGNOSIS — M545 Low back pain, unspecified: Secondary | ICD-10-CM | POA: Diagnosis not present

## 2022-08-24 DIAGNOSIS — M4126 Other idiopathic scoliosis, lumbar region: Secondary | ICD-10-CM | POA: Diagnosis not present

## 2022-08-24 MED ORDER — CYCLOBENZAPRINE HCL 5 MG PO TABS: 5.0000 mg | ORAL_TABLET | Freq: Every evening | ORAL | 0 refills | Status: DC | PRN

## 2022-08-24 NOTE — Discharge Instructions (Signed)
Resume taking Mobic '15mg'$  daily as prescribed for about a week.  Apply ice pack for 20 to 30 minutes, 2 to 3 times daily  Continue until pain decreases

## 2022-08-24 NOTE — ED Provider Notes (Signed)
Danielle Braun CARE    CSN: [id number] Arrival date & time: 08/24/22  2094      History   Chief Complaint Chief Complaint  Patient presents with   Back Pain    HPI Danielle Braun is a 61 y.o. female.   Last night at bedtime patient developed localized non-radiating aching pain in her left lower back, worse when she is supine.  She does not recall any injury or change in activities prior to onset of the pain.  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  She denies urinary symptoms, rash, and feels well otherwise.  She recalls that in the distant past she would experience similar pain during mid-cycle almost every month.  She denies abdominal/pelvic pain, vaginal bleeding or discharge.  Review of past records reveals MR lumbar spine w/o contrast done 04/21/10 that showed mild degenerative changes L4-5 and L5-S1 without nerve compression.  The history is provided by the patient.  Back Pain Location:  Lumbar spine Quality:  Aching Radiates to:  Does not radiate Pain severity:  Mild Pain is:  Same all the time Onset quality:  Sudden Duration:  1 day Timing:  Constant Progression:  Unchanged Chronicity:  New Context: not lifting heavy objects, not MVA and not recent injury   Relieved by:  Nothing Worsened by:  Lying down Associated symptoms: no abdominal pain, no abdominal swelling, no bladder incontinence, no bowel incontinence, no dysuria, no fever, no leg pain, no paresthesias, no pelvic pain, no perianal numbness and no weakness     Past Medical History:  Diagnosis Date   Acne    Arrhythmia    Bursitis, subacromial    Left   Endometriosis    GERD (gastroesophageal reflux disease)    Retinal tear    Sinusitis     Patient Active Problem List   Diagnosis Date Noted   Patellofemoral pain syndrome of right knee 01/17/2022    Past Surgical History:  Procedure Laterality Date   ABDOMINAL HYSTERECTOMY     ABDOMINAL SURGERY      csection   DILATION AND CURETTAGE OF UTERUS     SHOULDER ARTHROSCOPY WITH SUBACROMIAL DECOMPRESSION Left 10/31/2017   Procedure: SHOULDER ARTHROSCOPY WITH BURSECTOMY AND SUBACROMIAL DECOMPRESSION;  Surgeon: Tania Ade, MD;  Location: [facility];  Service: Orthopedics;  Laterality: Left;  Left shoulder arthroscopy bursectomy and subacromial decompression   TONSILLECTOMY      OB History   No obstetric history on file.      Home Medications    Prior to Admission medications   Medication Sig Start Date End Date Taking? Authorizing Provider  acetaminophen (TYLENOL) 500 MG tablet Take 1,000 mg by mouth every 6 (six) hours as needed for moderate pain or headache.    [provider]  Cholecalciferol (VITAMIN D3) 2000 units TABS Take 2,000 Units by mouth daily.    [provider]  COVID-19 At Home Antigen Test [facility] COVID-19 HOME TEST) KIT Use as directed 11/18/21   Jefm Bryant, RPH  cyclobenzaprine (FLEXERIL) 5 MG tablet Take 1 tablet (5 mg total) by mouth at bedtime as needed for muscle spasms. 08/24/22   Kandra Nicolas, MD  dextromethorphan-guaiFENesin (MUCINEX DM) 30-600 MG 12hr tablet Take 1 tablet by mouth 2 (two) times daily. Patient not taking: Reported on 08/24/2022    [provider]  diphenhydrAMINE (BENADRYL) 25 mg capsule Take 25 mg by mouth at bedtime as needed. Patient not taking: Reported on 08/24/2022  [provider]  estradiol (ESTRACE) 0.1 MG/GM vaginal cream Place 1 Applicatorful vaginally at bedtime.    [provider]  famotidine (PEPCID) 20 MG tablet Take 1 tablet by mouth 2 (two) times daily. Patient not taking: Reported on 03/05/2022 01/18/21   [provider]  famotidine (PEPCID) 20 MG tablet TAKE 1 TABLET BY MOUTH TWICE DAILY Patient not taking: Reported on 08/24/2022 01/18/20 01/17/21  Jefm Petty, MD  famotidine (PEPCID) 20 MG tablet TAKE 1 TABLET BY MOUTH TWICE DAILY 01/26/22     fexofenadine (ALLEGRA)  60 MG tablet Take 60 mg by mouth daily as needed.     [provider]  fluticasone (FLONASE) 50 MCG/ACT nasal spray Place 2 sprays into both nostrils daily.  Patient not taking: Reported on 03/05/2022    [provider]  fluticasone (FLONASE) 50 MCG/ACT nasal spray USE 2 SPRAYS IN EACH NOSTRIL ONCE A DAY Patient not taking: Reported on 08/24/2022 01/18/20 01/17/21  Jefm Petty, MD  fluticasone [facility]) 50 MCG/ACT nasal spray USE 2 SPRAYS IN EACH NOSTRIL ONCE A DAY 01/26/22     ipratropium (ATROVENT) 0.03 % nasal spray Place 2 sprays into both nostrils every 12 (twelve) hours. 12/15/21   Montine Circle, PA-C  meloxicam (MOBIC) 15 MG tablet Take 1 tablet  by mouth daily. Patient not taking: Reported on 08/24/2022 06/14/22   Felipa Furnace, DPM  methylPREDNISolone (MEDROL DOSEPAK) 4 MG TBPK tablet Take as directed Patient not taking: Reported on 08/24/2022 06/14/22   Felipa Furnace, DPM  montelukast (SINGULAIR) 10 MG tablet Take 1 tablet (10 mg total) by mouth daily. Patient not taking: Reported on 08/24/2022 11/09/21     PEG 3350-KCl-NaBcb-NaCl-NaSulf (PEG-3350/ELECTROLYTES) 236 g SOLR Use as directed Patient not taking: Reported on 08/24/2022 05/03/21     traMADol (ULTRAM) 50 MG tablet Take 50 mg by mouth 2 (two) times daily as needed for moderate pain.  Patient not taking: Reported on 08/24/2022    [provider]  venlafaxine XR (EFFEXOR-XR) 37.5 MG 24 hr capsule Take 1 capsule by mouth daily. Patient not taking: Reported on 03/05/2022 01/18/21   [provider]  venlafaxine XR (EFFEXOR-XR) 37.5 MG 24 hr capsule TAKE 1 CAPSULE BY MOUTH ONCE A DAY Patient not taking: Reported on 08/24/2022 01/18/21 01/21/22  Jefm Petty, MD  venlafaxine XR (EFFEXOR-XR) 37.5 MG 24 hr capsule Take 1 capsule (37.5 mg total) by mouth daily. 08/15/22       Family History Family History  Problem Relation Age of Onset   Hyperlipidemia Mother    Cancer Father        bladder    Diabetes Brother    Breast cancer Neg Hx     Social History Social History   Tobacco Use   Smoking status: Former    Types: Cigarettes   Smokeless tobacco: Never  Vaping Use   Vaping Use: Never used  Substance Use Topics   Alcohol use: Yes    Comment: occasional   Drug use: No     Allergies   Patient has no known allergies.   Review of Systems Review of Systems  Constitutional:  Negative for activity change, appetite change, chills, diaphoresis, fatigue and fever.  Gastrointestinal:  Negative for abdominal distention, abdominal pain, bowel incontinence, constipation, diarrhea and nausea.  Genitourinary:  Negative for bladder incontinence, dysuria, flank pain, frequency, pelvic pain, vaginal bleeding and vaginal discharge.  Musculoskeletal:  Positive for back pain.  Skin:  Negative for rash.  Neurological:  Negative for weakness  and paresthesias.  All other systems reviewed and are negative.    Physical Exam Triage Vital Signs ED Triage Vitals [08/24/22 0837]  Enc Vitals Group     BP 129/84     Pulse Rate 82     Resp 15     Temp 99 F (37.2 C)     Temp Source Oral     SpO2 98 %     Weight      Height      Head Circumference      Peak Flow      Pain Score      Pain Loc      Pain Edu?      Excl. in [place]?    No data found.  Updated Vital Signs BP 129/84 (BP Location: Left Arm)   Pulse 82   Temp 99 F (37.2 C) (Oral)   Resp 15   SpO2 98%   Visual Acuity Right Eye Distance:   Left Eye Distance:   Bilateral Distance:    Right Eye Near:   Left Eye Near:    Bilateral Near:     Physical Exam Vitals and nursing note reviewed.  Constitutional:      General: She is not in acute distress. HENT:     Head: Normocephalic.     Mouth/Throat:     Mouth: Mucous membranes are moist.  Eyes:     Pupils: Pupils are equal, round, and reactive to light.  Cardiovascular:     Rate and Rhythm: Normal rate and regular rhythm.     Heart sounds: Normal heart  sounds.  Pulmonary:     Breath sounds: Normal breath sounds.  Abdominal:     Palpations: Abdomen is soft.     Tenderness: There is no abdominal tenderness.  Musculoskeletal:       Back:     Right lower leg: No edema.     Left lower leg: No edema.     Comments: Back:  Range of motion relatively well preserved.  Can heel/toe walk and squat without difficulty.  Vague tenderness to palpation midline and left paraspinous areas at levels noted on diagram. Straight leg raising test is negative.  Sitting knee extension test is negative.  Strength and sensation in the lower extremities is normal.  Patellar and achilles reflexes are normal   Skin:    General: Skin is warm and dry.     Findings: No rash.  Neurological:     Mental Status: She is alert and oriented to person, place, and time.      UC Treatments / Results  Labs (all labs ordered are listed, but only abnormal results are displayed) Labs Reviewed - No data to display  EKG   Radiology DG Lumbar Spine Complete  Result Date: 08/24/2022 CLINICAL DATA:  Left low back pain EXAM: LUMBAR SPINE - COMPLETE 4+ VIEW COMPARISON:  CT 07/27/2021 FINDINGS: Mild thoracolumbar dextroscoliosis apex L1 as before. No fracture or dislocation. Mild narrowing of the L4-5 interspace. Early facet DJD L4-S1. Old healed fracture deformity right twelfth rib. IMPRESSION: 1. No acute findings. 2. Mild spondylitic changes L4-S1. 3. Mild thoracolumbar dextroscoliosis. Electronically Signed   By: Lucrezia Europe M.D.   On: 08/24/2022 09:43    Procedures Procedures (including critical care time)  Medications Ordered in UC Medications - No data to display  Initial Impression / Assessment and Plan / UC Course  I have reviewed the triage vital signs and the nursing notes.  Pertinent labs &  imaging results that were available during my care of the patient were reviewed by me and considered in my medical decision making (see chart for details).    Treat  conservatively for now with NSAID and Flexeril. Followup with Family Doctor if not improved in about 2 weeks.  Final Clinical Impressions(s) / UC Diagnoses   Final diagnoses:  Acute left-sided low back pain without sciatica     Discharge Instructions      Resume taking Mobic 83m daily as prescribed for about a week.  Apply ice pack for 20 to 30 minutes, 2 to 3 times daily  Continue until pain decreases     ED Prescriptions     Medication Sig Dispense Auth. Provider   cyclobenzaprine (FLEXERIL) 5 MG tablet Take 1 tablet (5 mg total) by mouth at bedtime as needed for muscle spasms. 15 tablet BKandra Nicolas MD         BKandra Nicolas MD 08/26/22 [phone]

## 2022-08-24 NOTE — ED Triage Notes (Signed)
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

## 2022-08-31 ENCOUNTER — Other Ambulatory Visit (HOSPITAL_COMMUNITY): Payer: Self-pay

## 2022-09-27 ENCOUNTER — Other Ambulatory Visit (HOSPITAL_COMMUNITY): Payer: Self-pay

## 2022-10-01 ENCOUNTER — Other Ambulatory Visit: Payer: Self-pay | Admitting: Family Medicine

## 2022-10-01 DIAGNOSIS — Z1231 Encounter for screening mammogram for malignant neoplasm of breast: Secondary | ICD-10-CM

## 2022-10-10 DIAGNOSIS — H43813 Vitreous degeneration, bilateral: Secondary | ICD-10-CM | POA: Diagnosis not present

## 2022-10-10 DIAGNOSIS — H5213 Myopia, bilateral: Secondary | ICD-10-CM | POA: Diagnosis not present

## 2022-10-10 DIAGNOSIS — H52203 Unspecified astigmatism, bilateral: Secondary | ICD-10-CM | POA: Diagnosis not present

## 2022-10-10 DIAGNOSIS — H524 Presbyopia: Secondary | ICD-10-CM | POA: Diagnosis not present

## 2022-10-10 DIAGNOSIS — H02831 Dermatochalasis of right upper eyelid: Secondary | ICD-10-CM | POA: Diagnosis not present

## 2022-10-10 DIAGNOSIS — H2513 Age-related nuclear cataract, bilateral: Secondary | ICD-10-CM | POA: Diagnosis not present

## 2022-10-10 DIAGNOSIS — H02834 Dermatochalasis of left upper eyelid: Secondary | ICD-10-CM | POA: Diagnosis not present

## 2022-10-10 DIAGNOSIS — H31002 Unspecified chorioretinal scars, left eye: Secondary | ICD-10-CM | POA: Diagnosis not present

## 2022-10-10 DIAGNOSIS — H33312 Horseshoe tear of retina without detachment, left eye: Secondary | ICD-10-CM | POA: Diagnosis not present

## 2022-10-18 DIAGNOSIS — L820 Inflamed seborrheic keratosis: Secondary | ICD-10-CM | POA: Diagnosis not present

## 2022-10-18 DIAGNOSIS — L649 Androgenic alopecia, unspecified: Secondary | ICD-10-CM | POA: Diagnosis not present

## 2022-10-18 DIAGNOSIS — D225 Melanocytic nevi of trunk: Secondary | ICD-10-CM | POA: Diagnosis not present

## 2022-10-18 DIAGNOSIS — D2239 Melanocytic nevi of other parts of face: Secondary | ICD-10-CM | POA: Diagnosis not present

## 2022-10-18 DIAGNOSIS — L821 Other seborrheic keratosis: Secondary | ICD-10-CM | POA: Diagnosis not present

## 2022-10-18 DIAGNOSIS — D2271 Melanocytic nevi of right lower limb, including hip: Secondary | ICD-10-CM | POA: Diagnosis not present

## 2022-11-06 DIAGNOSIS — M8580 Other specified disorders of bone density and structure, unspecified site: Secondary | ICD-10-CM | POA: Diagnosis not present

## 2022-11-06 DIAGNOSIS — M8589 Other specified disorders of bone density and structure, multiple sites: Secondary | ICD-10-CM | POA: Diagnosis not present

## 2022-11-06 DIAGNOSIS — Z8262 Family history of osteoporosis: Secondary | ICD-10-CM | POA: Insufficient documentation

## 2022-11-06 DIAGNOSIS — E669 Obesity, unspecified: Secondary | ICD-10-CM | POA: Diagnosis not present

## 2022-11-06 DIAGNOSIS — M9271 Juvenile osteochondrosis of metatarsus, right foot: Secondary | ICD-10-CM | POA: Diagnosis not present

## 2022-11-06 DIAGNOSIS — K219 Gastro-esophageal reflux disease without esophagitis: Secondary | ICD-10-CM | POA: Diagnosis not present

## 2022-11-06 DIAGNOSIS — Z780 Asymptomatic menopausal state: Secondary | ICD-10-CM | POA: Diagnosis not present

## 2022-11-07 ENCOUNTER — Encounter: Payer: Self-pay | Admitting: Podiatry

## 2022-11-07 ENCOUNTER — Ambulatory Visit (INDEPENDENT_AMBULATORY_CARE_PROVIDER_SITE_OTHER): Payer: Commercial Managed Care - PPO | Admitting: Podiatry

## 2022-11-07 VITALS — BP 128/68

## 2022-11-07 DIAGNOSIS — J302 Other seasonal allergic rhinitis: Secondary | ICD-10-CM | POA: Insufficient documentation

## 2022-11-07 DIAGNOSIS — M722 Plantar fascial fibromatosis: Secondary | ICD-10-CM | POA: Diagnosis not present

## 2022-11-07 NOTE — Progress Notes (Signed)
Subjective:  Patient ID: Danielle Braun, female    DOB: 20-Jun-1961,  MRN: [id number]  Chief Complaint  Patient presents with   Foot Pain    Pt stated that she is doing well she does have some discomfort on the side of her right heel but stated she does have a history of plantar fascitis     62 y.o. female presents with the above complaint.  Patient presents with new complaint of right heel pain that has been going for quite some time is causing her discomfort on the side of the heel.  She does have a history of Planter fasciitis.  She states she is doing okay from the fevers disease.  She wanted to get it evaluated.  Denies any other acute complaints.  Hurts with ambulation hurts with pressure.   Review of Systems: Negative except as noted in the HPI. Denies N/V/F/Ch.  Past Medical History:  Diagnosis Date   Acne    Arrhythmia    Bursitis, subacromial    Left   Endometriosis    GERD (gastroesophageal reflux disease)    Retinal tear    Sinusitis     Current Outpatient Medications:    Calcium Citrate-Vitamin D 315-250 MG-UNIT TABS, calcium citrate 315 mg calcium-vitamin D3 6.25 mcg (250 unit) tablet, Disp: , Rfl:    Methylcellulose, Laxative, 500 MG TABS, Take 1 tablet by mouth as needed., Disp: , Rfl:    naproxen sodium (ALEVE) 220 MG tablet, Take by mouth., Disp: , Rfl:    acetaminophen (TYLENOL) 500 MG tablet, Take 1,000 mg by mouth every 6 (six) hours as needed for moderate pain or headache., Disp: , Rfl:    Cholecalciferol (VITAMIN D3) 2000 units TABS, Take 2,000 Units by mouth daily., Disp: , Rfl:    COVID-19 At Home Antigen Test [facility] COVID-19 HOME TEST) KIT, Use as directed, Disp: 2 each, Rfl: 0   COVID-19 mRNA vaccine 2023-2024 (COMIRNATY) syringe, Inject into the muscle., Disp: 0.3 mL, Rfl: 0   cyclobenzaprine (FLEXERIL) 5 MG tablet, Take 1 tablet (5 mg total) by mouth at bedtime as needed for muscle spasms., Disp: 15 tablet, Rfl: 0   dextromethorphan-guaiFENesin  (MUCINEX DM) 30-600 MG 12hr tablet, Take 1 tablet by mouth 2 (two) times daily. (Patient not taking: Reported on 08/24/2022), Disp: , Rfl:    diphenhydrAMINE (BENADRYL) 25 mg capsule, Take 25 mg by mouth at bedtime as needed. (Patient not taking: Reported on 08/24/2022), Disp: , Rfl:    estradiol (ESTRACE) 0.1 MG/GM vaginal cream, Place 1 Applicatorful vaginally at bedtime., Disp: , Rfl:    famotidine (PEPCID) 20 MG tablet, Take 1 tablet by mouth 2 (two) times daily. (Patient not taking: Reported on 03/05/2022), Disp: , Rfl:    famotidine (PEPCID) 20 MG tablet, TAKE 1 TABLET BY MOUTH TWICE DAILY (Patient not taking: Reported on 08/24/2022), Disp: 180 tablet, Rfl: 3   famotidine (PEPCID) 20 MG tablet, TAKE 1 TABLET BY MOUTH TWICE DAILY, Disp: 180 tablet, Rfl: 3   fexofenadine (ALLEGRA) 60 MG tablet, Take 60 mg by mouth daily as needed. , Disp: , Rfl:    fluticasone (FLONASE) 50 MCG/ACT nasal spray, Place 2 sprays into both nostrils daily.  (Patient not taking: Reported on 03/05/2022), Disp: , Rfl:    fluticasone (FLONASE) 50 MCG/ACT nasal spray, USE 2 SPRAYS IN EACH NOSTRIL ONCE A DAY (Patient not taking: Reported on 08/24/2022), Disp: 48 g, Rfl: 3   fluticasone (FLONASE) 50 MCG/ACT nasal spray, USE 2 SPRAYS IN EACH NOSTRIL ONCE  A DAY, Disp: 48 g, Rfl: 3   ipratropium (ATROVENT) 0.03 % nasal spray, Place 2 sprays into both nostrils every 12 (twelve) hours., Disp: 30 mL, Rfl: 0   meloxicam (MOBIC) 15 MG tablet, Take 1 tablet  by mouth daily. (Patient not taking: Reported on 08/24/2022), Disp: 30 tablet, Rfl: 0   methylPREDNISolone (MEDROL DOSEPAK) 4 MG TBPK tablet, Take as directed (Patient not taking: Reported on 08/24/2022), Disp: 21 each, Rfl: 0   Minoxidil (ROGAINE MENS EXTRA STRENGTH) 5 % FOAM, , Disp: , Rfl:    montelukast (SINGULAIR) 10 MG tablet, Take 1 tablet (10 mg total) by mouth daily. (Patient not taking: Reported on 08/24/2022), Disp: 90 tablet, Rfl: 1   PEG 3350-KCl-NaBcb-NaCl-NaSulf  (PEG-3350/ELECTROLYTES) 236 g SOLR, Use as directed (Patient not taking: Reported on 08/24/2022), Disp: 4000 mL, Rfl: 0   traMADol (ULTRAM) 50 MG tablet, Take 50 mg by mouth 2 (two) times daily as needed for moderate pain.  (Patient not taking: Reported on 08/24/2022), Disp: , Rfl:    venlafaxine XR (EFFEXOR-XR) 37.5 MG 24 hr capsule, Take 1 capsule by mouth daily. (Patient not taking: Reported on 03/05/2022), Disp: , Rfl:    venlafaxine XR (EFFEXOR-XR) 37.5 MG 24 hr capsule, TAKE 1 CAPSULE BY MOUTH ONCE A DAY (Patient not taking: Reported on 08/24/2022), Disp: 90 capsule, Rfl: 3   venlafaxine XR (EFFEXOR-XR) 37.5 MG 24 hr capsule, Take 1 capsule (37.5 mg total) by mouth daily., Disp: 90 capsule, Rfl: 1  Social History   Tobacco Use  Smoking Status Former   Types: Cigarettes  Smokeless Tobacco Never    Allergies  Allergen Reactions   Influenza Virus Vaccine Other (See Comments)    Shoulder injury related to vaccine administration.  Currently not receiving flu vaccines annually due to injury.   Objective:   Vitals:   11/07/22 0833  BP: 128/68   There is no height or weight on file to calculate BMI. Constitutional Well developed. Well nourished.  Vascular Dorsalis pedis pulses palpable bilaterally. Posterior tibial pulses palpable bilaterally. Capillary refill normal to all digits.  No cyanosis or clubbing noted. Pedal hair growth normal.  Neurologic Normal speech. Oriented to person, place, and time. Epicritic sensation to light touch grossly present bilaterally.  Dermatologic Nails well groomed and normal in appearance. No open wounds. No skin lesions.  Orthopedic: Normal joint ROM without pain or crepitus bilaterally. No visible deformities. Tender to palpation at the calcaneal tuber right. No pain with calcaneal squeeze bilaterally. Ankle ROM diminished range of motion right. Silfverskiold Test: positive right.   Radiographs: None  Assessment:   1. Plantar fasciitis  of right foot    Plan:  Patient was evaluated and treated and all questions answered.  Plantar Fasciitis, right - XR reviewed as above.  - Educated on icing and stretching. Instructions given.  - Injection delivered to the plantar fascia as below. - DME: Plantar fascial brace dispensed to support the medial longitudinal arch of the foot and offload pressure from the heel and prevent arch collapse during weightbearing - Pharmacologic management: None  Procedure: Injection Tendon/Ligament Location: Right plantar fascia at the glabrous junction; medial approach. Skin Prep: alcohol Injectate: 0.5 cc 0.5% marcaine plain, 0.5 cc of 1% Lidocaine, 0.5 cc kenalog 10. Disposition: Patient tolerated procedure well. Injection site dressed with a band-aid.  No follow-ups on file.

## 2022-11-12 ENCOUNTER — Other Ambulatory Visit (HOSPITAL_BASED_OUTPATIENT_CLINIC_OR_DEPARTMENT_OTHER): Payer: Self-pay

## 2022-11-12 MED ORDER — COMIRNATY 30 MCG/0.3ML IM SUSY
PREFILLED_SYRINGE | INTRAMUSCULAR | 0 refills | Status: DC
  Filled 2022-11-12: qty 0.3, 1d supply, fill #0

## 2022-11-14 ENCOUNTER — Encounter: Payer: Self-pay | Admitting: Podiatry

## 2022-11-29 ENCOUNTER — Other Ambulatory Visit (HOSPITAL_COMMUNITY): Payer: Self-pay

## 2022-12-05 ENCOUNTER — Ambulatory Visit: Payer: Commercial Managed Care - PPO | Admitting: Podiatry

## 2022-12-05 DIAGNOSIS — M722 Plantar fascial fibromatosis: Secondary | ICD-10-CM

## 2022-12-05 DIAGNOSIS — Q666 Other congenital valgus deformities of feet: Secondary | ICD-10-CM | POA: Diagnosis not present

## 2022-12-05 NOTE — Progress Notes (Signed)
Subjective:  Patient ID: Danielle Braun, female    DOB: 1961/08/29,  MRN: [id number]  Chief Complaint  Patient presents with   Plantar Fasciitis    Pt stated that she feels like it is better every now and then she gets a slight discomfort     62 y.o. female presents with the above complaint.  Patient presents for follow-up to right Planter fasciitis/heel pain.  She states she is doing a lot better the injection helped.  Bracing helped.  She denies any other acute complaints.  She does not have any orthotics.   Review of Systems: Negative except as noted in the HPI. Denies N/V/F/Ch.  Past Medical History:  Diagnosis Date   Acne    Arrhythmia    Bursitis, subacromial    Left   Endometriosis    GERD (gastroesophageal reflux disease)    Retinal tear    Sinusitis     Current Outpatient Medications:    acetaminophen (TYLENOL) 500 MG tablet, Take 1,000 mg by mouth every 6 (six) hours as needed for moderate pain or headache., Disp: , Rfl:    Calcium Citrate-Vitamin D 315-250 MG-UNIT TABS, calcium citrate 315 mg calcium-vitamin D3 6.25 mcg (250 unit) tablet, Disp: , Rfl:    Cholecalciferol (VITAMIN D3) 2000 units TABS, Take 2,000 Units by mouth daily., Disp: , Rfl:    COVID-19 At Home Antigen Test [facility] COVID-19 HOME TEST) KIT, Use as directed, Disp: 2 each, Rfl: 0   COVID-19 mRNA vaccine 2023-2024 (COMIRNATY) syringe, Inject into the muscle., Disp: 0.3 mL, Rfl: 0   cyclobenzaprine (FLEXERIL) 5 MG tablet, Take 1 tablet (5 mg total) by mouth at bedtime as needed for muscle spasms., Disp: 15 tablet, Rfl: 0   dextromethorphan-guaiFENesin (MUCINEX DM) 30-600 MG 12hr tablet, Take 1 tablet by mouth 2 (two) times daily. (Patient not taking: Reported on 08/24/2022), Disp: , Rfl:    diphenhydrAMINE (BENADRYL) 25 mg capsule, Take 25 mg by mouth at bedtime as needed. (Patient not taking: Reported on 08/24/2022), Disp: , Rfl:    estradiol (ESTRACE) 0.1 MG/GM vaginal cream, Place 1 Applicatorful  vaginally at bedtime., Disp: , Rfl:    famotidine (PEPCID) 20 MG tablet, Take 1 tablet by mouth 2 (two) times daily. (Patient not taking: Reported on 03/05/2022), Disp: , Rfl:    famotidine (PEPCID) 20 MG tablet, TAKE 1 TABLET BY MOUTH TWICE DAILY (Patient not taking: Reported on 08/24/2022), Disp: 180 tablet, Rfl: 3   famotidine (PEPCID) 20 MG tablet, TAKE 1 TABLET BY MOUTH TWICE DAILY, Disp: 180 tablet, Rfl: 3   fexofenadine (ALLEGRA) 60 MG tablet, Take 60 mg by mouth daily as needed. , Disp: , Rfl:    fluticasone (FLONASE) 50 MCG/ACT nasal spray, Place 2 sprays into both nostrils daily.  (Patient not taking: Reported on 03/05/2022), Disp: , Rfl:    fluticasone (FLONASE) 50 MCG/ACT nasal spray, USE 2 SPRAYS IN EACH NOSTRIL ONCE A DAY (Patient not taking: Reported on 08/24/2022), Disp: 48 g, Rfl: 3   fluticasone (FLONASE) 50 MCG/ACT nasal spray, USE 2 SPRAYS IN EACH NOSTRIL ONCE A DAY, Disp: 48 g, Rfl: 3   ipratropium (ATROVENT) 0.03 % nasal spray, Place 2 sprays into both nostrils every 12 (twelve) hours., Disp: 30 mL, Rfl: 0   meloxicam (MOBIC) 15 MG tablet, Take 1 tablet  by mouth daily. (Patient not taking: Reported on 08/24/2022), Disp: 30 tablet, Rfl: 0   Methylcellulose, Laxative, 500 MG TABS, Take 1 tablet by mouth as needed., Disp: , Rfl:  methylPREDNISolone (MEDROL DOSEPAK) 4 MG TBPK tablet, Take as directed (Patient not taking: Reported on 08/24/2022), Disp: 21 each, Rfl: 0   Minoxidil (ROGAINE MENS EXTRA STRENGTH) 5 % FOAM, , Disp: , Rfl:    montelukast (SINGULAIR) 10 MG tablet, Take 1 tablet (10 mg total) by mouth daily. (Patient not taking: Reported on 08/24/2022), Disp: 90 tablet, Rfl: 1   naproxen sodium (ALEVE) 220 MG tablet, Take by mouth., Disp: , Rfl:    PEG 3350-KCl-NaBcb-NaCl-NaSulf (PEG-3350/ELECTROLYTES) 236 g SOLR, Use as directed (Patient not taking: Reported on 08/24/2022), Disp: 4000 mL, Rfl: 0   traMADol (ULTRAM) 50 MG tablet, Take 50 mg by mouth 2 (two) times daily as  needed for moderate pain.  (Patient not taking: Reported on 08/24/2022), Disp: , Rfl:    venlafaxine XR (EFFEXOR-XR) 37.5 MG 24 hr capsule, Take 1 capsule by mouth daily. (Patient not taking: Reported on 03/05/2022), Disp: , Rfl:    venlafaxine XR (EFFEXOR-XR) 37.5 MG 24 hr capsule, TAKE 1 CAPSULE BY MOUTH ONCE A DAY (Patient not taking: Reported on 08/24/2022), Disp: 90 capsule, Rfl: 3   venlafaxine XR (EFFEXOR-XR) 37.5 MG 24 hr capsule, Take 1 capsule (37.5 mg total) by mouth daily., Disp: 90 capsule, Rfl: 1  Social History   Tobacco Use  Smoking Status Former   Types: Cigarettes  Smokeless Tobacco Never    Allergies  Allergen Reactions   Influenza Virus Vaccine Other (See Comments)    Shoulder injury related to vaccine administration.  Currently not receiving flu vaccines annually due to injury.   Objective:   There were no vitals filed for this visit.  There is no height or weight on file to calculate BMI. Constitutional Well developed. Well nourished.  Vascular Dorsalis pedis pulses palpable bilaterally. Posterior tibial pulses palpable bilaterally. Capillary refill normal to all digits.  No cyanosis or clubbing noted. Pedal hair growth normal.  Neurologic Normal speech. Oriented to person, place, and time. Epicritic sensation to light touch grossly present bilaterally.  Dermatologic Nails well groomed and normal in appearance. No open wounds. No skin lesions.  Orthopedic: Normal joint ROM without pain or crepitus bilaterally. No visible deformities. Tender to palpation at the calcaneal tuber right. No pain with calcaneal squeeze bilaterally. Ankle ROM diminished range of motion right. Silfverskiold Test: positive right.   Radiographs: None  Assessment:   No diagnosis found.  Plan:  Patient was evaluated and treated and all questions answered.  Plantar Fasciitis, right - XR reviewed as above.  - Educated on icing and stretching. Instructions given.  - second  Injection delivered to the plantar fascia as below. - DME: Continue wearing plantar fascial brace.  Night splint was dispensed - Pharmacologic management: None  Pes planovalgus -I explained to patient the etiology of pes planovalgus and relationship with Planter fasciitis and various treatment options were discussed.  Given patient foot structure in the setting of Planter fasciitis I believe patient will benefit from custom-made orthotics to help control the hindfoot motion support the arch of the foot and take the stress away from plantar fascial.  Patient agrees with the plan like to proceed with orthotics -Patient was casted for orthotics   Procedure: Injection Tendon/Ligament Location: Right plantar fascia at the glabrous junction; medial approach. Skin Prep: alcohol Injectate: 0.5 cc 0.5% marcaine plain, 0.5 cc of 1% Lidocaine, 0.5 cc kenalog 10. Disposition: Patient tolerated procedure well. Injection site dressed with a band-aid.  No follow-ups on file.

## 2022-12-07 ENCOUNTER — Ambulatory Visit
Admission: RE | Admit: 2022-12-07 | Discharge: 2022-12-07 | Disposition: A | Discharge status: Home / Self Care | Payer: Commercial Managed Care - PPO | Source: Ambulatory Visit | Attending: Family Medicine | Admitting: Family Medicine

## 2022-12-07 DIAGNOSIS — Z1231 Encounter for screening mammogram for malignant neoplasm of breast: Secondary | ICD-10-CM | POA: Diagnosis not present

## 2022-12-20 DIAGNOSIS — K219 Gastro-esophageal reflux disease without esophagitis: Secondary | ICD-10-CM | POA: Diagnosis not present

## 2022-12-21 DIAGNOSIS — D485 Neoplasm of uncertain behavior of skin: Secondary | ICD-10-CM | POA: Diagnosis not present

## 2022-12-25 ENCOUNTER — Other Ambulatory Visit (HOSPITAL_COMMUNITY): Payer: Self-pay

## 2022-12-25 ENCOUNTER — Other Ambulatory Visit: Payer: Self-pay

## 2022-12-25 DIAGNOSIS — R1013 Epigastric pain: Secondary | ICD-10-CM | POA: Diagnosis not present

## 2022-12-25 DIAGNOSIS — R0602 Shortness of breath: Secondary | ICD-10-CM | POA: Diagnosis not present

## 2022-12-25 DIAGNOSIS — Z2911 Encounter for prophylactic immunotherapy for respiratory syncytial virus (RSV): Secondary | ICD-10-CM | POA: Diagnosis not present

## 2022-12-25 DIAGNOSIS — R5383 Other fatigue: Secondary | ICD-10-CM | POA: Diagnosis not present

## 2022-12-25 DIAGNOSIS — R609 Edema, unspecified: Secondary | ICD-10-CM | POA: Diagnosis not present

## 2022-12-25 MED ORDER — OMEPRAZOLE 20 MG PO CPDR
20.0000 mg | DELAYED_RELEASE_CAPSULE | Freq: Every day | ORAL | 0 refills | Status: DC
  Filled 2022-12-25: qty 90, 90d supply, fill #0

## 2022-12-31 ENCOUNTER — Other Ambulatory Visit (HOSPITAL_BASED_OUTPATIENT_CLINIC_OR_DEPARTMENT_OTHER): Payer: Self-pay

## 2022-12-31 DIAGNOSIS — R1013 Epigastric pain: Secondary | ICD-10-CM | POA: Diagnosis not present

## 2022-12-31 MED ORDER — AREXVY 120 MCG/0.5ML IM SUSR
INTRAMUSCULAR | 0 refills | Status: DC
  Filled 2022-12-31: qty 1, 1d supply, fill #0

## 2023-01-14 ENCOUNTER — Encounter: Payer: Self-pay | Admitting: Podiatry

## 2023-01-17 ENCOUNTER — Ambulatory Visit: Payer: Commercial Managed Care - PPO | Admitting: Podiatry

## 2023-02-06 ENCOUNTER — Other Ambulatory Visit: Payer: Commercial Managed Care - PPO

## 2023-02-07 ENCOUNTER — Other Ambulatory Visit (HOSPITAL_COMMUNITY): Payer: Self-pay

## 2023-02-07 ENCOUNTER — Other Ambulatory Visit: Payer: Self-pay

## 2023-02-07 DIAGNOSIS — L2089 Other atopic dermatitis: Secondary | ICD-10-CM | POA: Diagnosis not present

## 2023-02-07 MED ORDER — HYDROCORTISONE 2.5 % EX OINT
1.0000 | TOPICAL_OINTMENT | Freq: Two times a day (BID) | CUTANEOUS | 1 refills | Status: DC
  Filled 2023-02-07: qty 28.35, 14d supply, fill #0

## 2023-02-11 ENCOUNTER — Encounter: Payer: Self-pay | Admitting: *Deleted

## 2023-02-23 ENCOUNTER — Other Ambulatory Visit (HOSPITAL_COMMUNITY): Payer: Self-pay

## 2023-02-25 ENCOUNTER — Other Ambulatory Visit: Payer: Self-pay

## 2023-02-25 ENCOUNTER — Other Ambulatory Visit (HOSPITAL_COMMUNITY): Payer: Self-pay

## 2023-02-25 MED ORDER — VENLAFAXINE HCL ER 37.5 MG PO CP24
37.5000 mg | ORAL_CAPSULE | Freq: Every day | ORAL | 0 refills | Status: DC
  Filled 2023-02-25: qty 90, 90d supply, fill #0

## 2023-03-14 ENCOUNTER — Other Ambulatory Visit (HOSPITAL_COMMUNITY): Payer: Self-pay

## 2023-03-14 MED ORDER — OMEPRAZOLE 20 MG PO CPDR
20.0000 mg | DELAYED_RELEASE_CAPSULE | Freq: Every day | ORAL | 0 refills | Status: DC
  Filled 2023-03-14: qty 90, 90d supply, fill #0

## 2023-03-20 ENCOUNTER — Ambulatory Visit (INDEPENDENT_AMBULATORY_CARE_PROVIDER_SITE_OTHER): Payer: Commercial Managed Care - PPO | Admitting: Internal Medicine

## 2023-03-26 ENCOUNTER — Encounter: Payer: Self-pay | Admitting: Nurse Practitioner

## 2023-03-26 ENCOUNTER — Ambulatory Visit (INDEPENDENT_AMBULATORY_CARE_PROVIDER_SITE_OTHER): Payer: Commercial Managed Care - PPO | Admitting: Nurse Practitioner

## 2023-03-26 VITALS — BP 116/69 | HR 79 | Temp 97.8°F | Ht 65.5 in | Wt 187.0 lb

## 2023-03-26 DIAGNOSIS — E669 Obesity, unspecified: Secondary | ICD-10-CM

## 2023-03-26 DIAGNOSIS — E559 Vitamin D deficiency, unspecified: Secondary | ICD-10-CM

## 2023-03-26 DIAGNOSIS — Z0289 Encounter for other administrative examinations: Secondary | ICD-10-CM

## 2023-03-26 DIAGNOSIS — Z6830 Body mass index (BMI) 30.0-30.9, adult: Secondary | ICD-10-CM | POA: Diagnosis not present

## 2023-03-26 NOTE — Progress Notes (Signed)
Office: [phone]  /  Fax: [phone]   Initial Visit  Danielle Braun was seen in clinic today to evaluate for obesity. She is interested in losing weight to improve overall health and reduce the risk of weight related complications. She presents today to review program treatment options, initial physical assessment, and evaluation.     She was referred by: Self-Referral  When asked what else they would like to accomplish? She states: Adopt healthier eating patterns, Improve quality of life, and Lose a target amount of weight : Goal weight:  160 lbs  Weight history:  She started gaining weight during/after pregnancy 1991 and started gaining excess weight after having her second child in 1994.  Her weight has fluctuated over the years. Has gotten worse since COVID.     When asked how has your weight affected you? She states: Contributed to orthopedic problems or mobility issues  Some associated conditions: PAC, PVC, allergies, GERD, osteoporosis, Freiberg's disease, right knee pain, HLD, Vit d def, PF  Contributing factors: Family history, Nutritional, and Pregnancy  Weight promoting medications identified: None  Current nutrition plan: None  Current level of physical activity: Bike, rowing machine  Current or previous pharmacotherapy: None  Response to medication: Never tried medications   Past medical history includes:   Past Medical History:  Diagnosis Date   Acne    Arrhythmia    Bursitis, subacromial    Left   Endometriosis    GERD (gastroesophageal reflux disease)    Retinal tear    Sinusitis      Objective:   BP 116/69   Pulse 79   Temp 97.8 F (36.6 C)   Ht 5' 5.5" (1.664 m)   Wt 187 lb (84.8 kg)   SpO2 97%   BMI 30.65 kg/m  She was weighed on the bioimpedance scale: Body mass index is 30.65 kg/m.  Peak Weight:192 lbs , Body Fat%:42.5 %, Visceral Fat Rating:11, Weight trend over the last 12 months: maintained   General:  Alert, oriented and  cooperative. Patient is in no acute distress.  Respiratory: Normal respiratory effort, no problems with respiration noted   Gait: able to ambulate independently  Mental Status: Normal mood and affect. Normal behavior. Normal judgment and thought content.   DIAGNOSTIC DATA REVIEWED:  BMET    Component Value Date/Time   NA 139 10/17/2017 0920   K 4.1 10/17/2017 0920   CL 106 10/17/2017 0920   CO2 27 10/17/2017 0920   GLUCOSE 90 10/17/2017 0920   BUN 15 10/17/2017 0920   CREATININE 0.73 10/17/2017 0920   CALCIUM 9.3 10/17/2017 0920   GFRNONAA >60 10/17/2017 0920   GFRAA >60 10/17/2017 0920   No results found for: "HGBA1C" No results found for: "INSULIN" CBC    Component Value Date/Time   WBC 4.3 10/17/2017 0920   RBC 4.26 10/17/2017 0920   HGB 13.5 10/17/2017 0920   HCT 40.1 10/17/2017 0920   PLT 282 10/17/2017 0920   MCV 94.1 10/17/2017 0920   MCH 31.7 10/17/2017 0920   MCHC 33.7 10/17/2017 0920   RDW 12.7 10/17/2017 0920   Iron/TIBC/Ferritin/ %Sat No results found for: "IRON", "TIBC", "FERRITIN", "IRONPCTSAT" Lipid Panel  No results found for: "CHOL", "TRIG", "HDL", "CHOLHDL", "VLDL", "LDLCALC", "LDLDIRECT" Hepatic Function Panel     Component Value Date/Time   PROT 7.4 10/17/2017 0920   ALBUMIN 4.2 10/17/2017 0920   AST 22 10/17/2017 0920   ALT 18 10/17/2017 0920   ALKPHOS 56 10/17/2017 0920  BILITOT 0.6 10/17/2017 0920   No results found for: "TSH"   Assessment and Plan:   Vitamin D insufficiency  Generalized obesity  BMI 30.0-30.9,adult        Obesity Treatment / Action Plan:  Patient will work on garnering support from family and friends to begin weight loss journey. Will work on eliminating or reducing the presence of highly palatable, calorie dense foods in the home. Will complete provided nutritional and psychosocial assessment questionnaire before the next appointment. Will be scheduled for indirect calorimetry to determine resting energy  expenditure in a fasting state.  This will allow [place] to create a reduced calorie, high-protein meal plan to promote loss of fat mass while preserving muscle mass. Counseled on the health benefits of losing 5%-15% of total body weight. Was counseled on nutritional approaches to weight loss and benefits of reducing processed foods and consuming plant-based foods and high quality protein as part of nutritional weight management. Was counseled on pharmacotherapy and role as an adjunct in weight management.   Obesity Education Performed Today:  She was weighed on the bioimpedance scale and results were discussed and documented in the synopsis.  We discussed obesity as a disease and the importance of a more detailed evaluation of all the factors contributing to the disease.  We discussed the importance of long term lifestyle changes which include nutrition, exercise and behavioral modifications as well as the importance of customizing this to her specific health and social needs.  We discussed the benefits of reaching a healthier weight to alleviate the symptoms of existing conditions and reduce the risks of the biomechanical, metabolic and psychological effects of obesity.  Danielle Braun appears to be in the action stage of change and states they are ready to start intensive lifestyle modifications and behavioral modifications.  30 minutes was spent today on this visit including the above counseling, pre-visit chart review, and post-visit documentation.  Reviewed by clinician on day of visit: allergies, medications, problem list, medical history, surgical history, family history, social history, and previous encounter notes pertinent to obesity diagnosis.    Theodis Sato Kevontay Burks FNP-C

## 2023-04-16 ENCOUNTER — Ambulatory Visit: Payer: Commercial Managed Care - PPO | Admitting: Bariatrics

## 2023-04-16 ENCOUNTER — Encounter: Payer: Self-pay | Admitting: Bariatrics

## 2023-04-16 VITALS — BP 135/77 | HR 68 | Temp 97.7°F | Ht 65.5 in | Wt 188.0 lb

## 2023-04-16 DIAGNOSIS — E669 Obesity, unspecified: Secondary | ICD-10-CM

## 2023-04-16 DIAGNOSIS — Z6830 Body mass index (BMI) 30.0-30.9, adult: Secondary | ICD-10-CM | POA: Diagnosis not present

## 2023-04-16 DIAGNOSIS — Z1331 Encounter for screening for depression: Secondary | ICD-10-CM | POA: Diagnosis not present

## 2023-04-16 DIAGNOSIS — Z0000 Encounter for general adult medical examination without abnormal findings: Secondary | ICD-10-CM | POA: Insufficient documentation

## 2023-04-16 DIAGNOSIS — R5383 Other fatigue: Secondary | ICD-10-CM | POA: Diagnosis not present

## 2023-04-16 DIAGNOSIS — K219 Gastro-esophageal reflux disease without esophagitis: Secondary | ICD-10-CM

## 2023-04-16 DIAGNOSIS — E559 Vitamin D deficiency, unspecified: Secondary | ICD-10-CM

## 2023-04-16 DIAGNOSIS — R0602 Shortness of breath: Secondary | ICD-10-CM | POA: Diagnosis not present

## 2023-04-16 DIAGNOSIS — Z833 Family history of diabetes mellitus: Secondary | ICD-10-CM | POA: Diagnosis not present

## 2023-04-17 ENCOUNTER — Encounter (INDEPENDENT_AMBULATORY_CARE_PROVIDER_SITE_OTHER): Payer: Self-pay | Admitting: Bariatrics

## 2023-04-17 ENCOUNTER — Encounter: Payer: Self-pay | Admitting: Bariatrics

## 2023-04-17 DIAGNOSIS — Z860100 Personal history of colon polyps, unspecified: Secondary | ICD-10-CM | POA: Insufficient documentation

## 2023-04-17 DIAGNOSIS — R7303 Prediabetes: Secondary | ICD-10-CM | POA: Insufficient documentation

## 2023-04-17 LAB — CBC WITH DIFFERENTIAL/PLATELET
Basophils Absolute: 0.1 10*3/uL (ref 0.0–0.2)
Basos: 2 %
EOS (ABSOLUTE): 0.2 10*3/uL (ref 0.0–0.4)
Eos: 5 %
Hematocrit: 38.5 % (ref 34.0–46.6)
Hemoglobin: 12.9 g/dL (ref 11.1–15.9)
Immature Grans (Abs): 0 10*3/uL (ref 0.0–0.1)
Immature Granulocytes: 0 %
Lymphocytes Absolute: 1.6 10*3/uL (ref 0.7–3.1)
Lymphs: 32 %
MCH: 31.2 pg (ref 26.6–33.0)
MCHC: 33.5 g/dL (ref 31.5–35.7)
MCV: 93 fL (ref 79–97)
Monocytes Absolute: 0.6 10*3/uL (ref 0.1–0.9)
Monocytes: 12 %
Neutrophils Absolute: 2.6 10*3/uL (ref 1.4–7.0)
Neutrophils: 49 %
Platelets: 278 10*3/uL (ref 150–450)
RBC: 4.14 x10E6/uL (ref 3.77–5.28)
RDW: 12.3 % (ref 11.7–15.4)
WBC: 5.2 10*3/uL (ref 3.4–10.8)

## 2023-04-17 LAB — LIPID PANEL WITH LDL/HDL RATIO
Cholesterol, Total: 207 mg/dL — ABNORMAL HIGH (ref 100–199)
HDL: 71 mg/dL (ref >39)
LDL Chol Calc (NIH): 120 mg/dL — ABNORMAL HIGH (ref 0–99)
LDL/HDL Ratio: 1.7 ratio (ref 0.0–3.2)
Triglycerides: 88 mg/dL (ref 0–149)
VLDL Cholesterol Cal: 16 mg/dL (ref 5–40)

## 2023-04-17 LAB — COMPREHENSIVE METABOLIC PANEL
ALT: 18 IU/L (ref 0–32)
AST: 22 IU/L (ref 0–40)
Albumin: 4.2 g/dL (ref 3.8–4.9)
Alkaline Phosphatase: 74 IU/L (ref 44–121)
BUN/Creatinine Ratio: 20 (ref 12–28)
BUN: 14 mg/dL (ref 8–27)
Bilirubin Total: 0.4 mg/dL (ref 0.0–1.2)
CO2: 24 mmol/L (ref 20–29)
Calcium: 9.2 mg/dL (ref 8.7–10.3)
Chloride: 102 mmol/L (ref 96–106)
Creatinine, Ser: 0.69 mg/dL (ref 0.57–1.00)
Globulin, Total: 2.5 g/dL (ref 1.5–4.5)
Glucose: 91 mg/dL (ref 70–99)
Potassium: 4.2 mmol/L (ref 3.5–5.2)
Sodium: 139 mmol/L (ref 134–144)
Total Protein: 6.7 g/dL (ref 6.0–8.5)
eGFR: 99 mL/min/{1.73_m2} (ref >59)

## 2023-04-17 LAB — VITAMIN B12: Vitamin B-12: 386 pg/mL (ref 232–1245)

## 2023-04-17 LAB — TSH+T4F+T3FREE
Free T4: 1.06 ng/dL (ref 0.82–1.77)
T3, Free: 3.1 pg/mL (ref 2.0–4.4)
TSH: 1.68 u[IU]/mL (ref 0.450–4.500)

## 2023-04-17 LAB — HEMOGLOBIN A1C
Est. average glucose Bld gHb Est-mCnc: 117 mg/dL
Hgb A1c MFr Bld: 5.7 % — ABNORMAL HIGH (ref 4.8–5.6)

## 2023-04-17 LAB — INSULIN, RANDOM: INSULIN: 9.7 u[IU]/mL (ref 2.6–24.9)

## 2023-04-17 LAB — VITAMIN D 25 HYDROXY (VIT D DEFICIENCY, FRACTURES): Vit D, 25-Hydroxy: 51.7 ng/mL (ref 30.0–100.0)

## 2023-04-17 NOTE — Progress Notes (Signed)
Chief Complaint:   OBESITY Danielle Braun (MR# [id number]) is a 62 y.o. female who presents for evaluation and treatment of obesity and related comorbidities. Current BMI is Body mass index is 30.81 kg/m. Danielle Braun has been struggling with her weight for many years and has been unsuccessful in either losing weight, maintaining weight loss, or reaching her healthy weight goal.  Danielle Braun is currently in the action stage of change and ready to dedicate time achieving and maintaining a healthier weight. Danielle Braun is interested in becoming our patient and working on intensive lifestyle modifications including (but not limited to) diet and exercise for weight loss.  Patient was seen by Judeth Cornfield, nurse practitioner on 03/26/2023 for an information session.   Danielle Braun's habits were reviewed today and are as follows: Her family eats meals together, her desired weight loss is 28 lbs, she started gaining weight after her second child, her heaviest weight ever was 192 pounds, she has significant food cravings issues, she snacks frequently in the evenings, she wakes up frequently in the middle of the night to eat, she is frequently drinking liquids with calories, and she has problems with excessive hunger.  Depression Screen Danielle Braun's Food and Mood (modified PHQ-9) score was 3.  Subjective:   1. Other fatigue Danielle Braun admits to daytime somnolence and denies waking up still tired. Patient has a history of symptoms of daytime fatigue. Penni generally gets 5 or 7 hours of sleep per night, and states that she has generally restful sleep. Snoring is present. Apneic episodes are not present. Epworth Sleepiness Score is 8.   2. SOB (shortness of breath) on exertion Danielle Braun notes increasing shortness of breath with exercising and seems to be worsening over time with weight gain. She notes getting out of breath sooner with activity than she used to. This has not gotten worse recently. Danielle Braun denies shortness of  breath at rest or orthopnea.  3. Gastroesophageal reflux disease without esophagitis Patient is taking omeprazole.   4. Health care maintenance Given obesity.   5. Family history of diabetes mellitus Patient has a history of type II diabetes mellitus with her brother and grandmother.   6. Vitamin D insufficiency Patient is taking a supplement.   Assessment/Plan:   1. Other fatigue Danielle Braun does feel that her weight is causing her energy to be lower than it should be. Fatigue may be related to obesity, depression or many other causes. Labs will be ordered, and in the meanwhile, Danielle Braun will focus on self care including making healthy food choices, increasing physical activity and focusing on stress reduction.  - EKG 12-Lead - TSH+T4F+T3Free  2. SOB (shortness of breath) on exertion Danielle Braun does feel that she gets out of breath more easily that she used to when she exercises. Danielle Braun's shortness of breath appears to be obesity related and exercise induced. She has agreed to work on weight loss and gradually increase exercise to treat her exercise induced shortness of breath. Will continue to monitor closely.  3. Gastroesophageal reflux disease without esophagitis Patient will continue her medications. We discussed the relation with obesity.   4. Health care maintenance We will check labs today. EKG and IC results were reviewed with the patient today.   - Insulin, random - Lipid Panel With LDL/HDL Ratio - CBC with Differential/Platelet - Hemoglobin A1c - TSH+T4F+T3Free - Comprehensive metabolic panel - Vitamin B12 - VITAMIN D 25 Hydroxy (Vit-D Deficiency, Fractures)  5. Family history of diabetes mellitus We will check labs today, and we  will follow-up at her next visit.   - Insulin, random - Hemoglobin A1c  6. Vitamin D insufficiency We will check labs today, and we will follow-up at her next visit.   - VITAMIN D 25 Hydroxy (Vit-D Deficiency, Fractures)  7. Depression  screening Danielle Braun had a negative depression screening.   8. Generalized obesity - Insulin, random - Lipid Panel With LDL/HDL Ratio - CBC with Differential/Platelet - Hemoglobin A1c - TSH+T4F+T3Free - Comprehensive metabolic panel - Vitamin B12 - VITAMIN D 25 Hydroxy (Vit-D Deficiency, Fractures)  9. BMI 30.0-30.9,adult Danielle Braun is currently in the action stage of change and her goal is to continue with weight loss efforts. I recommend Danielle Braun begin the structured treatment plan as follows:  She has agreed to the Category 2 Plan.  Meal planning and intentional eating were discussed.   Exercise goals: No exercise has been prescribed at this time.   Behavioral modification strategies: increasing lean protein intake, decreasing simple carbohydrates, increasing vegetables, increasing water intake, decreasing eating out, no skipping meals, meal planning and cooking strategies, keeping healthy foods in the home, and planning for success.  She was informed of the importance of frequent follow-up visits to maximize her success with intensive lifestyle modifications for her multiple health conditions. She was informed we would discuss her lab results at her next visit unless there is a critical issue that needs to be addressed sooner. Danielle Braun agreed to keep her next visit at the agreed upon time to discuss these results.  Objective:   Blood pressure 135/77, pulse 68, temperature 97.7 F (36.5 C), height 5' 5.5" (1.664 m), weight 188 lb (85.3 kg), SpO2 98 %. Body mass index is 30.81 kg/m.  EKG: Normal sinus rhythm, rate 66 BPM.  Indirect Calorimeter completed today shows a VO2 of 226 and a REE of 1555.  Her calculated basal metabolic rate is 4098 thus her basal metabolic rate is better than expected.  General: Cooperative, alert, well developed, in no acute distress. HEENT: Conjunctivae and lids unremarkable. Cardiovascular: Regular rhythm.  Lungs: Normal work of breathing. Neurologic: No  focal deficits.   Lab Results  Component Value Date   CREATININE 0.69 04/16/2023   BUN 14 04/16/2023   NA 139 04/16/2023   K 4.2 04/16/2023   CL 102 04/16/2023   CO2 24 04/16/2023   Lab Results  Component Value Date   ALT 18 04/16/2023   AST 22 04/16/2023   ALKPHOS 74 04/16/2023   BILITOT 0.4 04/16/2023   Lab Results  Component Value Date   HGBA1C 5.7 (H) 04/16/2023   Lab Results  Component Value Date   INSULIN 9.7 04/16/2023   Lab Results  Component Value Date   TSH 1.680 04/16/2023   Lab Results  Component Value Date   CHOL 207 (H) 04/16/2023   HDL 71 04/16/2023   LDLCALC 120 (H) 04/16/2023   TRIG 88 04/16/2023   Lab Results  Component Value Date   WBC 5.2 04/16/2023   HGB 12.9 04/16/2023   HCT 38.5 04/16/2023   MCV 93 04/16/2023   PLT 278 04/16/2023   No results found for: "IRON", "TIBC", "FERRITIN"  Attestation Statements:   Reviewed by clinician on day of visit: allergies, medications, problem list, medical history, surgical history, family history, social history, and previous encounter notes.   Trude Mcburney, am acting as Energy manager for [employer], DO.  I have reviewed the above documentation for accuracy and completeness, and I agree with the above. Corinna Capra, DO

## 2023-04-23 DIAGNOSIS — Z780 Asymptomatic menopausal state: Secondary | ICD-10-CM | POA: Diagnosis not present

## 2023-04-23 DIAGNOSIS — M85852 Other specified disorders of bone density and structure, left thigh: Secondary | ICD-10-CM | POA: Diagnosis not present

## 2023-04-25 ENCOUNTER — Other Ambulatory Visit (HOSPITAL_COMMUNITY): Payer: Self-pay

## 2023-04-25 ENCOUNTER — Other Ambulatory Visit: Payer: Self-pay

## 2023-04-25 DIAGNOSIS — E7800 Pure hypercholesterolemia, unspecified: Secondary | ICD-10-CM | POA: Diagnosis not present

## 2023-04-25 DIAGNOSIS — Z0000 Encounter for general adult medical examination without abnormal findings: Secondary | ICD-10-CM | POA: Diagnosis not present

## 2023-04-25 DIAGNOSIS — K219 Gastro-esophageal reflux disease without esophagitis: Secondary | ICD-10-CM | POA: Diagnosis not present

## 2023-04-25 DIAGNOSIS — E669 Obesity, unspecified: Secondary | ICD-10-CM | POA: Diagnosis not present

## 2023-04-25 DIAGNOSIS — M8580 Other specified disorders of bone density and structure, unspecified site: Secondary | ICD-10-CM | POA: Diagnosis not present

## 2023-04-25 DIAGNOSIS — J3089 Other allergic rhinitis: Secondary | ICD-10-CM | POA: Diagnosis not present

## 2023-04-25 DIAGNOSIS — R7303 Prediabetes: Secondary | ICD-10-CM | POA: Diagnosis not present

## 2023-04-25 DIAGNOSIS — R6889 Other general symptoms and signs: Secondary | ICD-10-CM | POA: Diagnosis not present

## 2023-04-25 DIAGNOSIS — E559 Vitamin D deficiency, unspecified: Secondary | ICD-10-CM | POA: Diagnosis not present

## 2023-04-25 DIAGNOSIS — M25512 Pain in left shoulder: Secondary | ICD-10-CM | POA: Diagnosis not present

## 2023-04-25 MED ORDER — VENLAFAXINE HCL ER 37.5 MG PO CP24
37.5000 mg | ORAL_CAPSULE | Freq: Every day | ORAL | 1 refills | Status: DC
  Filled 2023-04-25 – 2023-05-30 (×3): qty 90, 90d supply, fill #0
  Filled 2023-08-31: qty 90, 90d supply, fill #1

## 2023-04-25 MED ORDER — OMEPRAZOLE 20 MG PO CPDR
20.0000 mg | DELAYED_RELEASE_CAPSULE | Freq: Every day | ORAL | 3 refills | Status: DC
  Filled 2023-04-25 – 2023-06-15 (×2): qty 90, 90d supply, fill #0
  Filled 2023-08-31 – 2023-09-02 (×2): qty 90, 90d supply, fill #1
  Filled 2023-12-07: qty 90, 90d supply, fill #2
  Filled 2024-02-26: qty 90, 90d supply, fill #3

## 2023-04-25 MED ORDER — FLUTICASONE PROPIONATE 50 MCG/ACT NA SUSP
2.0000 | Freq: Every day | NASAL | 3 refills | Status: DC
  Filled 2023-04-25: qty 48, 90d supply, fill #0
  Filled 2023-08-31: qty 48, 90d supply, fill #1

## 2023-04-26 DIAGNOSIS — Z6831 Body mass index (BMI) 31.0-31.9, adult: Secondary | ICD-10-CM | POA: Diagnosis not present

## 2023-04-26 DIAGNOSIS — N952 Postmenopausal atrophic vaginitis: Secondary | ICD-10-CM | POA: Diagnosis not present

## 2023-04-26 DIAGNOSIS — M8580 Other specified disorders of bone density and structure, unspecified site: Secondary | ICD-10-CM | POA: Diagnosis not present

## 2023-04-26 DIAGNOSIS — Z01419 Encounter for gynecological examination (general) (routine) without abnormal findings: Secondary | ICD-10-CM | POA: Diagnosis not present

## 2023-04-30 ENCOUNTER — Encounter: Payer: Self-pay | Admitting: Nurse Practitioner

## 2023-04-30 ENCOUNTER — Ambulatory Visit: Payer: Commercial Managed Care - PPO | Admitting: Nurse Practitioner

## 2023-04-30 VITALS — BP 107/71 | HR 86 | Temp 97.8°F | Ht 65.5 in | Wt 183.0 lb

## 2023-04-30 DIAGNOSIS — E669 Obesity, unspecified: Secondary | ICD-10-CM

## 2023-04-30 DIAGNOSIS — Z6829 Body mass index (BMI) 29.0-29.9, adult: Secondary | ICD-10-CM

## 2023-04-30 DIAGNOSIS — E7849 Other hyperlipidemia: Secondary | ICD-10-CM | POA: Diagnosis not present

## 2023-04-30 DIAGNOSIS — R7303 Prediabetes: Secondary | ICD-10-CM

## 2023-04-30 NOTE — Progress Notes (Signed)
Office: [phone]  /  Fax: [phone]  WEIGHT SUMMARY AND BIOMETRICS  Weight Lost Since Last Visit: 5lb  No data recorded  Vitals Temp: 97.8 F (36.6 C) BP: 107/71 Pulse Rate: 86 SpO2: 98 %   Anthropometric Measurements Height: 5' 5.5" (1.664 m) Weight: 183 lb (83 kg) BMI (Calculated): 29.98 Weight at Last Visit: 188lb Weight Lost Since Last Visit: 5lb Starting Weight: 188lb Total Weight Loss (lbs): 5 lb (2.268 kg)   Body Composition  Body Fat %: 41.3 % Fat Mass (lbs): 76 lbs Muscle Mass (lbs): 102.4 lbs Total Body Water (lbs): 71.6 lbs Visceral Fat Rating : 10   Other Clinical Data Fasting: Yes Labs: No Today's Visit #: 2 Starting Date: 04/16/23     HPI  Chief Complaint: OBESITY  Danielle Braun is here to discuss her progress with her obesity treatment plan. She is on the the Category 2 Plan and states she is following her eating plan approximately 90 % of the time. She states she is exercising 40 minutes 4 days per week.   Interval History:  Since last office visit she has lost 5 pounds. She has a new grandchild-boy.  Everyone is doing well.  She is going to [facility] for 2 weeks to see him.  She is not skipping meals.  She is eating a protein with each meal.  She has had to work on increasing protein intake since she has not been used to eating "the volume of meat".  Some hunger and cravings noted.    BF:  1 egg, deli meat, cheese, peppers, onions and toast or protein cereal  Snack:  1/2 yogurt and berries Lunch:  sandwich with deli meat and 1/2 berries and 1/2 apples Snack:  dark chocolate and coffee with half and half and cottage cheese with or without fruit Dinner:  protein and vegetables Snack:  popcorn Drinks: water, coffee, diet sodas (mini), hot tea with stevia, sparkling water  Pharmacotherapy for weight loss: She is not currently taking medications  for medical weight loss.    Previous pharmacotherapy for medical weight loss:   None  Bariatric surgery:  Patient has not had bariatric surgery  Hyperlipidemia Medication(s): none. Denies side effects.   FH:  mother  Lab Results  Component Value Date   CHOL 207 (H) 04/16/2023   HDL 71 04/16/2023   LDLCALC 120 (H) 04/16/2023   TRIG 88 04/16/2023   Lab Results  Component Value Date   ALT 18 04/16/2023   AST 22 04/16/2023   ALKPHOS 74 04/16/2023   BILITOT 0.4 04/16/2023   The 10-year ASCVD risk score (Arnett DK, et al., 2019) is: 2%   Values used to calculate the score:     Age: 64 years     Sex: Female     Is Non-Hispanic African American: No     Diabetic: No     Tobacco smoker: No     Systolic Blood Pressure: 107 mmHg     Is BP treated: No     HDL Cholesterol: 71 mg/dL     Total Cholesterol: 207 mg/dL    Prediabetes Last G9F was 5.7  Medication(s): never been on meds Polyphagia:Yes FH:  bother type 1 Pat aunt DMT2 Pat gm DMT2  Lab Results  Component Value Date   HGBA1C 5.7 (H) 04/16/2023   Lab Results  Component Value Date   INSULIN 9.7 04/16/2023    PHYSICAL EXAM:  Blood pressure 107/71, pulse 86, temperature 97.8 F (36.6 C), height 5' 5.5" (  1.664 m), weight 183 lb (83 kg), SpO2 98 %. Body mass index is 29.99 kg/m.  General: She is overweight, cooperative, alert, well developed, and in no acute distress. PSYCH: Has normal mood, affect and thought process.   Extremities: No edema.  Neurologic: No gross sensory or motor deficits. No tremors or fasciculations noted.    DIAGNOSTIC DATA REVIEWED:  BMET    Component Value Date/Time   NA 139 04/16/2023 0856   K 4.2 04/16/2023 0856   CL 102 04/16/2023 0856   CO2 24 04/16/2023 0856   GLUCOSE 91 04/16/2023 0856   GLUCOSE 90 10/17/2017 0920   BUN 14 04/16/2023 0856   CREATININE 0.69 04/16/2023 0856   CALCIUM 9.2 04/16/2023 0856   GFRNONAA >60 10/17/2017 0920   GFRAA >60 10/17/2017 0920   Lab Results  Component Value Date   HGBA1C 5.7 (H) 04/16/2023   Lab Results   Component Value Date   INSULIN 9.7 04/16/2023   Lab Results  Component Value Date   TSH 1.680 04/16/2023   CBC    Component Value Date/Time   WBC 5.2 04/16/2023 0856   WBC 4.3 10/17/2017 0920   RBC 4.14 04/16/2023 0856   RBC 4.26 10/17/2017 0920   HGB 12.9 04/16/2023 0856   HCT 38.5 04/16/2023 0856   PLT 278 04/16/2023 0856   MCV 93 04/16/2023 0856   MCH 31.2 04/16/2023 0856   MCH 31.7 10/17/2017 0920   MCHC 33.5 04/16/2023 0856   MCHC 33.7 10/17/2017 0920   RDW 12.3 04/16/2023 0856   Iron Studies No results found for: "IRON", "TIBC", "FERRITIN", "IRONPCTSAT" Lipid Panel     Component Value Date/Time   CHOL 207 (H) 04/16/2023 0856   TRIG 88 04/16/2023 0856   HDL 71 04/16/2023 0856   LDLCALC 120 (H) 04/16/2023 0856   Hepatic Function Panel     Component Value Date/Time   PROT 6.7 04/16/2023 0856   ALBUMIN 4.2 04/16/2023 0856   AST 22 04/16/2023 0856   ALT 18 04/16/2023 0856   ALKPHOS 74 04/16/2023 0856   BILITOT 0.4 04/16/2023 0856      Component Value Date/Time   TSH 1.680 04/16/2023 0856   Nutritional Lab Results  Component Value Date   VD25OH 51.7 04/16/2023     ASSESSMENT AND PLAN  TREATMENT PLAN FOR OBESITY:  Recommended Dietary Goals  Justine is currently in the action stage of change. As such, her goal is to continue weight management plan. She has agreed to the Category 2 Plan.  Behavioral Intervention  We discussed the following Behavioral Modification Strategies today: increasing lean protein intake, decreasing simple carbohydrates , increasing vegetables, increasing lower glycemic fruits, increasing fiber rich foods, increasing water intake, continue to practice mindfulness when eating, and planning for success.  Additional resources provided today: NA  Recommended Physical Activity Goals  Nazli has been advised to work up to 150 minutes of moderate intensity aerobic activity a week and strengthening exercises 2-3 times per week  for cardiovascular health, weight loss maintenance and preservation of muscle mass.   She has agreed to Continue current level of physical activity    ASSOCIATED CONDITIONS ADDRESSED TODAY  Action/Plan  Prediabetes Idalina will continue to work on weight loss, exercise, and decreasing simple carbohydrates to help decrease the risk of diabetes.    Consider Metformin in the future if she continues to struggle with polyphagia and cravings.   Other hyperlipidemia Cardiovascular risk and specific lipid/LDL goals reviewed.  We discussed several lifestyle modifications today  and Hyun will continue to work on diet, exercise and weight loss efforts. Orders and follow up as documented in patient record.   Counseling Intensive lifestyle modifications are the first line treatment for this issue. Dietary changes: Increase soluble fiber. Decrease simple carbohydrates. Exercise changes: Moderate to vigorous-intensity aerobic activity 150 minutes per week if tolerated. Lipid-lowering medications: see documented in medical record.   ASCVD reviewed with patient today  Generalized obesity  BMI 29.0-29.9,adult       Labs reviewed in chart with patient from 04/16/23  Return in about 4 weeks (around 05/28/2023).Marland Kitchen She was informed of the importance of frequent follow up visits to maximize her success with intensive lifestyle modifications for her multiple health conditions.   ATTESTASTION STATEMENTS:  Reviewed by clinician on day of visit: allergies, medications, problem list, medical history, surgical history, family history, social history, and previous encounter notes.   Time spent on visit including pre-visit chart review and post-visit care and charting was 30 minutes.    Theodis Sato. Magnus Crescenzo FNP-C

## 2023-05-27 ENCOUNTER — Other Ambulatory Visit (HOSPITAL_COMMUNITY): Payer: Self-pay

## 2023-05-28 ENCOUNTER — Encounter: Payer: Self-pay | Admitting: Nurse Practitioner

## 2023-05-28 ENCOUNTER — Ambulatory Visit: Payer: Commercial Managed Care - PPO | Admitting: Nurse Practitioner

## 2023-05-28 VITALS — BP 115/75 | HR 85 | Temp 97.7°F | Ht 66.0 in | Wt 182.0 lb

## 2023-05-28 DIAGNOSIS — R7303 Prediabetes: Secondary | ICD-10-CM

## 2023-05-28 DIAGNOSIS — E669 Obesity, unspecified: Secondary | ICD-10-CM | POA: Diagnosis not present

## 2023-05-28 DIAGNOSIS — Z6829 Body mass index (BMI) 29.0-29.9, adult: Secondary | ICD-10-CM | POA: Diagnosis not present

## 2023-05-28 NOTE — Progress Notes (Signed)
Office: [phone]  /  Fax: [phone]  WEIGHT SUMMARY AND BIOMETRICS  Weight Lost Since Last Visit: 1lb  Weight Gained Since Last Visit: 0lb   Vitals Temp: 97.7 F (36.5 C) BP: 115/75 Pulse Rate: 85 SpO2: 99 %   Anthropometric Measurements Height: 5\' 6"  (1.676 m) Weight: 182 lb (82.6 kg) BMI (Calculated): 29.39 Weight at Last Visit: 183lb Weight Lost Since Last Visit: 1lb Weight Gained Since Last Visit: 0lb Starting Weight: 188lb Total Weight Loss (lbs): 6 lb (2.722 kg)   Body Composition  Body Fat %: 40.8 % Fat Mass (lbs): 74.6 lbs Muscle Mass (lbs): 102.8 lbs Total Body Water (lbs): 71.4 lbs Visceral Fat Rating : 10   Other Clinical Data Fasting: Yes Labs: No Today's Visit #: 3 Starting Date: 04/16/23     HPI  Chief Complaint: OBESITY  Danielle Braun is here to discuss her progress with her obesity treatment plan. She is on the the Category 2 Plan and states she is following her eating plan approximately 60 % of the time. She states she is exercising 40 minutes 4 days per week-cycling, rowing, walking.   Interval History:  Since last office visit she has lost 1 pound.  She has been on vacation since her last visit.  She does well with breakfast and lunch. If she gets off track, it's usually dinner.  She has done better following the plan since she has gotten back home.  She is not skipping meals.  She is focusing on eating more protein.  She is drinking water daily, occasionally drinks a mini soda.    Pharmacotherapy for weight loss: She is not currently taking medications  for medical weight loss.   Previous pharmacotherapy for medical weight loss:  none  Bariatric surgery:  Has not had bariatric surgery  Prediabetes Last A1c was 5.7  Medication(s): never been on meds-have discussed Metformin in the past Polyphagia:Yes FH:  bother type 1 Pat aunt DMT2 Pat gm DMT2   PHYSICAL EXAM:  Blood pressure 115/75, pulse 85, temperature 97.7 F (36.5  C), height 5\' 6"  (1.676 m), weight 182 lb (82.6 kg), SpO2 99%. Body mass index is 29.38 kg/m.  General: She is overweight, cooperative, alert, well developed, and in no acute distress. PSYCH: Has normal mood, affect and thought process.   Extremities: No edema.  Neurologic: No gross sensory or motor deficits. No tremors or fasciculations noted.    DIAGNOSTIC DATA REVIEWED:  BMET    Component Value Date/Time   NA 139 04/16/2023 0856   K 4.2 04/16/2023 0856   CL 102 04/16/2023 0856   CO2 24 04/16/2023 0856   GLUCOSE 91 04/16/2023 0856   GLUCOSE 90 10/17/2017 0920   BUN 14 04/16/2023 0856   CREATININE 0.69 04/16/2023 0856   CALCIUM 9.2 04/16/2023 0856   GFRNONAA >60 10/17/2017 0920   GFRAA >60 10/17/2017 0920   Lab Results  Component Value Date   HGBA1C 5.7 (H) 04/16/2023   Lab Results  Component Value Date   INSULIN 9.7 04/16/2023   Lab Results  Component Value Date   TSH 1.680 04/16/2023   CBC    Component Value Date/Time   WBC 5.2 04/16/2023 0856   WBC 4.3 10/17/2017 0920   RBC 4.14 04/16/2023 0856   RBC 4.26 10/17/2017 0920   HGB 12.9 04/16/2023 0856   HCT 38.5 04/16/2023 0856   PLT 278 04/16/2023 0856   MCV 93 04/16/2023 0856   MCH 31.2 04/16/2023 0856   MCH 31.7 10/17/2017 0920  MCHC 33.5 04/16/2023 0856   MCHC 33.7 10/17/2017 0920   RDW 12.3 04/16/2023 0856   Iron Studies No results found for: "IRON", "TIBC", "FERRITIN", "IRONPCTSAT" Lipid Panel     Component Value Date/Time   CHOL 207 (H) 04/16/2023 0856   TRIG 88 04/16/2023 0856   HDL 71 04/16/2023 0856   LDLCALC 120 (H) 04/16/2023 0856   Hepatic Function Panel     Component Value Date/Time   PROT 6.7 04/16/2023 0856   ALBUMIN 4.2 04/16/2023 0856   AST 22 04/16/2023 0856   ALT 18 04/16/2023 0856   ALKPHOS 74 04/16/2023 0856   BILITOT 0.4 04/16/2023 0856      Component Value Date/Time   TSH 1.680 04/16/2023 0856   Nutritional Lab Results  Component Value Date   VD25OH 51.7  04/16/2023     ASSESSMENT AND PLAN  TREATMENT PLAN FOR OBESITY:  Recommended Dietary Goals  Danielle Braun is currently in the action stage of change. As such, her goal is to continue weight management plan. She has agreed to the Category 2 Plan.  Behavioral Intervention  We discussed the following Behavioral Modification Strategies today: increasing lean protein intake, decreasing simple carbohydrates , increasing vegetables, increasing lower glycemic fruits, increasing fiber rich foods, avoiding skipping meals, increasing water intake, work on meal planning and preparation, continue to practice mindfulness when eating, and planning for success.  Additional resources provided today: NA  Recommended Physical Activity Goals  Danielle Braun has been advised to work up to 150 minutes of moderate intensity aerobic activity a week and strengthening exercises 2-3 times per week for cardiovascular health, weight loss maintenance and preservation of muscle mass.   She has agreed to Continue current level of physical activity     ASSOCIATED CONDITIONS ADDRESSED TODAY  Action/Plan  Prediabetes Danielle Braun will continue to work on weight loss, exercise, and decreasing simple carbohydrates to help decrease the risk of diabetes.   Can consider Metformin in the future. Will continue to monitor.     Generalized obesity  BMI 29.0-29.9,adult         Return in about 3 weeks (around 06/18/2023).Marland Kitchen She was informed of the importance of frequent follow up visits to maximize her success with intensive lifestyle modifications for her multiple health conditions.   ATTESTASTION STATEMENTS:  Reviewed by clinician on day of visit: allergies, medications, problem list, medical history, surgical history, family history, social history, and previous encounter notes.   Time spent on visit including pre-visit chart review and post-visit care and charting was 30 minutes.    Theodis Sato. Demont Linford FNP-C

## 2023-05-30 ENCOUNTER — Other Ambulatory Visit (HOSPITAL_COMMUNITY): Payer: Self-pay

## 2023-06-04 ENCOUNTER — Other Ambulatory Visit (HOSPITAL_COMMUNITY): Payer: Self-pay

## 2023-06-09 ENCOUNTER — Ambulatory Visit
Admission: RE | Admit: 2023-06-09 | Discharge: 2023-06-09 | Disposition: A | Discharge status: Home / Self Care | Payer: Commercial Managed Care - PPO | Source: Ambulatory Visit | Attending: Family Medicine | Admitting: Family Medicine

## 2023-06-09 ENCOUNTER — Other Ambulatory Visit: Payer: Self-pay

## 2023-06-09 ENCOUNTER — Ambulatory Visit (INDEPENDENT_AMBULATORY_CARE_PROVIDER_SITE_OTHER): Payer: Commercial Managed Care - PPO

## 2023-06-09 VITALS — BP 124/82 | HR 67 | Temp 98.4°F | Resp 17 | Ht 65.0 in | Wt 180.0 lb

## 2023-06-09 DIAGNOSIS — K5900 Constipation, unspecified: Secondary | ICD-10-CM | POA: Diagnosis not present

## 2023-06-09 DIAGNOSIS — R1030 Lower abdominal pain, unspecified: Secondary | ICD-10-CM | POA: Diagnosis not present

## 2023-06-09 DIAGNOSIS — M47816 Spondylosis without myelopathy or radiculopathy, lumbar region: Secondary | ICD-10-CM | POA: Diagnosis not present

## 2023-06-09 LAB — POCT URINALYSIS DIP (MANUAL ENTRY)
Bilirubin, UA: NEGATIVE
Blood, UA: NEGATIVE
Glucose, UA: NEGATIVE mg/dL
Ketones, POC UA: NEGATIVE mg/dL
Leukocytes, UA: NEGATIVE
Nitrite, UA: NEGATIVE
Protein Ur, POC: NEGATIVE mg/dL
Spec Grav, UA: 1.02 (ref 1.010–1.025)
Urobilinogen, UA: 0.2 E.U./dL
pH, UA: 6 (ref 5.0–8.0)

## 2023-06-09 MED ORDER — MAGNESIUM CITRATE PO SOLN: ORAL | 0 refills | Status: DC

## 2023-06-09 MED ORDER — CELECOXIB 200 MG PO CAPS: 200.0000 mg | ORAL_CAPSULE | Freq: Every day | ORAL | 0 refills | Status: AC

## 2023-06-09 NOTE — ED Provider Notes (Signed)
[facility]    CSN: [id number] Arrival date & time: 06/09/23  2536      History   Chief Complaint Chief Complaint  Patient presents with   Abdominal Pain    and lower back pain intermittently since Monday. - Entered by patient    HPI Danielle Braun is a 62 y.o. female.   HPI Very pleasant 62 year old female woman presents with lower back pain for 6 days and abdominal pain for 5 days.  PMH significant for prediabetes, obesity, HLD, and PAC.  Past Medical History:  Diagnosis Date   Acne    Arrhythmia    Bursitis, subacromial    Left   Endometriosis    GERD (gastroesophageal reflux disease)    Joint pain    Palpitations    Retinal tear    Sinusitis    SOB (shortness of breath)    Vitamin D deficiency     Patient Active Problem List   Diagnosis Date Noted   Prediabetes 04/17/2023   Personal history of colonic polyps 04/17/2023   Other fatigue 04/16/2023   SOB (shortness of breath) on exertion 04/16/2023   Health care maintenance 04/16/2023   Family history of diabetes mellitus 04/16/2023   Depression screening 04/16/2023   Generalized obesity 04/16/2023   BMI 30.0-30.9,adult 04/16/2023   Seasonal allergies 11/07/2022   Family history of osteoporosis 11/06/2022   Postmenopause 11/06/2022   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   Obesity (BMI 30-39.9) 01/26/2022   Patellofemoral pain syndrome of right knee 01/17/2022   Freiberg's disease, right 11/09/2021   Age-related osteoporosis without current pathological fracture 11/01/2021   Chronic sinusitis, unspecified 11/01/2021   Retinal tear of left eye 07/05/2021   History of laparoscopic-assisted vaginal hysterectomy 03/31/2021   History of sacrocolpopexy 03/31/2021   Blepharitis of upper and lower eyelids of both eyes 08/23/2020   Dry eye syndrome of both lacrimal glands 08/23/2020   Nuclear sclerotic cataract of both eyes 08/23/2020   Primary refluxing megaureter 03/02/2020   Sensation of  feeling hot 01/18/2020   Chronic left shoulder pain 11/11/2018   Vitamin D insufficiency 11/11/2018   Low bone mass 09/26/2017   Immune to measles 02/20/2016   Allergic rhinitis 02/06/2016   GERD (gastroesophageal reflux disease) 02/06/2016   Hyperlipemia 02/06/2016   Keratoconjunctivitis sicca of both eyes not specified as Sjogren's 02/06/2016   Myopia of both eyes with astigmatism 02/06/2016   Posterior vitreous detachment of left eye 02/06/2016   Premature atrial contraction 02/06/2016   History of right salpingo-oophorectomy 03/14/2010    Past Surgical History:  Procedure Laterality Date   ABDOMINAL HYSTERECTOMY     ABDOMINAL SURGERY     csection   DILATION AND CURETTAGE OF UTERUS     SHOULDER ARTHROSCOPY WITH SUBACROMIAL DECOMPRESSION Left 10/31/2017   Procedure: SHOULDER ARTHROSCOPY WITH BURSECTOMY AND SUBACROMIAL DECOMPRESSION;  Surgeon: Jones Broom, MD;  Location: [facility] OR;  Service: Orthopedics;  Laterality: Left;  Left shoulder arthroscopy bursectomy and subacromial decompression   TONSILLECTOMY      OB History     Gravida  2   Para      Term      Preterm      AB      Living         SAB      IAB      Ectopic      Multiple      Live Births  Home Medications    Prior to Admission medications   Medication Sig Start Date End Date Taking? Authorizing Provider  acetaminophen (TYLENOL) 500 MG tablet Take 1,000 mg by mouth every 6 (six) hours as needed for moderate pain or headache.   Yes [provider]  Calcium Citrate-Vitamin D 315-250 MG-UNIT TABS calcium citrate 315 mg calcium-vitamin D3 6.25 mcg (250 unit) tablet 09/27/17  Yes [provider]  celecoxib (CELEBREX) 200 MG capsule Take 1 capsule (200 mg total) by mouth daily for 15 days. 06/09/23 06/24/23 Yes Trevor Iha, FNP  Cholecalciferol (VITAMIN D3) 2000 units TABS Take 2,000 Units by mouth daily.   Yes [provider]  cyclobenzaprine (FLEXERIL) 5  MG tablet Take 1 tablet (5 mg total) by mouth at bedtime as needed for muscle spasms. 08/24/22  Yes Lattie Haw, MD  diphenhydrAMINE (BENADRYL) 25 mg capsule Take 25 mg by mouth at bedtime as needed.   Yes [provider]  estradiol (ESTRACE) 0.1 MG/GM vaginal cream Place 1 Applicatorful vaginally at bedtime.   Yes [provider]  fexofenadine (ALLEGRA) 60 MG tablet Take 60 mg by mouth daily as needed.    Yes [provider]  fluticasone (FLONASE) 50 MCG/ACT nasal spray USE 2 SPRAYS IN EACH NOSTRIL ONCE A DAY 01/26/22  Yes   fluticasone (FLONASE) 50 MCG/ACT nasal spray Place 2 sprays into both nostrils daily. 04/25/23  Yes   hydrocortisone 2.5 % ointment Apply 1 Application topically 2 (two) times daily to affected itchy area on chest as needed with flares 02/07/23  Yes   ipratropium (ATROVENT) 0.03 % nasal spray Place 2 sprays into both nostrils every 12 (twelve) hours. 12/15/21  Yes Roxy Horseman, PA-C  magnesium citrate SOLN Take 150 mL PO twice daily for 3 days 06/09/23  Yes Trevor Iha, FNP  Methylcellulose, Laxative, 500 MG TABS Take 1 tablet by mouth as needed. 01/01/20  Yes [provider]  Minoxidil (ROGAINE MENS EXTRA STRENGTH) 5 % FOAM    Yes [provider]  naproxen sodium (ALEVE) 220 MG tablet Take by mouth. 11/12/18  Yes [provider]  omeprazole (PRILOSEC) 20 MG capsule Take 1 capsule (20 mg total) by mouth daily. 04/25/23  Yes   traMADol (ULTRAM) 50 MG tablet Take 50 mg by mouth 2 (two) times daily as needed for moderate pain.   Yes [provider]  venlafaxine XR (EFFEXOR-XR) 37.5 MG 24 hr capsule Take 1 capsule (37.5 mg total) by mouth daily. 04/25/23  Yes   RSV vaccine recomb adjuvanted (AREXVY) 120 MCG/0.5ML injection Inject into the muscle. Patient not taking: Reported on 04/30/2023 12/25/22       Family History Family History  Problem Relation Age of Onset   Hyperlipidemia Mother    High blood pressure  Mother    Depression Mother    Anxiety disorder Mother    Cancer Father        bladder   High blood pressure Father    High Cholesterol Father    Depression Father    Anxiety disorder Father    Diabetes Brother    Breast cancer Neg Hx     Social History Social History   Tobacco Use   Smoking status: Former    Types: Cigarettes   Smokeless tobacco: Never  Vaping Use   Vaping status: Never Used  Substance Use Topics   Alcohol use: Yes    Comment: occasional   Drug use: No     Allergies   Influenza  virus vaccine   Review of Systems Review of Systems  Gastrointestinal:  Positive for abdominal pain.  Musculoskeletal:  Positive for back pain.     Physical Exam Triage Vital Signs ED Triage Vitals  Encounter Vitals Group     BP 06/09/23 0941 124/82     Systolic BP Percentile --      Diastolic BP Percentile --      Pulse Rate 06/09/23 0941 67     Resp 06/09/23 0941 17     Temp 06/09/23 0941 98.4 F (36.9 C)     Temp Source 06/09/23 0941 Oral     SpO2 06/09/23 0941 99 %     Weight 06/09/23 0939 180 lb (81.6 kg)     Height 06/09/23 0939 5\' 5"  (1.651 m)     Head Circumference --      Peak Flow --      Pain Score 06/09/23 0939 4     Pain Loc --      Pain Education --      Exclude from Growth Chart --    No data found.  Updated Vital Signs BP 124/82 (BP Location: Right Arm)   Pulse 67   Temp 98.4 F (36.9 C) (Oral)   Resp 17   Ht 5\' 5"  (1.651 m)   Wt 180 lb (81.6 kg)   SpO2 99%   BMI 29.95 kg/m    Physical Exam Vitals and nursing note reviewed.  Constitutional:      Appearance: She is well-developed. She is obese. She is not ill-appearing.  HENT:     Head: Normocephalic and atraumatic.     Mouth/Throat:     Mouth: Mucous membranes are moist.     Pharynx: Oropharynx is clear.  Eyes:     Extraocular Movements: Extraocular movements intact.     Pupils: Pupils are equal, round, and reactive to light.  Cardiovascular:     Rate and Rhythm: Normal  rate and regular rhythm.     Heart sounds: Normal heart sounds. No murmur heard. Pulmonary:     Effort: Pulmonary effort is normal.     Breath sounds: Normal breath sounds. No wheezing, rhonchi or rales.  Abdominal:     General: Abdomen is flat and scaphoid. Bowel sounds are absent.     Palpations: Abdomen is soft.     Tenderness: There is abdominal tenderness in the right lower quadrant and left lower quadrant. There is no right CVA tenderness, left CVA tenderness, guarding or rebound. Negative signs include Murphy's sign and McBurney's sign.     Hernia: No hernia is present.  Skin:    General: Skin is warm and dry.  Neurological:     General: No focal deficit present.     Mental Status: She is alert and oriented to person, place, and time.  Psychiatric:        Mood and Affect: Mood normal.        Behavior: Behavior normal.      UC Treatments / Results  Labs (all labs ordered are listed, but only abnormal results are displayed) Labs Reviewed  POCT URINALYSIS DIP (MANUAL ENTRY) - Normal    EKG   Radiology DG Abdomen 1 View  Result Date: 06/09/2023 CLINICAL DATA:  Lower abdominal pain for 1 week. EXAM: ABDOMEN - 1 VIEW COMPARISON:  None Available. FINDINGS: The bowel gas pattern is normal. Moderate stool seen throughout the colon. No radio-opaque calculi or other significant radiographic abnormality are seen. IMPRESSION: No acute findings. Electronically  Signed   By: Danae Orleans M.D.   On: 06/09/2023 11:12    Procedures Procedures (including critical care time)  Medications Ordered in UC Medications - No data to display  Initial Impression / Assessment and Plan / UC Course  I have reviewed the triage vital signs and the nursing notes.  Pertinent labs & imaging results that were available during my care of the patient were reviewed by me and considered in my medical decision making (see chart for details).     MDM: 1.  Lower abdominal pain-KUB revealed above patient  advised and provided copy of results; 2.  Constipation, unspecified type-KUB revealed above, Rx'd mag citrate 150 mL p.o. twice daily x 3 days; 3. Lumbar spondylosis-as demonstrated on lumbar spine x-ray from 08/24/2022-Rx'd Celebrex 200 mg capsule daily x 15 days. Advised patient to take medication (Celebrex) as directed with food to completion.  Advised patient to take mag citrate as directed.  Encouraged increase daily water intake to 64 ounces per day 7 days/week.  Encouraged increase daily fiber intake to 60 to 80 g/day.  Advised if symptoms worsen and/or unresolved please follow-up with PCP or here for further evaluation.  Patient discharged home, hemodynamically stable.  Final Clinical Impressions(s) / UC Diagnoses   Final diagnoses:  Lower abdominal pain  Constipation, unspecified constipation type  Lumbar spondylosis     Discharge Instructions      Advised patient to take medication (Celebrex) as directed with food to completion.  Advised patient to take mag citrate as directed.  Encouraged increase daily water intake to 64 ounces per day 7 days/week.  Encouraged increase daily fiber intake to 60 to 80 g/day.  Advised if symptoms worsen and/or unresolved please follow-up with PCP or here for further evaluation.     ED Prescriptions     Medication Sig Dispense Auth. Provider   magnesium citrate SOLN Take 150 mL PO twice daily for 3 days 900 mL Trevor Iha, FNP   celecoxib (CELEBREX) 200 MG capsule Take 1 capsule (200 mg total) by mouth daily for 15 days. 15 capsule Trevor Iha, FNP      I have reviewed the PDMP during this encounter.   Trevor Iha, FNP 06/09/23 1153

## 2023-06-09 NOTE — ED Notes (Signed)
Pt taken to xray with xray tech.

## 2023-06-09 NOTE — Discharge Instructions (Addendum)
Advised patient to take medication (Celebrex) as directed with food to completion.  Advised patient to take mag citrate as directed.  Encouraged increase daily water intake to 64 ounces per day 7 days/week.  Encouraged increase daily fiber intake to 60 to 80 g/day.  Advised if symptoms worsen and/or unresolved please follow-up with PCP or here for further evaluation.

## 2023-06-09 NOTE — ED Triage Notes (Signed)
Pt states that she has some back pain x6 days Pt states that she also has some abdominal pain x5 days.

## 2023-06-14 ENCOUNTER — Ambulatory Visit
Admission: EM | Admit: 2023-06-14 | Discharge: 2023-06-14 | Disposition: A | Discharge status: Home / Self Care | Payer: Commercial Managed Care - PPO | Attending: Family Medicine | Admitting: Family Medicine

## 2023-06-14 ENCOUNTER — Ambulatory Visit (INDEPENDENT_AMBULATORY_CARE_PROVIDER_SITE_OTHER): Payer: Commercial Managed Care - PPO

## 2023-06-14 ENCOUNTER — Other Ambulatory Visit: Payer: Self-pay

## 2023-06-14 DIAGNOSIS — R1084 Generalized abdominal pain: Secondary | ICD-10-CM

## 2023-06-14 DIAGNOSIS — Z90710 Acquired absence of both cervix and uterus: Secondary | ICD-10-CM | POA: Diagnosis not present

## 2023-06-14 DIAGNOSIS — R109 Unspecified abdominal pain: Secondary | ICD-10-CM | POA: Diagnosis not present

## 2023-06-14 MED ORDER — IOHEXOL 300 MG/ML  SOLN
100.0000 mL | Freq: Once | INTRAMUSCULAR | Status: AC | PRN
  Administered 2023-06-14: 100 mL via INTRAVENOUS

## 2023-06-14 NOTE — ED Triage Notes (Signed)
Seen on Sunday for abd pain, xray showed constipation. Took mag citrate. Since Wednesday night now has had return of pain. LBM this AM. Passing gas normally.

## 2023-06-14 NOTE — ED Provider Notes (Signed)
[facility]    CSN: [id number] Arrival date & time: 06/14/23  0841      History   Chief Complaint No chief complaint on file.   HPI Danielle Braun is a 62 y.o. female.   Patient was evaluated here five days ago for abdominal pain, thought to be caused by constipation based upon a one-view abdominal x-ray that showed moderate stool throughout the colon.  Her symptoms resolved, including low back pain, after taking mag citrate for three days, increasing fluid intake and adding fiber supplement.  During the past two days her pain has recurred but not as severe.  She denies urinary symptoms, fever, and nausea/vomiting. She reports that she has changed her diet during the past two months to include more protein. Review of records reveals normal thyroid function (TSH, free T3, free T4) 04/16/23.  A CT abdomen pelvis 07/27/21 was normal.  The history is provided by the patient.    Past Medical History:  Diagnosis Date   Acne    Arrhythmia    Bursitis, subacromial    Left   Endometriosis    GERD (gastroesophageal reflux disease)    Joint pain    Palpitations    Retinal tear    Sinusitis    SOB (shortness of breath)    Vitamin D deficiency     Patient Active Problem List   Diagnosis Date Noted   Prediabetes 04/17/2023   Personal history of colonic polyps 04/17/2023   Other fatigue 04/16/2023   SOB (shortness of breath) on exertion 04/16/2023   Health care maintenance 04/16/2023   Family history of diabetes mellitus 04/16/2023   Depression screening 04/16/2023   Generalized obesity 04/16/2023   BMI 30.0-30.9,adult 04/16/2023   Seasonal allergies 11/07/2022   Family history of osteoporosis 11/06/2022   Postmenopause 11/06/2022   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   Obesity (BMI 30-39.9) 01/26/2022   Patellofemoral pain syndrome of right knee 01/17/2022   Freiberg's disease, right 11/09/2021   Age-related osteoporosis without current pathological  fracture 11/01/2021   Chronic sinusitis, unspecified 11/01/2021   Retinal tear of left eye 07/05/2021   History of laparoscopic-assisted vaginal hysterectomy 03/31/2021   History of sacrocolpopexy 03/31/2021   Blepharitis of upper and lower eyelids of both eyes 08/23/2020   Dry eye syndrome of both lacrimal glands 08/23/2020   Nuclear sclerotic cataract of both eyes 08/23/2020   Primary refluxing megaureter 03/02/2020   Sensation of feeling hot 01/18/2020   Chronic left shoulder pain 11/11/2018   Vitamin D insufficiency 11/11/2018   Low bone mass 09/26/2017   Immune to measles 02/20/2016   Allergic rhinitis 02/06/2016   GERD (gastroesophageal reflux disease) 02/06/2016   Hyperlipemia 02/06/2016   Keratoconjunctivitis sicca of both eyes not specified as Sjogren's 02/06/2016   Myopia of both eyes with astigmatism 02/06/2016   Posterior vitreous detachment of left eye 02/06/2016   Premature atrial contraction 02/06/2016   History of right salpingo-oophorectomy 03/14/2010    Past Surgical History:  Procedure Laterality Date   ABDOMINAL HYSTERECTOMY     ABDOMINAL SURGERY     csection   DILATION AND CURETTAGE OF UTERUS     SHOULDER ARTHROSCOPY WITH SUBACROMIAL DECOMPRESSION Left 10/31/2017   Procedure: SHOULDER ARTHROSCOPY WITH BURSECTOMY AND SUBACROMIAL DECOMPRESSION;  Surgeon: Jones Broom, MD;  Location: [facility] OR;  Service: Orthopedics;  Laterality: Left;  Left shoulder arthroscopy bursectomy and subacromial decompression   TONSILLECTOMY      OB History     Gravida  2  Para      Term      Preterm      AB      Living         SAB      IAB      Ectopic      Multiple      Live Births               Home Medications    Prior to Admission medications   Medication Sig Start Date End Date Taking? Authorizing Provider  acetaminophen (TYLENOL) 500 MG tablet Take 1,000 mg by mouth every 6 (six) hours as needed for moderate pain or headache.    [provider]  Calcium Citrate-Vitamin D 315-250 MG-UNIT TABS calcium citrate 315 mg calcium-vitamin D3 6.25 mcg (250 unit) tablet 09/27/17   [provider]  celecoxib (CELEBREX) 200 MG capsule Take 1 capsule (200 mg total) by mouth daily for 15 days. 06/09/23 06/24/23  Trevor Iha, FNP  Cholecalciferol (VITAMIN D3) 2000 units TABS Take 2,000 Units by mouth daily.    [provider]  cyclobenzaprine (FLEXERIL) 5 MG tablet Take 1 tablet (5 mg total) by mouth at bedtime as needed for muscle spasms. 08/24/22   Lattie Haw, MD  diphenhydrAMINE (BENADRYL) 25 mg capsule Take 25 mg by mouth at bedtime as needed.    [provider]  estradiol (ESTRACE) 0.1 MG/GM vaginal cream Place 1 Applicatorful vaginally at bedtime.    [provider]  fexofenadine (ALLEGRA) 60 MG tablet Take 60 mg by mouth daily as needed.     [provider]  fluticasone (FLONASE) 50 MCG/ACT nasal spray USE 2 SPRAYS IN EACH NOSTRIL ONCE A DAY 01/26/22     fluticasone (FLONASE) 50 MCG/ACT nasal spray Place 2 sprays into both nostrils daily. 04/25/23     hydrocortisone 2.5 % ointment Apply 1 Application topically 2 (two) times daily to affected itchy area on chest as needed with flares 02/07/23     ipratropium (ATROVENT) 0.03 % nasal spray Place 2 sprays into both nostrils every 12 (twelve) hours. 12/15/21   Roxy Horseman, PA-C  magnesium citrate SOLN Take 150 mL PO twice daily for 3 days 06/09/23   Trevor Iha, FNP  Methylcellulose, Laxative, 500 MG TABS Take 1 tablet by mouth as needed. 01/01/20   [provider]  Minoxidil (ROGAINE MENS EXTRA STRENGTH) 5 % FOAM     [provider]  naproxen sodium (ALEVE) 220 MG tablet Take by mouth. 11/12/18   [provider]  omeprazole (PRILOSEC) 20 MG capsule Take 1 capsule (20 mg total) by mouth daily. 04/25/23     RSV vaccine recomb adjuvanted (AREXVY) 120 MCG/0.5ML injection Inject into the muscle. Patient not taking:  Reported on 04/30/2023 12/25/22     traMADol (ULTRAM) 50 MG tablet Take 50 mg by mouth 2 (two) times daily as needed for moderate pain.    [provider]  venlafaxine XR (EFFEXOR-XR) 37.5 MG 24 hr capsule Take 1 capsule (37.5 mg total) by mouth daily. 04/25/23       Family History Family History  Problem Relation Age of Onset   Hyperlipidemia Mother    High blood pressure Mother    Depression Mother    Anxiety disorder Mother    Cancer Father        bladder   High blood pressure Father    High Cholesterol Father    Depression Father    Anxiety disorder Father  Diabetes Brother    Breast cancer Neg Hx     Social History Social History   Tobacco Use   Smoking status: Former    Types: Cigarettes   Smokeless tobacco: Never  Vaping Use   Vaping status: Never Used  Substance Use Topics   Alcohol use: Yes    Comment: occasional   Drug use: No     Allergies   Influenza virus vaccine   Review of Systems Review of Systems  Constitutional:  Negative for activity change, appetite change, chills, diaphoresis, fatigue and fever.  Gastrointestinal:  Positive for abdominal pain and constipation. Negative for abdominal distention, blood in stool, diarrhea, nausea and vomiting.  Genitourinary:  Negative for dysuria, flank pain, hematuria and pelvic pain.  All other systems reviewed and are negative.    Physical Exam Triage Vital Signs ED Triage Vitals [06/14/23 0929]  Encounter Vitals Group     BP (!) 142/87     Systolic BP Percentile      Diastolic BP Percentile      Pulse Rate 67     Resp 16     Temp 98 F (36.7 C)     Temp Source Oral     SpO2 100 %     Weight      Height      Head Circumference      Peak Flow      Pain Score 5     Pain Loc      Pain Education      Exclude from Growth Chart    No data found.  Updated Vital Signs BP (!) 142/87 (BP Location: Right Arm)   Pulse 67   Temp 98 F (36.7 C) (Oral)   Resp 16   SpO2 100%   Visual  Acuity Right Eye Distance:   Left Eye Distance:   Bilateral Distance:    Right Eye Near:   Left Eye Near:    Bilateral Near:     Physical Exam Vitals and nursing note reviewed.  Constitutional:      General: She is not in acute distress. HENT:     Head: Normocephalic.     Mouth/Throat:     Mouth: Mucous membranes are moist.     Pharynx: Oropharynx is clear.  Eyes:     Pupils: Pupils are equal, round, and reactive to light.  Cardiovascular:     Rate and Rhythm: Normal rate and regular rhythm.     Heart sounds: Normal heart sounds.  Pulmonary:     Breath sounds: Normal breath sounds.  Abdominal:     General: Bowel sounds are normal. There is no distension.     Palpations: Abdomen is soft.     Tenderness: There is no right CVA tenderness, left CVA tenderness or guarding. Negative signs include McBurney's sign, psoas sign and obturator sign.     Hernia: There is no hernia in the umbilical area or ventral area.       Comments: There is minimal tenderness left abdomen  Musculoskeletal:     Cervical back: Neck supple.  Lymphadenopathy:     Cervical: No cervical adenopathy.  Neurological:     Mental Status: She is alert.      UC Treatments / Results  Labs (all labs ordered are listed, but only abnormal results are displayed) Labs Reviewed - No data to display  EKG   Radiology CT ABDOMEN PELVIS W CONTRAST  Result Date: 06/14/2023 CLINICAL DATA:  Abdominal pain, acute, nonlocalized. Lower  abdominal pain and lower back pain. EXAM: CT ABDOMEN AND PELVIS WITH CONTRAST TECHNIQUE: Multidetector CT imaging of the abdomen and pelvis was performed using the standard protocol following bolus administration of intravenous contrast. RADIATION DOSE REDUCTION: This exam was performed according to the departmental dose-optimization program which includes automated exposure control, adjustment of the mA and/or kV according to patient size and/or use of iterative reconstruction technique.  CONTRAST:  OMNIPAQUE IOHEXOL 300 MG/ML  SOLN COMPARISON:  CT abdomen/pelvis 07/27/2021. FINDINGS: Lower chest: No acute abnormality. Hepatobiliary: No focal liver abnormality is seen. No gallstones, gallbladder wall thickening, or biliary dilatation. Pancreas: Unremarkable. No pancreatic ductal dilatation or surrounding inflammatory changes. Spleen: Normal. Adrenals/Urinary Tract: Adrenal glands are unremarkable. Kidneys are normal, without renal calculi, focal lesion, or hydronephrosis. Bladder is unremarkable. Stomach/Bowel: Normal stomach and duodenum. No dilated loops of small bowel. Normal appendix is visualized on axial image 68 series 2. Colon is unremarkable. No bowel wall thickening or surrounding inflammation. Vascular/Lymphatic: Aortic atherosclerosis. No enlarged abdominal or pelvic lymph nodes. Reproductive: Status post hysterectomy. No adnexal masses. Other: No abdominal wall hernia or abnormality. No abdominopelvic ascites. Musculoskeletal: No acute bone findings. IMPRESSION: No acute abdominopelvic abnormality. Aortic Atherosclerosis (ICD10-I70.0). Electronically Signed   By: Orvan Falconer M.D.   On: 06/14/2023 12:07    Procedures Procedures (including critical care time)  Medications Ordered in UC Medications - No data to display  Initial Impression / Assessment and Plan / UC Course  I have reviewed the triage vital signs and the nursing notes.  Pertinent labs & imaging results that were available during my care of the patient were reviewed by me and considered in my medical decision making (see chart for details).    Negative CT abd/pelvis reassuring. Begin Miralax. Followup with GI if symptoms persist.  Final Clinical Impressions(s) / UC Diagnoses   Final diagnoses:  Generalized abdominal pain     Discharge Instructions      Continue increased fiber in diet.  Try Miralax one capful in 8 ounces fluid daily.  May gradually adjust daily dosage (1/4 to 1/2 capful  daily in fluid).  If symptoms become significantly worse during the night or over the weekend, proceed to the local emergency room.     ED Prescriptions   None       Lattie Haw, MD 06/16/23 1022

## 2023-06-14 NOTE — Discharge Instructions (Signed)
Continue increased fiber in diet.  Try Miralax one capful in 8 ounces fluid daily.  May gradually adjust daily dosage (1/4 to 1/2 capful daily in fluid).  If symptoms become significantly worse during the night or over the weekend, proceed to the local emergency room.

## 2023-06-16 ENCOUNTER — Other Ambulatory Visit (HOSPITAL_COMMUNITY): Payer: Self-pay

## 2023-06-17 ENCOUNTER — Other Ambulatory Visit: Payer: Self-pay

## 2023-06-24 ENCOUNTER — Encounter: Payer: Self-pay | Admitting: Bariatrics

## 2023-06-24 ENCOUNTER — Ambulatory Visit: Payer: Commercial Managed Care - PPO | Admitting: Bariatrics

## 2023-06-24 VITALS — BP 112/70 | HR 76 | Temp 97.9°F | Ht 66.0 in | Wt 182.0 lb

## 2023-06-24 DIAGNOSIS — E669 Obesity, unspecified: Secondary | ICD-10-CM

## 2023-06-24 DIAGNOSIS — Z6829 Body mass index (BMI) 29.0-29.9, adult: Secondary | ICD-10-CM

## 2023-06-24 DIAGNOSIS — R7303 Prediabetes: Secondary | ICD-10-CM

## 2023-06-24 NOTE — Progress Notes (Unsigned)
   WEIGHT SUMMARY AND BIOMETRICS  Weight Lost Since Last Visit: 0  Weight Gained Since Last Visit: 0   Vitals Temp: 97.9 F (36.6 C) BP: 112/70 Pulse Rate: 76 SpO2: 92 %   Anthropometric Measurements Height: 5\' 6"  (1.676 m) Weight: 182 lb (82.6 kg) BMI (Calculated): 29.39 Weight at Last Visit: 182lb Weight Lost Since Last Visit: 0 Weight Gained Since Last Visit: 0 Starting Weight: 188lb Total Weight Loss (lbs): 6 lb (2.722 kg)   Body Composition  Body Fat %: 40.5 % Fat Mass (lbs): 73.8 lbs Muscle Mass (lbs): 103 lbs Total Body Water (lbs): 72.2 lbs Visceral Fat Rating : 10   Other Clinical Data Fasting: no Labs: no Today's Visit #: 4 Starting Date: 04/16/23    OBESITY Danielle Braun is here to discuss her progress with her obesity treatment plan along with follow-up of her obesity related diagnoses.     Nutrition Plan: the Category 2 plan - 65% adherence.  Current exercise:  strength training, cycling/rowing.  Interim History:  Her weight remains the same. She has had some abdominal pain, which has effected her food choices. She did have a work-up and imaging, and is better at this time.  Eating all of the food on the plan., Protein intake is less than prescribed., Water intake is adequate., and Denies excessive cravings.  Hunger is moderately controlled.  Cravings are moderately controlled.  Assessment/Plan:  Prediabetes Last A1c was 5.7  Medication(s): none  Lab Results  Component Value Date   HGBA1C 5.7 (H) 04/16/2023   Lab Results  Component Value Date   INSULIN 9.7 04/16/2023    Plan: Will minimize all refined carbohydrates both sweets and starches.  Will work on the plan and exercise.  Consider both aerobic and resistance training.  Will keep protein, water, and fiber intake high.  Aim for 7 to 9 hours of sleep nightly.  Will begin journaling with an emphasis on protein and calories.   Generalized Obesity: Current BMI BMI  (Calculated): 29.39    Danielle Braun is currently in the action stage of change. As such, her goal is to continue with weight loss efforts.  She has agreed to Category 2 and journaling 1,200 calories and 80 grams of protein.    Exercise goals: All adults should avoid inactivity. Some physical activity is better than none, and adults who participate in any amount of physical activity gain some health benefits.  Behavioral modification strategies: increasing lean protein intake, decreasing simple carbohydrates , no meal skipping, increase water intake, planning for success, increasing fiber rich foods, and mindful eating.  Danielle Braun has agreed to follow-up with our clinic in 3 weeks ( fasting labs ).       Objective:   VITALS: Per patient if applicable, see vitals. GENERAL: Alert and in no acute distress. CARDIOPULMONARY: No increased WOB. Speaking in clear sentences.  PSYCH: Pleasant and cooperative. Speech normal rate and rhythm. Affect is appropriate. Insight and judgement are appropriate. Attention is focused, linear, and appropriate.  NEURO: Oriented as arrived to appointment on time with no prompting.   Attestation Statements:   This was prepared with the assistance of Engineer, civil (consulting).  Occasional wrong-word or sound-a-like substitutions may have occurred due to the inherent limitations of voice recognition software.   Corinna Capra, DO

## 2023-07-17 ENCOUNTER — Encounter: Payer: Self-pay | Admitting: Bariatrics

## 2023-07-17 ENCOUNTER — Ambulatory Visit: Payer: Commercial Managed Care - PPO | Admitting: Bariatrics

## 2023-07-17 VITALS — BP 108/71 | HR 68 | Temp 97.7°F | Ht 66.0 in | Wt 180.0 lb

## 2023-07-17 DIAGNOSIS — E669 Obesity, unspecified: Secondary | ICD-10-CM | POA: Diagnosis not present

## 2023-07-17 DIAGNOSIS — R7303 Prediabetes: Secondary | ICD-10-CM

## 2023-07-17 DIAGNOSIS — Z6829 Body mass index (BMI) 29.0-29.9, adult: Secondary | ICD-10-CM | POA: Diagnosis not present

## 2023-07-17 NOTE — Progress Notes (Signed)
   WEIGHT SUMMARY AND BIOMETRICS  Weight Lost Since Last Visit: 2lb   Vitals Temp: 97.7 F (36.5 C) BP: 108/71 Pulse Rate: 68 SpO2: 97 %   Anthropometric Measurements Height: 5\' 6"  (1.676 m) Weight: 180 lb (81.6 kg) BMI (Calculated): 29.07 Weight at Last Visit: 182lb Weight Lost Since Last Visit: 2lb Starting Weight: 188lb Total Weight Loss (lbs): 8 lb (3.629 kg)   Body Composition  Body Fat %: 41.3 % Fat Mass (lbs): 74.6 lbs Muscle Mass (lbs): 100.6 lbs Total Body Water (lbs): 72.4 lbs Visceral Fat Rating : 10   Other Clinical Data Fasting: no Labs: no Today's Visit #: 5 Starting Date: 04/16/23    OBESITY Danielle Braun is here to discuss her progress with her obesity treatment plan along with follow-up of her obesity related diagnoses.   Nutrition Plan: the Category 2 plan - 85% adherence.  Current exercise:  exercise bike and rowing.   Interim History:  She is down 2 lbs since her last visit. Her mother was in the hospital and she ate out more. She has gotten back on track.  Not eating all of the food on the plan., Protein intake is less than prescribed., and Water intake is inadequate.She is focusing on getting her meals in, but is getting in more snacks.    Hunger is moderately controlled.  Cravings are moderately controlled.    Assessment/Plan:   Prediabetes Last A1c was 5.7  Medication(s): none Lab Results  Component Value Date   HGBA1C 5.7 (H) 04/16/2023   Lab Results  Component Value Date   INSULIN 9.7 04/16/2023    Plan: Will minimize all refined carbohydrates both sweets and starches.  Will work on the plan and exercise.  Consider both aerobic and resistance training.  Will keep protein, water, and fiber intake high.  Recipes that meet calorie and protein goals given.  Healthy snacks sheet.  Will get back on track.    Generalized Obesity: Current BMI BMI (Calculated): 29.07    Danielle Braun is currently in the action stage of  change. As such, her goal is to continue with weight loss efforts.  She has agreed to the Category 2 plan.  Exercise goals: All adults should avoid inactivity. Some physical activity is better than none, and adults who participate in any amount of physical activity gain some health benefits.  Behavioral modification strategies: increasing lean protein intake, decreasing simple carbohydrates , no meal skipping, meal planning , increase water intake, better snacking choices, planning for success, increasing vegetables, and keep healthy foods in the home.  Danielle Braun has agreed to follow-up with our clinic in 4 weeks.      Objective:   VITALS: Per patient if applicable, see vitals. GENERAL: Alert and in no acute distress. CARDIOPULMONARY: No increased WOB. Speaking in clear sentences.  PSYCH: Pleasant and cooperative. Speech normal rate and rhythm. Affect is appropriate. Insight and judgement are appropriate. Attention is focused, linear, and appropriate.  NEURO: Oriented as arrived to appointment on time with no prompting.   Attestation Statements:   This was prepared with the assistance of Engineer, civil (consulting).  Occasional wrong-word or sound-a-like substitutions may have occurred due to the inherent limitations of voice recognition software. Corinna Capra, DO

## 2023-08-07 ENCOUNTER — Other Ambulatory Visit (HOSPITAL_BASED_OUTPATIENT_CLINIC_OR_DEPARTMENT_OTHER): Payer: Self-pay

## 2023-08-07 MED ORDER — COVID-19 MRNA VAC-TRIS(PFIZER) 30 MCG/0.3ML IM SUSY
0.3000 mL | PREFILLED_SYRINGE | Freq: Once | INTRAMUSCULAR | 0 refills | Status: AC
  Filled 2023-08-07: qty 0.3, 1d supply, fill #0

## 2023-08-14 ENCOUNTER — Ambulatory Visit: Payer: Commercial Managed Care - PPO | Admitting: Bariatrics

## 2023-08-14 ENCOUNTER — Encounter: Payer: Self-pay | Admitting: Bariatrics

## 2023-08-14 VITALS — BP 117/72 | HR 65 | Temp 97.6°F | Ht 66.0 in | Wt 180.0 lb

## 2023-08-14 DIAGNOSIS — E669 Obesity, unspecified: Secondary | ICD-10-CM | POA: Diagnosis not present

## 2023-08-14 DIAGNOSIS — Z6829 Body mass index (BMI) 29.0-29.9, adult: Secondary | ICD-10-CM

## 2023-08-14 DIAGNOSIS — E66811 Obesity, class 1: Secondary | ICD-10-CM

## 2023-08-14 DIAGNOSIS — R7303 Prediabetes: Secondary | ICD-10-CM | POA: Diagnosis not present

## 2023-08-14 NOTE — Progress Notes (Signed)
   WEIGHT SUMMARY AND BIOMETRICS  Weight Lost Since Last Visit: 0  Weight Gained Since Last Visit: 0   Vitals Temp: 97.6 F (36.4 C) BP: 117/72 Pulse Rate: 65 SpO2: 99 %   Anthropometric Measurements Height: 5\' 6"  (1.676 m) Weight: 180 lb (81.6 kg) BMI (Calculated): 29.07 Weight at Last Visit: 180lb Weight Lost Since Last Visit: 0 Weight Gained Since Last Visit: 0 Starting Weight: 188lb Total Weight Loss (lbs): 8 lb (3.629 kg)   Body Composition  Body Fat %: 41.3 % Fat Mass (lbs): 74.4 lbs Muscle Mass (lbs): 100.4 lbs Total Body Water (lbs): 73 lbs Visceral Fat Rating : 10   Other Clinical Data Fasting: no Labs: no Today's Visit #: 6 Starting Date: 04/16/23    OBESITY Danielle Braun is here to discuss her progress with her obesity treatment plan along with follow-up of her obesity related diagnoses.     Nutrition Plan: the Category 2 plan - 75% adherence.  Current exercise: weightlifting and indoor bike.  Interim History:  Her weight remains the same. She is doing better with getting healthy snacks. She is trying to break " bad habits".  She is drinking more water at work. She is taking her lunch.  Eating all of the food on the plan., Protein intake is as prescribed, and Water intake is adequate.  Hunger is moderately controlled.  Cravings are moderately controlled.  Assessment/Plan:   Prediabetes Last A1c was 5.7  Lab Results  Component Value Date   HGBA1C 5.7 (H) 04/16/2023   Lab Results  Component Value Date   INSULIN 9.7 04/16/2023    Plan: Will minimize all refined carbohydrates both sweets and starches.  Will work on the plan and exercise.  Consider both aerobic and resistance training.  Will keep protein, water, and fiber intake high.  Given Strategies for the Holidays.  Aim for 7 to 9 hours of sleep nightly.  Will continue medications.     Generalized Obesity: Current BMI BMI (Calculated): 29.07    Danielle Braun is currently in  the action stage of change. As such, her goal is to continue with weight loss efforts.  She has agreed to the Category 2 plan.  Exercise goals: All adults should avoid inactivity. Some physical activity is better than none, and adults who participate in any amount of physical activity gain some health benefits. She still doing well with exercise (indoor bike, rowing, weights).   Behavioral modification strategies: increasing lean protein intake, decreasing simple carbohydrates , no meal skipping, increase water intake, decreasing sodium intake, get rid of junk food in the home, avoiding temptations, keep healthy foods in the home, pack lunch for work, and mindful eating.  Danielle Braun has agreed to follow-up with our clinic in 3 weeks.      Objective:   VITALS: Per patient if applicable, see vitals. GENERAL: Alert and in no acute distress. CARDIOPULMONARY: No increased WOB. Speaking in clear sentences.  PSYCH: Pleasant and cooperative. Speech normal rate and rhythm. Affect is appropriate. Insight and judgement are appropriate. Attention is focused, linear, and appropriate.  NEURO: Oriented as arrived to appointment on time with no prompting.   Attestation Statements:   This was prepared with the assistance of Engineer, civil (consulting).  Occasional wrong-word or sound-a-like substitutions may have occurred due to the inherent limitations of voice recognition software. Corinna Capra, DO

## 2023-08-31 ENCOUNTER — Other Ambulatory Visit (HOSPITAL_COMMUNITY): Payer: Self-pay

## 2023-09-02 ENCOUNTER — Other Ambulatory Visit (HOSPITAL_COMMUNITY): Payer: Self-pay

## 2023-09-10 ENCOUNTER — Ambulatory Visit: Payer: Commercial Managed Care - PPO | Admitting: Bariatrics

## 2023-09-11 ENCOUNTER — Encounter: Payer: Self-pay | Admitting: Bariatrics

## 2023-09-11 ENCOUNTER — Ambulatory Visit: Payer: Commercial Managed Care - PPO | Admitting: Bariatrics

## 2023-09-11 VITALS — BP 118/71 | HR 68 | Temp 97.8°F | Ht 66.0 in | Wt 182.0 lb

## 2023-09-11 DIAGNOSIS — E66811 Obesity, class 1: Secondary | ICD-10-CM

## 2023-09-11 DIAGNOSIS — R7303 Prediabetes: Secondary | ICD-10-CM

## 2023-09-11 DIAGNOSIS — Z6829 Body mass index (BMI) 29.0-29.9, adult: Secondary | ICD-10-CM

## 2023-09-11 NOTE — Progress Notes (Addendum)
WEIGHT SUMMARY AND BIOMETRICS  Weight Lost Since Last Visit: 0  Weight Gained Since Last Visit: 2lb   Vitals Temp: 97.8 F (36.6 C) BP: 118/71 Pulse Rate: 68 SpO2: 98 %   Anthropometric Measurements Height: 5\' 6"  (1.676 m) Weight: 182 lb (82.6 kg) BMI (Calculated): 29.39 Weight at Last Visit: 180lb Weight Lost Since Last Visit: 0 Weight Gained Since Last Visit: 2lb Starting Weight: 188lb Total Weight Loss (lbs): 6 lb (2.722 kg)   Body Composition  Body Fat %: 42.2 % Fat Mass (lbs): 77 lbs Muscle Mass (lbs): 100.2 lbs Total Body Water (lbs): 74.8 lbs Visceral Fat Rating : 11   Other Clinical Data Fasting: no Labs: no Today's Visit #: 7 Starting Date: 04/16/23    OBESITY Danielle Braun is here to discuss her progress with her obesity treatment plan along with follow-up of her obesity related diagnoses.    Nutrition Plan: the Category 2 plan - 70% adherence.  Current exercise:  rowing and indoor bike.  Interim History:  She is up 2 lbs since her last visit. She has been traveling. She is up approximately 2 lbs since her last visit.  Eating all of the food on the plan., Protein intake is as prescribed, Is not skipping meals, Water intake is adequate., and Reports polyphagia   Danielle Braun is moderately controlled.  Cravings are moderately controlled.  Assessment/Plan:   Prediabetes Last A1c was 5.7  Medication(s): none  Lab Results  Component Value Date   HGBA1C 5.7 (H) 04/16/2023   Lab Results  Component Value Date   INSULIN 9.7 04/16/2023    Plan:  Will minimize all refined carbohydrates both sweets and starches.  Will work on the plan and exercise.  Consider both aerobic and resistance training.  Will keep protein, water, and fiber intake high.  Aim for 7 to 9 hours of sleep nightly.  Will continue medications.  Will stay on  track. Discussed " My Fitness Pal" and the Food Journaling Plan.     Generalized Obesity: Current BMI BMI (Calculated): 29.39    Danielle Braun is currently in the action stage of change. As such, her goal is to continue with weight loss efforts.  She has agreed to the Category 2 plan.  Exercise goals: For substantial health benefits, adults should do at least 150 minutes (2 hours and 30 minutes) a week of moderate-intensity, or 75 minutes (1 hour and 15 minutes) a week of vigorous-intensity aerobic physical activity, or an equivalent combination of moderate- and vigorous-intensity aerobic activity. Aerobic activity should be performed in episodes of at least 10 minutes, and preferably, it should be spread throughout the week.  Behavioral modification strategies: increasing lean protein intake, no meal skipping, meal planning , better snacking choices, ways to avoid boredom eating, and keep healthy foods in the home.  Danielle Braun has agreed to follow-up with our clinic in 4 weeks.  Objective:   VITALS: Per patient if applicable, see vitals. GENERAL: Alert and in no acute distress. CARDIOPULMONARY: No increased WOB. Speaking in clear sentences.  PSYCH: Pleasant and cooperative. Speech normal rate and rhythm. Affect is appropriate. Insight and judgement are appropriate. Attention is focused, linear, and appropriate.  NEURO: Oriented as arrived to appointment on time with no prompting.   Attestation Statements:    This was prepared with the assistance of Engineer, civil (consulting).  Occasional wrong-word or sound-a-like substitutions may have occurred due to the inherent limitations of voice recognition   Corinna Capra, DO

## 2023-09-16 ENCOUNTER — Ambulatory Visit
Admission: RE | Admit: 2023-09-16 | Discharge: 2023-09-16 | Disposition: A | Discharge status: Home / Self Care | Payer: Commercial Managed Care - PPO | Source: Ambulatory Visit | Attending: Family Medicine | Admitting: Family Medicine

## 2023-09-16 ENCOUNTER — Other Ambulatory Visit: Payer: Self-pay

## 2023-09-16 VITALS — BP 137/82 | HR 74 | Temp 97.7°F | Resp 16

## 2023-09-16 DIAGNOSIS — L089 Local infection of the skin and subcutaneous tissue, unspecified: Secondary | ICD-10-CM

## 2023-09-16 DIAGNOSIS — L84 Corns and callosities: Secondary | ICD-10-CM | POA: Diagnosis not present

## 2023-09-16 MED ORDER — CEFADROXIL 500 MG PO CAPS: 500.0000 mg | ORAL_CAPSULE | Freq: Two times a day (BID) | ORAL | 0 refills | Status: DC

## 2023-09-16 NOTE — ED Triage Notes (Signed)
Has c/o rough area to right foot, woke up yesterday morning and area is red and inflamed. Has had to alter gait to walk more comfortably. Podiatrist cannot see her until December.

## 2023-09-16 NOTE — ED Provider Notes (Signed)
[facility]    CSN: [id number] Arrival date & time: 09/16/23  1040      History   Chief Complaint Chief Complaint  Patient presents with   Foot Pain    HPI Danielle Braun is a 62 y.o. female.   HPI Patient has a callus on her right foot.  She usually puts a donut pad on it and it walk normally.  For some reason she woke up yesterday morning and the area was red, inflamed, and very painful.  She could not walk on it even with her Band-Aid /pad.  Here for evaluation.  Denies trauma, change in activity, change in shoewear Past Medical History:  Diagnosis Date   Acne    Arrhythmia    Bursitis, subacromial    Left   Endometriosis    GERD (gastroesophageal reflux disease)    Joint pain    Palpitations    Retinal tear    Sinusitis    SOB (shortness of breath)    Vitamin D deficiency     Patient Active Problem List   Diagnosis Date Noted   Prediabetes 04/17/2023   History of colonic polyps 04/17/2023   Other fatigue 04/16/2023   SOB (shortness of breath) on exertion 04/16/2023   Health care maintenance 04/16/2023   Family history of diabetes mellitus 04/16/2023   Depression screening 04/16/2023   Generalized obesity 04/16/2023   BMI 30.0-30.9,adult 04/16/2023   Seasonal allergies 11/07/2022   Family history of osteoporosis 11/06/2022   Postmenopause 11/06/2022   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   Obesity (BMI 30-39.9) 01/26/2022   Patellofemoral pain syndrome of right knee 01/17/2022   Freiberg's disease, right 11/09/2021   Age-related osteoporosis without current pathological fracture 11/01/2021   Chronic sinusitis, unspecified 11/01/2021   Retinal tear of left eye 07/05/2021   History of laparoscopic-assisted vaginal hysterectomy 03/31/2021   History of sacrocolpopexy 03/31/2021   Blepharitis of upper and lower eyelids of both eyes 08/23/2020   Dry eye syndrome of both lacrimal glands 08/23/2020   Nuclear sclerotic cataract of both eyes  08/23/2020   Primary refluxing megaureter 03/02/2020   Sensation of feeling hot 01/18/2020   Chronic left shoulder pain 11/11/2018   Vitamin D insufficiency 11/11/2018   Low bone mass 09/26/2017   Immune to measles 02/20/2016   Allergic rhinitis 02/06/2016   GERD (gastroesophageal reflux disease) 02/06/2016   Hyperlipemia 02/06/2016   Keratoconjunctivitis sicca of both eyes not specified as Sjogren's 02/06/2016   Myopia of both eyes with astigmatism 02/06/2016   Posterior vitreous detachment of left eye 02/06/2016   Premature atrial contraction 02/06/2016   History of right salpingo-oophorectomy 03/14/2010    Past Surgical History:  Procedure Laterality Date   ABDOMINAL HYSTERECTOMY     ABDOMINAL SURGERY     csection   DILATION AND CURETTAGE OF UTERUS     SHOULDER ARTHROSCOPY WITH SUBACROMIAL DECOMPRESSION Left 10/31/2017   Procedure: SHOULDER ARTHROSCOPY WITH BURSECTOMY AND SUBACROMIAL DECOMPRESSION;  Surgeon: Jones Broom, MD;  Location: [facility] OR;  Service: Orthopedics;  Laterality: Left;  Left shoulder arthroscopy bursectomy and subacromial decompression   TONSILLECTOMY      OB History     Gravida  2   Para      Term      Preterm      AB      Living         SAB      IAB      Ectopic  Multiple      Live Births               Home Medications    Prior to Admission medications   Medication Sig Start Date End Date Taking? Authorizing Provider  cefadroxil (DURICEF) 500 MG capsule Take 1 capsule (500 mg total) by mouth 2 (two) times daily. 09/16/23  Yes Eustace Moore, MD  acetaminophen (TYLENOL) 500 MG tablet Take 1,000 mg by mouth every 6 (six) hours as needed for moderate pain or headache.    [provider]  Calcium Citrate-Vitamin D 315-250 MG-UNIT TABS calcium citrate 315 mg calcium-vitamin D3 6.25 mcg (250 unit) tablet 09/27/17   [provider]  Cholecalciferol (VITAMIN D3) 2000 units TABS Take 2,000 Units by mouth daily.     [provider]  cyclobenzaprine (FLEXERIL) 5 MG tablet Take 1 tablet (5 mg total) by mouth at bedtime as needed for muscle spasms. 08/24/22   Lattie Haw, MD  diphenhydrAMINE (BENADRYL) 25 mg capsule Take 25 mg by mouth at bedtime as needed.    [provider]  estradiol (ESTRACE) 0.1 MG/GM vaginal cream Place 1 Applicatorful vaginally at bedtime.    [provider]  fexofenadine (ALLEGRA) 60 MG tablet Take 60 mg by mouth daily as needed.     [provider]  fluticasone (FLONASE) 50 MCG/ACT nasal spray USE 2 SPRAYS IN EACH NOSTRIL ONCE A DAY 01/26/22     ipratropium (ATROVENT) 0.03 % nasal spray Place 2 sprays into both nostrils every 12 (twelve) hours. 12/15/21   Roxy Horseman, PA-C  magnesium citrate SOLN Take 150 mL PO twice daily for 3 days 06/09/23   Trevor Iha, FNP  Minoxidil (ROGAINE MENS EXTRA STRENGTH) 5 % FOAM     [provider]  naproxen sodium (ALEVE) 220 MG tablet Take by mouth. 11/12/18   [provider]  omeprazole (PRILOSEC) 20 MG capsule Take 1 capsule (20 mg total) by mouth daily. 04/25/23     traMADol (ULTRAM) 50 MG tablet Take 50 mg by mouth 2 (two) times daily as needed for moderate pain.    [provider]  venlafaxine XR (EFFEXOR-XR) 37.5 MG 24 hr capsule Take 1 capsule (37.5 mg total) by mouth daily. 04/25/23       Family History Family History  Problem Relation Age of Onset   Hyperlipidemia Mother    High blood pressure Mother    Depression Mother    Anxiety disorder Mother    Cancer Father        bladder   High blood pressure Father    High Cholesterol Father    Depression Father    Anxiety disorder Father    Diabetes Brother    Breast cancer Neg Hx     Social History Social History   Tobacco Use   Smoking status: Former    Types: Cigarettes   Smokeless tobacco: Never  Vaping Use   Vaping status: Never Used  Substance Use Topics   Alcohol use: Yes    Comment: occasional    Drug use: No     Allergies   Influenza virus vaccine   Review of Systems Review of Systems  See HPI Physical Exam Triage Vital Signs ED Triage Vitals  Encounter Vitals Group     BP 09/16/23 1048 137/82     Systolic BP Percentile --      Diastolic BP Percentile --      Pulse Rate 09/16/23 1048 74  Resp 09/16/23 1048 16     Temp 09/16/23 1048 97.7 F (36.5 C)     Temp src --      SpO2 09/16/23 1048 99 %     Weight --      Height --      Head Circumference --      Peak Flow --      Pain Score 09/16/23 1050 4     Pain Loc --      Pain Education --      Exclude from Growth Chart --    No data found.  Updated Vital Signs BP 137/82   Pulse 74   Temp 97.7 F (36.5 C)   Resp 16   SpO2 99%      Physical Exam Constitutional:      General: She is not in acute distress.    Appearance: She is well-developed.  HENT:     Head: Normocephalic and atraumatic.  Eyes:     Conjunctiva/sclera: Conjunctivae normal.     Pupils: Pupils are equal, round, and reactive to light.  Cardiovascular:     Rate and Rhythm: Normal rate.  Pulmonary:     Effort: Pulmonary effort is normal. No respiratory distress.  Musculoskeletal:        General: Normal range of motion.     Cervical back: Normal range of motion.       Feet:  Feet:     Comments: The foot was soaked and the area was pared down with a 15 blade scalpel.  No purulence identified Skin:    General: Skin is warm and dry.  Neurological:     Mental Status: She is alert.     Gait: Gait abnormal.      UC Treatments / Results  Labs (all labs ordered are listed, but only abnormal results are displayed) Labs Reviewed - No data to display  EKG   Radiology No results found.  Procedures Procedures (including critical care time)  Medications Ordered in UC Medications - No data to display  Initial Impression / Assessment and Plan / UC Course  I have reviewed the triage vital signs and the nursing  notes.  Pertinent labs & imaging results that were available during my care of the patient were reviewed by me and considered in my medical decision making (see chart for details).     Patient does not recall any trauma to the cord area.  I think the most reasonable explanation for an overnight swelling redness and pain would be infection.  I would have treated with Duricef and warm soaks.  She is to see her podiatrist in follow-up. Final Clinical Impressions(s) / UC Diagnoses   Final diagnoses:  Callus of foot  Skin infection     Discharge Instructions      Warm soaks 2 times a day for 20 minutes Take the cefadroxil antibiotic 2 times a day.  Take 2 doses Continue to wear donut pad See podiatry sooner if you fail to improve Call for problems   ED Prescriptions     Medication Sig Dispense Auth. Provider   cefadroxil (DURICEF) 500 MG capsule Take 1 capsule (500 mg total) by mouth 2 (two) times daily. 10 capsule Eustace Moore, MD      PDMP not reviewed this encounter.   Eustace Moore, MD 09/16/23 1336

## 2023-09-16 NOTE — Discharge Instructions (Signed)
Warm soaks 2 times a day for 20 minutes Take the cefadroxil antibiotic 2 times a day.  Take 2 doses Continue to wear donut pad See podiatry sooner if you fail to improve Call for problems

## 2023-09-19 ENCOUNTER — Ambulatory Visit
Admission: EM | Admit: 2023-09-19 | Discharge: 2023-09-19 | Disposition: A | Discharge status: Home / Self Care | Payer: Commercial Managed Care - PPO

## 2023-09-19 ENCOUNTER — Encounter: Payer: Self-pay | Admitting: Podiatry

## 2023-09-19 ENCOUNTER — Ambulatory Visit: Payer: Commercial Managed Care - PPO | Admitting: Podiatry

## 2023-09-19 DIAGNOSIS — L84 Corns and callosities: Secondary | ICD-10-CM

## 2023-09-19 DIAGNOSIS — D2372 Other benign neoplasm of skin of left lower limb, including hip: Secondary | ICD-10-CM

## 2023-09-19 NOTE — Discharge Instructions (Addendum)
Advised patient to follow-up with [facility] at 1115 this morning, Thursday, 09/19/2023.

## 2023-09-19 NOTE — ED Provider Notes (Signed)
[facility]    CSN: [id number] Arrival date & time: 09/19/23  0912      History   Chief Complaint Chief Complaint  Patient presents with   Foot Pain    HPI Danielle Braun is a 62 y.o. female.   HPI 62 year old female presents with callus of the right foot.  Patient was evaluated here on 09/16/2023.  Please see epic for that encounter note.  PMH significant for arrhythmia, vitamin D deficiency, and endometriosis.  Past Medical History:  Diagnosis Date   Acne    Arrhythmia    Bursitis, subacromial    Left   Endometriosis    GERD (gastroesophageal reflux disease)    Joint pain    Palpitations    Retinal tear    Sinusitis    SOB (shortness of breath)    Vitamin D deficiency     Patient Active Problem List   Diagnosis Date Noted   Prediabetes 04/17/2023   History of colonic polyps 04/17/2023   Other fatigue 04/16/2023   SOB (shortness of breath) on exertion 04/16/2023   Health care maintenance 04/16/2023   Family history of diabetes mellitus 04/16/2023   Depression screening 04/16/2023   Generalized obesity 04/16/2023   BMI 30.0-30.9,adult 04/16/2023   Seasonal allergies 11/07/2022   Family history of osteoporosis 11/06/2022   Postmenopause 11/06/2022   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   Obesity (BMI 30-39.9) 01/26/2022   Patellofemoral pain syndrome of right knee 01/17/2022   Freiberg's disease, right 11/09/2021   Age-related osteoporosis without current pathological fracture 11/01/2021   Chronic sinusitis, unspecified 11/01/2021   Retinal tear of left eye 07/05/2021   History of laparoscopic-assisted vaginal hysterectomy 03/31/2021   History of sacrocolpopexy 03/31/2021   Blepharitis of upper and lower eyelids of both eyes 08/23/2020   Dry eye syndrome of both lacrimal glands 08/23/2020   Nuclear sclerotic cataract of both eyes 08/23/2020   Primary refluxing megaureter 03/02/2020   Sensation of feeling hot 01/18/2020   Chronic left  shoulder pain 11/11/2018   Vitamin D insufficiency 11/11/2018   Low bone mass 09/26/2017   Immune to measles 02/20/2016   Allergic rhinitis 02/06/2016   GERD (gastroesophageal reflux disease) 02/06/2016   Hyperlipemia 02/06/2016   Keratoconjunctivitis sicca of both eyes not specified as Sjogren's 02/06/2016   Myopia of both eyes with astigmatism 02/06/2016   Posterior vitreous detachment of left eye 02/06/2016   Premature atrial contraction 02/06/2016   History of right salpingo-oophorectomy 03/14/2010    Past Surgical History:  Procedure Laterality Date   ABDOMINAL HYSTERECTOMY     ABDOMINAL SURGERY     csection   DILATION AND CURETTAGE OF UTERUS     SHOULDER ARTHROSCOPY WITH SUBACROMIAL DECOMPRESSION Left 10/31/2017   Procedure: SHOULDER ARTHROSCOPY WITH BURSECTOMY AND SUBACROMIAL DECOMPRESSION;  Surgeon: Jones Broom, MD;  Location: [facility] OR;  Service: Orthopedics;  Laterality: Left;  Left shoulder arthroscopy bursectomy and subacromial decompression   TONSILLECTOMY      OB History     Gravida  2   Para      Term      Preterm      AB      Living         SAB      IAB      Ectopic      Multiple      Live Births               Home Medications    Prior to  Admission medications   Medication Sig Start Date End Date Taking? Authorizing Provider  acetaminophen (TYLENOL) 500 MG tablet Take 1,000 mg by mouth every 6 (six) hours as needed for moderate pain or headache.    [provider]  Calcium Citrate-Vitamin D 315-250 MG-UNIT TABS calcium citrate 315 mg calcium-vitamin D3 6.25 mcg (250 unit) tablet 09/27/17   [provider]  cefadroxil (DURICEF) 500 MG capsule Take 1 capsule (500 mg total) by mouth 2 (two) times daily. 09/16/23   Eustace Moore, MD  Cholecalciferol (VITAMIN D3) 2000 units TABS Take 2,000 Units by mouth daily.    [provider]  cyclobenzaprine (FLEXERIL) 5 MG tablet Take 1 tablet (5 mg total) by mouth at  bedtime as needed for muscle spasms. 08/24/22   Lattie Haw, MD  diphenhydrAMINE (BENADRYL) 25 mg capsule Take 25 mg by mouth at bedtime as needed.    [provider]  estradiol (ESTRACE) 0.1 MG/GM vaginal cream Place 1 Applicatorful vaginally at bedtime.    [provider]  fexofenadine (ALLEGRA) 60 MG tablet Take 60 mg by mouth daily as needed.     [provider]  fluticasone (FLONASE) 50 MCG/ACT nasal spray USE 2 SPRAYS IN EACH NOSTRIL ONCE A DAY 01/26/22     ipratropium (ATROVENT) 0.03 % nasal spray Place 2 sprays into both nostrils every 12 (twelve) hours. 12/15/21   Roxy Horseman, PA-C  magnesium citrate SOLN Take 150 mL PO twice daily for 3 days 06/09/23   Trevor Iha, FNP  Minoxidil (ROGAINE MENS EXTRA STRENGTH) 5 % FOAM     [provider]  naproxen sodium (ALEVE) 220 MG tablet Take by mouth. 11/12/18   [provider]  omeprazole (PRILOSEC) 20 MG capsule Take 1 capsule (20 mg total) by mouth daily. 04/25/23     traMADol (ULTRAM) 50 MG tablet Take 50 mg by mouth 2 (two) times daily as needed for moderate pain.    [provider]  venlafaxine XR (EFFEXOR-XR) 37.5 MG 24 hr capsule Take 1 capsule (37.5 mg total) by mouth daily. 04/25/23       Family History Family History  Problem Relation Age of Onset   Hyperlipidemia Mother    High blood pressure Mother    Depression Mother    Anxiety disorder Mother    Cancer Father        bladder   High blood pressure Father    High Cholesterol Father    Depression Father    Anxiety disorder Father    Diabetes Brother    Breast cancer Neg Hx     Social History Social History   Tobacco Use   Smoking status: Former    Types: Cigarettes   Smokeless tobacco: Never  Vaping Use   Vaping status: Never Used  Substance Use Topics   Alcohol use: Yes    Comment: occasional   Drug use: No     Allergies   Influenza virus vaccine   Review of Systems Review of  Systems   Physical Exam Triage Vital Signs ED Triage Vitals  Encounter Vitals Group     BP      Systolic BP Percentile      Diastolic BP Percentile      Pulse      Resp      Temp      Temp src      SpO2      Weight      Height  Head Circumference      Peak Flow      Pain Score      Pain Loc      Pain Education      Exclude from Growth Chart    No data found.  Updated Vital Signs BP (!) 143/87   Pulse 67   Temp 97.7 F (36.5 C)   Resp 16   SpO2 98%   Visual Acuity Right Eye Distance:   Left Eye Distance:   Bilateral Distance:    Right Eye Near:   Left Eye Near:    Bilateral Near:     Physical Exam Vitals and nursing note reviewed.  Constitutional:      Appearance: Normal appearance. She is obese.  HENT:     Head: Normocephalic and atraumatic.     Mouth/Throat:     Mouth: Mucous membranes are moist.     Pharynx: Oropharynx is clear.  Eyes:     Extraocular Movements: Extraocular movements intact.     Conjunctiva/sclera: Conjunctivae normal.     Pupils: Pupils are equal, round, and reactive to light.  Cardiovascular:     Rate and Rhythm: Normal rate and regular rhythm.     Pulses: Normal pulses.     Heart sounds: Normal heart sounds.  Pulmonary:     Effort: Pulmonary effort is normal.     Breath sounds: Normal breath sounds. No wheezing, rhonchi or rales.  Musculoskeletal:     Cervical back: Normal range of motion and neck supple.  Skin:    General: Skin is warm and dry.     Comments: Right foot (sole): Callus of sole noted please see image below  Neurological:     General: No focal deficit present.     Mental Status: She is alert and oriented to person, place, and time. Mental status is at baseline.  Psychiatric:        Mood and Affect: Mood normal.        Behavior: Behavior normal.      UC Treatments / Results  Labs (all labs ordered are listed, but only abnormal results are displayed) Labs Reviewed - No data to  display  EKG   Radiology No results found.  Procedures Procedures (including critical care time)  Medications Ordered in UC Medications - No data to display  Initial Impression / Assessment and Plan / UC Course  I have reviewed the triage vital signs and the nursing notes.  Pertinent labs & imaging results that were available during my care of the patient were reviewed by me and considered in my medical decision making (see chart for details).     MDM: 1.  Callus of foot (left)-patient scheduled to see [facility] podiatry from this office visit today at 1115 am. Advised patient to follow-up with [facility] at 1115 this morning, Thursday, 09/19/2023.  Discharged home, hemodynamically stable. Final Clinical Impressions(s) / UC Diagnoses   Final diagnoses:  Callus of foot     Discharge Instructions      Advised patient to follow-up with [facility] at 1115 this morning, Thursday, 09/19/2023.     ED Prescriptions   None    PDMP not reviewed this encounter.   Trevor Iha, FNP 09/19/23 1110

## 2023-09-19 NOTE — Progress Notes (Signed)
  Subjective:  Patient ID: Danielle Braun, female    DOB: 1961/09/12,   MRN: [id number]  No chief complaint on file.   62 y.o. female presents for concern of lesion on her left foot that has been very painful for the past 4 weeks. She has been doing more activity and has had some issues with her right foot that has been following with Dr. Allena Katz. Relates she has had the lesion for a while but never had a problem . Denies any other pedal complaints. Denies n/v/f/c.   Past Medical History:  Diagnosis Date   Acne    Arrhythmia    Bursitis, subacromial    Left   Endometriosis    GERD (gastroesophageal reflux disease)    Joint pain    Palpitations    Retinal tear    Sinusitis    SOB (shortness of breath)    Vitamin D deficiency     Objective:  Physical Exam: Vascular: DP/PT pulses 2/4 bilateral. CFT <3 seconds. Normal hair growth on digits. No edema.  Skin. No lacerations or abrasions bilateral feet. Hyperkeratotic lesion noted to plantar fifth metatarsal x 2 on left with disruption of skin lines and cored region.   Musculoskeletal: MMT 5/5 bilateral lower extremities in DF, PF, Inversion and Eversion. Deceased ROM in DF of ankle joint.  Neurological: Sensation intact to light touch.   Assessment:   1. Benign neoplasm of skin of left foot      Plan:  Patient was evaluated and treated and all questions answered. -Discussed benign skin lesions with patient and treatment options.  -Hyperkeratotic tissue was debrided with chisel without incident.  -Applied salycylic acid treatment to area with dressing. Advised to remove bandaging tomorrow.  -Encouraged daily moisturizing -Discussed use of pumice stone -Advised good supportive shoes and inserts -Patient to return to office as needed or sooner if condition worsens.   Louann Sjogren, DPM

## 2023-09-19 NOTE — ED Triage Notes (Addendum)
[facility] with [facility] of [facility] pain to left foot. Pt report she was here on Monday and came in to see [place] and dr. Delton See gave her an antibiotic and started to feel better but symptoms are worsening again unable to see podiatrist until late December and is in pain now.

## 2023-09-30 ENCOUNTER — Ambulatory Visit: Payer: Commercial Managed Care - PPO | Admitting: Podiatry

## 2023-10-04 ENCOUNTER — Ambulatory Visit: Payer: Commercial Managed Care - PPO | Admitting: Podiatry

## 2023-10-04 ENCOUNTER — Encounter: Payer: Self-pay | Admitting: Podiatry

## 2023-10-04 DIAGNOSIS — D2371 Other benign neoplasm of skin of right lower limb, including hip: Secondary | ICD-10-CM | POA: Diagnosis not present

## 2023-10-04 DIAGNOSIS — M9271 Juvenile osteochondrosis of metatarsus, right foot: Secondary | ICD-10-CM

## 2023-10-04 DIAGNOSIS — D2372 Other benign neoplasm of skin of left lower limb, including hip: Secondary | ICD-10-CM

## 2023-10-04 DIAGNOSIS — M722 Plantar fascial fibromatosis: Secondary | ICD-10-CM | POA: Diagnosis not present

## 2023-10-04 NOTE — Progress Notes (Signed)
  Subjective:  Patient ID: Danielle Braun, female    DOB: 1960/12/23,   MRN: [id number]  No chief complaint on file.   62 y.o. female presents for follow-up of lesions and plantar fasciits. Relates doing well and pain on left is resolved. Still gets some chronic pain in her right heel and has been stretching and wearing support.  . Denies any other pedal complaints. Denies n/v/f/c.   Past Medical History:  Diagnosis Date   Acne    Arrhythmia    Bursitis, subacromial    Left   Endometriosis    GERD (gastroesophageal reflux disease)    Joint pain    Palpitations    Retinal tear    Sinusitis    SOB (shortness of breath)    Vitamin D deficiency     Objective:  Physical Exam: Vascular: DP/PT pulses 2/4 bilateral. CFT <3 seconds. Normal hair growth on digits. No edema.  Skin. No lacerations or abrasions bilateral feet. Hyperkeratotic lesion noted to plantar fifth metatarsal x 2 on left with disruption of skin lines and cored region.  Hyeprkeratosis x 2 noted to right foot.  Musculoskeletal: MMT 5/5 bilateral lower extremities in DF, PF, Inversion and Eversion. Deceased ROM in DF of ankle joint.  Neurological: Sensation intact to light touch.   Assessment:   1. Benign neoplasm of skin of left foot   2. Plantar fasciitis of right foot   3. Freiberg's disease, right   4. Benign neoplasm of skin of right foot       Plan:  Patient was evaluated and treated and all questions answered. -Discussed benign skin lesions with patient and treatment options.  -Hyperkeratotic tissue was debrided with chisel without incident as courtesy today  -Applied salycylic acid treatment to area with dressing. Advised to remove bandaging tomorrow.  -Encouraged daily moisturizing -Discussed use of pumice stone -Advised good supportive shoes and inserts Discussed plantar fasciitis with patient.  X-rays reviewed and discussed with patient. No acute fractures or dislocations noted. Mild spurring noted  at inferior calcaneus.  Discussed treatment options including, ice, NSAIDS, supportive shoes, bracing, and stretching.  Continue stretching and support  Anti-inflammatories as needed.  Follow-up as needed.       Danielle Braun, DPM

## 2023-10-09 ENCOUNTER — Ambulatory Visit: Payer: Commercial Managed Care - PPO | Admitting: Nurse Practitioner

## 2023-10-09 ENCOUNTER — Encounter: Payer: Self-pay | Admitting: Nurse Practitioner

## 2023-10-09 VITALS — BP 122/77 | HR 73 | Temp 97.5°F | Ht 66.0 in | Wt 177.0 lb

## 2023-10-09 DIAGNOSIS — E7849 Other hyperlipidemia: Secondary | ICD-10-CM

## 2023-10-09 DIAGNOSIS — E669 Obesity, unspecified: Secondary | ICD-10-CM | POA: Diagnosis not present

## 2023-10-09 DIAGNOSIS — Z6828 Body mass index (BMI) 28.0-28.9, adult: Secondary | ICD-10-CM

## 2023-10-09 DIAGNOSIS — R7303 Prediabetes: Secondary | ICD-10-CM

## 2023-10-09 NOTE — Progress Notes (Signed)
Office: [phone]  /  Fax: [phone]  WEIGHT SUMMARY AND BIOMETRICS  Weight Lost Since Last Visit: 5lb  Weight Gained Since Last Visit: 0lb   Vitals Temp: (!) 97.5 F (36.4 C) BP: 122/77 Pulse Rate: 73 SpO2: 99 %   Anthropometric Measurements Height: 5\' 6"  (1.676 m) Weight: 177 lb (80.3 kg) BMI (Calculated): 28.58 Weight at Last Visit: 182lb Weight Lost Since Last Visit: 5lb Weight Gained Since Last Visit: 0lb Starting Weight: 188lb Total Weight Loss (lbs): 11 lb (4.99 kg)   Body Composition  Body Fat %: 39.9 % Fat Mass (lbs): 70.8 lbs Muscle Mass (lbs): 101.4 lbs Total Body Water (lbs): 70.2 lbs Visceral Fat Rating : 10   Other Clinical Data Fasting: No Labs: No Today's Visit #: 8 Starting Date: 04/16/23     HPI  Chief Complaint: OBESITY  Danielle Braun is here to discuss her progress with her obesity treatment plan. She is on the the Category 2 Plan and states she is following her eating plan approximately 80 % of the time. She states she is exercising 40 minutes 4 days per week.   Interval History:  Since last office visit she has lost 5 pounds.  She notes she did well over Thanksgiving. Doing well with protein and water intake.    Pharmacotherapy for weight loss: She is not currently taking medications  for medical weight loss.    Previous pharmacotherapy for medical weight loss:  none   Bariatric surgery:  Has not had bariatric surgery  Hyperlipidemia Medication(s): never been on meds   FH:  father  Lab Results  Component Value Date   CHOL 207 (H) 04/16/2023   HDL 71 04/16/2023   LDLCALC 120 (H) 04/16/2023   TRIG 88 04/16/2023   Lab Results  Component Value Date   ALT 18 04/16/2023   AST 22 04/16/2023   ALKPHOS 74 04/16/2023   BILITOT 0.4 04/16/2023   The 10-year ASCVD risk score (Arnett DK, et al., 2019) is: 2.9%   Values used to calculate the score:     Age: 62 years     Sex: Female     Is Non-Hispanic African American: No      Diabetic: No     Tobacco smoker: No     Systolic Blood Pressure: 122 mmHg     Is BP treated: No     HDL Cholesterol: 71 mg/dL     Total Cholesterol: 207 mg/dL   Prediabetes Last G9F was 5.7  Medication(s): never been on meds Polyphagia:Yes Lab Results  Component Value Date   HGBA1C 5.7 (H) 04/16/2023   Lab Results  Component Value Date   INSULIN 9.7 04/16/2023     PHYSICAL EXAM:  Blood pressure 122/77, pulse 73, temperature (!) 97.5 F (36.4 C), height 5\' 6"  (1.676 m), weight 177 lb (80.3 kg), SpO2 99%. Body mass index is 28.57 kg/m.  General: She is overweight, cooperative, alert, well developed, and in no acute distress. PSYCH: Has normal mood, affect and thought process.   Extremities: No edema.  Neurologic: No gross sensory or motor deficits. No tremors or fasciculations noted.    DIAGNOSTIC DATA REVIEWED:  BMET    Component Value Date/Time   NA 139 04/16/2023 0856   K 4.2 04/16/2023 0856   CL 102 04/16/2023 0856   CO2 24 04/16/2023 0856   GLUCOSE 91 04/16/2023 0856   GLUCOSE 90 10/17/2017 0920   BUN 14 04/16/2023 0856   CREATININE 0.69 04/16/2023 0856   CALCIUM 9.2  04/16/2023 0856   GFRNONAA >60 10/17/2017 0920   GFRAA >60 10/17/2017 0920   Lab Results  Component Value Date   HGBA1C 5.7 (H) 04/16/2023   Lab Results  Component Value Date   INSULIN 9.7 04/16/2023   Lab Results  Component Value Date   TSH 1.680 04/16/2023   CBC    Component Value Date/Time   WBC 5.2 04/16/2023 0856   WBC 4.3 10/17/2017 0920   RBC 4.14 04/16/2023 0856   RBC 4.26 10/17/2017 0920   HGB 12.9 04/16/2023 0856   HCT 38.5 04/16/2023 0856   PLT 278 04/16/2023 0856   MCV 93 04/16/2023 0856   MCH 31.2 04/16/2023 0856   MCH 31.7 10/17/2017 0920   MCHC 33.5 04/16/2023 0856   MCHC 33.7 10/17/2017 0920   RDW 12.3 04/16/2023 0856   Iron Studies No results found for: "IRON", "TIBC", "FERRITIN", "IRONPCTSAT" Lipid Panel     Component Value Date/Time   CHOL 207 (H)  04/16/2023 0856   TRIG 88 04/16/2023 0856   HDL 71 04/16/2023 0856   LDLCALC 120 (H) 04/16/2023 0856   Hepatic Function Panel     Component Value Date/Time   PROT 6.7 04/16/2023 0856   ALBUMIN 4.2 04/16/2023 0856   AST 22 04/16/2023 0856   ALT 18 04/16/2023 0856   ALKPHOS 74 04/16/2023 0856   BILITOT 0.4 04/16/2023 0856      Component Value Date/Time   TSH 1.680 04/16/2023 0856   Nutritional Lab Results  Component Value Date   VD25OH 51.7 04/16/2023     ASSESSMENT AND PLAN  TREATMENT PLAN FOR OBESITY:  Recommended Dietary Goals  Danielle Braun is currently in the action stage of change. As such, her goal is to continue weight management plan. She has agreed to the Category 2 Plan.  Behavioral Intervention  We discussed the following Behavioral Modification Strategies today: increasing lean protein intake to established goals, decreasing simple carbohydrates , increasing vegetables, increasing fiber rich foods, increasing water intake , work on meal planning and preparation, reading food labels , keeping healthy foods at home, planning for success, celebration eating strategies, and continue to work on maintaining a reduced calorie state, getting the recommended amount of protein, incorporating whole foods, making healthy choices, staying well hydrated and practicing mindfulness when eating..  Additional resources provided today: NA  Recommended Physical Activity Goals  Danielle Braun has been advised to work up to 150 minutes of moderate intensity aerobic activity a week and strengthening exercises 2-3 times per week for cardiovascular health, weight loss maintenance and preservation of muscle mass.   She has agreed to Continue current level of physical activity    ASSOCIATED CONDITIONS ADDRESSED TODAY  Action/Plan  Other hyperlipidemia Cardiovascular risk and specific lipid/LDL goals reviewed.  We discussed several lifestyle modifications today and Danielle Braun will continue to  work on diet, exercise and weight loss efforts. Orders and follow up as documented in patient record.   Counseling Intensive lifestyle modifications are the first line treatment for this issue. Dietary changes: Increase soluble fiber. Decrease simple carbohydrates. Exercise changes: Moderate to vigorous-intensity aerobic activity 150 minutes per week if tolerated. Lipid-lowering medications: see documented in medical record.   Prediabetes Danielle Braun will continue to work on weight loss, exercise, and decreasing simple carbohydrates to help decrease the risk of diabetes.    Generalized obesity  BMI 28.0-28.9,adult     Will obtain labs at next visit.      Return in about 8 weeks (around 12/04/2023).Marland Kitchen She was informed of  the importance of frequent follow up visits to maximize her success with intensive lifestyle modifications for her multiple health conditions.   ATTESTASTION STATEMENTS:  Reviewed by clinician on day of visit: allergies, medications, problem list, medical history, surgical history, family history, social history, and previous encounter notes.   Time spent on visit including pre-visit chart review and post-visit care and charting was 30 minutes.    Theodis Sato. Carly Applegate FNP-C

## 2023-10-29 ENCOUNTER — Other Ambulatory Visit (HOSPITAL_COMMUNITY): Payer: Self-pay

## 2023-10-29 DIAGNOSIS — M76899 Other specified enthesopathies of unspecified lower limb, excluding foot: Secondary | ICD-10-CM | POA: Diagnosis not present

## 2023-10-29 DIAGNOSIS — R7303 Prediabetes: Secondary | ICD-10-CM | POA: Diagnosis not present

## 2023-10-29 DIAGNOSIS — R6889 Other general symptoms and signs: Secondary | ICD-10-CM | POA: Diagnosis not present

## 2023-10-29 DIAGNOSIS — R030 Elevated blood-pressure reading, without diagnosis of hypertension: Secondary | ICD-10-CM | POA: Diagnosis not present

## 2023-10-29 DIAGNOSIS — E559 Vitamin D deficiency, unspecified: Secondary | ICD-10-CM | POA: Diagnosis not present

## 2023-10-29 DIAGNOSIS — M7062 Trochanteric bursitis, left hip: Secondary | ICD-10-CM | POA: Diagnosis not present

## 2023-10-29 MED ORDER — VENLAFAXINE HCL ER 37.5 MG PO CP24
37.5000 mg | ORAL_CAPSULE | Freq: Every day | ORAL | 1 refills | Status: DC
  Filled 2023-10-29 – 2023-11-19 (×2): qty 90, 90d supply, fill #0
  Filled 2024-02-26: qty 90, 90d supply, fill #1

## 2023-11-04 DIAGNOSIS — H5213 Myopia, bilateral: Secondary | ICD-10-CM | POA: Diagnosis not present

## 2023-11-04 DIAGNOSIS — H52203 Unspecified astigmatism, bilateral: Secondary | ICD-10-CM | POA: Diagnosis not present

## 2023-11-04 DIAGNOSIS — H02834 Dermatochalasis of left upper eyelid: Secondary | ICD-10-CM | POA: Diagnosis not present

## 2023-11-04 DIAGNOSIS — H43813 Vitreous degeneration, bilateral: Secondary | ICD-10-CM | POA: Diagnosis not present

## 2023-11-04 DIAGNOSIS — H2513 Age-related nuclear cataract, bilateral: Secondary | ICD-10-CM | POA: Diagnosis not present

## 2023-11-04 DIAGNOSIS — R7303 Prediabetes: Secondary | ICD-10-CM | POA: Diagnosis not present

## 2023-11-04 DIAGNOSIS — H02831 Dermatochalasis of right upper eyelid: Secondary | ICD-10-CM | POA: Diagnosis not present

## 2023-11-04 DIAGNOSIS — H524 Presbyopia: Secondary | ICD-10-CM | POA: Diagnosis not present

## 2023-11-04 DIAGNOSIS — H31002 Unspecified chorioretinal scars, left eye: Secondary | ICD-10-CM | POA: Diagnosis not present

## 2023-11-13 ENCOUNTER — Ambulatory Visit: Payer: Commercial Managed Care - PPO | Admitting: Family Medicine

## 2023-11-15 ENCOUNTER — Ambulatory Visit: Payer: Commercial Managed Care - PPO | Admitting: Sports Medicine

## 2023-11-15 ENCOUNTER — Other Ambulatory Visit (HOSPITAL_COMMUNITY): Payer: Self-pay

## 2023-11-15 ENCOUNTER — Other Ambulatory Visit (HOSPITAL_BASED_OUTPATIENT_CLINIC_OR_DEPARTMENT_OTHER): Payer: Self-pay

## 2023-11-15 ENCOUNTER — Other Ambulatory Visit: Payer: Self-pay

## 2023-11-15 ENCOUNTER — Ambulatory Visit: Payer: Commercial Managed Care - PPO

## 2023-11-15 DIAGNOSIS — S83207D Unspecified tear of unspecified meniscus, current injury, left knee, subsequent encounter: Secondary | ICD-10-CM | POA: Diagnosis not present

## 2023-11-15 DIAGNOSIS — M25462 Effusion, left knee: Secondary | ICD-10-CM

## 2023-11-15 DIAGNOSIS — M25761 Osteophyte, right knee: Secondary | ICD-10-CM | POA: Diagnosis not present

## 2023-11-15 DIAGNOSIS — S83241D Other tear of medial meniscus, current injury, right knee, subsequent encounter: Secondary | ICD-10-CM | POA: Diagnosis not present

## 2023-11-15 DIAGNOSIS — S83207A Unspecified tear of unspecified meniscus, current injury, left knee, initial encounter: Secondary | ICD-10-CM | POA: Insufficient documentation

## 2023-11-15 DIAGNOSIS — M25562 Pain in left knee: Secondary | ICD-10-CM | POA: Diagnosis not present

## 2023-11-15 DIAGNOSIS — M1712 Unilateral primary osteoarthritis, left knee: Secondary | ICD-10-CM | POA: Diagnosis not present

## 2023-11-15 MED ORDER — CYCLOBENZAPRINE HCL 10 MG PO TABS
ORAL_TABLET | ORAL | 0 refills | Status: DC
Start: 2023-11-15 — End: 2023-11-15
  Filled 2023-11-15: qty 30, fill #0

## 2023-11-15 MED ORDER — CYCLOBENZAPRINE HCL 10 MG PO TABS
ORAL_TABLET | ORAL | 0 refills | Status: AC
Start: 2023-11-15 — End: ?

## 2023-11-15 MED ORDER — CELECOXIB 200 MG PO CAPS: ORAL_CAPSULE | ORAL | 2 refills | Status: DC

## 2023-11-15 NOTE — Progress Notes (Signed)
    Procedures performed today:    None.  Independent interpretation of notes and tests performed by another provider:   None.  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 Occasional buckling. On exam she does have a positive McMurray's sign I suspect meniscal tearing, adding x-rays, MRI, she will do Celebrex and Flexeril per her request, reaction knee brace, return to see me for MRI results, we will consider injection at that time. She will also message me and let me know if she would like some work restrictions.    ____________________________________________ Ihor Austin. Benjamin Stain, M.D., ABFM., CAQSM., AME. Primary Care and Sports Medicine [facility]  Adjunct Professor of Family Medicine  [place] [employer]  Restaurant manager, fast food

## 2023-11-15 NOTE — Assessment & Plan Note (Addendum)
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 Occasional buckling. On exam she does have a positive McMurray's sign I suspect meniscal tearing, adding x-rays, MRI, she will do Celebrex and Flexeril per her request, reaction knee brace, return to see me for MRI results, we will consider injection at that time. She will also message me and let me know if she would like some work restrictions.

## 2023-11-16 ENCOUNTER — Other Ambulatory Visit (HOSPITAL_COMMUNITY): Payer: Self-pay

## 2023-11-19 ENCOUNTER — Ambulatory Visit: Payer: Commercial Managed Care - PPO

## 2023-11-19 ENCOUNTER — Other Ambulatory Visit (HOSPITAL_COMMUNITY): Payer: Self-pay

## 2023-11-19 DIAGNOSIS — M25462 Effusion, left knee: Secondary | ICD-10-CM | POA: Diagnosis not present

## 2023-11-19 DIAGNOSIS — S83207D Unspecified tear of unspecified meniscus, current injury, left knee, subsequent encounter: Secondary | ICD-10-CM

## 2023-11-19 DIAGNOSIS — M25562 Pain in left knee: Secondary | ICD-10-CM

## 2023-11-19 DIAGNOSIS — M948X6 Other specified disorders of cartilage, lower leg: Secondary | ICD-10-CM | POA: Diagnosis not present

## 2023-11-20 ENCOUNTER — Other Ambulatory Visit: Payer: Self-pay

## 2023-11-20 ENCOUNTER — Other Ambulatory Visit (HOSPITAL_COMMUNITY): Payer: Self-pay

## 2023-11-20 MED ORDER — FLUTICASONE PROPIONATE 50 MCG/ACT NA SUSP
2.0000 | Freq: Every day | NASAL | 3 refills | Status: DC
  Filled 2023-11-20: qty 48, 90d supply, fill #0
  Filled 2024-02-26: qty 48, 90d supply, fill #1
  Filled 2024-05-22: qty 48, 90d supply, fill #2
  Filled 2024-08-20: qty 48, 90d supply, fill #3

## 2023-11-23 ENCOUNTER — Encounter: Payer: Self-pay | Admitting: Sports Medicine

## 2023-11-26 ENCOUNTER — Other Ambulatory Visit: Payer: Self-pay | Admitting: Family Medicine

## 2023-11-26 DIAGNOSIS — Z1231 Encounter for screening mammogram for malignant neoplasm of breast: Secondary | ICD-10-CM

## 2023-11-27 ENCOUNTER — Ambulatory Visit: Payer: Commercial Managed Care - PPO | Admitting: Sports Medicine

## 2023-11-27 DIAGNOSIS — S83207D Unspecified tear of unspecified meniscus, current injury, left knee, subsequent encounter: Secondary | ICD-10-CM

## 2023-11-27 NOTE — Progress Notes (Signed)
    Procedures performed today:    None.  Independent interpretation of notes and tests performed by another provider:   None.  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 We added Celebrex and Flexeril, a reaction knee brace, she has done really well, knee pain is improving considerably. We will add some formal PT, she will continue her knee brace, Flexeril, and we can consider injection in the future if needed. She did mention PRP, I did explain the limitations in data with PRP injections for meniscal tearing, and we could still use it as a [facility] play should everything else fail. In the meantime she will do PT and avoid deep knee flexion and planting and twisting.    ____________________________________________ Ihor Austin. Benjamin Stain, M.D., ABFM., CAQSM., AME. Primary Care and Sports Medicine [facility]  Adjunct Professor of Family Medicine  [place] [employer] [facility] [employer] [facility] [employer]  Restaurant manager, fast food

## 2023-11-27 NOTE — Assessment & Plan Note (Addendum)
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 We added Celebrex and Flexeril, a reaction knee brace, she has done really well, knee pain is improving considerably. We will add some formal PT, she will continue her knee brace, Flexeril, and we can consider injection in the future if needed. She did mention PRP, I did explain the limitations in data with PRP injections for meniscal tearing, and we could still use it as a [facility] play should everything else fail. In the meantime she will do PT and avoid deep knee flexion and planting and twisting.

## 2023-11-28 ENCOUNTER — Encounter: Payer: Self-pay | Admitting: Sports Medicine

## 2023-11-28 ENCOUNTER — Ambulatory Visit: Payer: Commercial Managed Care - PPO | Admitting: Sports Medicine

## 2023-12-04 ENCOUNTER — Encounter: Payer: Self-pay | Admitting: Nurse Practitioner

## 2023-12-04 ENCOUNTER — Ambulatory Visit: Payer: Commercial Managed Care - PPO | Admitting: Nurse Practitioner

## 2023-12-04 VITALS — BP 130/78 | HR 62 | Temp 98.0°F | Ht 66.0 in | Wt 177.0 lb

## 2023-12-04 DIAGNOSIS — E669 Obesity, unspecified: Secondary | ICD-10-CM | POA: Diagnosis not present

## 2023-12-04 DIAGNOSIS — Z79899 Other long term (current) drug therapy: Secondary | ICD-10-CM | POA: Diagnosis not present

## 2023-12-04 DIAGNOSIS — Z862 Personal history of diseases of the blood and blood-forming organs and certain disorders involving the immune mechanism: Secondary | ICD-10-CM | POA: Diagnosis not present

## 2023-12-04 DIAGNOSIS — Z6828 Body mass index (BMI) 28.0-28.9, adult: Secondary | ICD-10-CM | POA: Diagnosis not present

## 2023-12-04 DIAGNOSIS — E7849 Other hyperlipidemia: Secondary | ICD-10-CM | POA: Diagnosis not present

## 2023-12-04 DIAGNOSIS — R7303 Prediabetes: Secondary | ICD-10-CM

## 2023-12-04 NOTE — Progress Notes (Addendum)
 Office: [phone]  /  Fax: [phone]  WEIGHT SUMMARY AND BIOMETRICS  Weight Lost Since Last Visit: 0lb  Weight Gained Since Last Visit: 0lb   Vitals Temp: 98 F (36.7 C) BP: 130/78 Pulse Rate: 62 SpO2: 100 %   Anthropometric Measurements Height: 5' 6 (1.676 m) Weight: 177 lb (80.3 kg) BMI (Calculated): 28.58 Weight at Last Visit: 177lb Weight Lost Since Last Visit: 0lb Weight Gained Since Last Visit: 0lb Starting Weight: 188lb Total Weight Loss (lbs): 11 lb (4.99 kg)   Body Composition  Body Fat %: 41.1 % Fat Mass (lbs): 72.8 lbs Muscle Mass (lbs): 99.2 lbs Total Body Water  (lbs): 70.4 lbs Visceral Fat Rating : 10   Other Clinical Data Fasting: Yes Labs: Yes Today's Visit #: 9 Starting Date: 04/16/23     HPI  Chief Complaint: OBESITY  Danielle Braun is here to discuss her progress with her obesity treatment plan. She is on the the Category 2 Plan and states she is following her eating plan approximately 80 % of the time. She states she is exercising 20 minutes 7 days per week.   Interval History:  Since last office visit she has maintained her weight.  She is struggling with L knee pain and is currently wearing a brace.  She saw Dr.  ONEIDA last on 11/27/23. She is starting PT tomorrow.    Pharmacotherapy for weight loss: She is not currently taking medications  for medical weight loss.    Previous pharmacotherapy for medical weight loss:  none   Bariatric surgery:  Has not had bariatric surgery  Prediabetes Last A1c was 5.8 on 10/29/23  Medication(s): None-have discussed Metformin in the past.   Polyphagia:Yes FH:  bother type 1 Pat aunt DMT2 Pat gm DMT2  Lab Results  Component Value Date   HGBA1C 5.7 (H) 04/16/2023   Lab Results  Component Value Date   INSULIN  9.7 04/16/2023   Hyperlipidemia Medication(s): None.  FH:  father with HLD  Lab Results  Component Value Date   CHOL 207 (H) 04/16/2023   HDL 71 04/16/2023   LDLCALC 120 (H)  04/16/2023   TRIG 88 04/16/2023   Lab Results  Component Value Date   ALT 18 04/16/2023   AST 22 04/16/2023   ALKPHOS 74 04/16/2023   BILITOT 0.4 04/16/2023   The 10-year ASCVD risk score (Arnett DK, et al., 2019) is: 3.3%   Values used to calculate the score:     Age: 14 years     Sex: Female     Is Non-Hispanic African American: No     Diabetic: No     Tobacco smoker: No     Systolic Blood Pressure: 130 mmHg     Is BP treated: No     HDL Cholesterol: 71 mg/dL     Total Cholesterol: 207 mg/dL    History of anemia Taking a MVI without iron.  No history of iron infusions.  Went to give blood recently and wasn't able to due to a low HGB.  Denies fatigue.    PHYSICAL EXAM:  Blood pressure 130/78, pulse 62, temperature 98 F (36.7 C), height 5' 6 (1.676 m), weight 177 lb (80.3 kg), SpO2 100%. Body mass index is 28.57 kg/m.  General: She is overweight, cooperative, alert, well developed, and in no acute distress. PSYCH: Has normal mood, affect and thought process.   Extremities: No edema.  Neurologic: No gross sensory or motor deficits. No tremors or fasciculations noted.    DIAGNOSTIC DATA  REVIEWED:  BMET    Component Value Date/Time   NA 139 04/16/2023 0856   K 4.2 04/16/2023 0856   CL 102 04/16/2023 0856   CO2 24 04/16/2023 0856   GLUCOSE 91 04/16/2023 0856   GLUCOSE 90 10/17/2017 0920   BUN 14 04/16/2023 0856   CREATININE 0.69 04/16/2023 0856   CALCIUM 9.2 04/16/2023 0856   GFRNONAA >60 10/17/2017 0920   GFRAA >60 10/17/2017 0920   Lab Results  Component Value Date   HGBA1C 5.7 (H) 04/16/2023   Lab Results  Component Value Date   INSULIN  9.7 04/16/2023   Lab Results  Component Value Date   TSH 1.680 04/16/2023   CBC    Component Value Date/Time   WBC 5.2 04/16/2023 0856   WBC 4.3 10/17/2017 0920   RBC 4.14 04/16/2023 0856   RBC 4.26 10/17/2017 0920   HGB 12.9 04/16/2023 0856   HCT 38.5 04/16/2023 0856   PLT 278 04/16/2023 0856   MCV 93  04/16/2023 0856   MCH 31.2 04/16/2023 0856   MCH 31.7 10/17/2017 0920   MCHC 33.5 04/16/2023 0856   MCHC 33.7 10/17/2017 0920   RDW 12.3 04/16/2023 0856   Iron Studies No results found for: IRON, TIBC, FERRITIN, IRONPCTSAT Lipid Panel     Component Value Date/Time   CHOL 207 (H) 04/16/2023 0856   TRIG 88 04/16/2023 0856   HDL 71 04/16/2023 0856   LDLCALC 120 (H) 04/16/2023 0856   Hepatic Function Panel     Component Value Date/Time   PROT 6.7 04/16/2023 0856   ALBUMIN 4.2 04/16/2023 0856   AST 22 04/16/2023 0856   ALT 18 04/16/2023 0856   ALKPHOS 74 04/16/2023 0856   BILITOT 0.4 04/16/2023 0856      Component Value Date/Time   TSH 1.680 04/16/2023 0856   Nutritional Lab Results  Component Value Date   VD25OH 51.7 04/16/2023     ASSESSMENT AND PLAN  TREATMENT PLAN FOR OBESITY:  Recommended Dietary Goals  Danielle Braun is currently in the action stage of change. As such, her goal is to continue weight management plan. She has agreed to the Category 2 Plan.  Behavioral Intervention  We discussed the following Behavioral Modification Strategies today: increasing lean protein intake to established goals, decreasing simple carbohydrates , increasing vegetables, increasing water  intake , work on meal planning and preparation, work on counselling psychologist calories using tracking application, continue to work on implementation of reduced calorie nutritional plan, continue to practice mindfulness when eating, planning for success, and continue to work on maintaining a reduced calorie state, getting the recommended amount of protein, incorporating whole foods, making healthy choices, staying well hydrated and practicing mindfulness when eating..  Additional resources provided today: NA  Recommended Physical Activity Goals  Sheina has been advised to work up to 150 minutes of moderate intensity aerobic activity a week and strengthening exercises 2-3 times per week for  cardiovascular health, weight loss maintenance and preservation of muscle mass.   She has agreed to Think about enjoyable ways to increase daily physical activity and overcoming barriers to exercise, Increase physical activity in their day and reduce sedentary time (increase NEAT)., Increase the intensity, frequency or duration of strengthening exercises , and Increase the intensity, frequency or duration of aerobic exercises       ASSOCIATED CONDITIONS ADDRESSED TODAY  Action/Plan  Other hyperlipidemia -     Lipid Panel With LDL/HDL Ratio  Cardiovascular risk and specific lipid/LDL goals reviewed.  We discussed several  lifestyle modifications today and Ellin will continue to work on diet, exercise and weight loss efforts. Orders and follow up as documented in patient record.   Counseling Intensive lifestyle modifications are the first line treatment for this issue. Dietary changes: Increase soluble fiber. Decrease simple carbohydrates. Exercise changes: Moderate to vigorous-intensity aerobic activity 150 minutes per week if tolerated. Lipid-lowering medications: see documented in medical record.   Prediabetes She recently had an A1c checked at her PCPs office.  I am curious as to why her A1c increased especially with her dietary changes and her weight loss.  I am going to have her track her macros and will review at her next visit.  Discussed starting metformin.  Will hold off that at this time and continue to monitor  History of anemia -     CBC with Differential/Platelet  Medication management -     Comprehensive metabolic panel   Generalized obesity  BMI 28.0-28.9,adult          Return in about 4 weeks (around 01/01/2024).SABRA She was informed of the importance of frequent follow up visits to maximize her success with intensive lifestyle modifications for her multiple health conditions.   ATTESTASTION STATEMENTS:  Reviewed by clinician on day of visit: allergies,  medications, problem list, medical history, surgical history, family history, social history, and previous encounter notes.   Time spent on visit including pre-visit chart review and post-visit care and charting was 30 minutes.    Corean SAUNDERS. Dicky Boer FNP-C

## 2023-12-05 ENCOUNTER — Other Ambulatory Visit: Payer: Self-pay

## 2023-12-05 ENCOUNTER — Ambulatory Visit: Payer: Commercial Managed Care - PPO | Attending: Sports Medicine | Admitting: Physical Therapy

## 2023-12-05 DIAGNOSIS — M6281 Muscle weakness (generalized): Secondary | ICD-10-CM | POA: Diagnosis not present

## 2023-12-05 DIAGNOSIS — M25562 Pain in left knee: Secondary | ICD-10-CM | POA: Insufficient documentation

## 2023-12-05 DIAGNOSIS — X58XXXD Exposure to other specified factors, subsequent encounter: Secondary | ICD-10-CM | POA: Diagnosis not present

## 2023-12-05 DIAGNOSIS — S83207D Unspecified tear of unspecified meniscus, current injury, left knee, subsequent encounter: Secondary | ICD-10-CM | POA: Diagnosis not present

## 2023-12-05 DIAGNOSIS — R2689 Other abnormalities of gait and mobility: Secondary | ICD-10-CM | POA: Diagnosis not present

## 2023-12-05 LAB — COMPREHENSIVE METABOLIC PANEL
ALT: 14 [IU]/L (ref 0–32)
AST: 19 [IU]/L (ref 0–40)
Albumin: 4.1 g/dL (ref 3.9–4.9)
Alkaline Phosphatase: 72 [IU]/L (ref 44–121)
BUN/Creatinine Ratio: 37 — ABNORMAL HIGH (ref 12–28)
BUN: 26 mg/dL (ref 8–27)
Bilirubin Total: <0.2 mg/dL (ref 0.0–1.2)
CO2: 25 mmol/L (ref 20–29)
Calcium: 9.2 mg/dL (ref 8.7–10.3)
Chloride: 104 mmol/L (ref 96–106)
Creatinine, Ser: 0.71 mg/dL (ref 0.57–1.00)
Globulin, Total: 2.4 g/dL (ref 1.5–4.5)
Glucose: 86 mg/dL (ref 70–99)
Potassium: 4.9 mmol/L (ref 3.5–5.2)
Sodium: 141 mmol/L (ref 134–144)
Total Protein: 6.5 g/dL (ref 6.0–8.5)
eGFR: 97 mL/min/{1.73_m2} (ref >59)

## 2023-12-05 LAB — CBC WITH DIFFERENTIAL/PLATELET
Basophils Absolute: 0.1 10*3/uL (ref 0.0–0.2)
Basos: 1 %
EOS (ABSOLUTE): 0.2 10*3/uL (ref 0.0–0.4)
Eos: 5 %
Hematocrit: 39.6 % (ref 34.0–46.6)
Hemoglobin: 12.9 g/dL (ref 11.1–15.9)
Immature Grans (Abs): 0 10*3/uL (ref 0.0–0.1)
Immature Granulocytes: 0 %
Lymphocytes Absolute: 1.8 10*3/uL (ref 0.7–3.1)
Lymphs: 35 %
MCH: 31.5 pg (ref 26.6–33.0)
MCHC: 32.6 g/dL (ref 31.5–35.7)
MCV: 97 fL (ref 79–97)
Monocytes Absolute: 0.6 10*3/uL (ref 0.1–0.9)
Monocytes: 11 %
Neutrophils Absolute: 2.5 10*3/uL (ref 1.4–7.0)
Neutrophils: 48 %
Platelets: 272 10*3/uL (ref 150–450)
RBC: 4.1 x10E6/uL (ref 3.77–5.28)
RDW: 12.5 % (ref 11.7–15.4)
WBC: 5.2 10*3/uL (ref 3.4–10.8)

## 2023-12-05 LAB — LIPID PANEL WITH LDL/HDL RATIO
Cholesterol, Total: 190 mg/dL (ref 100–199)
HDL: 64 mg/dL (ref >39)
LDL Chol Calc (NIH): 112 mg/dL — ABNORMAL HIGH (ref 0–99)
LDL/HDL Ratio: 1.8 {ratio} (ref 0.0–3.2)
Triglycerides: 77 mg/dL (ref 0–149)
VLDL Cholesterol Cal: 14 mg/dL (ref 5–40)

## 2023-12-05 NOTE — Therapy (Signed)
 OUTPATIENT PHYSICAL THERAPY LOWER EXTREMITY EVALUATION   Patient Name: Danielle Braun MRN: [id number] DOB:1961/07/30, 63 y.o., female Today's Date: 12/05/2023  END OF SESSION:  PT End of Session - 12/05/23 0851     Visit Number 1    Number of Visits 12    Date for PT Re-Evaluation 01/16/24    Authorization Type [employer]    PT Start Time [phone]    PT Stop Time 0930    PT Time Calculation (min) 39 min             Past Medical History:  Diagnosis Date   Acne    Arrhythmia    Bursitis, subacromial    Left   Endometriosis    GERD (gastroesophageal reflux disease)    Joint pain    Palpitations    Retinal tear    Sinusitis    SOB (shortness of breath)    Vitamin D  deficiency    Past Surgical History:  Procedure Laterality Date   ABDOMINAL HYSTERECTOMY     ABDOMINAL SURGERY     csection   DILATION AND CURETTAGE OF UTERUS     SHOULDER ARTHROSCOPY WITH SUBACROMIAL DECOMPRESSION Left 10/31/2017   Procedure: SHOULDER ARTHROSCOPY WITH BURSECTOMY AND SUBACROMIAL DECOMPRESSION;  Surgeon: Dozier Soulier, MD;  Location: [facility] OR;  Service: Orthopedics;  Laterality: Left;  Left shoulder arthroscopy bursectomy and subacromial decompression   TONSILLECTOMY     Patient Active Problem List   Diagnosis Date Noted   Traumatic tear of meniscus of left knee 11/15/2023   Prediabetes 04/17/2023   History of colonic polyps 04/17/2023   Other fatigue 04/16/2023   SOB (shortness of breath) on exertion 04/16/2023   Health care maintenance 04/16/2023   Family history of diabetes mellitus 04/16/2023   Depression screening 04/16/2023   Generalized obesity 04/16/2023   BMI 30.0-30.9,adult 04/16/2023   Seasonal allergies 11/07/2022   Family history of osteoporosis 11/06/2022   Postmenopause 11/06/2022   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   Obesity (BMI 30-39.9) 01/26/2022   Patellofemoral pain syndrome of right knee 01/17/2022   Freiberg's disease, right 11/09/2021    Age-related osteoporosis without current pathological fracture 11/01/2021   Chronic sinusitis, unspecified 11/01/2021   Retinal tear of left eye 07/05/2021   History of laparoscopic-assisted vaginal hysterectomy 03/31/2021   History of sacrocolpopexy 03/31/2021   Blepharitis of upper and lower eyelids of both eyes 08/23/2020   Dry eye syndrome of both lacrimal glands 08/23/2020   Nuclear sclerotic cataract of both eyes 08/23/2020   Primary refluxing megaureter 03/02/2020   Sensation of feeling hot 01/18/2020   Chronic left shoulder pain 11/11/2018   Vitamin D  insufficiency 11/11/2018   Low bone mass 09/26/2017   Immune to measles 02/20/2016   Allergic rhinitis 02/06/2016   GERD (gastroesophageal reflux disease) 02/06/2016   Hyperlipemia 02/06/2016   Keratoconjunctivitis sicca of both eyes not specified as Sjogren's 02/06/2016   Myopia of both eyes with astigmatism 02/06/2016   Posterior vitreous detachment of left eye 02/06/2016   Premature atrial contraction 02/06/2016   History of right salpingo-oophorectomy 03/14/2010    PCP: Millicent Sharper, MD  REFERRING PROVIDER: Curtis Debby PARAS, MD  REFERRING DIAG: [phone] (ICD-10-CM) - Traumatic tear of meniscus of left knee, subsequent encounter  THERAPY DIAG:  Acute pain of left knee  Muscle weakness (generalized)  Other abnormalities of gait and mobility  Rationale for Evaluation and Treatment: Rehabilitation  ONSET DATE: ~3 weeks ago  SUBJECTIVE:   SUBJECTIVE STATEMENT: Pt states she's had  issues with both legs in the last 2 years. Pt was having L hip and leg pain in December. Pt was walking and her knee gave way ~3 weeks ago. Pt states she has been able to do her 12 hour shifts and be careful. Pt reports pain was not so bad she needed an injection.   PERTINENT HISTORY: Frieburgs disease R foot (AVN of metatarsal), OA R knee   PAIN:  Are you having pain? Yes: NPRS scale: 3 at rest, 5 at worst Pain location:  Medial L knee Pain description: Aching, can be sharp Aggravating factors: end of 12 hour shift, twisting Relieving factors: tylenol , ice and knee brace  PRECAUTIONS: None  RED FLAGS: None   WEIGHT BEARING RESTRICTIONS: No  FALLS:  Has patient fallen in last 6 months? No  LIVING ENVIRONMENT: Lives with: lives with their spouse Lives in: House/apartment Stairs: Yes: External: 2 steps; none Has following equipment at home: None  OCCUPATION: ICU nurse at [employer]  PLOF: Independent  PATIENT GOALS: Return to normal activities (pt bikes and rows x20 min each)  NEXT MD VISIT: As needed after PT  OBJECTIVE:  Note: Objective measures were completed at Evaluation unless otherwise noted.  DIAGNOSTIC FINDINGS: MRI 11/19/23 L knee IMPRESSION: 1. Complete tear at the root of the posterior horn of the medial meniscus, with near absence of meniscal tissue within the 3 mm of the posterior horn closest to the root. Associated mild extrusion of the body of the medial meniscus. 2. Tricompartmental cartilage degenerative changes, severe within the lateral patellofemoral cartilage. 3. Small joint effusion.  PATIENT SURVEYS:  Lower Extremity Functional Score: 47 / 80 = 58.8 %  COGNITION: Overall cognitive status: Within functional limits for tasks assessed     SENSATION: WFL  EDEMA:  A little edema  MUSCLE LENGTH: Hamstrings: Left slightly tighter than Right Thomas test: Did not assess  POSTURE: No Significant postural limitations  PALPATION: Mostly tender about medial L knee joint line and along edema around medial patella  LOWER EXTREMITY ROM:  Active ROM Right eval Left eval  Hip flexion    Hip extension    Hip abduction    Hip adduction    Hip internal rotation    Hip external rotation    Knee flexion 125 125  Knee extension 0 -5  Ankle dorsiflexion    Ankle plantarflexion    Ankle inversion    Ankle eversion     (Blank rows = not tested)  LOWER  EXTREMITY MMT:  MMT Right eval Left eval  Hip flexion 5 4*  Hip extension 4 3-  Hip abduction 5 3+  Hip adduction 5 3+  Hip internal rotation    Hip external rotation    Knee flexion 5 5  Knee extension 5 5  Ankle dorsiflexion    Ankle plantarflexion    Ankle inversion    Ankle eversion     (Blank rows = not tested)  LOWER EXTREMITY SPECIAL TESTS:  Hip special tests: Belvie (FABER) test: negative and Craig's test: negative  FUNCTIONAL TESTS:  5x STS: 15.92 sec SLS: R 20 sec, L 19 sec  GAIT: Distance walked: Into clinic Assistive device utilized: L knee brace Level of assistance: Complete Independence Comments: Mildly antalgic with L knee slightly kept in flexion  TREATMENT DATE: 12/05/23 See HEP below    PATIENT EDUCATION:  Education details: Exam findings, POC, initial HEP Person educated: Patient Education method: Explanation, Demonstration, and Handouts Education comprehension: verbalized understanding, returned demonstration, and needs further education  HOME EXERCISE PROGRAM: Access Code: [email or web address] URL: [email or web address] Date: 12/05/2023 Prepared by: Shannel Zahm April Earnie Starring  Exercises - Straight Leg Raise with External Rotation  - 1 x daily - 7 x weekly - 2 sets - 10 reps - Sidelying Hip Abduction  - 1 x daily - 7 x weekly - 2 sets - 10 reps - Prone Hip Extension  - 1 x daily - 7 x weekly - 2 sets - 10 reps - Supine Bridge with Mini Swiss Ball Between Knees  - 1 x daily - 7 x weekly - 2 sets - 10 reps  ASSESSMENT:  CLINICAL IMPRESSION: Patient is a 63 y.o. F who was seen today for physical therapy evaluation and treatment for L knee meniscus tear. PMH significant for Frieburg's on R foot. Assessment demos generally good L knee quad strength; however, decreased lateral and medial knee stability with her pain most  notable during twisting/rotary motions. Pt demos decreased hip adductor, abd and glute/extensor strength affecting her tolerance for prolonged standing and walking tasks. Pt will benefit from PT to address these deficits for return to normal function.   OBJECTIVE IMPAIRMENTS: Abnormal gait, decreased activity tolerance, decreased endurance, decreased mobility, difficulty walking, decreased ROM, decreased strength, increased edema, increased fascial restrictions, improper body mechanics, and pain.   ACTIVITY LIMITATIONS: bending, standing, squatting, stairs, and locomotion level  PARTICIPATION LIMITATIONS: cleaning, laundry, shopping, community activity, and occupation  PERSONAL FACTORS: Age, Fitness, Past/current experiences, and Time since onset of injury/illness/exacerbation are also affecting patient's functional outcome.   REHAB POTENTIAL: Good  CLINICAL DECISION MAKING: Evolving/moderate complexity  EVALUATION COMPLEXITY: Moderate   GOALS: Goals reviewed with patient? Yes  SHORT TERM GOALS: Target date: 12/26/2023  Pt will be ind with initial HEP Baseline: Goal status: INITIAL  2.  Pt will demo L = R knee ROM Baseline:  Goal status: INITIAL   LONG TERM GOALS: Target date: 01/16/2024   Pt will be ind with management and progression of HEP for full return to her normal gym activities (I.e. rowing and biking) Baseline:  Goal status: INITIAL  2.  Pt will report >/=75% improvement in her overall knee pain with activity Baseline:  Goal status: INITIAL  3.  Pt will demo improved 5x STS to </=10 sec for increased functional LE strength Baseline:  Goal status: INITIAL  4.  Pt will demo at least 4+/5 hip abductor and adductor strength for improved knee stability Baseline:  Goal status: INITIAL  5.  Pt will have improved LEFS to >/=56/80 to demo MCID Baseline:  Goal status: INITIAL    PLAN:  PT FREQUENCY: 2x/week  PT DURATION: 6 weeks  PLANNED INTERVENTIONS:  97164- PT Re-evaluation, 97110-Therapeutic exercises, 97530- Therapeutic activity, 97112- Neuromuscular re-education, 97535- Self Care, 02859- Manual therapy, [id number]- Gait training, [phone]- Aquatic Therapy, 97014- Electrical stimulation (unattended), 97016- Vasopneumatic device, [id number]- Ultrasound, 02966- Ionotophoresis 4mg /ml Dexamethasone , Patient/Family education, Balance training, Stair training, Taping, Dry Needling, Joint mobilization, Cryotherapy, and Moist heat  PLAN FOR NEXT SESSION: Assess response to HEP. Continue hip strengthening and knee strengthening for medial/lateral knee stability.    Jahnavi Muratore April Ma L Jaide Hillenburg, PT 12/05/2023, 10:26 AM

## 2023-12-08 ENCOUNTER — Other Ambulatory Visit: Payer: Self-pay

## 2023-12-10 ENCOUNTER — Ambulatory Visit: Payer: Commercial Managed Care - PPO | Admitting: Physical Therapy

## 2023-12-10 DIAGNOSIS — M25562 Pain in left knee: Secondary | ICD-10-CM

## 2023-12-10 DIAGNOSIS — M6281 Muscle weakness (generalized): Secondary | ICD-10-CM

## 2023-12-10 DIAGNOSIS — R2689 Other abnormalities of gait and mobility: Secondary | ICD-10-CM | POA: Diagnosis not present

## 2023-12-10 DIAGNOSIS — S83207D Unspecified tear of unspecified meniscus, current injury, left knee, subsequent encounter: Secondary | ICD-10-CM | POA: Diagnosis not present

## 2023-12-10 NOTE — Therapy (Signed)
OUTPATIENT PHYSICAL THERAPY TREATMENT   Patient Name: Danielle Braun MRN: [id number] DOB:[birth year], 62 y.o., female Today's Date: 12/10/2023  END OF SESSION:  PT End of Session - 12/10/23 1140     Visit Number 2    Number of Visits 12    Date for PT Re-Evaluation 01/16/24    Authorization Type [facility] [employer]    PT Start Time 1145    PT Stop Time 1225    PT Time Calculation (min) 40 min    Activity Tolerance Patient tolerated treatment well    Behavior During Therapy WFL for tasks assessed/performed              Past Medical History:  Diagnosis Date   Acne    Arrhythmia    Bursitis, subacromial    Left   Endometriosis    GERD (gastroesophageal reflux disease)    Joint pain    Palpitations    Retinal tear    Sinusitis    SOB (shortness of breath)    Vitamin D deficiency    Past Surgical History:  Procedure Laterality Date   ABDOMINAL HYSTERECTOMY     ABDOMINAL SURGERY     csection   DILATION AND CURETTAGE OF UTERUS     SHOULDER ARTHROSCOPY WITH SUBACROMIAL DECOMPRESSION Left 10/31/2017   Procedure: SHOULDER ARTHROSCOPY WITH BURSECTOMY AND SUBACROMIAL DECOMPRESSION;  Surgeon: Jones Broom, MD;  Location: [facility] OR;  Service: Orthopedics;  Laterality: Left;  Left shoulder arthroscopy bursectomy and subacromial decompression   TONSILLECTOMY     Patient Active Problem List   Diagnosis Date Noted   Traumatic tear of meniscus of left knee 11/15/2023   Prediabetes 04/17/2023   History of colonic polyps 04/17/2023   Other fatigue 04/16/2023   SOB (shortness of breath) on exertion 04/16/2023   Health care maintenance 04/16/2023   Family history of diabetes mellitus 04/16/2023   Depression screening 04/16/2023   Generalized obesity 04/16/2023   BMI 30.0-30.9,adult 04/16/2023   Seasonal allergies 11/07/2022   Family history of osteoporosis 11/06/2022   Postmenopause 11/06/2022   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   Obesity (BMI 30-39.9) 01/26/2022    Patellofemoral pain syndrome of right knee 01/17/2022   Freiberg's disease, right 11/09/2021   Age-related osteoporosis without current pathological fracture 11/01/2021   Chronic sinusitis, unspecified 11/01/2021   Retinal tear of left eye 07/05/2021   History of laparoscopic-assisted vaginal hysterectomy 03/31/2021   History of sacrocolpopexy 03/31/2021   Blepharitis of upper and lower eyelids of both eyes 08/23/2020   Dry eye syndrome of both lacrimal glands 08/23/2020   Nuclear sclerotic cataract of both eyes 08/23/2020   Primary refluxing megaureter 03/02/2020   Sensation of feeling hot 01/18/2020   Chronic left shoulder pain 11/11/2018   Vitamin D insufficiency 11/11/2018   Low bone mass 09/26/2017   Immune to measles 02/20/2016   Allergic rhinitis 02/06/2016   GERD (gastroesophageal reflux disease) 02/06/2016   Hyperlipemia 02/06/2016   Keratoconjunctivitis sicca of both eyes not specified as Sjogren's 02/06/2016   Myopia of both eyes with astigmatism 02/06/2016   Posterior vitreous detachment of left eye 02/06/2016   Premature atrial contraction 02/06/2016   History of right salpingo-oophorectomy 03/14/2010    PCP: Loyal Jacobson, MD  REFERRING PROVIDER: Monica Becton, MD  REFERRING DIAG: S83.207D (ICD-10-CM) - Traumatic tear of meniscus of left knee, subsequent encounter  THERAPY DIAG:  Acute pain of left knee  Muscle weakness (generalized)  Other abnormalities of gait and mobility  Rationale for Evaluation and  Treatment: Rehabilitation  ONSET DATE: ~3 weeks ago  SUBJECTIVE:   SUBJECTIVE STATEMENT: Pt states it's been going good with the exercises. She worked last night so she felt it at the end of the night.   From eval: Pt states she's had issues with both legs in the last 2 years. Pt was having L hip and leg pain in December. Pt was walking and her knee gave way ~3 weeks ago. Pt states she has been able to do her 12 hour shifts and be careful. Pt  reports pain was not so bad she needed an injection.   PERTINENT HISTORY: Frieburgs disease R foot (AVN of metatarsal), OA R knee   PAIN:  Are you having pain? Yes: NPRS scale: 3 at rest, 5 at worst Pain location: Medial L knee Pain description: Aching, can be sharp Aggravating factors: end of 12 hour shift, twisting Relieving factors: tylenol, ice and knee brace  PRECAUTIONS: None  RED FLAGS: None   WEIGHT BEARING RESTRICTIONS: No  FALLS:  Has patient fallen in last 6 months? No  LIVING ENVIRONMENT: Lives with: lives with their spouse Lives in: House/apartment Stairs: Yes: External: 2 steps; none Has following equipment at home: None  OCCUPATION: ICU nurse at [employer]  PLOF: Independent  PATIENT GOALS: Return to normal activities (pt bikes and rows x20 min each)  NEXT MD VISIT: As needed after PT  OBJECTIVE:  Note: Objective measures were completed at Evaluation unless otherwise noted.  DIAGNOSTIC FINDINGS: MRI 11/19/23 L knee IMPRESSION: 1. Complete tear at the root of the posterior horn of the medial meniscus, with near absence of meniscal tissue within the 3 mm of the posterior horn closest to the root. Associated mild extrusion of the body of the medial meniscus. 2. Tricompartmental cartilage degenerative changes, severe within the lateral patellofemoral cartilage. 3. Small joint effusion.  PATIENT SURVEYS:  Lower Extremity Functional Score: 47 / 80 = 58.8 %  EDEMA:  "A little edema"  MUSCLE LENGTH: Hamstrings: Left slightly tighter than Right Thomas test: Did not assess  PALPATION: Mostly tender about medial L knee joint line and along edema around medial patella  LOWER EXTREMITY ROM:  Active ROM Right eval Left eval  Hip flexion    Hip extension    Hip abduction    Hip adduction    Hip internal rotation    Hip external rotation    Knee flexion 125 125  Knee extension 0 -5  Ankle dorsiflexion    Ankle plantarflexion    Ankle  inversion    Ankle eversion     (Blank rows = not tested)  LOWER EXTREMITY MMT:  MMT Right eval Left eval  Hip flexion 5 4*  Hip extension 4 3-  Hip abduction 5 3+  Hip adduction 5 3+  Hip internal rotation    Hip external rotation    Knee flexion 5 5  Knee extension 5 5  Ankle dorsiflexion    Ankle plantarflexion    Ankle inversion    Ankle eversion     (Blank rows = not tested, * = pain)  LOWER EXTREMITY SPECIAL TESTS:  Hip special tests: Luisa Hart (FABER) test: negative and Craig's test: negative  FUNCTIONAL TESTS:  5x STS: 15.92 sec SLS: R 20 sec, L 19 sec  GAIT: Distance walked: Into clinic Assistive device utilized: L knee brace Level of assistance: Complete Independence Comments: Mildly antalgic with L knee slightly kept in flexion   [facility] Adult PT Treatment:  DATE: 12/10/23 Therapeutic Exercise: Nustep L5 x 5 min LEs Prone quad stretch with strap 3x30" Neuromuscular re-ed: Prone quad set 2x10 Prone hip ext with knee flexion 2x10 Hip IR red TB 2x10 Hip ER red TB 2x10 SLR with hip ER 2x10 Therapeutic Activity: Bridge with ball squeeze 2x10 Fwd step ups 4" step 2x10  Compensatory strategies for coming down stairs without hurting L knee Side step downs on L LE                                                                                                                                TREATMENT DATE:  12/05/23 See HEP below    PATIENT EDUCATION:  Education details: Exam findings, POC, initial HEP Person educated: Patient Education method: Explanation, Demonstration, and Handouts Education comprehension: verbalized understanding, returned demonstration, and needs further education  HOME EXERCISE PROGRAM: Access Code: [email or web address] URL: [email or web address] Date: 12/05/2023 Prepared by: Vernon Prey April Kirstie Peri  Exercises - Straight Leg Raise with External Rotation  - 1 x daily - 7 x weekly -  2 sets - 10 reps - Sidelying Hip Abduction  - 1 x daily - 7 x weekly - 2 sets - 10 reps - Prone Hip Extension  - 1 x daily - 7 x weekly - 2 sets - 10 reps - Supine Bridge with Mini Swiss Ball Between Knees  - 1 x daily - 7 x weekly - 2 sets - 10 reps  ASSESSMENT:  CLINICAL IMPRESSION: Worked on continuing to improve pt's knee/hip strength especially for lateral and medial knee stability. Discussed, reviewed and practiced methods of going up/down stairs without increasing her knee pain with good pt understanding.   From eval: Patient is a 63 y.o. F who was seen today for physical therapy evaluation and treatment for L knee meniscus tear. PMH significant for Frieburg's on R foot. Assessment demos generally good L knee quad strength; however, decreased lateral and medial knee stability with her pain most notable during twisting/rotary motions. Pt demos decreased hip adductor, abd and glute/extensor strength affecting her tolerance for prolonged standing and walking tasks. Pt will benefit from PT to address these deficits for return to normal function.   OBJECTIVE IMPAIRMENTS: Abnormal gait, decreased activity tolerance, decreased endurance, decreased mobility, difficulty walking, decreased ROM, decreased strength, increased edema, increased fascial restrictions, improper body mechanics, and pain.     GOALS: Goals reviewed with patient? Yes  SHORT TERM GOALS: Target date: 12/26/2023  Pt will be ind with initial HEP Baseline: Goal status: INITIAL  2.  Pt will demo L = R knee ROM Baseline:  Goal status: INITIAL   LONG TERM GOALS: Target date: 01/16/2024   Pt will be ind with management and progression of HEP for full return to her normal gym activities (I.e. rowing and biking) Baseline:  Goal status: INITIAL  2.  Pt will report >/=75% improvement in her overall knee pain with activity Baseline:  Goal status: INITIAL  3.  Pt will demo improved 5x STS to </=10 sec for increased  functional LE strength Baseline:  Goal status: INITIAL  4.  Pt will demo at least 4+/5 hip abductor and adductor strength for improved knee stability Baseline:  Goal status: INITIAL  5.  Pt will have improved LEFS to >/=56/80 to demo MCID Baseline:  Goal status: INITIAL    PLAN:  PT FREQUENCY: 2x/week  PT DURATION: 6 weeks  PLANNED INTERVENTIONS: 97164- PT Re-evaluation, 97110-Therapeutic exercises, 97530- Therapeutic activity, [id number]- Neuromuscular re-education, 97535- Self Care, 16109- Manual therapy, [id number]- Gait training, [phone]- Aquatic Therapy, 97014- Electrical stimulation (unattended), 97016- Vasopneumatic device, [id number]- Ultrasound, 09811- Ionotophoresis 4mg /ml Dexamethasone, Patient/Family education, Balance training, Stair training, Taping, Dry Needling, Joint mobilization, Cryotherapy, and Moist heat  PLAN FOR NEXT SESSION: Progress HEP. Continue hip strengthening and knee strengthening for medial/lateral knee stability.    Pinkney Venard April Ma L Basilia Stuckert, PT 12/10/2023, 11:41 AM

## 2023-12-12 ENCOUNTER — Ambulatory Visit: Payer: Commercial Managed Care - PPO

## 2023-12-12 DIAGNOSIS — S83207D Unspecified tear of unspecified meniscus, current injury, left knee, subsequent encounter: Secondary | ICD-10-CM | POA: Diagnosis not present

## 2023-12-12 DIAGNOSIS — M6281 Muscle weakness (generalized): Secondary | ICD-10-CM

## 2023-12-12 DIAGNOSIS — R2689 Other abnormalities of gait and mobility: Secondary | ICD-10-CM | POA: Diagnosis not present

## 2023-12-12 DIAGNOSIS — M25562 Pain in left knee: Secondary | ICD-10-CM

## 2023-12-12 NOTE — Therapy (Signed)
OUTPATIENT PHYSICAL THERAPY TREATMENT   Patient Name: Danielle Braun MRN: [id number] DOB:[birth year], 63 y.o., female Today's Date: 12/12/2023  END OF SESSION:  PT End of Session - 12/12/23 0845     Visit Number 3    Number of Visits 12    Date for PT Re-Evaluation 01/16/24    Authorization Type [facility] [employer]    PT Start Time [phone]    PT Stop Time 0925    PT Time Calculation (min) 40 min    Activity Tolerance Patient tolerated treatment well    Behavior During Therapy WFL for tasks assessed/performed               Past Medical History:  Diagnosis Date   Acne    Arrhythmia    Bursitis, subacromial    Left   Endometriosis    GERD (gastroesophageal reflux disease)    Joint pain    Palpitations    Retinal tear    Sinusitis    SOB (shortness of breath)    Vitamin D deficiency    Past Surgical History:  Procedure Laterality Date   ABDOMINAL HYSTERECTOMY     ABDOMINAL SURGERY     csection   DILATION AND CURETTAGE OF UTERUS     SHOULDER ARTHROSCOPY WITH SUBACROMIAL DECOMPRESSION Left 10/31/2017   Procedure: SHOULDER ARTHROSCOPY WITH BURSECTOMY AND SUBACROMIAL DECOMPRESSION;  Surgeon: Jones Broom, MD;  Location: [facility] OR;  Service: Orthopedics;  Laterality: Left;  Left shoulder arthroscopy bursectomy and subacromial decompression   TONSILLECTOMY     Patient Active Problem List   Diagnosis Date Noted   Traumatic tear of meniscus of left knee 11/15/2023   Prediabetes 04/17/2023   History of colonic polyps 04/17/2023   Other fatigue 04/16/2023   SOB (shortness of breath) on exertion 04/16/2023   Health care maintenance 04/16/2023   Family history of diabetes mellitus 04/16/2023   Depression screening 04/16/2023   Generalized obesity 04/16/2023   BMI 30.0-30.9,adult 04/16/2023   Seasonal allergies 11/07/2022   Family history of osteoporosis 11/06/2022   Postmenopause 11/06/2022   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   Obesity (BMI 30-39.9) 01/26/2022    Patellofemoral pain syndrome of right knee 01/17/2022   Freiberg's disease, right 11/09/2021   Age-related osteoporosis without current pathological fracture 11/01/2021   Chronic sinusitis, unspecified 11/01/2021   Retinal tear of left eye 07/05/2021   History of laparoscopic-assisted vaginal hysterectomy 03/31/2021   History of sacrocolpopexy 03/31/2021   Blepharitis of upper and lower eyelids of both eyes 08/23/2020   Dry eye syndrome of both lacrimal glands 08/23/2020   Nuclear sclerotic cataract of both eyes 08/23/2020   Primary refluxing megaureter 03/02/2020   Sensation of feeling hot 01/18/2020   Chronic left shoulder pain 11/11/2018   Vitamin D insufficiency 11/11/2018   Low bone mass 09/26/2017   Immune to measles 02/20/2016   Allergic rhinitis 02/06/2016   GERD (gastroesophageal reflux disease) 02/06/2016   Hyperlipemia 02/06/2016   Keratoconjunctivitis sicca of both eyes not specified as Sjogren's 02/06/2016   Myopia of both eyes with astigmatism 02/06/2016   Posterior vitreous detachment of left eye 02/06/2016   Premature atrial contraction 02/06/2016   History of right salpingo-oophorectomy 03/14/2010    PCP: Loyal Jacobson, MD  REFERRING PROVIDER: Monica Becton, MD  REFERRING DIAG: S83.207D (ICD-10-CM) - Traumatic tear of meniscus of left knee, subsequent encounter  THERAPY DIAG:  Acute pain of left knee  Muscle weakness (generalized)  Other abnormalities of gait and mobility  Rationale for Evaluation  and Treatment: Rehabilitation  ONSET DATE: ~3 weeks ago  SUBJECTIVE:   SUBJECTIVE STATEMENT: Patient just got off of work and is surprised that the knee is doing ok.   From eval: Pt states she's had issues with both legs in the last 2 years. Pt was having L hip and leg pain in December. Pt was walking and her knee gave way ~3 weeks ago. Pt states she has been able to do her 12 hour shifts and be careful. Pt reports pain was not so bad she needed  an injection.   PERTINENT HISTORY: Frieburgs disease R foot (AVN of metatarsal), OA R knee   PAIN:  Are you having pain? Yes: NPRS scale: none currently; at worst 4 Pain location: Medial L knee Pain description: Aching, can be sharp Aggravating factors: end of 12 hour shift, twisting Relieving factors: tylenol, ice and knee brace  PRECAUTIONS: None  RED FLAGS: None   WEIGHT BEARING RESTRICTIONS: No  FALLS:  Has patient fallen in last 6 months? No  LIVING ENVIRONMENT: Lives with: lives with their spouse Lives in: House/apartment Stairs: Yes: External: 2 steps; none Has following equipment at home: None  OCCUPATION: ICU nurse at [employer]  PLOF: Independent  PATIENT GOALS: Return to normal activities (pt bikes and rows x20 min each)  NEXT MD VISIT: As needed after PT  OBJECTIVE:  Note: Objective measures were completed at Evaluation unless otherwise noted.  DIAGNOSTIC FINDINGS: MRI 11/19/23 L knee IMPRESSION: 1. Complete tear at the root of the posterior horn of the medial meniscus, with near absence of meniscal tissue within the 3 mm of the posterior horn closest to the root. Associated mild extrusion of the body of the medial meniscus. 2. Tricompartmental cartilage degenerative changes, severe within the lateral patellofemoral cartilage. 3. Small joint effusion.  PATIENT SURVEYS:  Lower Extremity Functional Score: 47 / 80 = 58.8 %  EDEMA:  "A little edema"  MUSCLE LENGTH: Hamstrings: Left slightly tighter than Right Thomas test: Did not assess  PALPATION: Mostly tender about medial L knee joint line and along edema around medial patella  LOWER EXTREMITY ROM:  Active ROM Right eval Left eval  Hip flexion    Hip extension    Hip abduction    Hip adduction    Hip internal rotation    Hip external rotation    Knee flexion 125 125  Knee extension 0 -5  Ankle dorsiflexion    Ankle plantarflexion    Ankle inversion    Ankle eversion     (Blank  rows = not tested)  LOWER EXTREMITY MMT:  MMT Right eval Left eval  Hip flexion 5 4*  Hip extension 4 3-  Hip abduction 5 3+  Hip adduction 5 3+  Hip internal rotation    Hip external rotation    Knee flexion 5 5  Knee extension 5 5  Ankle dorsiflexion    Ankle plantarflexion    Ankle inversion    Ankle eversion     (Blank rows = not tested, * = pain)  LOWER EXTREMITY SPECIAL TESTS:  Hip special tests: Luisa Hart (FABER) test: negative and Craig's test: negative  FUNCTIONAL TESTS:  5x STS: 15.92 sec SLS: R 20 sec, L 19 sec  GAIT: Distance walked: Into clinic Assistive device utilized: L knee brace Level of assistance: Complete Independence Comments: Mildly antalgic with L knee slightly kept in flexion  [facility] Adult PT Treatment:  DATE: 12/12/23 Therapeutic Exercise: NuStep level 5 x 5 minutes UE/LE  HS curl 2 x 10 blue band  Updated HEP   Neuromuscular re-ed: LAQ 2 x 10 @ 3 lbs  Hip bridge with abduction blue band 2 x 10  Therapeutic Activity: Step up knee driver 6 inch 2 x 10  Leg press 2 x 10 @ 65 lbs    [facility] Adult PT Treatment:                                                DATE: 12/10/23 Therapeutic Exercise: Nustep L5 x 5 min LEs Prone quad stretch with strap 3x30" Neuromuscular re-ed: Prone quad set 2x10 Prone hip ext with knee flexion 2x10 Hip IR red TB 2x10 Hip ER red TB 2x10 SLR with hip ER 2x10 Therapeutic Activity: Bridge with ball squeeze 2x10 Fwd step ups 4" step 2x10  Compensatory strategies for coming down stairs without hurting L knee Side step downs on L LE                                                                                                                                TREATMENT DATE:  12/05/23 See HEP below    PATIENT EDUCATION:  Education details: HEP update  Person educated: Patient Education method: Explanation, Demonstration, and Handouts Education comprehension:  verbalized understanding, returned demonstration, and needs further education  HOME EXERCISE PROGRAM: Access Code: [email or web address] URL: [email or web address] Date: 12/12/2023 Prepared by: Letitia Libra  Exercises - Straight Leg Raise with External Rotation  - 1 x daily - 7 x weekly - 2 sets - 10 reps - Sidelying Hip Abduction  - 1 x daily - 7 x weekly - 2 sets - 10 reps - Prone Hip Extension  - 1 x daily - 7 x weekly - 2 sets - 10 reps - Runner's Step Up/Down  - 1 x daily - 7 x weekly - 2 sets - 10 reps - Bridge with Hip Abduction and Resistance  - 1 x daily - 7 x weekly - 2 sets - 10 reps  ASSESSMENT:  CLINICAL IMPRESSION: Patient arrives without reports of pain after just finishing work shift. Progressed LE strengthening focusing on quad strengthening with good tolerance. With step up knee driver she has initially difficulty maintaining stability during SLS on the LLE, but with continued reps her balance improves. No reports of knee instability throughout session, but does report occasional soreness in the knee. HEP was updated to include further strengthening.   From eval: Patient is a 63 y.o. F who was seen today for physical therapy evaluation and treatment for L knee meniscus tear. PMH significant for Frieburg's on R foot. Assessment demos generally good L knee quad strength; however, decreased lateral and medial knee stability with her pain most notable during twisting/rotary  motions. Pt demos decreased hip adductor, abd and glute/extensor strength affecting her tolerance for prolonged standing and walking tasks. Pt will benefit from PT to address these deficits for return to normal function.   OBJECTIVE IMPAIRMENTS: Abnormal gait, decreased activity tolerance, decreased endurance, decreased mobility, difficulty walking, decreased ROM, decreased strength, increased edema, increased fascial restrictions, improper body mechanics, and pain.     GOALS: Goals reviewed with  patient? Yes  SHORT TERM GOALS: Target date: 12/26/2023  Pt will be ind with initial HEP Baseline: Goal status: INITIAL  2.  Pt will demo L = R knee ROM Baseline:  Goal status: INITIAL   LONG TERM GOALS: Target date: 01/16/2024   Pt will be ind with management and progression of HEP for full return to her normal gym activities (I.e. rowing and biking) Baseline:  Goal status: INITIAL  2.  Pt will report >/=75% improvement in her overall knee pain with activity Baseline:  Goal status: INITIAL  3.  Pt will demo improved 5x STS to </=10 sec for increased functional LE strength Baseline:  Goal status: INITIAL  4.  Pt will demo at least 4+/5 hip abductor and adductor strength for improved knee stability Baseline:  Goal status: INITIAL  5.  Pt will have improved LEFS to >/=56/80 to demo MCID Baseline:  Goal status: INITIAL    PLAN:  PT FREQUENCY: 2x/week  PT DURATION: 6 weeks  PLANNED INTERVENTIONS: 97164- PT Re-evaluation, 97110-Therapeutic exercises, 97530- Therapeutic activity, 97112- Neuromuscular re-education, 97535- Self Care, 28413- Manual therapy, [id number]- Gait training, [phone]- Aquatic Therapy, 97014- Electrical stimulation (unattended), 97016- Vasopneumatic device, [id number]- Ultrasound, 02725- Ionotophoresis 4mg /ml Dexamethasone, Patient/Family education, Balance training, Stair training, Taping, Dry Needling, Joint mobilization, Cryotherapy, and Moist heat  PLAN FOR NEXT SESSION: Progress HEP. Continue hip strengthening and knee strengthening for medial/lateral knee stability.   Letitia Libra, PT, DPT, ATC 12/12/23 9:27 AM

## 2023-12-20 ENCOUNTER — Ambulatory Visit
Admission: RE | Admit: 2023-12-20 | Discharge: 2023-12-20 | Disposition: A | Discharge status: Home / Self Care | Payer: Commercial Managed Care - PPO | Source: Ambulatory Visit | Attending: Family Medicine | Admitting: Family Medicine

## 2023-12-20 VITALS — BP 174/84 | HR 108 | Temp 98.9°F | Resp 16

## 2023-12-20 DIAGNOSIS — U071 COVID-19: Secondary | ICD-10-CM

## 2023-12-20 DIAGNOSIS — R059 Cough, unspecified: Secondary | ICD-10-CM | POA: Diagnosis not present

## 2023-12-20 LAB — POC SARS CORONAVIRUS 2 AG -  ED: SARS Coronavirus 2 Ag: POSITIVE — AB

## 2023-12-20 LAB — POCT INFLUENZA A/B
Influenza A, POC: NEGATIVE
Influenza B, POC: NEGATIVE

## 2023-12-20 MED ORDER — PAXLOVID (300/100) 20 X 150 MG & 10 X 100MG PO TBPK: 3.0000 | ORAL_TABLET | Freq: Two times a day (BID) | ORAL | 0 refills | Status: AC

## 2023-12-20 MED ORDER — BENZONATATE 200 MG PO CAPS: 200.0000 mg | ORAL_CAPSULE | Freq: Three times a day (TID) | ORAL | 0 refills | Status: AC | PRN

## 2023-12-20 MED ORDER — HYDROCODONE BIT-HOMATROP MBR 5-1.5 MG/5ML PO SOLN: 5.0000 mL | Freq: Four times a day (QID) | ORAL | 0 refills | Status: DC | PRN

## 2023-12-20 NOTE — ED Triage Notes (Signed)
Pt presents to [facility] with co of cough and congestion. For 3 days Pt reports she was recently taking care of her grandson who was sick with similar symptoms. Reports she needs a covid test to return to work.

## 2023-12-20 NOTE — ED Provider Notes (Signed)
[facility]    CSN: [id number] Arrival date & time: 12/20/23  1833      History   Chief Complaint Chief Complaint  Patient presents with   Cough    [employer] employee need COVID test to return to work on Saturday - Entered by patient    HPI Danielle Braun is a 63 y.o. female.   HPI Pleasant 63 year old female presents with cough and congestion for 3 days.  Reports her grandson was sick with similar symptoms.  Reports that she needs a COVID and Influenza test to return to work.  Past Medical History:  Diagnosis Date   Acne    Arrhythmia    Bursitis, subacromial    Left   Endometriosis    GERD (gastroesophageal reflux disease)    Joint pain    Palpitations    Retinal tear    Sinusitis    SOB (shortness of breath)    Vitamin D deficiency     Patient Active Problem List   Diagnosis Date Noted   Traumatic tear of meniscus of left knee 11/15/2023   Prediabetes 04/17/2023   History of colonic polyps 04/17/2023   Other fatigue 04/16/2023   SOB (shortness of breath) on exertion 04/16/2023   Health care maintenance 04/16/2023   Family history of diabetes mellitus 04/16/2023   Depression screening 04/16/2023   Generalized obesity 04/16/2023   BMI 30.0-30.9,adult 04/16/2023   Seasonal allergies 11/07/2022   Family history of osteoporosis 11/06/2022   Postmenopause 11/06/2022   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   Obesity (BMI 30-39.9) 01/26/2022   Patellofemoral pain syndrome of right knee 01/17/2022   Freiberg's disease, right 11/09/2021   Age-related osteoporosis without current pathological fracture 11/01/2021   Chronic sinusitis, unspecified 11/01/2021   Retinal tear of left eye 07/05/2021   History of laparoscopic-assisted vaginal hysterectomy 03/31/2021   History of sacrocolpopexy 03/31/2021   Blepharitis of upper and lower eyelids of both eyes 08/23/2020   Dry eye syndrome of both lacrimal glands 08/23/2020   Nuclear sclerotic cataract of  both eyes 08/23/2020   Primary refluxing megaureter 03/02/2020   Sensation of feeling hot 01/18/2020   Chronic left shoulder pain 11/11/2018   Vitamin D insufficiency 11/11/2018   Low bone mass 09/26/2017   Immune to measles 02/20/2016   Allergic rhinitis 02/06/2016   GERD (gastroesophageal reflux disease) 02/06/2016   Hyperlipemia 02/06/2016   Keratoconjunctivitis sicca of both eyes not specified as Sjogren's 02/06/2016   Myopia of both eyes with astigmatism 02/06/2016   Posterior vitreous detachment of left eye 02/06/2016   Premature atrial contraction 02/06/2016   History of right salpingo-oophorectomy 03/14/2010    Past Surgical History:  Procedure Laterality Date   ABDOMINAL HYSTERECTOMY     ABDOMINAL SURGERY     csection   DILATION AND CURETTAGE OF UTERUS     SHOULDER ARTHROSCOPY WITH SUBACROMIAL DECOMPRESSION Left 10/31/2017   Procedure: SHOULDER ARTHROSCOPY WITH BURSECTOMY AND SUBACROMIAL DECOMPRESSION;  Surgeon: Jones Broom, MD;  Location: [facility] OR;  Service: Orthopedics;  Laterality: Left;  Left shoulder arthroscopy bursectomy and subacromial decompression   TONSILLECTOMY      OB History     Gravida  2   Para      Term      Preterm      AB      Living         SAB      IAB      Ectopic      Multiple  Live Births               Home Medications    Prior to Admission medications   Medication Sig Start Date End Date Taking? Authorizing Provider  benzonatate (TESSALON) 200 MG capsule Take 1 capsule (200 mg total) by mouth 3 (three) times daily as needed for up to 7 days. 12/20/23 12/27/23 Yes Trevor Iha, FNP  HYDROcodone bit-homatropine (HYCODAN) 5-1.5 MG/5ML syrup Take 5 mLs by mouth every 6 (six) hours as needed for cough. 12/20/23  Yes Trevor Iha, FNP  nirmatrelvir/ritonavir (PAXLOVID, 300/100,) 20 x 150 MG & 10 x 100MG  TBPK Take 3 tablets by mouth 2 (two) times daily for 5 days. Patient GFR is 97. Take nirmatrelvir (150 mg) two  tablets twice daily for 5 days and ritonavir (100 mg) one tablet twice daily for 5 days. 12/20/23 12/25/23 Yes Trevor Iha, FNP  acetaminophen (TYLENOL) 500 MG tablet Take 1,000 mg by mouth every 6 (six) hours as needed for moderate pain or headache.    [provider]  Calcium Citrate-Vitamin D 315-250 MG-UNIT TABS calcium citrate 315 mg calcium-vitamin D3 6.25 mcg (250 unit) tablet 09/27/17   [provider]  cefadroxil (DURICEF) 500 MG capsule Take 1 capsule (500 mg total) by mouth 2 (two) times daily. 09/16/23   Eustace Moore, MD  celecoxib (CELEBREX) 200 MG capsule One to 2 tablets by mouth daily as needed for pain. 11/15/23   Monica Becton, MD  Cholecalciferol (VITAMIN D3) 2000 units TABS Take 2,000 Units by mouth daily.    [provider]  cyclobenzaprine (FLEXERIL) 10 MG tablet One half to one tab PO qHS, then increase gradually to one tab TID. 11/15/23   Monica Becton, MD  diphenhydrAMINE (BENADRYL) 25 mg capsule Take 25 mg by mouth at bedtime as needed.    [provider]  estradiol (ESTRACE) 0.1 MG/GM vaginal cream Place 1 Applicatorful vaginally at bedtime.    [provider]  fexofenadine (ALLEGRA) 60 MG tablet Take 60 mg by mouth daily as needed.     [provider]  fluticasone (FLONASE) 50 MCG/ACT nasal spray USE 2 SPRAYS IN EACH NOSTRIL ONCE A DAY 11/20/23     ipratropium (ATROVENT) 0.03 % nasal spray Place 2 sprays into both nostrils every 12 (twelve) hours. 12/15/21   Roxy Horseman, PA-C  magnesium citrate SOLN Take 150 mL PO twice daily for 3 days 06/09/23   Trevor Iha, FNP  Minoxidil (ROGAINE MENS EXTRA STRENGTH) 5 % FOAM     [provider]  omeprazole (PRILOSEC) 20 MG capsule Take 1 capsule (20 mg total) by mouth daily. 04/25/23     traMADol (ULTRAM) 50 MG tablet Take 50 mg by mouth 2 (two) times daily as needed for moderate pain.    [provider]  venlafaxine XR (EFFEXOR-XR) 37.5  MG 24 hr capsule Take 1 capsule (37.5 mg total) by mouth daily. 10/29/23       Family History Family History  Problem Relation Age of Onset   Hyperlipidemia Mother    High blood pressure Mother    Depression Mother    Anxiety disorder Mother    Cancer Father        bladder   High blood pressure Father    High Cholesterol Father    Depression Father    Anxiety disorder Father    Diabetes Brother    Breast cancer Neg Hx     Social History Social History   Tobacco Use  Smoking status: Former    Types: Cigarettes   Smokeless tobacco: Never  Vaping Use   Vaping status: Never Used  Substance Use Topics   Alcohol use: Yes    Comment: occasional   Drug use: No     Allergies   Influenza virus vaccine   Review of Systems Review of Systems   Physical Exam Triage Vital Signs ED Triage Vitals  Encounter Vitals Group     BP 12/20/23 1907 (!) 174/84     Systolic BP Percentile --      Diastolic BP Percentile --      Pulse Rate 12/20/23 1907 (!) 108     Resp 12/20/23 1907 16     Temp 12/20/23 1907 98.9 F (37.2 C)     Temp src --      SpO2 12/20/23 1907 98 %     Weight --      Height --      Head Circumference --      Peak Flow --      Pain Score 12/20/23 1906 0     Pain Loc --      Pain Education --      Exclude from Growth Chart --    No data found.  Updated Vital Signs BP (!) 174/84   Pulse (!) 108   Temp 98.9 F (37.2 C)   Resp 16   SpO2 98%   Physical Exam Vitals and nursing note reviewed.  Constitutional:      General: She is not in acute distress.    Appearance: Normal appearance. She is obese. She is ill-appearing.  HENT:     Head: Normocephalic and atraumatic.     Right Ear: Tympanic membrane, ear canal and external ear normal.     Left Ear: Tympanic membrane, ear canal and external ear normal.     Mouth/Throat:     Mouth: Mucous membranes are moist.     Pharynx: Oropharynx is clear.  Eyes:     Conjunctiva/sclera: Conjunctivae normal.      Pupils: Pupils are equal, round, and reactive to light.  Cardiovascular:     Rate and Rhythm: Normal rate and regular rhythm.     Pulses: Normal pulses.     Heart sounds: Normal heart sounds.  Pulmonary:     Effort: Pulmonary effort is normal.     Breath sounds: Normal breath sounds. No wheezing, rhonchi or rales.     Comments: Infrequent nonproductive cough on exam Abdominal:     General: Bowel sounds are normal.  Musculoskeletal:        General: Normal range of motion.     Cervical back: Normal range of motion and neck supple.  Skin:    General: Skin is warm and dry.  Neurological:     General: No focal deficit present.     Mental Status: She is alert and oriented to person, place, and time. Mental status is at baseline.  Psychiatric:        Mood and Affect: Mood normal.        Behavior: Behavior normal.        Thought Content: Thought content normal.      UC Treatments / Results  Labs (all labs ordered are listed, but only abnormal results are displayed) Labs Reviewed  POC SARS CORONAVIRUS 2 AG -  ED - Abnormal; Notable for the following components:      Result Value   SARS Coronavirus 2 Ag Positive (*)  All other components within normal limits  POCT INFLUENZA A/B    EKG   Radiology No results found.  Procedures Procedures (including critical care time)  Medications Ordered in UC Medications - No data to display  Initial Impression / Assessment and Plan / UC Course  I have reviewed the triage vital signs and the nursing notes.  Pertinent labs & imaging results that were available during my care of the patient were reviewed by me and considered in my medical decision making (see chart for details).    MDM: 1.  COVID-19-Rx'd Paxlovid: Take 3 tablets by mouth twice daily x 5 days; 2.  Cough, unspecified-Rx'd Tessalon 200 mg capsules: Take 1 capsule 3 times daily, as needed for cough, Rx'd Hycodan cough syrup 5-1.5 mg / 5 mL syrup: Take 5 mL every 6  hours for cough, as needed. Advised patient to take medication as directed with food to completion.  Advised may use Tessalon capsules daily or as needed for cough. Advised may use Hycodan cough syrup for cough at night prior to sleep due to sedative effects.  Encouraged to increase daily water intake to 64 ounces per day while taking these medications.  Advised if symptoms worsen and/or unresolved please follow-up with your PCP or here for further evaluation.  Final Clinical Impressions(s) / UC Diagnoses   Final diagnoses:  Cough, unspecified type  COVID-19     Discharge Instructions      Advised patient to take medication as directed with food to completion.  Advised may use Tessalon capsules daily or as needed for cough. Advised may use Hycodan cough syrup for cough at night prior to sleep due to sedative effects.  Encouraged to increase daily water intake to 64 ounces per day while taking these medications.  Advised if symptoms worsen and/or unresolved please follow-up with your PCP or here for further evaluation.     ED Prescriptions     Medication Sig Dispense Auth. Provider   nirmatrelvir/ritonavir (PAXLOVID, 300/100,) 20 x 150 MG & 10 x 100MG  TBPK Take 3 tablets by mouth 2 (two) times daily for 5 days. Patient GFR is 97. Take nirmatrelvir (150 mg) two tablets twice daily for 5 days and ritonavir (100 mg) one tablet twice daily for 5 days. 30 tablet Trevor Iha, FNP   benzonatate (TESSALON) 200 MG capsule Take 1 capsule (200 mg total) by mouth 3 (three) times daily as needed for up to 7 days. 40 capsule Trevor Iha, FNP   HYDROcodone bit-homatropine (HYCODAN) 5-1.5 MG/5ML syrup Take 5 mLs by mouth every 6 (six) hours as needed for cough. 120 mL Trevor Iha, FNP      I have reviewed the PDMP during this encounter.   Trevor Iha, FNP 12/20/23 1949

## 2023-12-20 NOTE — Discharge Instructions (Addendum)
Advised patient to take medication as directed with food to completion.  Advised may use Tessalon capsules daily or as needed for cough. Advised may use Hycodan cough syrup for cough at night prior to sleep due to sedative effects.  Encouraged to increase daily water intake to 64 ounces per day while taking these medications.  Advised if symptoms worsen and/or unresolved please follow-up with your PCP or here for further evaluation.

## 2023-12-24 ENCOUNTER — Telehealth: Payer: Commercial Managed Care - PPO | Admitting: Physician Assistant

## 2023-12-24 ENCOUNTER — Ambulatory Visit: Payer: Commercial Managed Care - PPO | Admitting: Physical Therapy

## 2023-12-24 DIAGNOSIS — B999 Unspecified infectious disease: Secondary | ICD-10-CM | POA: Diagnosis not present

## 2023-12-24 DIAGNOSIS — J019 Acute sinusitis, unspecified: Secondary | ICD-10-CM | POA: Diagnosis not present

## 2023-12-24 DIAGNOSIS — Z1231 Encounter for screening mammogram for malignant neoplasm of breast: Secondary | ICD-10-CM

## 2023-12-24 MED ORDER — DOXYCYCLINE HYCLATE 100 MG PO TABS
100.0000 mg | ORAL_TABLET | Freq: Two times a day (BID) | ORAL | 0 refills | Status: DC
Start: 2023-12-24 — End: 2024-02-21

## 2023-12-24 NOTE — Progress Notes (Signed)
 E-Visit for Sinus Problems  We are sorry that you are not feeling well.  Here is how we plan to help!  Based on what you have shared with me it looks like you have a secondary sinusitis from your COVID-19.  Sinusitis is inflammation and infection in the sinus cavities of the head.  I have prescribed Doxycycline 100mg  by mouth twice a day for 10 days. You may use an oral decongestant such as Mucinex D or if you have glaucoma or high blood pressure use plain Mucinex. Saline nasal spray help and can safely be used as often as needed for congestion.  If you develop worsening sinus pain, fever or notice severe headache and vision changes, or if symptoms are not better after completion of antibiotic, please schedule an appointment with a health care provider.    Sinus infections are not as easily transmitted as other respiratory infection, however we still recommend that you avoid close contact with loved ones, especially the very young and elderly.  Remember to wash your hands thoroughly throughout the day as this is the number one way to prevent the spread of infection!  Home Care: Only take medications as instructed by your medical team. Complete the entire course of an antibiotic. Do not take these medications with alcohol. A steam or ultrasonic humidifier can help congestion.  You can place a towel over your head and breathe in the steam from hot water coming from a faucet. Avoid close contacts especially the very young and the elderly. Cover your mouth when you cough or sneeze. Always remember to wash your hands.  Get Help Right Away If: You develop worsening fever or sinus pain. You develop a severe head ache or visual changes. Your symptoms persist after you have completed your treatment plan.  Make sure you Understand these instructions. Will watch your condition. Will get help right away if you are not doing well or get worse.  Thank you for choosing an e-visit.  Your e-visit answers  were reviewed by a board certified advanced clinical practitioner to complete your personal care plan. Depending upon the condition, your plan could have included both over the counter or prescription medications.  Please review your pharmacy choice. Make sure the pharmacy is open so you can pick up prescription now. If there is a problem, you may contact your provider through [employer] and have the prescription routed to another pharmacy.  Your safety is important to [place]. If you have drug allergies check your prescription carefully.   For the next 24 hours you can use MyChart to ask questions about today's visit, request a non-urgent call back, or ask for a work or school excuse. You will get an email in the next two days asking about your experience. I hope that your e-visit has been valuable and will speed your recovery.

## 2023-12-24 NOTE — Progress Notes (Signed)
 I have spent 5 minutes in review of e-visit questionnaire, review and updating patient chart, medical decision making and response to patient.   Piedad Climes, PA-C

## 2023-12-25 ENCOUNTER — Encounter (INDEPENDENT_AMBULATORY_CARE_PROVIDER_SITE_OTHER): Payer: Self-pay

## 2023-12-25 ENCOUNTER — Ambulatory Visit (INDEPENDENT_AMBULATORY_CARE_PROVIDER_SITE_OTHER): Payer: Commercial Managed Care - PPO | Admitting: Otolaryngology

## 2023-12-25 VITALS — BP 144/83 | HR 75 | Ht 65.0 in | Wt 170.0 lb

## 2023-12-25 DIAGNOSIS — J342 Deviated nasal septum: Secondary | ICD-10-CM | POA: Diagnosis not present

## 2023-12-25 DIAGNOSIS — J0101 Acute recurrent maxillary sinusitis: Secondary | ICD-10-CM | POA: Diagnosis not present

## 2023-12-25 DIAGNOSIS — U071 COVID-19: Secondary | ICD-10-CM | POA: Diagnosis not present

## 2023-12-25 DIAGNOSIS — R053 Chronic cough: Secondary | ICD-10-CM

## 2023-12-25 DIAGNOSIS — J343 Hypertrophy of nasal turbinates: Secondary | ICD-10-CM | POA: Diagnosis not present

## 2023-12-25 MED ORDER — MONTELUKAST SODIUM 10 MG PO TABS
10.0000 mg | ORAL_TABLET | Freq: Every day | ORAL | 6 refills | Status: DC
  Filled 2024-01-17 – 2024-01-18 (×2): qty 90, 90d supply, fill #0
  Filled 2024-04-26: qty 90, 90d supply, fill #1

## 2023-12-26 ENCOUNTER — Telehealth: Payer: Self-pay | Admitting: Family Medicine

## 2023-12-26 ENCOUNTER — Other Ambulatory Visit (HOSPITAL_BASED_OUTPATIENT_CLINIC_OR_DEPARTMENT_OTHER): Payer: Self-pay

## 2023-12-26 ENCOUNTER — Telehealth: Payer: Self-pay

## 2023-12-26 ENCOUNTER — Ambulatory Visit: Payer: Commercial Managed Care - PPO | Admitting: Physical Therapy

## 2023-12-26 DIAGNOSIS — R2689 Other abnormalities of gait and mobility: Secondary | ICD-10-CM

## 2023-12-26 DIAGNOSIS — J342 Deviated nasal septum: Secondary | ICD-10-CM | POA: Insufficient documentation

## 2023-12-26 DIAGNOSIS — M25562 Pain in left knee: Secondary | ICD-10-CM | POA: Diagnosis not present

## 2023-12-26 DIAGNOSIS — M6281 Muscle weakness (generalized): Secondary | ICD-10-CM

## 2023-12-26 DIAGNOSIS — S83207D Unspecified tear of unspecified meniscus, current injury, left knee, subsequent encounter: Secondary | ICD-10-CM | POA: Diagnosis not present

## 2023-12-26 DIAGNOSIS — J343 Hypertrophy of nasal turbinates: Secondary | ICD-10-CM | POA: Insufficient documentation

## 2023-12-26 DIAGNOSIS — R053 Chronic cough: Secondary | ICD-10-CM | POA: Insufficient documentation

## 2023-12-26 DIAGNOSIS — U071 COVID-19: Secondary | ICD-10-CM | POA: Insufficient documentation

## 2023-12-26 MED ORDER — HYDROCODONE BIT-HOMATROP MBR 5-1.5 MG/5ML PO SOLN: 5.0000 mL | Freq: Four times a day (QID) | ORAL | 0 refills | Status: DC | PRN

## 2023-12-26 MED ORDER — HYDROCODONE BIT-HOMATROP MBR 5-1.5 MG/5ML PO SOLN
5.0000 mL | Freq: Four times a day (QID) | ORAL | 0 refills | Status: DC | PRN
  Filled 2023-12-26: qty 120, 6d supply, fill #0

## 2023-12-26 NOTE — Telephone Encounter (Signed)
 Patient here at front desk wanting hycodan transferred, np ragan notified.

## 2023-12-26 NOTE — Telephone Encounter (Signed)
 Hycodan initially prescribed on Friday, 12/20/2023 was out of stock at her pharmacy patient presents to clinic this morning for hardcopy of this medication.  Prescription provided to patient per her request.

## 2023-12-26 NOTE — Therapy (Signed)
 OUTPATIENT PHYSICAL THERAPY TREATMENT   Patient Name: TERRICA DUECKER MRN: [id number] DOB:Dec 30, 1960, 63 y.o., female Today's Date: 12/26/2023  END OF SESSION:  PT End of Session - 12/26/23 0928     Visit Number 4    Number of Visits 12    Date for PT Re-Evaluation 01/16/24    Authorization Type [place] [employer]    PT Start Time 0930    PT Stop Time 1010    PT Time Calculation (min) 40 min    Activity Tolerance Patient tolerated treatment well    Behavior During Therapy WFL for tasks assessed/performed             Past Medical History:  Diagnosis Date   Acne    Arrhythmia    Bursitis, subacromial    Left   Endometriosis    GERD (gastroesophageal reflux disease)    Joint pain    Palpitations    Retinal tear    Sinusitis    SOB (shortness of breath)    Vitamin D deficiency    Past Surgical History:  Procedure Laterality Date   ABDOMINAL HYSTERECTOMY     ABDOMINAL SURGERY     csection   DILATION AND CURETTAGE OF UTERUS     SHOULDER ARTHROSCOPY WITH SUBACROMIAL DECOMPRESSION Left 10/31/2017   Procedure: SHOULDER ARTHROSCOPY WITH BURSECTOMY AND SUBACROMIAL DECOMPRESSION;  Surgeon: Jones Broom, MD;  Location: [facility] OR;  Service: Orthopedics;  Laterality: Left;  Left shoulder arthroscopy bursectomy and subacromial decompression   TONSILLECTOMY     Patient Active Problem List   Diagnosis Date Noted   Traumatic tear of meniscus of left knee 11/15/2023   Prediabetes 04/17/2023   History of colonic polyps 04/17/2023   Other fatigue 04/16/2023   SOB (shortness of breath) on exertion 04/16/2023   Health care maintenance 04/16/2023   Family history of diabetes mellitus 04/16/2023   Depression screening 04/16/2023   Generalized obesity 04/16/2023   BMI 30.0-30.9,adult 04/16/2023   Seasonal allergies 11/07/2022   Family history of osteoporosis 11/06/2022   Postmenopause 11/06/2022   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   Obesity (BMI 30-39.9) 01/26/2022    Patellofemoral pain syndrome of right knee 01/17/2022   Freiberg's disease, right 11/09/2021   Age-related osteoporosis without current pathological fracture 11/01/2021   Chronic sinusitis, unspecified 11/01/2021   Retinal tear of left eye 07/05/2021   History of laparoscopic-assisted vaginal hysterectomy 03/31/2021   History of sacrocolpopexy 03/31/2021   Blepharitis of upper and lower eyelids of both eyes 08/23/2020   Dry eye syndrome of both lacrimal glands 08/23/2020   Nuclear sclerotic cataract of both eyes 08/23/2020   Primary refluxing megaureter 03/02/2020   Sensation of feeling hot 01/18/2020   Chronic left shoulder pain 11/11/2018   Vitamin D insufficiency 11/11/2018   Low bone mass 09/26/2017   Immune to measles 02/20/2016   Allergic rhinitis 02/06/2016   GERD (gastroesophageal reflux disease) 02/06/2016   Hyperlipemia 02/06/2016   Keratoconjunctivitis sicca of both eyes not specified as Sjogren's 02/06/2016   Myopia of both eyes with astigmatism 02/06/2016   Posterior vitreous detachment of left eye 02/06/2016   Premature atrial contraction 02/06/2016   History of right salpingo-oophorectomy 03/14/2010    PCP: Loyal Jacobson, MD  REFERRING PROVIDER: Monica Becton, MD  REFERRING DIAG: S83.207D (ICD-10-CM) - Traumatic tear of meniscus of left knee, subsequent encounter  THERAPY DIAG:  Acute pain of left knee  Muscle weakness (generalized)  Other abnormalities of gait and mobility  Rationale for Evaluation and Treatment:  Rehabilitation  ONSET DATE: ~3 weeks ago  SUBJECTIVE:   SUBJECTIVE STATEMENT: Pt states she has not been back to work yet -- has been sick with COVID. Pt states her knee has been doing okay since she has not been working. Has been able to go up/down stairs but sometimes had to do one step at a time.   From eval: Pt states she's had issues with both legs in the last 2 years. Pt was having L hip and leg pain in December. Pt was walking  and her knee gave way ~3 weeks ago. Pt states she has been able to do her 12 hour shifts and be careful. Pt reports pain was not so bad she needed an injection.   PERTINENT HISTORY: Frieburgs disease R foot (AVN of metatarsal), OA R knee   PAIN:  Are you having pain? Yes: NPRS scale: none currently; at worst 4 Pain location: Medial L knee Pain description: Aching, can be sharp Aggravating factors: end of 12 hour shift, twisting Relieving factors: tylenol, ice and knee brace  PRECAUTIONS: None  RED FLAGS: None   WEIGHT BEARING RESTRICTIONS: No  FALLS:  Has patient fallen in last 6 months? No  LIVING ENVIRONMENT: Lives with: lives with their spouse Lives in: House/apartment Stairs: Yes: External: 2 steps; none Has following equipment at home: None  OCCUPATION: ICU nurse at [employer]  PATIENT GOALS: Return to normal activities (pt bikes and rows x20 min each)  NEXT MD VISIT: As needed after PT  OBJECTIVE:  Note: Objective measures were completed at Evaluation unless otherwise noted.  DIAGNOSTIC FINDINGS: MRI 11/19/23 L knee IMPRESSION: 1. Complete tear at the root of the posterior horn of the medial meniscus, with near absence of meniscal tissue within the 3 mm of the posterior horn closest to the root. Associated mild extrusion of the body of the medial meniscus. 2. Tricompartmental cartilage degenerative changes, severe within the lateral patellofemoral cartilage. 3. Small joint effusion.  PATIENT SURVEYS:  Lower Extremity Functional Score: 47 / 80 = 58.8 %  EDEMA:  "A little edema"  MUSCLE LENGTH: Hamstrings: Left slightly tighter than Right Thomas test: Did not assess  PALPATION: Mostly tender about medial L knee joint line and along edema around medial patella  LOWER EXTREMITY ROM:  Active ROM Right eval Left eval  Hip flexion    Hip extension    Hip abduction    Hip adduction    Hip internal rotation    Hip external rotation    Knee flexion  125 125  Knee extension 0 -5  Ankle dorsiflexion    Ankle plantarflexion    Ankle inversion    Ankle eversion     (Blank rows = not tested)  LOWER EXTREMITY MMT:  MMT Right eval Left eval  Hip flexion 5 4*  Hip extension 4 3-  Hip abduction 5 3+  Hip adduction 5 3+  Hip internal rotation    Hip external rotation    Knee flexion 5 5  Knee extension 5 5  Ankle dorsiflexion    Ankle plantarflexion    Ankle inversion    Ankle eversion     (Blank rows = not tested, * = pain)  LOWER EXTREMITY SPECIAL TESTS:  Hip special tests: Luisa Hart (FABER) test: negative and Craig's test: negative  FUNCTIONAL TESTS:  5x STS: 15.92 sec SLS: R 20 sec, L 19 sec  GAIT: Distance walked: Into clinic Assistive device utilized: L knee brace Level of assistance: Complete Independence Comments: Mildly  antalgic with L knee slightly kept in flexion  [facility] Adult PT Treatment:                                                DATE: 12/26/23 Therapeutic Exercise: Nustep L6 x 6 min; LEs only  Neuromuscular re-ed: SLS on airex foam 3x20" Therapeutic Activity: Runner's step up 6" step x10; with airex foam on top x10 Side step up/down 6" step with airex foam on top 2x10 3 way hip slider 2x10 SL heel/toe raise 2x10 Eccentric step down from airex pad 2x10 Diver to counter 2x10   [facility] Adult PT Treatment:                                                DATE: 12/12/23 Therapeutic Exercise: NuStep level 5 x 5 minutes UE/LE  HS curl 2 x 10 blue band  Updated HEP  Neuromuscular re-ed: LAQ 2 x 10 @ 3 lbs  Hip bridge with abduction blue band 2 x 10  Therapeutic Activity: Step up knee driver 6 inch 2 x 10  Leg press 2 x 10 @ 65 lbs    [facility] Adult PT Treatment:                                                DATE: 12/10/23 Therapeutic Exercise: Nustep L5 x 5 min LEs Prone quad stretch with strap 3x30" Neuromuscular re-ed: Prone quad set 2x10 Prone hip ext with knee flexion 2x10 Hip IR red TB 2x10 Hip  ER red TB 2x10 SLR with hip ER 2x10 Therapeutic Activity: Bridge with ball squeeze 2x10 Fwd step ups 4" step 2x10  Compensatory strategies for coming down stairs without hurting L knee Side step downs on L LE            PATIENT EDUCATION:  Education details: HEP update  Person educated: Patient Education method: Explanation, Demonstration, and Handouts Education comprehension: verbalized understanding, returned demonstration, and needs further education  HOME EXERCISE PROGRAM: Access Code: [email or web address] URL: [email or web address] Date: 12/26/2023 Prepared by: Vernon Prey April Kirstie Peri  Exercises - Runner's Step Up/Down  - 1 x daily - 7 x weekly - 2 sets - 10 reps - Bridge with Hip Abduction and Resistance  - 1 x daily - 7 x weekly - 2 sets - 10 reps - Forward Step Down Touch with Heel  - 1 x daily - 7 x weekly - 2 sets - 10 reps - The Diver  - 1 x daily - 7 x weekly - 2 sets - 10 reps - Lunge Matrix on Slider  - 1 x daily - 7 x weekly - 2 sets - 10 reps  ASSESSMENT:  CLINICAL IMPRESSION: Pt has continued to be compliant to her HEP. Session focused on L LE weight bearing, balance/proprioception, endurance and stability. No pain with all exercises except a little with eccentric step downs from airex pad. Able to tolerate SLS on L LE for 20 sec demonstrating increasing overall stability. Updated pt's HEP to include more functional tasks. Will be going back to work this weekend and will see  how her knee fares.   From eval: Patient is a 63 y.o. F who was seen today for physical therapy evaluation and treatment for L knee meniscus tear. PMH significant for Frieburg's on R foot. Assessment demos generally good L knee quad strength; however, decreased lateral and medial knee stability with her pain most notable during twisting/rotary motions. Pt demos decreased hip adductor, abd and glute/extensor strength affecting her tolerance for prolonged standing and walking tasks. Pt will  benefit from PT to address these deficits for return to normal function.   OBJECTIVE IMPAIRMENTS: Abnormal gait, decreased activity tolerance, decreased endurance, decreased mobility, difficulty walking, decreased ROM, decreased strength, increased edema, increased fascial restrictions, improper body mechanics, and pain.     GOALS: Goals reviewed with patient? Yes  SHORT TERM GOALS: Target date: 12/26/2023  Pt will be ind with initial HEP Baseline: Goal status: MET  2.  Pt will demo L = R knee ROM Baseline:  Goal status: INITIAL   LONG TERM GOALS: Target date: 01/16/2024   Pt will be ind with management and progression of HEP for full return to her normal gym activities (I.e. rowing and biking) Baseline:  Goal status: INITIAL  2.  Pt will report >/=75% improvement in her overall knee pain with activity Baseline:  Goal status: INITIAL  3.  Pt will demo improved 5x STS to </=10 sec for increased functional LE strength Baseline:  Goal status: INITIAL  4.  Pt will demo at least 4+/5 hip abductor and adductor strength for improved knee stability Baseline:  Goal status: INITIAL  5.  Pt will have improved LEFS to >/=56/80 to demo MCID Baseline:  Goal status: INITIAL    PLAN:  PT FREQUENCY: 2x/week  PT DURATION: 6 weeks  PLANNED INTERVENTIONS: 97164- PT Re-evaluation, 97110-Therapeutic exercises, 97530- Therapeutic activity, 97112- Neuromuscular re-education, 97535- Self Care, 16109- Manual therapy, [id number]- Gait training, [id number]- Aquatic Therapy, 97014- Electrical stimulation (unattended), 97016- Vasopneumatic device, [id number]- Ultrasound, 09811- Ionotophoresis 4mg /ml Dexamethasone, Patient/Family education, Balance training, Stair training, Taping, Dry Needling, Joint mobilization, Cryotherapy, and Moist heat  PLAN FOR NEXT SESSION: Progress HEP. Continue hip strengthening and knee strengthening for medial/lateral knee stability. Check STG and LTGs.   Ashvik Grundman April Ma L  Sharonne Ricketts, PT, DPT 12/26/23 9:28 AM

## 2023-12-26 NOTE — Progress Notes (Signed)
 Patient ID: Danielle Braun, female   DOB: May 17, 1961, 63 y.o.   MRN: [id number]  Follow-up: Chronic nasal obstruction, recurrent sinusitis, chronic postnasal drainage New complaints: COVID infection, recurrent coughing spells  HPI: The patient is a 63 year old female who presents today with multiple medical issues.  She is complaining of recurrent sinusitis, frequent coughing spells, and COVID infection.  The patient was previously seen for chronic nasal obstruction, postnasal drainage, and recurrent sinus infections.  At her last visit, she was noted to have nasal mucosal congestion, nasal septal deviation, and bilateral inferior turbinate hypertrophy.  She was treated with Flonase and Atrovent nasal sprays.  The patient also uses Allegra daily.  The patient presents today complaining of increasing nasal congestion, facial pressure, facial pain, and recurrent cough for the past week.  She was tested positive for COVID 1 week ago.  She was started on doxycycline yesterday to treat her sinusitis.  Exam: General: Communicates without difficulty, well nourished, no acute distress. Head: Normocephalic, no evidence injury, no tenderness, facial buttresses intact without stepoff. Face/sinus: No tenderness to palpation and percussion. Facial movement is normal and symmetric. Eyes: PERRL, EOMI. No scleral icterus, conjunctivae clear. Neuro: CN II exam reveals vision grossly intact.  No nystagmus at any point of gaze. Ears: Auricles well formed without lesions.  Ear canals are intact without mass or lesion.  No erythema or edema is appreciated.  The TMs are intact without fluid. Nose: External evaluation reveals normal support and skin without lesions.  Dorsum is intact.  Anterior rhinoscopy reveals congested mucosa over anterior aspect of inferior turbinates and deviated septum.  No purulence noted. Oral:  Oral cavity and oropharynx are intact, symmetric, without erythema or edema.  Mucosa is moist without lesions.  Neck: Full range of motion without pain.  There is no significant lymphadenopathy.  No masses palpable.  Thyroid bed within normal limits to palpation.  Parotid glands and submandibular glands equal bilaterally without mass.  Trachea is midline. Neuro:  CN 2-12 grossly intact.   Assessment: 1.  Acute rhinosinusitis, with edematous and erythematous nasal mucosa. 2.  Chronic rhinitis with nasal mucosal congestion, nasal septal deviation, and bilateral inferior turbinate hypertrophy. 3.  Chronic postnasal drainage. 4.  Acute COVID infection. 5.  Her coughing spells may be secondary to her COVID infection and postnasal drainage.  Plan: 1.  The physical exam findings are reviewed with the patient. 2.  Continue with Flonase and Atrovent nasal sprays.   3.  Complete her current course of doxycycline. 4.  Daily Allegra and Singulair. 5.  The patient will return for reevaluation if she continues to be symptomatic. 6.  The patient is encouraged to call with any questions or concerns.

## 2023-12-31 ENCOUNTER — Ambulatory Visit: Payer: Commercial Managed Care - PPO | Attending: Sports Medicine

## 2023-12-31 DIAGNOSIS — M25562 Pain in left knee: Secondary | ICD-10-CM | POA: Insufficient documentation

## 2023-12-31 DIAGNOSIS — M6281 Muscle weakness (generalized): Secondary | ICD-10-CM | POA: Diagnosis present

## 2023-12-31 DIAGNOSIS — M5459 Other low back pain: Secondary | ICD-10-CM | POA: Insufficient documentation

## 2023-12-31 DIAGNOSIS — R2689 Other abnormalities of gait and mobility: Secondary | ICD-10-CM | POA: Diagnosis present

## 2023-12-31 NOTE — Therapy (Signed)
 OUTPATIENT PHYSICAL THERAPY TREATMENT   Patient Name: Danielle Braun MRN: [id number] DOB:May 27, 1961, 63 y.o., female Today's Date: 12/31/2023  END OF SESSION:  PT End of Session - 12/31/23 0927     Visit Number 5    Number of Visits 12    Date for PT Re-Evaluation 01/16/24    Authorization Type [facility] [employer]    PT Start Time [phone]    PT Stop Time 1013    PT Time Calculation (min) 45 min    Activity Tolerance Patient tolerated treatment well    Behavior During Therapy WFL for tasks assessed/performed              Past Medical History:  Diagnosis Date   Acne    Arrhythmia    Bursitis, subacromial    Left   Endometriosis    GERD (gastroesophageal reflux disease)    Joint pain    Palpitations    Retinal tear    Sinusitis    SOB (shortness of breath)    Vitamin D deficiency    Past Surgical History:  Procedure Laterality Date   ABDOMINAL HYSTERECTOMY     ABDOMINAL SURGERY     csection   DILATION AND CURETTAGE OF UTERUS     SHOULDER ARTHROSCOPY WITH SUBACROMIAL DECOMPRESSION Left 10/31/2017   Procedure: SHOULDER ARTHROSCOPY WITH BURSECTOMY AND SUBACROMIAL DECOMPRESSION;  Surgeon: Jones Broom, MD;  Location: [facility] OR;  Service: Orthopedics;  Laterality: Left;  Left shoulder arthroscopy bursectomy and subacromial decompression   TONSILLECTOMY     Patient Active Problem List   Diagnosis Date Noted   COVID 12/26/2023   Chronic cough 12/26/2023   Deviated nasal septum 12/26/2023   Hypertrophy of nasal turbinates 12/26/2023   Traumatic tear of meniscus of left knee 11/15/2023   Prediabetes 04/17/2023   History of colonic polyps 04/17/2023   Other fatigue 04/16/2023   SOB (shortness of breath) on exertion 04/16/2023   Health care maintenance 04/16/2023   Family history of diabetes mellitus 04/16/2023   Depression screening 04/16/2023   Generalized obesity 04/16/2023   BMI 30.0-30.9,adult 04/16/2023   Seasonal allergies 11/07/2022   Family history of  osteoporosis 11/06/2022   Postmenopause 11/06/2022   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   Obesity (BMI 30-39.9) 01/26/2022   Patellofemoral pain syndrome of right knee 01/17/2022   Freiberg's disease, right 11/09/2021   Age-related osteoporosis without current pathological fracture 11/01/2021   Chronic sinusitis, unspecified 11/01/2021   Retinal tear of left eye 07/05/2021   History of laparoscopic-assisted vaginal hysterectomy 03/31/2021   History of sacrocolpopexy 03/31/2021   Blepharitis of upper and lower eyelids of both eyes 08/23/2020   Dry eye syndrome of both lacrimal glands 08/23/2020   Nuclear sclerotic cataract of both eyes 08/23/2020   Primary refluxing megaureter 03/02/2020   Sensation of feeling hot 01/18/2020   Chronic left shoulder pain 11/11/2018   Vitamin D insufficiency 11/11/2018   Low bone mass 09/26/2017   Immune to measles 02/20/2016   Allergic rhinitis 02/06/2016   GERD (gastroesophageal reflux disease) 02/06/2016   Hyperlipemia 02/06/2016   Keratoconjunctivitis sicca of both eyes not specified as Sjogren's 02/06/2016   Myopia of both eyes with astigmatism 02/06/2016   Posterior vitreous detachment of left eye 02/06/2016   Premature atrial contraction 02/06/2016   History of right salpingo-oophorectomy 03/14/2010   Acute recurrent maxillary sinusitis 11/06/2008    PCP: Loyal Jacobson, MD  REFERRING PROVIDER: Monica Becton, MD  REFERRING DIAG: S83.207D (ICD-10-CM) - Traumatic tear of meniscus of  left knee, subsequent encounter  THERAPY DIAG:  Acute pain of left knee  Muscle weakness (generalized)  Other abnormalities of gait and mobility  Rationale for Evaluation and Treatment: Rehabilitation  ONSET DATE: ~3 weeks ago  SUBJECTIVE:   SUBJECTIVE STATEMENT: "A little sore this morning, but not too bad."   From eval: Pt states she's had issues with both legs in the last 2 years. Pt was having L hip and leg pain in December. Pt  was walking and her knee gave way ~3 weeks ago. Pt states she has been able to do her 12 hour shifts and be careful. Pt reports pain was not so bad she needed an injection.   PERTINENT HISTORY: Frieburgs disease R foot (AVN of metatarsal), OA R knee   PAIN:  Are you having pain? Yes: NPRS scale: 3 Pain location: Medial LT knee Pain description: sore Aggravating factors: end of 12 hour shift, twisting Relieving factors: tylenol, ice and knee brace  PRECAUTIONS: None  RED FLAGS: None   WEIGHT BEARING RESTRICTIONS: No  FALLS:  Has patient fallen in last 6 months? No  LIVING ENVIRONMENT: Lives with: lives with their spouse Lives in: House/apartment Stairs: Yes: External: 2 steps; none Has following equipment at home: None  OCCUPATION: ICU nurse at [employer]  PATIENT GOALS: Return to normal activities (pt bikes and rows x20 min each)  NEXT MD VISIT: As needed after PT  OBJECTIVE:  Note: Objective measures were completed at Evaluation unless otherwise noted.  DIAGNOSTIC FINDINGS: MRI 11/19/23 L knee IMPRESSION: 1. Complete tear at the root of the posterior horn of the medial meniscus, with near absence of meniscal tissue within the 3 mm of the posterior horn closest to the root. Associated mild extrusion of the body of the medial meniscus. 2. Tricompartmental cartilage degenerative changes, severe within the lateral patellofemoral cartilage. 3. Small joint effusion.  PATIENT SURVEYS:  Lower Extremity Functional Score: 47 / 80 = 58.8 %  EDEMA:  "A little edema"  MUSCLE LENGTH: Hamstrings: Left slightly tighter than Right Thomas test: Did not assess  PALPATION: Mostly tender about medial L knee joint line and along edema around medial patella  LOWER EXTREMITY ROM:  Active ROM Right eval Left eval 12/31/23   Hip flexion     Hip extension     Hip abduction     Hip adduction     Hip internal rotation     Hip external rotation     Knee flexion 125 125 Lt:  140; Rt: 140   Knee extension 0 -5 Lt: 0; Rt: 0   Ankle dorsiflexion     Ankle plantarflexion     Ankle inversion     Ankle eversion      (Blank rows = not tested)  LOWER EXTREMITY MMT:  MMT Right eval Left eval  Hip flexion 5 4*  Hip extension 4 3-  Hip abduction 5 3+  Hip adduction 5 3+  Hip internal rotation    Hip external rotation    Knee flexion 5 5  Knee extension 5 5  Ankle dorsiflexion    Ankle plantarflexion    Ankle inversion    Ankle eversion     (Blank rows = not tested, * = pain)  LOWER EXTREMITY SPECIAL TESTS:  Hip special tests: Luisa Hart (FABER) test: negative and Craig's test: negative  FUNCTIONAL TESTS:  5x STS: 15.92 sec SLS: R 20 sec, L 19 sec  GAIT: Distance walked: Into clinic Assistive device utilized: L knee brace  Level of assistance: Complete Independence Comments: Mildly antalgic with L knee slightly kept in flexion [facility] Adult PT Treatment:                                                DATE: 12/31/23 Therapeutic Exercise: NuStep level 6 LE/UE x 5 minutes Neuromuscular re-ed: 3 way hip x 10 each  LAQ 4# x 15 each  Therapeutic Activity: Eccentric leg press 2 x 10 @ 65 lbs  Lateral step down 2 x 10 @ 4 inch  Self Care: Discussed anatomy of the knee as it relates to the mensicus Recommended to begin weaning from brace at home  Activity to avoid including deep squats, pivoting/cutting on the knee   [facility] Adult PT Treatment:                                                DATE: 12/26/23 Therapeutic Exercise: Nustep L6 x 6 min; LEs only  Neuromuscular re-ed: SLS on airex foam 3x20" Therapeutic Activity: Runner's step up 6" step x10; with airex foam on top x10 Side step up/down 6" step with airex foam on top 2x10 3 way hip slider 2x10 SL heel/toe raise 2x10 Eccentric step down from airex pad 2x10 Diver to counter 2x10   [facility] Adult PT Treatment:                                                DATE: 12/12/23 Therapeutic Exercise: NuStep  level 5 x 5 minutes UE/LE  HS curl 2 x 10 blue band  Updated HEP  Neuromuscular re-ed: LAQ 2 x 10 @ 3 lbs  Hip bridge with abduction blue band 2 x 10  Therapeutic Activity: Step up knee driver 6 inch 2 x 10  Leg press 2 x 10 @ 65 lbs             PATIENT EDUCATION:  Education details: HEP update  Person educated: Patient Education method: Explanation, Demonstration, and Handouts Education comprehension: verbalized understanding, returned demonstration, and needs further education  HOME EXERCISE PROGRAM: Access Code: [email or web address] URL: [email or web address] Date: 12/31/2023 Prepared by: Letitia Libra  Exercises - Runner's Step Up/Down  - 1 x daily - 7 x weekly - 2 sets - 10 reps - Bridge with Hip Abduction and Resistance  - 1 x daily - 7 x weekly - 2 sets - 10 reps - Forward Step Down Touch with Heel  - 1 x daily - 7 x weekly - 2 sets - 10 reps - The Diver  - 1 x daily - 7 x weekly - 2 sets - 10 reps - Lunge Matrix on Slider  - 1 x daily - 7 x weekly - 2 sets - 10 reps - Standing 3-way Hip with Walker  - 1 x daily - 7 x weekly - 2 sets - 10 reps  ASSESSMENT:  CLINICAL IMPRESSION: Patient noted to have full and pain free Lt knee AROM having met this short term goal. Focused on progression of eccentric quad strengthening and SL standing strength progression with good tolerance. Visible shaking in quad with  step down, but is able to maintain proper mechanics and control with lowering without onset of pain. Overall good stability with SL tasks, but requires occasional use of reaching strategy to regain her balance. Recommended to begin weaning from brace with household ambulation with patient verbalizing understanding.   From eval: Patient is a 63 y.o. F who was seen today for physical therapy evaluation and treatment for L knee meniscus tear. PMH significant for Frieburg's on R foot. Assessment demos generally good L knee quad strength; however, decreased lateral and medial  knee stability with her pain most notable during twisting/rotary motions. Pt demos decreased hip adductor, abd and glute/extensor strength affecting her tolerance for prolonged standing and walking tasks. Pt will benefit from PT to address these deficits for return to normal function.   OBJECTIVE IMPAIRMENTS: Abnormal gait, decreased activity tolerance, decreased endurance, decreased mobility, difficulty walking, decreased ROM, decreased strength, increased edema, increased fascial restrictions, improper body mechanics, and pain.     GOALS: Goals reviewed with patient? Yes  SHORT TERM GOALS: Target date: 12/26/2023  Pt will be ind with initial HEP Baseline: Goal status: MET  2.  Pt will demo L = R knee ROM Baseline:  Goal status: MET   LONG TERM GOALS: Target date: 01/16/2024   Pt will be ind with management and progression of HEP for full return to her normal gym activities (I.e. rowing and biking) Baseline:  Goal status: INITIAL  2.  Pt will report >/=75% improvement in her overall knee pain with activity Baseline:  Goal status: INITIAL  3.  Pt will demo improved 5x STS to </=10 sec for increased functional LE strength Baseline:  Goal status: INITIAL  4.  Pt will demo at least 4+/5 hip abductor and adductor strength for improved knee stability Baseline:  Goal status: INITIAL  5.  Pt will have improved LEFS to >/=56/80 to demo MCID Baseline:  Goal status: INITIAL    PLAN:  PT FREQUENCY: 2x/week  PT DURATION: 6 weeks  PLANNED INTERVENTIONS: 97164- PT Re-evaluation, 97110-Therapeutic exercises, 97530- Therapeutic activity, 97112- Neuromuscular re-education, 97535- Self Care, 72536- Manual therapy, [id number]- Gait training, [phone]- Aquatic Therapy, 97014- Electrical stimulation (unattended), 97016- Vasopneumatic device, [id number]- Ultrasound, 47425- Ionotophoresis 4mg /ml Dexamethasone, Patient/Family education, Balance training, Stair training, Taping, Dry Needling, Joint  mobilization, Cryotherapy, and Moist heat  PLAN FOR NEXT SESSION: Progress HEP. Continue hip strengthening and knee strengthening for medial/lateral knee stability.   Letitia Libra, PT, DPT, ATC 12/31/23 10:14 AM

## 2024-01-01 ENCOUNTER — Ambulatory Visit
Admission: RE | Admit: 2024-01-01 | Discharge: 2024-01-01 | Disposition: A | Discharge status: Home / Self Care | Payer: Commercial Managed Care - PPO | Source: Ambulatory Visit | Attending: Family Medicine | Admitting: Family Medicine

## 2024-01-01 DIAGNOSIS — Z1231 Encounter for screening mammogram for malignant neoplasm of breast: Secondary | ICD-10-CM

## 2024-01-02 ENCOUNTER — Ambulatory Visit: Payer: Commercial Managed Care - PPO | Admitting: Nurse Practitioner

## 2024-01-02 ENCOUNTER — Encounter: Payer: Self-pay | Admitting: Nurse Practitioner

## 2024-01-02 ENCOUNTER — Ambulatory Visit: Payer: Commercial Managed Care - PPO

## 2024-01-02 VITALS — BP 120/75 | HR 71 | Temp 97.9°F | Ht 65.0 in | Wt 173.0 lb

## 2024-01-02 DIAGNOSIS — Z6828 Body mass index (BMI) 28.0-28.9, adult: Secondary | ICD-10-CM | POA: Diagnosis not present

## 2024-01-02 DIAGNOSIS — E7849 Other hyperlipidemia: Secondary | ICD-10-CM | POA: Diagnosis not present

## 2024-01-02 DIAGNOSIS — M25562 Pain in left knee: Secondary | ICD-10-CM

## 2024-01-02 DIAGNOSIS — M5459 Other low back pain: Secondary | ICD-10-CM | POA: Diagnosis not present

## 2024-01-02 DIAGNOSIS — R2689 Other abnormalities of gait and mobility: Secondary | ICD-10-CM | POA: Diagnosis not present

## 2024-01-02 DIAGNOSIS — E669 Obesity, unspecified: Secondary | ICD-10-CM

## 2024-01-02 DIAGNOSIS — M6281 Muscle weakness (generalized): Secondary | ICD-10-CM | POA: Diagnosis not present

## 2024-01-02 NOTE — Therapy (Signed)
 OUTPATIENT PHYSICAL THERAPY TREATMENT   Patient Name: Danielle Braun MRN: [id number] DOB:1961/10/02, 63 y.o., female Today's Date: 01/02/2024  END OF SESSION:  PT End of Session - 01/02/24 1404     Visit Number 6    Number of Visits 12    Date for PT Re-Evaluation 01/16/24    Authorization Type [facility] [employer]    PT Start Time [phone]    PT Stop Time 1444    PT Time Calculation (min) 39 min    Activity Tolerance Patient tolerated treatment well    Behavior During Therapy WFL for tasks assessed/performed               Past Medical History:  Diagnosis Date   Acne    Arrhythmia    Bursitis, subacromial    Left   Endometriosis    GERD (gastroesophageal reflux disease)    Joint pain    Palpitations    Retinal tear    Sinusitis    SOB (shortness of breath)    Vitamin D deficiency    Past Surgical History:  Procedure Laterality Date   ABDOMINAL HYSTERECTOMY     ABDOMINAL SURGERY     csection   DILATION AND CURETTAGE OF UTERUS     SHOULDER ARTHROSCOPY WITH SUBACROMIAL DECOMPRESSION Left 10/31/2017   Procedure: SHOULDER ARTHROSCOPY WITH BURSECTOMY AND SUBACROMIAL DECOMPRESSION;  Surgeon: Jones Broom, MD;  Location: [facility] OR;  Service: Orthopedics;  Laterality: Left;  Left shoulder arthroscopy bursectomy and subacromial decompression   TONSILLECTOMY     Patient Active Problem List   Diagnosis Date Noted   COVID 12/26/2023   Chronic cough 12/26/2023   Deviated nasal septum 12/26/2023   Hypertrophy of nasal turbinates 12/26/2023   Traumatic tear of meniscus of left knee 11/15/2023   Prediabetes 04/17/2023   History of colonic polyps 04/17/2023   Other fatigue 04/16/2023   SOB (shortness of breath) on exertion 04/16/2023   Health care maintenance 04/16/2023   Family history of diabetes mellitus 04/16/2023   Depression screening 04/16/2023   Generalized obesity 04/16/2023   BMI 30.0-30.9,adult 04/16/2023   Seasonal allergies 11/07/2022   Family history of  osteoporosis 11/06/2022   Postmenopause 11/06/2022   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   Obesity (BMI 30-39.9) 01/26/2022   Patellofemoral pain syndrome of right knee 01/17/2022   Freiberg's disease, right 11/09/2021   Age-related osteoporosis without current pathological fracture 11/01/2021   Chronic sinusitis, unspecified 11/01/2021   Retinal tear of left eye 07/05/2021   History of laparoscopic-assisted vaginal hysterectomy 03/31/2021   History of sacrocolpopexy 03/31/2021   Blepharitis of upper and lower eyelids of both eyes 08/23/2020   Dry eye syndrome of both lacrimal glands 08/23/2020   Nuclear sclerotic cataract of both eyes 08/23/2020   Primary refluxing megaureter 03/02/2020   Sensation of feeling hot 01/18/2020   Chronic left shoulder pain 11/11/2018   Vitamin D insufficiency 11/11/2018   Low bone mass 09/26/2017   Immune to measles 02/20/2016   Allergic rhinitis 02/06/2016   GERD (gastroesophageal reflux disease) 02/06/2016   Hyperlipemia 02/06/2016   Keratoconjunctivitis sicca of both eyes not specified as Sjogren's 02/06/2016   Myopia of both eyes with astigmatism 02/06/2016   Posterior vitreous detachment of left eye 02/06/2016   Premature atrial contraction 02/06/2016   History of right salpingo-oophorectomy 03/14/2010   Acute recurrent maxillary sinusitis 11/06/2008    PCP: Loyal Jacobson, MD  REFERRING PROVIDER: Monica Becton, MD  REFERRING DIAG: S83.207D (ICD-10-CM) - Traumatic tear of meniscus  of left knee, subsequent encounter  THERAPY DIAG:  Acute pain of left knee  Muscle weakness (generalized)  Other abnormalities of gait and mobility  Rationale for Evaluation and Treatment: Rehabilitation  ONSET DATE: ~3 weeks ago  SUBJECTIVE:   SUBJECTIVE STATEMENT: Patient reports no pain right now, but occasional twinges in the knee.   From eval: Pt states she's had issues with both legs in the last 2 years. Pt was having L hip and  leg pain in December. Pt was walking and her knee gave way ~3 weeks ago. Pt states she has been able to do her 12 hour shifts and be careful. Pt reports pain was not so bad she needed an injection.   PERTINENT HISTORY: Frieburgs disease R foot (AVN of metatarsal), OA R knee   PAIN:  Are you having pain? Yes: NPRS scale: none currently; at worst 2/10  Pain location: Medial LT knee Pain description: sore Aggravating factors: end of 12 hour shift, twisting Relieving factors: tylenol, ice and knee brace  PRECAUTIONS: None  RED FLAGS: None   WEIGHT BEARING RESTRICTIONS: No  FALLS:  Has patient fallen in last 6 months? No  LIVING ENVIRONMENT: Lives with: lives with their spouse Lives in: House/apartment Stairs: Yes: External: 2 steps; none Has following equipment at home: None  OCCUPATION: ICU nurse at [employer]  PATIENT GOALS: Return to normal activities (pt bikes and rows x20 min each)  NEXT MD VISIT: As needed after PT  OBJECTIVE:  Note: Objective measures were completed at Evaluation unless otherwise noted.  DIAGNOSTIC FINDINGS: MRI 11/19/23 L knee IMPRESSION: 1. Complete tear at the root of the posterior horn of the medial meniscus, with near absence of meniscal tissue within the 3 mm of the posterior horn closest to the root. Associated mild extrusion of the body of the medial meniscus. 2. Tricompartmental cartilage degenerative changes, severe within the lateral patellofemoral cartilage. 3. Small joint effusion.  PATIENT SURVEYS:  Lower Extremity Functional Score: 47 / 80 = 58.8 %  EDEMA:  "A little edema"  MUSCLE LENGTH: Hamstrings: Left slightly tighter than Right Thomas test: Did not assess  PALPATION: Mostly tender about medial L knee joint line and along edema around medial patella  LOWER EXTREMITY ROM:  Active ROM Right eval Left eval 12/31/23   Hip flexion     Hip extension     Hip abduction     Hip adduction     Hip internal rotation      Hip external rotation     Knee flexion 125 125 Lt: 140; Rt: 140   Knee extension 0 -5 Lt: 0; Rt: 0   Ankle dorsiflexion     Ankle plantarflexion     Ankle inversion     Ankle eversion      (Blank rows = not tested)  LOWER EXTREMITY MMT:  MMT Right eval Left eval  Hip flexion 5 4*  Hip extension 4 3-  Hip abduction 5 3+  Hip adduction 5 3+  Hip internal rotation    Hip external rotation    Knee flexion 5 5  Knee extension 5 5  Ankle dorsiflexion    Ankle plantarflexion    Ankle inversion    Ankle eversion     (Blank rows = not tested, * = pain)  LOWER EXTREMITY SPECIAL TESTS:  Hip special tests: Luisa Hart (FABER) test: negative and Craig's test: negative  FUNCTIONAL TESTS:  5x STS: 15.92 sec SLS: R 20 sec, L 19 sec  GAIT: Distance  walked: Into clinic Assistive device utilized: L knee brace Level of assistance: Complete Independence Comments: Mildly antalgic with L knee slightly kept in flexion [facility] Adult PT Treatment:                                                DATE: 01/02/24 Therapeutic Exercise: NuStep level 6 x 5 minutes UE/LE   Neuromuscular re-ed: SLR 2 x 15  Sidelying hip abduction 2 x 15  Therapeutic Activity: Leg press with black resistance bands at thighs for gluteal activation 2 x 15  Step up knee driver, 6 inch step 2 x 15   Self Care: Begin to wean from brace for community activity, still wear at work.   [facility] Adult PT Treatment:                                                DATE: 12/31/23 Therapeutic Exercise: NuStep level 6 LE/UE x 5 minutes Neuromuscular re-ed: 3 way hip x 10 each  LAQ 4# x 15 each  Therapeutic Activity: Eccentric leg press 2 x 10 @ 65 lbs  Lateral step down 2 x 10 @ 4 inch  Self Care: Discussed anatomy of the knee as it relates to the mensicus Recommended to begin weaning from brace at home  Activity to avoid including deep squats, pivoting/cutting on the knee   [facility] Adult PT Treatment:                                                 DATE: 12/26/23 Therapeutic Exercise: Nustep L6 x 6 min; LEs only  Neuromuscular re-ed: SLS on airex foam 3x20" Therapeutic Activity: Runner's step up 6" step x10; with airex foam on top x10 Side step up/down 6" step with airex foam on top 2x10 3 way hip slider 2x10 SL heel/toe raise 2x10 Eccentric step down from airex pad 2x10 Diver to counter 2x10            PATIENT EDUCATION:  Education details: HEP review; verbal update to increase to 15 reps    Person educated: Patient Education method: Explanation Education comprehension: verbalized understanding  HOME EXERCISE PROGRAM: Access Code: [email or web address] URL: [email or web address] Date: 12/31/2023 Prepared by: Letitia Libra  Exercises - Runner's Step Up/Down  - 1 x daily - 7 x weekly - 2 sets - 10 reps - Bridge with Hip Abduction and Resistance  - 1 x daily - 7 x weekly - 2 sets - 10 reps - Forward Step Down Touch with Heel  - 1 x daily - 7 x weekly - 2 sets - 10 reps - The Diver  - 1 x daily - 7 x weekly - 2 sets - 10 reps - Lunge Matrix on Slider  - 1 x daily - 7 x weekly - 2 sets - 10 reps - Standing 3-way Hip with Walker  - 1 x daily - 7 x weekly - 2 sets - 10 reps  ASSESSMENT:  CLINICAL IMPRESSION: Patient tolerated session well today with continued emphasis on hip and knee strengthening. Increased reps today to focus more on endurance training  with fatigue noted, but no onset of knee pain. Focused on avoiding knee hyperextension with quad strengthening with patient notably challenged in maintaining stability when she does not lock her knee.   From eval: Patient is a 63 y.o. F who was seen today for physical therapy evaluation and treatment for L knee meniscus tear. PMH significant for Frieburg's on R foot. Assessment demos generally good L knee quad strength; however, decreased lateral and medial knee stability with her pain most notable during twisting/rotary motions. Pt demos decreased hip adductor,  abd and glute/extensor strength affecting her tolerance for prolonged standing and walking tasks. Pt will benefit from PT to address these deficits for return to normal function.   OBJECTIVE IMPAIRMENTS: Abnormal gait, decreased activity tolerance, decreased endurance, decreased mobility, difficulty walking, decreased ROM, decreased strength, increased edema, increased fascial restrictions, improper body mechanics, and pain.     GOALS: Goals reviewed with patient? Yes  SHORT TERM GOALS: Target date: 12/26/2023  Pt will be ind with initial HEP Baseline: Goal status: MET  2.  Pt will demo L = R knee ROM Baseline:  Goal status: MET   LONG TERM GOALS: Target date: 01/16/2024   Pt will be ind with management and progression of HEP for full return to her normal gym activities (I.e. rowing and biking) Baseline:  Goal status: INITIAL  2.  Pt will report >/=75% improvement in her overall knee pain with activity Baseline:  Goal status: INITIAL  3.  Pt will demo improved 5x STS to </=10 sec for increased functional LE strength Baseline:  Goal status: INITIAL  4.  Pt will demo at least 4+/5 hip abductor and adductor strength for improved knee stability Baseline:  Goal status: INITIAL  5.  Pt will have improved LEFS to >/=56/80 to demo MCID Baseline:  Goal status: INITIAL    PLAN:  PT FREQUENCY: 2x/week  PT DURATION: 6 weeks  PLANNED INTERVENTIONS: 97164- PT Re-evaluation, 97110-Therapeutic exercises, 97530- Therapeutic activity, [id number]- Neuromuscular re-education, 97535- Self Care, 62952- Manual therapy, [id number]- Gait training, [phone]- Aquatic Therapy, 97014- Electrical stimulation (unattended), 97016- Vasopneumatic device, [id number]- Ultrasound, 44010- Ionotophoresis 4mg /ml Dexamethasone, Patient/Family education, Balance training, Stair training, Taping, Dry Needling, Joint mobilization, Cryotherapy, and Moist heat  PLAN FOR NEXT SESSION: Progress HEP. Continue hip strengthening and  knee strengthening for medial/lateral knee stability.   Letitia Libra, PT, DPT, ATC 01/02/24 2:44 PM

## 2024-01-02 NOTE — Progress Notes (Signed)
 Office: [phone]  /  Fax: [phone]  WEIGHT SUMMARY AND BIOMETRICS  Weight Lost Since Last Visit: 4lb  Weight Gained Since Last Visit: 0lb   Vitals Temp: 97.9 F (36.6 C) BP: 120/75 Pulse Rate: 71 SpO2: 98 %   Anthropometric Measurements Height: 5\' 5"  (1.651 m) Weight: 173 lb (78.5 kg) BMI (Calculated): 28.79 Weight at Last Visit: 177lb Weight Lost Since Last Visit: 4lb Weight Gained Since Last Visit: 0lb Starting Weight: 188lb Total Weight Loss (lbs): 15 lb (6.804 kg)   Body Composition  Body Fat %: 40.7 % Fat Mass (lbs): 70.8 lbs Muscle Mass (lbs): 97.8 lbs Total Body Water (lbs): 69.4 lbs Visceral Fat Rating : 10   Other Clinical Data Fasting: No Labs: No Today's Visit #: 10 Starting Date: 04/16/23     HPI  Chief Complaint: OBESITY  Danielle Braun is here to discuss her progress with her obesity treatment plan. She is on the the Category 2 Plan and states she is following her eating plan approximately 85 % of the time. She states she is exercising 20-30 minutes 5 days per week.   Interval History:  Since last office visit she has lost 4 pounds.  She had covid since her last visit and hasn't been able to follow the meal plan due to not feeling well. On the days she tracked-my net diary reviewed with patient-, calories 937 (sick)-1463, protein 51(sick)-99, fats 34-57 and carbs 125-176.  Notes some polyphagia at the end of the day and is asking for high protein snack ideas.  She hasn't been able to exercise as she had planned due to not feeling well.   Pharmacotherapy for weight loss: She is not currently taking medications  for medical weight loss.    Previous pharmacotherapy for medical weight loss:  none   Bariatric surgery:  Has not had bariatric surgery  Hyperlipidemia-Improved Medication(s): none.  Cardiovascular risk factors: advanced age (older than 45 for men, 86 for women) and dyslipidemia FH:  mother and father  Lab Results  Component Value Date   CHOL 190 12/04/2023   HDL 64 12/04/2023   LDLCALC 112 (H) 12/04/2023   TRIG 77 12/04/2023   Lab Results  Component Value Date   ALT 14 12/04/2023   AST 19 12/04/2023   ALKPHOS 72 12/04/2023   BILITOT <0.2 12/04/2023   The 10-year ASCVD risk score (Arnett DK, et al., 2019) is: 2.8%   Values used to calculate the score:     Age: 63 years     Sex: Female     Is Non-Hispanic African American: No     Diabetic: No     Tobacco smoker: No     Systolic Blood Pressure: 120 mmHg     Is BP treated: No     HDL Cholesterol: 64 mg/dL     Total Cholesterol: 190 mg/dL   Prediabetes Last B1Y was 5.8 on 10/29/23  Medication(s): none-we have discussed Metformin in the  past Polyphagia:Yes FH:  bother type 1 Pat aunt DMT2 Pat gm DMT2  Lab Results  Component Value Date   HGBA1C 5.7 (H) 04/16/2023   Lab Results  Component Value Date   INSULIN 9.7 04/16/2023      PHYSICAL EXAM:  Blood pressure 120/75, pulse 71, temperature 97.9 F (36.6 C), height 5\' 5"  (1.651 m), weight 173 lb (78.5 kg), SpO2 98%. Body mass index is 28.79 kg/m.  General: She is overweight, cooperative, alert, well developed, and in no acute distress. PSYCH: Has normal mood, affect and thought process.   Extremities: No edema.  Neurologic: No gross sensory or motor deficits. No tremors or fasciculations noted.    DIAGNOSTIC DATA REVIEWED:  BMET    Component Value Date/Time   NA 141 12/04/2023 0826   K 4.9 12/04/2023 0826   CL 104 12/04/2023 0826   CO2 25 12/04/2023 0826   GLUCOSE 86 12/04/2023 0826   GLUCOSE 90 10/17/2017 0920   BUN 26 12/04/2023 0826   CREATININE 0.71 12/04/2023 0826   CALCIUM 9.2 12/04/2023 0826   GFRNONAA >60 10/17/2017 0920   GFRAA >60 10/17/2017 0920   Lab Results  Component Value Date   HGBA1C 5.7 (H) 04/16/2023   Lab Results  Component Value Date   INSULIN 9.7 04/16/2023   Lab Results  Component Value Date   TSH 1.680 04/16/2023   CBC    Component Value Date/Time   WBC 5.2 12/04/2023 0826   WBC 4.3 10/17/2017 0920   RBC 4.10 12/04/2023 0826   RBC 4.26 10/17/2017 0920   HGB 12.9 12/04/2023 0826   HCT 39.6 12/04/2023 0826   PLT 272 12/04/2023 0826   MCV 97 12/04/2023 0826   MCH 31.5 12/04/2023 0826   MCH 31.7 10/17/2017 0920   MCHC 32.6 12/04/2023 0826   MCHC 33.7 10/17/2017 0920   RDW 12.5 12/04/2023 0826   Iron Studies No results found for: "IRON", "TIBC", "FERRITIN", "IRONPCTSAT" Lipid Panel  Component Value Date/Time   CHOL 190 12/04/2023 0826   TRIG 77 12/04/2023 0826   HDL 64 12/04/2023 0826   LDLCALC 112 (H) 12/04/2023 0826   Hepatic Function Panel     Component Value Date/Time   PROT 6.5 12/04/2023  0826   ALBUMIN 4.1 12/04/2023 0826   AST 19 12/04/2023 0826   ALT 14 12/04/2023 0826   ALKPHOS 72 12/04/2023 0826   BILITOT <0.2 12/04/2023 0826      Component Value Date/Time   TSH 1.680 04/16/2023 0856   Nutritional Lab Results  Component Value Date   VD25OH 51.7 04/16/2023     ASSESSMENT AND PLAN  TREATMENT PLAN FOR OBESITY:  Recommended Dietary Goals  Danielle Braun is currently in the action stage of change. As such, her goal is to continue weight management plan. She has agreed to the Category 2 Plan.  High protein snack ideas discussed with patient.  Handouts given. To reduce carbs-aim for 75-125.    Behavioral Intervention  We discussed the following Behavioral Modification Strategies today: increasing lean protein intake to established goals, decreasing simple carbohydrates , increasing vegetables, increasing fiber rich foods, work on meal planning and preparation, reading food labels , keeping healthy foods at home, continue to practice mindfulness when eating, planning for success, and continue to work on maintaining a reduced calorie state, getting the recommended amount of protein, incorporating whole foods, making healthy choices, staying well hydrated and practicing mindfulness when eating..  Additional resources provided today: NA  Recommended Physical Activity Goals  Danielle Braun has been advised to work up to 150 minutes of moderate intensity aerobic activity a week and strengthening exercises 2-3 times per week for cardiovascular health, weight loss maintenance and preservation of muscle mass.   She has agreed to Think about enjoyable ways to increase daily physical activity and overcoming barriers to exercise, Increase physical activity in their day and reduce sedentary time (increase NEAT)., Increase the intensity, frequency or duration of strengthening exercises , and Increase the intensity, frequency or duration of aerobic exercises     ASSOCIATED CONDITIONS  ADDRESSED TODAY  Action/Plan  Other hyperlipidemia Improved.  Will continue to monitor  Cardiovascular risk and specific lipid/LDL goals reviewed.  We discussed several lifestyle modifications today and Danielle Braun will continue to work on diet, exercise and weight loss efforts. Orders and follow up as documented in patient record.   Counseling Intensive lifestyle modifications are the first line treatment for this issue. Dietary changes: Increase soluble fiber. Decrease simple carbohydrates. Exercise changes: Moderate to vigorous-intensity aerobic activity 150 minutes per week if tolerated. Lipid-lowering medications: see documented in medical record.   Pre diabetes Will continue to monitor.    Generalized obesity  BMI 28.0-28.9,adult     Labs reviewed in chart with patient from 12/04/23    Return in about 4 weeks (around 01/30/2024).Marland Kitchen She was informed of the importance of frequent follow up visits to maximize her success with intensive lifestyle modifications for her multiple health conditions.   ATTESTASTION STATEMENTS:  Reviewed by clinician on day of visit: allergies, medications, problem list, medical history, surgical history, family history, social history, and previous encounter notes.     Theodis Sato. Yancy Hascall FNP-C

## 2024-01-06 DIAGNOSIS — M545 Low back pain, unspecified: Secondary | ICD-10-CM | POA: Diagnosis not present

## 2024-01-07 ENCOUNTER — Ambulatory Visit: Payer: Commercial Managed Care - PPO

## 2024-01-07 DIAGNOSIS — M5459 Other low back pain: Secondary | ICD-10-CM | POA: Diagnosis not present

## 2024-01-07 DIAGNOSIS — M6281 Muscle weakness (generalized): Secondary | ICD-10-CM | POA: Diagnosis not present

## 2024-01-07 DIAGNOSIS — R2689 Other abnormalities of gait and mobility: Secondary | ICD-10-CM | POA: Diagnosis not present

## 2024-01-07 DIAGNOSIS — M25562 Pain in left knee: Secondary | ICD-10-CM

## 2024-01-07 NOTE — Therapy (Signed)
 OUTPATIENT PHYSICAL THERAPY TREATMENT   Patient Name: Danielle Braun MRN: [id number] DOB:01-27-1961, 63 y.o., female Today's Date: 01/07/2024  END OF SESSION:  PT End of Session - 01/07/24 1532     Visit Number 7    Number of Visits 12    Date for PT Re-Evaluation 01/16/24    Authorization Type [facility] [employer]    PT Start Time 1531    PT Stop Time 1611    PT Time Calculation (min) 40 min    Activity Tolerance Patient tolerated treatment well    Behavior During Therapy WFL for tasks assessed/performed                Past Medical History:  Diagnosis Date   Acne    Arrhythmia    Bursitis, subacromial    Left   Endometriosis    GERD (gastroesophageal reflux disease)    Joint pain    Palpitations    Retinal tear    Sinusitis    SOB (shortness of breath)    Vitamin D deficiency    Past Surgical History:  Procedure Laterality Date   ABDOMINAL HYSTERECTOMY     ABDOMINAL SURGERY     csection   DILATION AND CURETTAGE OF UTERUS     SHOULDER ARTHROSCOPY WITH SUBACROMIAL DECOMPRESSION Left 10/31/2017   Procedure: SHOULDER ARTHROSCOPY WITH BURSECTOMY AND SUBACROMIAL DECOMPRESSION;  Surgeon: Jones Broom, MD;  Location: [facility] OR;  Service: Orthopedics;  Laterality: Left;  Left shoulder arthroscopy bursectomy and subacromial decompression   TONSILLECTOMY     Patient Active Problem List   Diagnosis Date Noted   COVID 12/26/2023   Chronic cough 12/26/2023   Deviated nasal septum 12/26/2023   Hypertrophy of nasal turbinates 12/26/2023   Traumatic tear of meniscus of left knee 11/15/2023   Prediabetes 04/17/2023   History of colonic polyps 04/17/2023   Other fatigue 04/16/2023   SOB (shortness of breath) on exertion 04/16/2023   Health care maintenance 04/16/2023   Family history of diabetes mellitus 04/16/2023   Depression screening 04/16/2023   Generalized obesity 04/16/2023   BMI 30.0-30.9,adult 04/16/2023   Seasonal allergies 11/07/2022   Family history of  osteoporosis 11/06/2022   Postmenopause 11/06/2022   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   Obesity (BMI 30-39.9) 01/26/2022   Patellofemoral pain syndrome of right knee 01/17/2022   Freiberg's disease, right 11/09/2021   Age-related osteoporosis without current pathological fracture 11/01/2021   Chronic sinusitis, unspecified 11/01/2021   Retinal tear of left eye 07/05/2021   History of laparoscopic-assisted vaginal hysterectomy 03/31/2021   History of sacrocolpopexy 03/31/2021   Blepharitis of upper and lower eyelids of both eyes 08/23/2020   Dry eye syndrome of both lacrimal glands 08/23/2020   Nuclear sclerotic cataract of both eyes 08/23/2020   Primary refluxing megaureter 03/02/2020   Sensation of feeling hot 01/18/2020   Chronic left shoulder pain 11/11/2018   Vitamin D insufficiency 11/11/2018   Low bone mass 09/26/2017   Immune to measles 02/20/2016   Allergic rhinitis 02/06/2016   GERD (gastroesophageal reflux disease) 02/06/2016   Hyperlipemia 02/06/2016   Keratoconjunctivitis sicca of both eyes not specified as Sjogren's 02/06/2016   Myopia of both eyes with astigmatism 02/06/2016   Posterior vitreous detachment of left eye 02/06/2016   Premature atrial contraction 02/06/2016   History of right salpingo-oophorectomy 03/14/2010   Acute recurrent maxillary sinusitis 11/06/2008    PCP: Loyal Jacobson, MD  REFERRING PROVIDER: Monica Becton, MD  REFERRING DIAG: [phone] (ICD-10-CM) - Traumatic tear of  meniscus of left knee, subsequent encounter  THERAPY DIAG:  Acute pain of left knee  Muscle weakness (generalized)  Other abnormalities of gait and mobility  Rationale for Evaluation and Treatment: Rehabilitation  ONSET DATE: ~3 weeks ago  SUBJECTIVE:   SUBJECTIVE STATEMENT: Patient saw PCP for back pain that has been present since being sick and told her to f/u in 3 weeks if back pain continues. Patient reports the knee has been ok. She has  been able to row and ride the bike, but not as vigorous as before knee issue. She has been without the brace around the house and some community activity without issues.   From eval: Pt states she's had issues with both legs in the last 2 years. Pt was having L hip and leg pain in December. Pt was walking and her knee gave way ~3 weeks ago. Pt states she has been able to do her 12 hour shifts and be careful. Pt reports pain was not so bad she needed an injection.   PERTINENT HISTORY: Frieburgs disease R foot (AVN of metatarsal), OA R knee   PAIN:  Are you having pain? Yes: NPRS scale: none currently; at worst 2/10  Pain location: Medial LT knee Pain description: sore Aggravating factors: end of 12 hour shift, twisting Relieving factors: tylenol, ice and knee brace  PRECAUTIONS: None  RED FLAGS: None   WEIGHT BEARING RESTRICTIONS: No  FALLS:  Has patient fallen in last 6 months? No  LIVING ENVIRONMENT: Lives with: lives with their spouse Lives in: House/apartment Stairs: Yes: External: 2 steps; none Has following equipment at home: None  OCCUPATION: ICU nurse at [employer]  PATIENT GOALS: Return to normal activities (pt bikes and rows x20 min each)  NEXT MD VISIT: As needed after PT  OBJECTIVE:  Note: Objective measures were completed at Evaluation unless otherwise noted.  DIAGNOSTIC FINDINGS: MRI 11/19/23 L knee IMPRESSION: 1. Complete tear at the root of the posterior horn of the medial meniscus, with near absence of meniscal tissue within the 3 mm of the posterior horn closest to the root. Associated mild extrusion of the body of the medial meniscus. 2. Tricompartmental cartilage degenerative changes, severe within the lateral patellofemoral cartilage. 3. Small joint effusion.  PATIENT SURVEYS:  Lower Extremity Functional Score: 47 / 80 = 58.8 %  EDEMA:  "A little edema"  MUSCLE LENGTH: Hamstrings: Left slightly tighter than Right Thomas test: Did not  assess  PALPATION: Mostly tender about medial L knee joint line and along edema around medial patella  LOWER EXTREMITY ROM:  Active ROM Right eval Left eval 12/31/23   Hip flexion     Hip extension     Hip abduction     Hip adduction     Hip internal rotation     Hip external rotation     Knee flexion 125 125 Lt: 140; Rt: 140   Knee extension 0 -5 Lt: 0; Rt: 0   Ankle dorsiflexion     Ankle plantarflexion     Ankle inversion     Ankle eversion      (Blank rows = not tested)  LOWER EXTREMITY MMT:  MMT Right eval Left eval  Hip flexion 5 4*  Hip extension 4 3-  Hip abduction 5 3+  Hip adduction 5 3+  Hip internal rotation    Hip external rotation    Knee flexion 5 5  Knee extension 5 5  Ankle dorsiflexion    Ankle plantarflexion  Ankle inversion    Ankle eversion     (Blank rows = not tested, * = pain)  LOWER EXTREMITY SPECIAL TESTS:  Hip special tests: Luisa Hart (FABER) test: negative and Craig's test: negative  FUNCTIONAL TESTS:  5x STS: 15.92 sec SLS: R 20 sec, L 19 sec  GAIT: Distance walked: Into clinic Assistive device utilized: L knee brace Level of assistance: Complete Independence Comments: Mildly antalgic with L knee slightly kept in flexion [facility] Adult PT Treatment:                                                DATE: 01/07/24 Therapeutic Exercise: NuStep level 6 x 5 minutes UE/LE   Neuromuscular re-ed: Step up 4 inch + airex; x 10 step up x 10 step up knee driver  Resisted backward walking 10 lbs 2 x 10  Romberg ball toss to rebounder x 10; x 10 normal stance on airex  Therapeutic Activity: SL leg press 40 lbs 2 x 10  Hip hinge 5 lb kettlebell to 6 inch step x 10     [facility] Adult PT Treatment:                                                DATE: 01/02/24 Therapeutic Exercise: NuStep level 6 x 5 minutes UE/LE   Neuromuscular re-ed: SLR 2 x 15  Sidelying hip abduction 2 x 15  Therapeutic Activity: Leg press with black resistance bands at  thighs for gluteal activation 2 x 15  Step up knee driver, 6 inch step 2 x 15   Self Care: Begin to wean from brace for community activity, still wear at work.   [facility] Adult PT Treatment:                                                DATE: 12/31/23 Therapeutic Exercise: NuStep level 6 LE/UE x 5 minutes Neuromuscular re-ed: 3 way hip x 10 each  LAQ 4# x 15 each  Therapeutic Activity: Eccentric leg press 2 x 10 @ 65 lbs  Lateral step down 2 x 10 @ 4 inch  Self Care: Discussed anatomy of the knee as it relates to the mensicus Recommended to begin weaning from brace at home  Activity to avoid including deep squats, pivoting/cutting on the knee            PATIENT EDUCATION:  Education details: HEP review Person educated: Patient Education method: Explanation Education comprehension: verbalized understanding  HOME EXERCISE PROGRAM: Access Code: [email or web address] URL: [email or web address] Date: 12/31/2023 Prepared by: Letitia Libra  Exercises - Runner's Step Up/Down  - 1 x daily - 7 x weekly - 2 sets - 10 reps - Bridge with Hip Abduction and Resistance  - 1 x daily - 7 x weekly - 2 sets - 10 reps - Forward Step Down Touch with Heel  - 1 x daily - 7 x weekly - 2 sets - 10 reps - The Diver  - 1 x daily - 7 x weekly - 2 sets - 10 reps - Lunge Matrix on Slider  - 1 x  daily - 7 x weekly - 2 sets - 10 reps - Standing 3-way Hip with Walker  - 1 x daily - 7 x weekly - 2 sets - 10 reps  ASSESSMENT:  CLINICAL IMPRESSION: Continued with functional knee/hip strengthening incorporating strengthening on unstable surfaces as able for increased proprioceptive challenge. Overall demonstrates good stability with step up knee drivers on airex with minimal sway noted and only occasional use of reaching strategy to regain balance. Worked on hip hinge lifting technique with patient requiring intermittent cues to reduce knee flexion and allow for increased hip hinge with ability to correct with  cues. No reports of knee pain throughout session.   From eval: Patient is a 63 y.o. F who was seen today for physical therapy evaluation and treatment for L knee meniscus tear. PMH significant for Frieburg's on R foot. Assessment demos generally good L knee quad strength; however, decreased lateral and medial knee stability with her pain most notable during twisting/rotary motions. Pt demos decreased hip adductor, abd and glute/extensor strength affecting her tolerance for prolonged standing and walking tasks. Pt will benefit from PT to address these deficits for return to normal function.   OBJECTIVE IMPAIRMENTS: Abnormal gait, decreased activity tolerance, decreased endurance, decreased mobility, difficulty walking, decreased ROM, decreased strength, increased edema, increased fascial restrictions, improper body mechanics, and pain.     GOALS: Goals reviewed with patient? Yes  SHORT TERM GOALS: Target date: 12/26/2023  Pt will be ind with initial HEP Baseline: Goal status: MET  2.  Pt will demo L = R knee ROM Baseline:  Goal status: MET   LONG TERM GOALS: Target date: 01/16/2024   Pt will be ind with management and progression of HEP for full return to her normal gym activities (I.e. rowing and biking) Baseline:  Goal status: INITIAL  2.  Pt will report >/=75% improvement in her overall knee pain with activity Baseline:  Goal status: INITIAL  3.  Pt will demo improved 5x STS to </=10 sec for increased functional LE strength Baseline:  Goal status: INITIAL  4.  Pt will demo at least 4+/5 hip abductor and adductor strength for improved knee stability Baseline:  Goal status: INITIAL  5.  Pt will have improved LEFS to >/=56/80 to demo MCID Baseline:  Goal status: INITIAL    PLAN:  PT FREQUENCY: 2x/week  PT DURATION: 6 weeks  PLANNED INTERVENTIONS: 97164- PT Re-evaluation, 97110-Therapeutic exercises, 97530- Therapeutic activity, [id number]- Neuromuscular re-education,  97535- Self Care, [id number]- Manual therapy, [id number]- Gait training, [phone]- Aquatic Therapy, 97014- Electrical stimulation (unattended), 97016- Vasopneumatic device, [id number]- Ultrasound, 82956- Ionotophoresis 4mg /ml Dexamethasone, Patient/Family education, Balance training, Stair training, Taping, Dry Needling, Joint mobilization, Cryotherapy, and Moist heat  PLAN FOR NEXT SESSION: Progress HEP. Continue hip strengthening and knee strengthening for medial/lateral knee stability. Functional strengthening and dynamic balance activity.   Letitia Libra, PT, DPT, ATC 01/07/24 4:13 PM

## 2024-01-09 ENCOUNTER — Ambulatory Visit: Payer: Commercial Managed Care - PPO

## 2024-01-09 DIAGNOSIS — M6281 Muscle weakness (generalized): Secondary | ICD-10-CM | POA: Diagnosis not present

## 2024-01-09 DIAGNOSIS — M5459 Other low back pain: Secondary | ICD-10-CM | POA: Diagnosis not present

## 2024-01-09 DIAGNOSIS — R2689 Other abnormalities of gait and mobility: Secondary | ICD-10-CM

## 2024-01-09 DIAGNOSIS — M25562 Pain in left knee: Secondary | ICD-10-CM | POA: Diagnosis not present

## 2024-01-09 NOTE — Therapy (Signed)
 OUTPATIENT PHYSICAL THERAPY TREATMENT   Patient Name: Danielle Braun MRN: [id number] DOB:09-May-1961, 63 y.o., female Today's Date: 01/09/2024  END OF SESSION:  PT End of Session - 01/09/24 0930     Visit Number 8    Number of Visits 12    Date for PT Re-Evaluation 01/16/24    Authorization Type [facility] [employer]    PT Start Time 0930    PT Stop Time 1014    PT Time Calculation (min) 44 min    Activity Tolerance Patient tolerated treatment well    Behavior During Therapy WFL for tasks assessed/performed                 Past Medical History:  Diagnosis Date   Acne    Arrhythmia    Bursitis, subacromial    Left   Endometriosis    GERD (gastroesophageal reflux disease)    Joint pain    Palpitations    Retinal tear    Sinusitis    SOB (shortness of breath)    Vitamin D deficiency    Past Surgical History:  Procedure Laterality Date   ABDOMINAL HYSTERECTOMY     ABDOMINAL SURGERY     csection   DILATION AND CURETTAGE OF UTERUS     SHOULDER ARTHROSCOPY WITH SUBACROMIAL DECOMPRESSION Left 10/31/2017   Procedure: SHOULDER ARTHROSCOPY WITH BURSECTOMY AND SUBACROMIAL DECOMPRESSION;  Surgeon: Jones Broom, MD;  Location: [facility] OR;  Service: Orthopedics;  Laterality: Left;  Left shoulder arthroscopy bursectomy and subacromial decompression   TONSILLECTOMY     Patient Active Problem List   Diagnosis Date Noted   COVID 12/26/2023   Chronic cough 12/26/2023   Deviated nasal septum 12/26/2023   Hypertrophy of nasal turbinates 12/26/2023   Traumatic tear of meniscus of left knee 11/15/2023   Prediabetes 04/17/2023   History of colonic polyps 04/17/2023   Other fatigue 04/16/2023   SOB (shortness of breath) on exertion 04/16/2023   Health care maintenance 04/16/2023   Family history of diabetes mellitus 04/16/2023   Depression screening 04/16/2023   Generalized obesity 04/16/2023   BMI 30.0-30.9,adult 04/16/2023   Seasonal allergies 11/07/2022   Family history of  osteoporosis 11/06/2022   Postmenopause 11/06/2022   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   Obesity (BMI 30-39.9) 01/26/2022   Patellofemoral pain syndrome of right knee 01/17/2022   Freiberg's disease, right 11/09/2021   Age-related osteoporosis without current pathological fracture 11/01/2021   Chronic sinusitis, unspecified 11/01/2021   Retinal tear of left eye 07/05/2021   History of laparoscopic-assisted vaginal hysterectomy 03/31/2021   History of sacrocolpopexy 03/31/2021   Blepharitis of upper and lower eyelids of both eyes 08/23/2020   Dry eye syndrome of both lacrimal glands 08/23/2020   Nuclear sclerotic cataract of both eyes 08/23/2020   Primary refluxing megaureter 03/02/2020   Sensation of feeling hot 01/18/2020   Chronic left shoulder pain 11/11/2018   Vitamin D insufficiency 11/11/2018   Low bone mass 09/26/2017   Immune to measles 02/20/2016   Allergic rhinitis 02/06/2016   GERD (gastroesophageal reflux disease) 02/06/2016   Hyperlipemia 02/06/2016   Keratoconjunctivitis sicca of both eyes not specified as Sjogren's 02/06/2016   Myopia of both eyes with astigmatism 02/06/2016   Posterior vitreous detachment of left eye 02/06/2016   Premature atrial contraction 02/06/2016   History of right salpingo-oophorectomy 03/14/2010   Acute recurrent maxillary sinusitis 11/06/2008    PCP: Loyal Jacobson, MD  REFERRING PROVIDER: Monica Becton, MD  REFERRING DIAG: [phone] (ICD-10-CM) - Traumatic tear  of meniscus of left knee, subsequent encounter  THERAPY DIAG:  Acute pain of left knee  Muscle weakness (generalized)  Other abnormalities of gait and mobility  Rationale for Evaluation and Treatment: Rehabilitation  ONSET DATE: ~3 weeks ago  SUBJECTIVE:   SUBJECTIVE STATEMENT: "A little twinge, probably about a 1."   From eval: Pt states she's had issues with both legs in the last 2 years. Pt was having L hip and leg pain in December. Pt was  walking and her knee gave way ~3 weeks ago. Pt states she has been able to do her 12 hour shifts and be careful. Pt reports pain was not so bad she needed an injection.   PERTINENT HISTORY: Frieburgs disease R foot (AVN of metatarsal), OA R knee   PAIN:  Are you having pain? Yes: NPRS scale: 1/10  Pain location: Medial LT knee Pain description: twinge Aggravating factors: end of 12 hour shift, twisting Relieving factors: tylenol, ice and knee brace  PRECAUTIONS: None  RED FLAGS: None   WEIGHT BEARING RESTRICTIONS: No  FALLS:  Has patient fallen in last 6 months? No  LIVING ENVIRONMENT: Lives with: lives with their spouse Lives in: House/apartment Stairs: Yes: External: 2 steps; none Has following equipment at home: None  OCCUPATION: ICU nurse at [employer]  PATIENT GOALS: Return to normal activities (pt bikes and rows x20 min each)  NEXT MD VISIT: As needed after PT  OBJECTIVE:  Note: Objective measures were completed at Evaluation unless otherwise noted.  DIAGNOSTIC FINDINGS: MRI 11/19/23 L knee IMPRESSION: 1. Complete tear at the root of the posterior horn of the medial meniscus, with near absence of meniscal tissue within the 3 mm of the posterior horn closest to the root. Associated mild extrusion of the body of the medial meniscus. 2. Tricompartmental cartilage degenerative changes, severe within the lateral patellofemoral cartilage. 3. Small joint effusion.  PATIENT SURVEYS:  Lower Extremity Functional Score: 47 / 80 = 58.8 %  EDEMA:  "A little edema"  MUSCLE LENGTH: Hamstrings: Left slightly tighter than Right Thomas test: Did not assess  PALPATION: Mostly tender about medial L knee joint line and along edema around medial patella  LOWER EXTREMITY ROM:  Active ROM Right eval Left eval 12/31/23   Hip flexion     Hip extension     Hip abduction     Hip adduction     Hip internal rotation     Hip external rotation     Knee flexion 125 125 Lt:  140; Rt: 140   Knee extension 0 -5 Lt: 0; Rt: 0   Ankle dorsiflexion     Ankle plantarflexion     Ankle inversion     Ankle eversion      (Blank rows = not tested)  LOWER EXTREMITY MMT:  MMT Right eval Left eval  Hip flexion 5 4*  Hip extension 4 3-  Hip abduction 5 3+  Hip adduction 5 3+  Hip internal rotation    Hip external rotation    Knee flexion 5 5  Knee extension 5 5  Ankle dorsiflexion    Ankle plantarflexion    Ankle inversion    Ankle eversion     (Blank rows = not tested, * = pain)  LOWER EXTREMITY SPECIAL TESTS:  Hip special tests: Luisa Hart (FABER) test: negative and Craig's test: negative  FUNCTIONAL TESTS:  5x STS: 15.92 sec SLS: R 20 sec, L 19 sec  GAIT: Distance walked: Into clinic Assistive device utilized: L  knee brace Level of assistance: Complete Independence Comments: Mildly antalgic with L knee slightly kept in flexion [facility] Adult PT Treatment:                                                DATE: 01/09/24 Therapeutic Exercise: NuStep level 6 x 5 minutes UE/LE  LAQ 2 x 10 @ 3 lbs   Neuromuscular re-ed: SLR square trace 2 x 10  Hip bridge with posterior pelvic tilt 2 x 10  Bosu balance normal stance 3 x 30 sec  Airex balance beam side steps 3 sets d/b  Therapeutic Activity: Hip hinge to 6 inch step with 5 lb kettlebell x 10  Mini reverse lunge x 10 each     [facility] Adult PT Treatment:                                                DATE: 01/07/24 Therapeutic Exercise: NuStep level 6 x 5 minutes UE/LE   Neuromuscular re-ed: Step up 4 inch + airex; x 10 step up x 10 step up knee driver  Resisted backward walking 10 lbs 2 x 10  Romberg ball toss to rebounder x 10; x 10 normal stance on airex  Therapeutic Activity: SL leg press 40 lbs 2 x 10  Hip hinge 5 lb kettlebell to 6 inch step x 10     [facility] Adult PT Treatment:                                                DATE: 01/02/24 Therapeutic Exercise: NuStep level 6 x 5 minutes UE/LE    Neuromuscular re-ed: SLR 2 x 15  Sidelying hip abduction 2 x 15  Therapeutic Activity: Leg press with black resistance bands at thighs for gluteal activation 2 x 15  Step up knee driver, 6 inch step 2 x 15   Self Care: Begin to wean from brace for community activity, still wear at work.           PATIENT EDUCATION:  Education details: HEP review Person educated: Patient Education method: Explanation Education comprehension: verbalized understanding  HOME EXERCISE PROGRAM: Access Code: [email or web address] URL: [email or web address] Date: 12/31/2023 Prepared by: Letitia Libra  Exercises - Runner's Step Up/Down  - 1 x daily - 7 x weekly - 2 sets - 10 reps - Bridge with Hip Abduction and Resistance  - 1 x daily - 7 x weekly - 2 sets - 10 reps - Forward Step Down Touch with Heel  - 1 x daily - 7 x weekly - 2 sets - 10 reps - The Diver  - 1 x daily - 7 x weekly - 2 sets - 10 reps - Lunge Matrix on Slider  - 1 x daily - 7 x weekly - 2 sets - 10 reps - Standing 3-way Hip with Walker  - 1 x daily - 7 x weekly - 2 sets - 10 reps  ASSESSMENT:  CLINICAL IMPRESSION: Patient arrives with mild Lt knee pain. Focused on functional strengthening and balance activity on unstable surface without an increase in knee pain reported.  Minimal sway noted with balance activity on BOSU and airex balance beam. Introduced lunge activity today with patient demonstrating proper form through partial range.   From eval: Patient is a 63 y.o. F who was seen today for physical therapy evaluation and treatment for L knee meniscus tear. PMH significant for Frieburg's on R foot. Assessment demos generally good L knee quad strength; however, decreased lateral and medial knee stability with her pain most notable during twisting/rotary motions. Pt demos decreased hip adductor, abd and glute/extensor strength affecting her tolerance for prolonged standing and walking tasks. Pt will benefit from PT to address these  deficits for return to normal function.   OBJECTIVE IMPAIRMENTS: Abnormal gait, decreased activity tolerance, decreased endurance, decreased mobility, difficulty walking, decreased ROM, decreased strength, increased edema, increased fascial restrictions, improper body mechanics, and pain.     GOALS: Goals reviewed with patient? Yes  SHORT TERM GOALS: Target date: 12/26/2023  Pt will be ind with initial HEP Baseline: Goal status: MET  2.  Pt will demo L = R knee ROM Baseline:  Goal status: MET   LONG TERM GOALS: Target date: 01/16/2024   Pt will be ind with management and progression of HEP for full return to her normal gym activities (I.e. rowing and biking) Baseline:  Goal status: INITIAL  2.  Pt will report >/=75% improvement in her overall knee pain with activity Baseline:  Goal status: INITIAL  3.  Pt will demo improved 5x STS to </=10 sec for increased functional LE strength Baseline:  Goal status: INITIAL  4.  Pt will demo at least 4+/5 hip abductor and adductor strength for improved knee stability Baseline:  Goal status: INITIAL  5.  Pt will have improved LEFS to >/=56/80 to demo MCID Baseline:  Goal status: INITIAL    PLAN:  PT FREQUENCY: 2x/week  PT DURATION: 6 weeks  PLANNED INTERVENTIONS: 97164- PT Re-evaluation, 97110-Therapeutic exercises, 97530- Therapeutic activity, 97112- Neuromuscular re-education, 97535- Self Care, 16109- Manual therapy, [id number]- Gait training, [phone]- Aquatic Therapy, 97014- Electrical stimulation (unattended), 97016- Vasopneumatic device, [id number]- Ultrasound, 09811- Ionotophoresis 4mg /ml Dexamethasone, Patient/Family education, Balance training, Stair training, Taping, Dry Needling, Joint mobilization, Cryotherapy, and Moist heat  PLAN FOR NEXT SESSION: Progress HEP. Continue hip strengthening and knee strengthening for medial/lateral knee stability. Functional strengthening and dynamic balance activity.   Letitia Libra, PT, DPT,  ATC 01/09/24 10:15 AM

## 2024-01-14 ENCOUNTER — Ambulatory Visit

## 2024-01-14 DIAGNOSIS — K219 Gastro-esophageal reflux disease without esophagitis: Secondary | ICD-10-CM | POA: Diagnosis not present

## 2024-01-14 DIAGNOSIS — M6281 Muscle weakness (generalized): Secondary | ICD-10-CM

## 2024-01-14 DIAGNOSIS — Z780 Asymptomatic menopausal state: Secondary | ICD-10-CM | POA: Diagnosis not present

## 2024-01-14 DIAGNOSIS — Z8262 Family history of osteoporosis: Secondary | ICD-10-CM | POA: Diagnosis not present

## 2024-01-14 DIAGNOSIS — M25562 Pain in left knee: Secondary | ICD-10-CM

## 2024-01-14 DIAGNOSIS — R2689 Other abnormalities of gait and mobility: Secondary | ICD-10-CM

## 2024-01-14 DIAGNOSIS — M8580 Other specified disorders of bone density and structure, unspecified site: Secondary | ICD-10-CM | POA: Diagnosis not present

## 2024-01-14 DIAGNOSIS — M5459 Other low back pain: Secondary | ICD-10-CM | POA: Diagnosis not present

## 2024-01-14 NOTE — Therapy (Signed)
 OUTPATIENT PHYSICAL THERAPY TREATMENT   Patient Name: Danielle Braun MRN: [id number] DOB:[birth year], 62 y.o., female Today's Date: 01/14/2024  END OF SESSION:  PT End of Session - 01/14/24 0846     Visit Number 9    Number of Visits 12    Date for PT Re-Evaluation 01/16/24    Authorization Type [employer]    PT Start Time [phone]    PT Stop Time 0929    PT Time Calculation (min) 43 min    Activity Tolerance Patient tolerated treatment well    Behavior During Therapy WFL for tasks assessed/performed                  Past Medical History:  Diagnosis Date   Acne    Arrhythmia    Bursitis, subacromial    Left   Endometriosis    GERD (gastroesophageal reflux disease)    Joint pain    Palpitations    Retinal tear    Sinusitis    SOB (shortness of breath)    Vitamin D deficiency    Past Surgical History:  Procedure Laterality Date   ABDOMINAL HYSTERECTOMY     ABDOMINAL SURGERY     csection   DILATION AND CURETTAGE OF UTERUS     SHOULDER ARTHROSCOPY WITH SUBACROMIAL DECOMPRESSION Left 10/31/2017   Procedure: SHOULDER ARTHROSCOPY WITH BURSECTOMY AND SUBACROMIAL DECOMPRESSION;  Surgeon: Jones Broom, MD;  Location: [facility] OR;  Service: Orthopedics;  Laterality: Left;  Left shoulder arthroscopy bursectomy and subacromial decompression   TONSILLECTOMY     Patient Active Problem List   Diagnosis Date Noted   COVID 12/26/2023   Chronic cough 12/26/2023   Deviated nasal septum 12/26/2023   Hypertrophy of nasal turbinates 12/26/2023   Traumatic tear of meniscus of left knee 11/15/2023   Prediabetes 04/17/2023   History of colonic polyps 04/17/2023   Other fatigue 04/16/2023   SOB (shortness of breath) on exertion 04/16/2023   Health care maintenance 04/16/2023   Family history of diabetes mellitus 04/16/2023   Depression screening 04/16/2023   Generalized obesity 04/16/2023   BMI 30.0-30.9,adult 04/16/2023   Seasonal allergies 11/07/2022   Family history of  osteoporosis 11/06/2022   Postmenopause 11/06/2022   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   Obesity (BMI 30-39.9) 01/26/2022   Patellofemoral pain syndrome of right knee 01/17/2022   Freiberg's disease, right 11/09/2021   Age-related osteoporosis without current pathological fracture 11/01/2021   Chronic sinusitis, unspecified 11/01/2021   Retinal tear of left eye 07/05/2021   History of laparoscopic-assisted vaginal hysterectomy 03/31/2021   History of sacrocolpopexy 03/31/2021   Blepharitis of upper and lower eyelids of both eyes 08/23/2020   Dry eye syndrome of both lacrimal glands 08/23/2020   Nuclear sclerotic cataract of both eyes 08/23/2020   Primary refluxing megaureter 03/02/2020   Sensation of feeling hot 01/18/2020   Chronic left shoulder pain 11/11/2018   Vitamin D insufficiency 11/11/2018   Low bone mass 09/26/2017   Immune to measles 02/20/2016   Allergic rhinitis 02/06/2016   GERD (gastroesophageal reflux disease) 02/06/2016   Hyperlipemia 02/06/2016   Keratoconjunctivitis sicca of both eyes not specified as Sjogren's 02/06/2016   Myopia of both eyes with astigmatism 02/06/2016   Posterior vitreous detachment of left eye 02/06/2016   Premature atrial contraction 02/06/2016   History of right salpingo-oophorectomy 03/14/2010   Acute recurrent maxillary sinusitis 11/06/2008    PCP: Loyal Jacobson, MD  REFERRING PROVIDER: Monica Becton, MD  REFERRING DIAG: [phone] (ICD-10-CM) - Traumatic  tear of meniscus of left knee, subsequent encounter  THERAPY DIAG:  Acute pain of left knee  Muscle weakness (generalized)  Other abnormalities of gait and mobility  Rationale for Evaluation and Treatment: Rehabilitation  ONSET DATE: ~3 weeks ago  SUBJECTIVE:   SUBJECTIVE STATEMENT: Patient reports she felt a big twinge in her back this morning. The knee was about a 3 yesterday off/on. Today it is a 2, but not all the time.   From eval: Pt states  she's had issues with both legs in the last 2 years. Pt was having L hip and leg pain in December. Pt was walking and her knee gave way ~3 weeks ago. Pt states she has been able to do her 12 hour shifts and be careful. Pt reports pain was not so bad she needed an injection.   PERTINENT HISTORY: Frieburgs disease R foot (AVN of metatarsal), OA R knee   PAIN:  Are you having pain? Yes: NPRS scale: 2/10  Pain location: Medial LT knee Pain description: twinge Aggravating factors: end of 12 hour shift, twisting Relieving factors: tylenol, ice and knee brace  PRECAUTIONS: None  RED FLAGS: None   WEIGHT BEARING RESTRICTIONS: No  FALLS:  Has patient fallen in last 6 months? No  LIVING ENVIRONMENT: Lives with: lives with their spouse Lives in: House/apartment Stairs: Yes: External: 2 steps; none Has following equipment at home: None  OCCUPATION: ICU nurse at [employer]  PATIENT GOALS: Return to normal activities (pt bikes and rows x20 min each)  NEXT MD VISIT: As needed after PT  OBJECTIVE:  Note: Objective measures were completed at Evaluation unless otherwise noted.  DIAGNOSTIC FINDINGS: MRI 11/19/23 L knee IMPRESSION: 1. Complete tear at the root of the posterior horn of the medial meniscus, with near absence of meniscal tissue within the 3 mm of the posterior horn closest to the root. Associated mild extrusion of the body of the medial meniscus. 2. Tricompartmental cartilage degenerative changes, severe within the lateral patellofemoral cartilage. 3. Small joint effusion.  PATIENT SURVEYS:  Lower Extremity Functional Score: 47 / 80 = 58.8 %  EDEMA:  "A little edema"  MUSCLE LENGTH: Hamstrings: Left slightly tighter than Right Thomas test: Did not assess  PALPATION: Mostly tender about medial L knee joint line and along edema around medial patella  LOWER EXTREMITY ROM:  Active ROM Right eval Left eval 12/31/23   Hip flexion     Hip extension     Hip  abduction     Hip adduction     Hip internal rotation     Hip external rotation     Knee flexion 125 125 Lt: 140; Rt: 140   Knee extension 0 -5 Lt: 0; Rt: 0   Ankle dorsiflexion     Ankle plantarflexion     Ankle inversion     Ankle eversion      (Blank rows = not tested)  LOWER EXTREMITY MMT:  MMT Right eval Left eval  Hip flexion 5 4*  Hip extension 4 3-  Hip abduction 5 3+  Hip adduction 5 3+  Hip internal rotation    Hip external rotation    Knee flexion 5 5  Knee extension 5 5  Ankle dorsiflexion    Ankle plantarflexion    Ankle inversion    Ankle eversion     (Blank rows = not tested, * = pain)  LOWER EXTREMITY SPECIAL TESTS:  Hip special tests: Luisa Hart (FABER) test: negative and Craig's test: negative  FUNCTIONAL TESTS:  5x STS: 15.92 sec SLS: R 20 sec, L 19 sec  GAIT: Distance walked: Into clinic Assistive device utilized: L knee brace Level of assistance: Complete Independence Comments: Mildly antalgic with L knee slightly kept in flexion [facility] Adult PT Treatment:                                                DATE: 01/14/24 Therapeutic Exercise: NuStep level 6 x 5 minutes UE/LE  Lateral,forward, and backward band walks 2 x 15 ft Updated HEP   Neuromuscular re-ed: 3 way hip on airex 2 x 10  Squat on airex 2 x 10  March on airex 2 x 10  Opposite touchdown to chair 2 x 10   Self Care: Weaning from brace at work Recommendation to f/u with MD regarding back pain    [facility] Adult PT Treatment:                                                DATE: 01/09/24 Therapeutic Exercise: NuStep level 6 x 5 minutes UE/LE  LAQ 2 x 10 @ 3 lbs   Neuromuscular re-ed: SLR square trace 2 x 10  Hip bridge with posterior pelvic tilt 2 x 10  Bosu balance normal stance 3 x 30 sec  Airex balance beam side steps 3 sets d/b  Therapeutic Activity: Hip hinge to 6 inch step with 5 lb kettlebell x 10  Mini reverse lunge x 10 each     [facility] Adult PT Treatment:                                                 DATE: 01/07/24 Therapeutic Exercise: NuStep level 6 x 5 minutes UE/LE   Neuromuscular re-ed: Step up 4 inch + airex; x 10 step up x 10 step up knee driver  Resisted backward walking 10 lbs 2 x 10  Romberg ball toss to rebounder x 10; x 10 normal stance on airex  Therapeutic Activity: SL leg press 40 lbs 2 x 10  Hip hinge 5 lb kettlebell to 6 inch step x 10     [facility] Adult PT Treatment:                                                DATE: 01/02/24 Therapeutic Exercise: NuStep level 6 x 5 minutes UE/LE   Neuromuscular re-ed: SLR 2 x 15  Sidelying hip abduction 2 x 15  Therapeutic Activity: Leg press with black resistance bands at thighs for gluteal activation 2 x 15  Step up knee driver, 6 inch step 2 x 15   Self Care: Begin to wean from brace for community activity, still wear at work.           PATIENT EDUCATION:  Education details: HEP update Person educated: Patient Education method: Explanation, demo, cues, handout Education comprehension: verbalized understanding, returned demo   HOME EXERCISE PROGRAM: Access Code: [email or web address] URL: [email or web address] Date:  01/14/2024 Prepared by: Letitia Libra  Exercises - Runner's Step Up/Down  - 1 x daily - 7 x weekly - 2 sets - 10 reps - Bridge with Hip Abduction and Resistance  - 1 x daily - 7 x weekly - 2 sets - 10 reps - Forward Step Down Touch with Heel  - 1 x daily - 7 x weekly - 2 sets - 10 reps - The Diver  - 1 x daily - 7 x weekly - 2 sets - 10 reps - Lunge Matrix on Slider  - 1 x daily - 7 x weekly - 2 sets - 10 reps - Standing 3-way Hip with Walker  - 1 x daily - 7 x weekly - 2 sets - 10 reps - Side Stepping with Resistance at Ankles  - 1 x daily - 7 x weekly - 2 sets - 10 reps - Backward Band Walks with Resistance at Thighs and Ankles  - 1 x daily - 7 x weekly - 2 sets - 10 reps - Forward Band Walks with Resistance at Thighs and Ankles  - 1 x daily - 7 x weekly - 2 sets - 10  reps  ASSESSMENT:  CLINICAL IMPRESSION: Patient arrives with mild Lt knee pain, but continues to endorse intermittent low back pain as well. Was recommended to f/u with MD regarding ongoing back pain with patient verbalizing understanding. Focused on progression of standing hip and knee strengthening with good tolerance. She demonstrates good control and form with squatting on unstable surface without an increase in pain. Challenged with SL balance activity with moderate sway noted.   From eval: Patient is a 63 y.o. F who was seen today for physical therapy evaluation and treatment for L knee meniscus tear. PMH significant for Frieburg's on R foot. Assessment demos generally good L knee quad strength; however, decreased lateral and medial knee stability with her pain most notable during twisting/rotary motions. Pt demos decreased hip adductor, abd and glute/extensor strength affecting her tolerance for prolonged standing and walking tasks. Pt will benefit from PT to address these deficits for return to normal function.   OBJECTIVE IMPAIRMENTS: Abnormal gait, decreased activity tolerance, decreased endurance, decreased mobility, difficulty walking, decreased ROM, decreased strength, increased edema, increased fascial restrictions, improper body mechanics, and pain.     GOALS: Goals reviewed with patient? Yes  SHORT TERM GOALS: Target date: 12/26/2023  Pt will be ind with initial HEP Baseline: Goal status: MET  2.  Pt will demo L = R knee ROM Baseline:  Goal status: MET   LONG TERM GOALS: Target date: 01/16/2024   Pt will be ind with management and progression of HEP for full return to her normal gym activities (I.e. rowing and biking) Baseline:  Goal status: INITIAL  2.  Pt will report >/=75% improvement in her overall knee pain with activity Baseline:  Goal status: INITIAL  3.  Pt will demo improved 5x STS to </=10 sec for increased functional LE strength Baseline:  Goal status:  INITIAL  4.  Pt will demo at least 4+/5 hip abductor and adductor strength for improved knee stability Baseline:  Goal status: INITIAL  5.  Pt will have improved LEFS to >/=56/80 to demo MCID Baseline:  Goal status: INITIAL    PLAN:  PT FREQUENCY: 2x/week  PT DURATION: 6 weeks  PLANNED INTERVENTIONS: 97164- PT Re-evaluation, 97110-Therapeutic exercises, 97530- Therapeutic activity, 97112- Neuromuscular re-education, 97535- Self Care, 16109- Manual therapy, [id number]- Gait training, [id number]- Aquatic Therapy, 97014- Electrical stimulation (unattended),  40981- Vasopneumatic device, [id number]- Ultrasound, 19147- Ionotophoresis 4mg /ml Dexamethasone, Patient/Family education, Balance training, Stair training, Taping, Dry Needling, Joint mobilization, Cryotherapy, and Moist heat  PLAN FOR NEXT SESSION: Progress HEP. Continue hip strengthening and knee strengthening for medial/lateral knee stability. Functional strengthening and dynamic balance activity.   Letitia Libra, PT, DPT, ATC 01/14/24 9:30 AM

## 2024-01-16 ENCOUNTER — Ambulatory Visit

## 2024-01-16 DIAGNOSIS — M25562 Pain in left knee: Secondary | ICD-10-CM | POA: Diagnosis not present

## 2024-01-16 DIAGNOSIS — R2689 Other abnormalities of gait and mobility: Secondary | ICD-10-CM

## 2024-01-16 DIAGNOSIS — M6281 Muscle weakness (generalized): Secondary | ICD-10-CM

## 2024-01-16 DIAGNOSIS — M5459 Other low back pain: Secondary | ICD-10-CM | POA: Diagnosis not present

## 2024-01-16 NOTE — Therapy (Signed)
 OUTPATIENT PHYSICAL THERAPY TREATMENT   Patient Name: Danielle Braun MRN: [id number] DOB:1961-03-12, 63 y.o., female Today's Date: 01/16/2024  END OF SESSION:  PT End of Session - 01/16/24 0934     Visit Number 10    Number of Visits 12    Date for PT Re-Evaluation 01/16/24    Authorization Type [facility] [employer]    PT Start Time [phone]    PT Stop Time 1015    PT Time Calculation (min) 42 min    Activity Tolerance Patient tolerated treatment well    Behavior During Therapy WFL for tasks assessed/performed                   Past Medical History:  Diagnosis Date   Acne    Arrhythmia    Bursitis, subacromial    Left   Endometriosis    GERD (gastroesophageal reflux disease)    Joint pain    Palpitations    Retinal tear    Sinusitis    SOB (shortness of breath)    Vitamin D deficiency    Past Surgical History:  Procedure Laterality Date   ABDOMINAL HYSTERECTOMY     ABDOMINAL SURGERY     csection   DILATION AND CURETTAGE OF UTERUS     SHOULDER ARTHROSCOPY WITH SUBACROMIAL DECOMPRESSION Left 10/31/2017   Procedure: SHOULDER ARTHROSCOPY WITH BURSECTOMY AND SUBACROMIAL DECOMPRESSION;  Surgeon: Jones Broom, MD;  Location: [facility] OR;  Service: Orthopedics;  Laterality: Left;  Left shoulder arthroscopy bursectomy and subacromial decompression   TONSILLECTOMY     Patient Active Problem List   Diagnosis Date Noted   COVID 12/26/2023   Chronic cough 12/26/2023   Deviated nasal septum 12/26/2023   Hypertrophy of nasal turbinates 12/26/2023   Traumatic tear of meniscus of left knee 11/15/2023   Prediabetes 04/17/2023   History of colonic polyps 04/17/2023   Other fatigue 04/16/2023   SOB (shortness of breath) on exertion 04/16/2023   Health care maintenance 04/16/2023   Family history of diabetes mellitus 04/16/2023   Depression screening 04/16/2023   Generalized obesity 04/16/2023   BMI 30.0-30.9,adult 04/16/2023   Seasonal allergies 11/07/2022   Family history  of osteoporosis 11/06/2022   Postmenopause 11/06/2022   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   Obesity (BMI 30-39.9) 01/26/2022   Patellofemoral pain syndrome of right knee 01/17/2022   Freiberg's disease, right 11/09/2021   Age-related osteoporosis without current pathological fracture 11/01/2021   Chronic sinusitis, unspecified 11/01/2021   Retinal tear of left eye 07/05/2021   History of laparoscopic-assisted vaginal hysterectomy 03/31/2021   History of sacrocolpopexy 03/31/2021   Blepharitis of upper and lower eyelids of both eyes 08/23/2020   Dry eye syndrome of both lacrimal glands 08/23/2020   Nuclear sclerotic cataract of both eyes 08/23/2020   Primary refluxing megaureter 03/02/2020   Sensation of feeling hot 01/18/2020   Chronic left shoulder pain 11/11/2018   Vitamin D insufficiency 11/11/2018   Low bone mass 09/26/2017   Immune to measles 02/20/2016   Allergic rhinitis 02/06/2016   GERD (gastroesophageal reflux disease) 02/06/2016   Hyperlipemia 02/06/2016   Keratoconjunctivitis sicca of both eyes not specified as Sjogren's 02/06/2016   Myopia of both eyes with astigmatism 02/06/2016   Posterior vitreous detachment of left eye 02/06/2016   Premature atrial contraction 02/06/2016   History of right salpingo-oophorectomy 03/14/2010   Acute recurrent maxillary sinusitis 11/06/2008    PCP: Loyal Jacobson, MD  REFERRING PROVIDER: Monica Becton, MD  REFERRING DIAG: [phone] (ICD-10-CM) -  Traumatic tear of meniscus of left knee, subsequent encounter  THERAPY DIAG:  Acute pain of left knee  Muscle weakness (generalized)  Other abnormalities of gait and mobility  Rationale for Evaluation and Treatment: Rehabilitation  ONSET DATE: ~3 weeks ago  SUBJECTIVE:   SUBJECTIVE STATEMENT: Patient was able to schedule appointment with Dr. Karie Schwalbe mostly for the back pain. Patient reports the knee is feeling pretty good.   From eval: Pt states she's had issues  with both legs in the last 2 years. Pt was having L hip and leg pain in December. Pt was walking and her knee gave way ~3 weeks ago. Pt states she has been able to do her 12 hour shifts and be careful. Pt reports pain was not so bad she needed an injection.   PERTINENT HISTORY: Frieburgs disease R foot (AVN of metatarsal), OA R knee   PAIN:  Are you having pain? Yes: NPRS scale: none currently; at worst 1-2/10  Pain location: Medial LT knee Pain description: twinge Aggravating factors: end of 12 hour shift, twisting Relieving factors: tylenol, ice and knee brace  PRECAUTIONS: None  RED FLAGS: None   WEIGHT BEARING RESTRICTIONS: No  FALLS:  Has patient fallen in last 6 months? No  LIVING ENVIRONMENT: Lives with: lives with their spouse Lives in: House/apartment Stairs: Yes: External: 2 steps; none Has following equipment at home: None  OCCUPATION: ICU nurse at [employer]  PATIENT GOALS: Return to normal activities (pt bikes and rows x20 min each)  NEXT MD VISIT: As needed after PT  OBJECTIVE:  Note: Objective measures were completed at Evaluation unless otherwise noted.  DIAGNOSTIC FINDINGS: MRI 11/19/23 L knee IMPRESSION: 1. Complete tear at the root of the posterior horn of the medial meniscus, with near absence of meniscal tissue within the 3 mm of the posterior horn closest to the root. Associated mild extrusion of the body of the medial meniscus. 2. Tricompartmental cartilage degenerative changes, severe within the lateral patellofemoral cartilage. 3. Small joint effusion.  PATIENT SURVEYS:  Lower Extremity Functional Score: 47 / 80 = 58.8 %  EDEMA:  "A little edema"  MUSCLE LENGTH: Hamstrings: Left slightly tighter than Right Thomas test: Did not assess  PALPATION: Mostly tender about medial L knee joint line and along edema around medial patella  LOWER EXTREMITY ROM:  Active ROM Right eval Left eval 12/31/23   Hip flexion     Hip extension      Hip abduction     Hip adduction     Hip internal rotation     Hip external rotation     Knee flexion 125 125 Lt: 140; Rt: 140   Knee extension 0 -5 Lt: 0; Rt: 0   Ankle dorsiflexion     Ankle plantarflexion     Ankle inversion     Ankle eversion      (Blank rows = not tested)  LOWER EXTREMITY MMT:  MMT Right eval Left eval  Hip flexion 5 4*  Hip extension 4 3-  Hip abduction 5 3+  Hip adduction 5 3+  Hip internal rotation    Hip external rotation    Knee flexion 5 5  Knee extension 5 5  Ankle dorsiflexion    Ankle plantarflexion    Ankle inversion    Ankle eversion     (Blank rows = not tested, * = pain)  LOWER EXTREMITY SPECIAL TESTS:  Hip special tests: Luisa Hart (FABER) test: negative and Craig's test: negative  FUNCTIONAL TESTS:  5x STS: 15.92 sec SLS: R 20 sec, L 19 sec  GAIT: Distance walked: Into clinic Assistive device utilized: L knee brace Level of assistance: Complete Independence Comments: Mildly antalgic with L knee slightly kept in flexion [facility] Adult PT Treatment:                                                DATE: 01/16/24 Therapeutic Exercise: Recumbent bike level 1 x 5 minutes   Neuromuscular re-ed: Opposite touchdown to chair 2 x 10  Med ball trunk rotation on airex 2 x 10 each; semi-tandem stance  Calf raise on airex 2 x 10  Semi-tandem on airex with kettlebell pass 2 x 10  Tandem walks 2 x 15 ft each fwd/bwd  Therapeutic Activity: Sit to stand 10 lb kettlebell 2 x 10  Leg press 2 x 15 @65  lbs    [facility] Adult PT Treatment:                                                DATE: 01/14/24 Therapeutic Exercise: NuStep level 6 x 5 minutes UE/LE  Lateral,forward, and backward band walks 2 x 15 ft Updated HEP   Neuromuscular re-ed: 3 way hip on airex 2 x 10  Squat on airex 2 x 10  March on airex 2 x 10  Opposite touchdown to chair 2 x 10   Self Care: Weaning from brace at work Recommendation to f/u with MD regarding back pain    [facility]  Adult PT Treatment:                                                DATE: 01/09/24 Therapeutic Exercise: NuStep level 6 x 5 minutes UE/LE  LAQ 2 x 10 @ 3 lbs   Neuromuscular re-ed: SLR square trace 2 x 10  Hip bridge with posterior pelvic tilt 2 x 10  Bosu balance normal stance 3 x 30 sec  Airex balance beam side steps 3 sets d/b  Therapeutic Activity: Hip hinge to 6 inch step with 5 lb kettlebell x 10  Mini reverse lunge x 10 each     [facility] Adult PT Treatment:                                                DATE: 01/07/24 Therapeutic Exercise: NuStep level 6 x 5 minutes UE/LE   Neuromuscular re-ed: Step up 4 inch + airex; x 10 step up x 10 step up knee driver  Resisted backward walking 10 lbs 2 x 10  Romberg ball toss to rebounder x 10; x 10 normal stance on airex  Therapeutic Activity: SL leg press 40 lbs 2 x 10  Hip hinge 5 lb kettlebell to 6 inch step x 10             PATIENT EDUCATION:  Education details: HEP review Person educated: Patient Education method: Programmer, multimedia,  Education comprehension: verbalized understanding,   HOME EXERCISE PROGRAM:  Access Code: [email or web address] URL: [email or web address] Date: 01/14/2024 Prepared by: Letitia Libra  Exercises - Runner's Step Up/Down  - 1 x daily - 7 x weekly - 2 sets - 10 reps - Bridge with Hip Abduction and Resistance  - 1 x daily - 7 x weekly - 2 sets - 10 reps - Forward Step Down Touch with Heel  - 1 x daily - 7 x weekly - 2 sets - 10 reps - The Diver  - 1 x daily - 7 x weekly - 2 sets - 10 reps - Lunge Matrix on Slider  - 1 x daily - 7 x weekly - 2 sets - 10 reps - Standing 3-way Hip with Walker  - 1 x daily - 7 x weekly - 2 sets - 10 reps - Side Stepping with Resistance at Ankles  - 1 x daily - 7 x weekly - 2 sets - 10 reps - Backward Band Walks with Resistance at Thighs and Ankles  - 1 x daily - 7 x weekly - 2 sets - 10 reps - Forward Band Walks with Resistance at Thighs and Ankles  - 1 x daily - 7 x  weekly - 2 sets - 10 reps  ASSESSMENT:  CLINICAL IMPRESSION: Patient arrives without reports of knee. Has f/u with referring provider next week mostly for ongoing acute low back pain. Focused on LE strength and balance progression on unstable surface without onset of knee pain. She demonstrates good control/form with squatting activity. She reported slight pulling in the back with kettlebell passes, but otherwise no complaints of pain throughout session.   From eval: Patient is a 63 y.o. F who was seen today for physical therapy evaluation and treatment for L knee meniscus tear. PMH significant for Frieburg's on R foot. Assessment demos generally good L knee quad strength; however, decreased lateral and medial knee stability with her pain most notable during twisting/rotary motions. Pt demos decreased hip adductor, abd and glute/extensor strength affecting her tolerance for prolonged standing and walking tasks. Pt will benefit from PT to address these deficits for return to normal function.   OBJECTIVE IMPAIRMENTS: Abnormal gait, decreased activity tolerance, decreased endurance, decreased mobility, difficulty walking, decreased ROM, decreased strength, increased edema, increased fascial restrictions, improper body mechanics, and pain.     GOALS: Goals reviewed with patient? Yes  SHORT TERM GOALS: Target date: 12/26/2023  Pt will be ind with initial HEP Baseline: Goal status: MET  2.  Pt will demo L = R knee ROM Baseline:  Goal status: MET   LONG TERM GOALS: Target date: 01/16/2024   Pt will be ind with management and progression of HEP for full return to her normal gym activities (I.e. rowing and biking) Baseline:  Goal status: INITIAL  2.  Pt will report >/=75% improvement in her overall knee pain with activity Baseline:  Goal status: INITIAL  3.  Pt will demo improved 5x STS to </=10 sec for increased functional LE strength Baseline:  Goal status: INITIAL  4.  Pt will demo  at least 4+/5 hip abductor and adductor strength for improved knee stability Baseline:  Goal status: INITIAL  5.  Pt will have improved LEFS to >/=56/80 to demo MCID Baseline:  Goal status: INITIAL    PLAN:  PT FREQUENCY: 2x/week  PT DURATION: 6 weeks  PLANNED INTERVENTIONS: 97164- PT Re-evaluation, 97110-Therapeutic exercises, 97530- Therapeutic activity, [id number]- Neuromuscular re-education, 97535- Self Care, 95621- Manual therapy, [id number]- Gait training, [id number]- Aquatic Therapy, 97014- Electrical stimulation (unattended), 97016- Vasopneumatic  device, [id number]- Ultrasound, 40981- Ionotophoresis 4mg /ml Dexamethasone, Patient/Family education, Balance training, Stair training, Taping, Dry Needling, Joint mobilization, Cryotherapy, and Moist heat  PLAN FOR NEXT SESSION: Progress HEP. Continue hip strengthening and knee strengthening for medial/lateral knee stability. Functional strengthening and dynamic balance activity. RE-EVAL.   Letitia Libra, PT, DPT, ATC 01/16/24 10:25 AM

## 2024-01-17 ENCOUNTER — Other Ambulatory Visit (HOSPITAL_COMMUNITY): Payer: Self-pay

## 2024-01-18 ENCOUNTER — Other Ambulatory Visit (HOSPITAL_COMMUNITY): Payer: Self-pay

## 2024-01-21 ENCOUNTER — Encounter

## 2024-01-21 ENCOUNTER — Ambulatory Visit: Admitting: Sports Medicine

## 2024-01-21 ENCOUNTER — Ambulatory Visit

## 2024-01-21 ENCOUNTER — Encounter: Payer: Self-pay | Admitting: Sports Medicine

## 2024-01-21 DIAGNOSIS — M47816 Spondylosis without myelopathy or radiculopathy, lumbar region: Secondary | ICD-10-CM | POA: Diagnosis not present

## 2024-01-21 DIAGNOSIS — M4316 Spondylolisthesis, lumbar region: Secondary | ICD-10-CM | POA: Diagnosis not present

## 2024-01-21 DIAGNOSIS — M438X6 Other specified deforming dorsopathies, lumbar region: Secondary | ICD-10-CM | POA: Diagnosis not present

## 2024-01-21 DIAGNOSIS — M549 Dorsalgia, unspecified: Secondary | ICD-10-CM | POA: Diagnosis not present

## 2024-01-21 DIAGNOSIS — S83207D Unspecified tear of unspecified meniscus, current injury, left knee, subsequent encounter: Secondary | ICD-10-CM

## 2024-01-21 DIAGNOSIS — M545 Low back pain, unspecified: Secondary | ICD-10-CM | POA: Insufficient documentation

## 2024-01-21 DIAGNOSIS — M419 Scoliosis, unspecified: Secondary | ICD-10-CM | POA: Diagnosis not present

## 2024-01-21 NOTE — Assessment & Plan Note (Addendum)
 Pleasant 63 year old female, increasing axial low back pain after having COVID and a cough. Nothing radicular, no red flag symptoms, we discussed the anatomy and evolutionary anthropology of lumbar disc disease, adding physical therapy, she will continue Celebrex, I would like some x-rays, return in 6 weeks.  Arthritic changes throughout, as well as what appears to be a new, mild L3 compression fracture. This is the likely cause of the pain, especially in the setting of COVID and a cough, This will heal on its own, no change in plan.   Dexa last year was normal.

## 2024-01-21 NOTE — Assessment & Plan Note (Signed)
 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 As she is doing well she will continue with PT and return to see me as needed for this.

## 2024-01-21 NOTE — Progress Notes (Addendum)
    Procedures performed today:    None.  Independent interpretation of notes and tests performed by another provider:   None.  Brief History, Exam, Impression, and Recommendations:    Acute low back pain Pleasant 63 year old female, increasing axial low back pain after having COVID and a cough. Nothing radicular, no red flag symptoms, we discussed the anatomy and evolutionary anthropology of lumbar disc disease, adding physical therapy, she will continue Celebrex, I would like some x-rays, return in 6 weeks.  Arthritic changes throughout, as well as what appears to be a new, mild L3 compression fracture. This is the likely cause of the pain, especially in the setting of COVID and a cough, This will heal on its own, no change in plan.   Dexa last year was normal.  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 As she is doing well she will continue with PT and return to see me as needed for this.    ____________________________________________ Danielle Braun. Danielle Braun, M.D., ABFM., CAQSM., AME. Primary Care and Sports Medicine  [facility]  Adjunct Professor of Family Medicine  [place] [employer]  Restaurant manager, fast food

## 2024-01-21 NOTE — Therapy (Signed)
 OUTPATIENT PHYSICAL THERAPY TREATMENT RE-EVALUATION  RE-CERTIFICATION   Patient Name: Danielle Braun MRN: [id number] DOB:[birth year], 63 y.o., female Today's Date: 01/22/2024  END OF SESSION:  PT End of Session - 01/22/24 0756     Visit Number 11    Number of Visits 23    Date for PT Re-Evaluation 03/07/24    Authorization Type [facility] [employer]    PT Start Time [phone]    PT Stop Time 0840    PT Time Calculation (min) 43 min    Activity Tolerance Patient tolerated treatment well    Behavior During Therapy WFL for tasks assessed/performed                    Past Medical History:  Diagnosis Date   Acne    Arrhythmia    Bursitis, subacromial    Left   Endometriosis    GERD (gastroesophageal reflux disease)    Joint pain    Palpitations    Retinal tear    Sinusitis    SOB (shortness of breath)    Vitamin D deficiency    Past Surgical History:  Procedure Laterality Date   ABDOMINAL HYSTERECTOMY     ABDOMINAL SURGERY     csection   DILATION AND CURETTAGE OF UTERUS     SHOULDER ARTHROSCOPY WITH SUBACROMIAL DECOMPRESSION Left 10/31/2017   Procedure: SHOULDER ARTHROSCOPY WITH BURSECTOMY AND SUBACROMIAL DECOMPRESSION;  Surgeon: Jones Broom, MD;  Location: [facility] OR;  Service: Orthopedics;  Laterality: Left;  Left shoulder arthroscopy bursectomy and subacromial decompression   TONSILLECTOMY     Patient Active Problem List   Diagnosis Date Noted   Acute low back pain 01/21/2024   COVID 12/26/2023   Chronic cough 12/26/2023   Deviated nasal septum 12/26/2023   Hypertrophy of nasal turbinates 12/26/2023   Traumatic tear of meniscus of left knee 11/15/2023   Prediabetes 04/17/2023   History of colonic polyps 04/17/2023   Other fatigue 04/16/2023   SOB (shortness of breath) on exertion 04/16/2023   Health care maintenance 04/16/2023   Family history of diabetes mellitus 04/16/2023   Depression screening 04/16/2023   Generalized obesity 04/16/2023   BMI  30.0-30.9,adult 04/16/2023   Seasonal allergies 11/07/2022   Family history of osteoporosis 11/06/2022   Postmenopause 11/06/2022   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   Obesity (BMI 30-39.9) 01/26/2022   Patellofemoral pain syndrome of right knee 01/17/2022   Freiberg's disease, right 11/09/2021   Age-related osteoporosis without current pathological fracture 11/01/2021   Chronic sinusitis, unspecified 11/01/2021   Retinal tear of left eye 07/05/2021   History of laparoscopic-assisted vaginal hysterectomy 03/31/2021   History of sacrocolpopexy 03/31/2021   Blepharitis of upper and lower eyelids of both eyes 08/23/2020   Dry eye syndrome of both lacrimal glands 08/23/2020   Nuclear sclerotic cataract of both eyes 08/23/2020   Primary refluxing megaureter 03/02/2020   Sensation of feeling hot 01/18/2020   Chronic left shoulder pain 11/11/2018   Vitamin D insufficiency 11/11/2018   Low bone mass 09/26/2017   Immune to measles 02/20/2016   Allergic rhinitis 02/06/2016   GERD (gastroesophageal reflux disease) 02/06/2016   Hyperlipemia 02/06/2016   Keratoconjunctivitis sicca of both eyes not specified as Sjogren's 02/06/2016   Myopia of both eyes with astigmatism 02/06/2016   Posterior vitreous detachment of left eye 02/06/2016   Premature atrial contraction 02/06/2016   History of right salpingo-oophorectomy 03/14/2010   Acute recurrent maxillary sinusitis 11/06/2008    PCP: Loyal Jacobson, MD  REFERRING  PROVIDER: Monica Becton, MD  REFERRING DIAG:  S83.207D (ICD-10-CM) - Traumatic tear of meniscus of left knee, subsequent encounter M54.50 (ICD-10-CM) - Acute bilateral low back pain without sciatica      THERAPY DIAG:  Acute pain of left knee  Muscle weakness (generalized)  Other abnormalities of gait and mobility  Other low back pain  Rationale for Evaluation and Treatment: Rehabilitation  ONSET DATE: ~3 weeks ago  SUBJECTIVE:   SUBJECTIVE  STATEMENT: Patient had f/u with Dr. Karie Schwalbe for her knee and acute onset of back pain. She states the knee is feeling good and she has been weaning from brace as able. Knee pain is about a 2, but it hasn't gotten any worse. Patient reports onset of low back pain started in early March with coughing associated with COVID. Sharp pain occurs about the low back with Valsalva maneuvers and bending over/pushing (work activities) and sometimes when she lays flat. Also feels cramping sensation about the Rt side of the low back first thing in the morning and evening. She reports she has a spot on the Lt side of the low back that has been hurting for 30 years and has scoliosis. No radicular symptoms. No changes in bowel/bladder. Overall the back pain is improving since initial onset.   From eval: Pt states she's had issues with both legs in the last 2 years. Pt was having L hip and leg pain in December. Pt was walking and her knee gave way ~3 weeks ago. Pt states she has been able to do her 12 hour shifts and be careful. Pt reports pain was not so bad she needed an injection.   PERTINENT HISTORY: Frieburgs disease R foot (AVN of metatarsal), OA R knee   PAIN:  Are you having pain? Yes: NPRS scale: 2 (knee); 3 (low back currently; at worst 6)/10  Pain location: Medial LT knee; low back Pain description: twinge Aggravating factors: end of 12 hour shift, twisting (knee); bending, valsalva (back)  Relieving factors: tylenol, ice and knee brace  PRECAUTIONS: None  RED FLAGS: None   WEIGHT BEARING RESTRICTIONS: No  FALLS:  Has patient fallen in last 6 months? No  LIVING ENVIRONMENT: Lives with: lives with their spouse Lives in: House/apartment Stairs: Yes: External: 2 steps; none Has following equipment at home: None  OCCUPATION: ICU nurse at [employer]  PATIENT GOALS: Return to normal activities (pt bikes and rows x20 min each)  NEXT MD VISIT: 03/03/24  OBJECTIVE:  Note: Objective measures were  completed at Evaluation unless otherwise noted.  DIAGNOSTIC FINDINGS: MRI 11/19/23 L knee IMPRESSION: 1. Complete tear at the root of the posterior horn of the medial meniscus, with near absence of meniscal tissue within the 3 mm of the posterior horn closest to the root. Associated mild extrusion of the body of the medial meniscus. 2. Tricompartmental cartilage degenerative changes, severe within the lateral patellofemoral cartilage. 3. Small joint effusion.  PATIENT SURVEYS:  Lower Extremity Functional Score: 47 / 80 = 58.8 % 01/22/24: 59/80.   EDEMA:  "A little edema"  MUSCLE LENGTH: Hamstrings: Left slightly tighter than Right Thomas test: Did not assess  PALPATION: Mostly tender about medial L knee joint line and along edema around medial patella  01/22/24: L spine hypomobility, pain at L2-L4 PAIVM; tautness bilateral lumbar paraspinals   LOWER EXTREMITY ROM:  Active ROM Right eval Left eval 12/31/23   Hip flexion     Hip extension     Hip abduction  Hip adduction     Hip internal rotation     Hip external rotation     Knee flexion 125 125 Lt: 140; Rt: 140   Knee extension 0 -5 Lt: 0; Rt: 0   Ankle dorsiflexion     Ankle plantarflexion     Ankle inversion     Ankle eversion      (Blank rows = not tested)  LOWER EXTREMITY MMT:  MMT Right eval Left eval 01/22/24  Hip flexion 5 4* 5 bilateral; pain in low back with RLE  Hip extension 4 3- 4 bilateral   Hip abduction 5 3+ 4 bilateral   Hip adduction 5 3+ 4+ bilateral   Hip internal rotation     Hip external rotation     Knee flexion 5 5   Knee extension 5 5   Ankle dorsiflexion     Ankle plantarflexion     Ankle inversion     Ankle eversion      (Blank rows = not tested, * = pain) LUMBAR ROM:   Active  AROM  01/22/24  Flexion Full   Extension Full pain center of low back  Right lateral flexion WNL pain Rt low back  Left lateral flexion WNL  Right rotation WNL low back pain  Left rotation WNL low  back pain   (Blank rows = not tested)  LOWER EXTREMITY SPECIAL TESTS:  Hip special tests: Luisa Hart (FABER) test: negative and Craig's test: negative  FUNCTIONAL TESTS:  5x STS: 15.92 sec SLS: R 20 sec, L 19 sec  01/22/24: 5 x STS: 8.74 sec   GAIT: Distance walked: Into clinic Assistive device utilized: L knee brace Level of assistance: Complete Independence Comments: Mildly antalgic with L knee slightly kept in flexion [facility] Adult PT Treatment:                                                DATE: 01/21/24 Therapeutic Exercise: LTR x 10  Cat/cow x 5  Figure 4 x 30 sec each  Reviewed knee HEP, issued back HEP   Neuromuscular re-ed: Bent knee fallout 2 x 10  SLR with posterior pelvic tilt x 10 each     [facility] Adult PT Treatment:                                                DATE: 01/16/24 Therapeutic Exercise: Recumbent bike level 1 x 5 minutes   Neuromuscular re-ed: Opposite touchdown to chair 2 x 10  Med ball trunk rotation on airex 2 x 10 each; semi-tandem stance  Calf raise on airex 2 x 10  Semi-tandem on airex with kettlebell pass 2 x 10  Tandem walks 2 x 15 ft each fwd/bwd  Therapeutic Activity: Sit to stand 10 lb kettlebell 2 x 10  Leg press 2 x 15 @65  lbs    [facility] Adult PT Treatment:                                                DATE: 01/14/24 Therapeutic Exercise: NuStep level 6 x 5 minutes UE/LE  Lateral,forward, and backward band walks 2 x 15 ft Updated HEP   Neuromuscular re-ed: 3 way hip on airex 2 x 10  Squat on airex 2 x 10  March on airex 2 x 10  Opposite touchdown to chair 2 x 10   Self Care: Weaning from brace at work Recommendation to f/u with MD regarding back pain              PATIENT EDUCATION:  Education details: HEP update; POC, assessment finding  Person educated: Patient Education method: Programmer, multimedia, demo, cues, handout Education comprehension: verbalized understanding, returned demo, cues   HOME EXERCISE PROGRAM: Access Code:  [email or web address] URL: [email or web address] Date: 01/14/2024 Prepared by: Letitia Libra Instructed to complete 2-3 times weekly.   Exercises - Runner's Step Up/Down  - 1 x daily - 7 x weekly - 2 sets - 10 reps - Bridge with Hip Abduction and Resistance  - 1 x daily - 7 x weekly - 2 sets - 10 reps - Forward Step Down Touch with Heel  - 1 x daily - 7 x weekly - 2 sets - 10 reps - The Diver  - 1 x daily - 7 x weekly - 2 sets - 10 reps - Lunge Matrix on Slider  - 1 x daily - 7 x weekly - 2 sets - 10 reps - Standing 3-way Hip with Walker  - 1 x daily - 7 x weekly - 2 sets - 10 reps - Side Stepping with Resistance at Ankles  - 1 x daily - 7 x weekly - 2 sets - 10 reps - Backward Band Walks with Resistance at Thighs and Ankles  - 1 x daily - 7 x weekly - 2 sets - 10 reps - Forward Band Walks with Resistance at Thighs and Ankles  - 1 x daily - 7 x weekly - 2 sets - 10 reps  Access Code: [email or web address] BACK URL: [email or web address] Date: 01/22/2024 Prepared by: Letitia Libra  Exercises - Supine Lower Trunk Rotation  - 1 x daily - 7 x weekly - 1 sets - 10 reps - Cat Cow  - 1 x daily - 7 x weekly - 1 sets - 10 reps - Bent Knee Fallouts  - 1 x daily - 7 x weekly - 2 sets - 10 reps - Supine Figure 4 Piriformis Stretch  - 1 x daily - 7 x weekly - 3 sets - 30  hold - Supine Pelvic Tilt with Straight Leg Raise  - 1 x daily - 7 x weekly - 2 sets - 10 reps   ASSESSMENT:  CLINICAL IMPRESSION: Re-assessment of the Lt knee and re-evaluation performed for new referral for acute bilateral low back pain without sciatica. She has progressed well in regards to her Lt knee reporting occasional mild levels of pain with full ROM and strength about the Lt knee. She has met majority of goals related to the knee, with exception of hip strength goal. We discussed continuing with HEP 2-3 x weekly for maintenance/independent progression and that we will be focusing our treatment on the acute low  back pain which began at the beginning of March from repetitive coughing due to COVID. Upon assessment she is noted to have good lumbar AROM, though experiences pain with extension, rotation, and Rt lateral flexion. She has tautness about lumbar paraspinals and L-spine hypomobility with pain noted during PAIVM L2-L4. She has hip and core weakness and reports increased pain with functional bending and pushing tasks at work. She will  benefit from skilled PT to address the above stated deficits in order to optimize her function and assist in overall pain reduction.   From eval: Patient is a 63 y.o. F who was seen today for physical therapy evaluation and treatment for L knee meniscus tear. PMH significant for Frieburg's on R foot. Assessment demos generally good L knee quad strength; however, decreased lateral and medial knee stability with her pain most notable during twisting/rotary motions. Pt demos decreased hip adductor, abd and glute/extensor strength affecting her tolerance for prolonged standing and walking tasks. Pt will benefit from PT to address these deficits for return to normal function.   OBJECTIVE IMPAIRMENTS: Abnormal gait, decreased activity tolerance, decreased endurance, decreased mobility, difficulty walking, decreased ROM, decreased strength, increased edema, increased fascial restrictions, improper body mechanics, and pain.     GOALS: Goals reviewed with patient? Yes  SHORT TERM GOALS: Target date: 12/26/2023  Pt will be ind with initial HEP Baseline: Goal status: MET  2.  Pt will demo L = R knee ROM Baseline:  Goal status: MET   LONG TERM GOALS: Target date: 03/07/24   Pt will be ind with management and progression of HEP for full return to her normal gym activities (I.e. rowing and biking) Baseline:  Goal status: partially met (independent with knee HEP), issued back HEP   2.  Pt will report >/=75% improvement in her overall knee pain with activity Baseline:   01/22/24: reports 75% improvement  Goal status: MET  3.  Pt will demo improved 5x STS to </=10 sec for increased functional LE strength Baseline:  Goal status: MET  4.  Pt will demo at least 4+/5 hip abductor and extensor for improved knee and lumbopelvic stability.  Baseline:  Goal status: REVISED   5.  Pt will have improved LEFS to >/=56/80 to demo MCID Baseline:  Goal status: MET  6. Patient will be able to sustained bent over lunge positioning for at least 30 seconds without pain to improve tolerance to work specific activity.   Baseline: 6/10 pain with bedside work   Goal status: NEW  7. Patient will demonstrate pain free lumbar AROM to improve ability to complete reaching and bending tasks.   Baseline: see AROM above  Goal Status: NEW    PLAN:  PT FREQUENCY: 2x/week  PT DURATION: 6 weeks  PLANNED INTERVENTIONS: [id number]- PT Re-evaluation, 97110-Therapeutic exercises, 97530- Therapeutic activity, [id number]- Neuromuscular re-education, 97535- Self Care, 98119- Manual therapy, [id number]- Gait training, [phone]- Aquatic Therapy, [phone]- Electrical stimulation (unattended), [phone]- Electrical stimulation (manual), [id number]- Vasopneumatic device, [id number]- Ultrasound, [id number]- Traction (mechanical), [id number]- Ionotophoresis 4mg /ml Dexamethasone, Patient/Family education, Balance training, Stair training, Taping, Dry Needling, Joint mobilization, Spinal mobilization, Cryotherapy, and Moist heat  PLAN FOR NEXT SESSION: Progress HEP prn; manual to L-spine (consider cupping, TPDN); core stabilization. Lifting mechanics (hip hinge)   Letitia Libra, PT, DPT, ATC 01/22/24 8:53 AM

## 2024-01-22 ENCOUNTER — Ambulatory Visit: Payer: Self-pay

## 2024-01-22 DIAGNOSIS — M25562 Pain in left knee: Secondary | ICD-10-CM | POA: Diagnosis not present

## 2024-01-22 DIAGNOSIS — M6281 Muscle weakness (generalized): Secondary | ICD-10-CM | POA: Diagnosis not present

## 2024-01-22 DIAGNOSIS — M5459 Other low back pain: Secondary | ICD-10-CM | POA: Diagnosis not present

## 2024-01-22 DIAGNOSIS — R2689 Other abnormalities of gait and mobility: Secondary | ICD-10-CM | POA: Diagnosis not present

## 2024-01-23 ENCOUNTER — Encounter

## 2024-01-23 NOTE — Therapy (Signed)
 OUTPATIENT PHYSICAL THERAPY TREATMENT    Patient Name: Danielle Braun MRN: [id number] DOB:07/11/1961, 63 y.o., female Today's Date: 01/24/2024  END OF SESSION:  PT End of Session - 01/24/24 0846     Visit Number 12    Number of Visits 23    Date for PT Re-Evaluation 03/07/24    Authorization Type Gret[employer]    PT Start Time [phone]    PT Stop Time 0929    PT Time Calculation (min) 43 min    Activity Tolerance Patient tolerated treatment well    Behavior During Therapy WFL for tasks assessed/performed                     Past Medical History:  Diagnosis Date   Acne    Arrhythmia    Bursitis, subacromial    Left   Endometriosis    GERD (gastroesophageal reflux disease)    Joint pain    Palpitations    Retinal tear    Sinusitis    SOB (shortness of breath)    Vitamin D deficiency    Past Surgical History:  Procedure Laterality Date   ABDOMINAL HYSTERECTOMY     ABDOMINAL SURGERY     csection   DILATION AND CURETTAGE OF UTERUS     SHOULDER ARTHROSCOPY WITH SUBACROMIAL DECOMPRESSION Left 10/31/2017   Procedure: SHOULDER ARTHROSCOPY WITH BURSECTOMY AND SUBACROMIAL DECOMPRESSION;  Surgeon: Jones Broom, MD;  Location: [facility] OR;  Service: Orthopedics;  Laterality: Left;  Left shoulder arthroscopy bursectomy and subacromial decompression   TONSILLECTOMY     Patient Active Problem List   Diagnosis Date Noted   Acute low back pain 01/21/2024   COVID 12/26/2023   Chronic cough 12/26/2023   Deviated nasal septum 12/26/2023   Hypertrophy of nasal turbinates 12/26/2023   Traumatic tear of meniscus of left knee 11/15/2023   Prediabetes 04/17/2023   History of colonic polyps 04/17/2023   Other fatigue 04/16/2023   SOB (shortness of breath) on exertion 04/16/2023   Health care maintenance 04/16/2023   Family history of diabetes mellitus 04/16/2023   Depression screening 04/16/2023   Generalized obesity 04/16/2023   BMI 30.0-30.9,adult 04/16/2023   Seasonal  allergies 11/07/2022   Family history of osteoporosis 11/06/2022   Postmenopause 11/06/2022   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   Obesity (BMI 30-39.9) 01/26/2022   Patellofemoral pain syndrome of right knee 01/17/2022   Freiberg's disease, right 11/09/2021   Age-related osteoporosis without current pathological fracture 11/01/2021   Chronic sinusitis, unspecified 11/01/2021   Retinal tear of left eye 07/05/2021   History of laparoscopic-assisted vaginal hysterectomy 03/31/2021   History of sacrocolpopexy 03/31/2021   Blepharitis of upper and lower eyelids of both eyes 08/23/2020   Dry eye syndrome of both lacrimal glands 08/23/2020   Nuclear sclerotic cataract of both eyes 08/23/2020   Primary refluxing megaureter 03/02/2020   Sensation of feeling hot 01/18/2020   Chronic left shoulder pain 11/11/2018   Vitamin D insufficiency 11/11/2018   Low bone mass 09/26/2017   Immune to measles 02/20/2016   Allergic rhinitis 02/06/2016   GERD (gastroesophageal reflux disease) 02/06/2016   Hyperlipemia 02/06/2016   Keratoconjunctivitis sicca of both eyes not specified as Sjogren's 02/06/2016   Myopia of both eyes with astigmatism 02/06/2016   Posterior vitreous detachment of left eye 02/06/2016   Premature atrial contraction 02/06/2016   History of right salpingo-oophorectomy 03/14/2010   Acute recurrent maxillary sinusitis 11/06/2008    PCP: Loyal Jacobson, MD  REFERRING PROVIDER:  Monica Becton, MD  REFERRING DIAG:  S83.207D (ICD-10-CM) - Traumatic tear of meniscus of left knee, subsequent encounter M54.50 (ICD-10-CM) - Acute bilateral low back pain without sciatica      THERAPY DIAG:  Acute pain of left knee  Muscle weakness (generalized)  Other abnormalities of gait and mobility  Other low back pain  Rationale for Evaluation and Treatment: Rehabilitation  ONSET DATE: ~3 weeks ago  SUBJECTIVE:   SUBJECTIVE STATEMENT: Patient just finished work  shift and the back did ok. Nothing that triggered it at work.   From eval: Pt states she's had issues with both legs in the last 2 years. Pt was having L hip and leg pain in December. Pt was walking and her knee gave way ~3 weeks ago. Pt states she has been able to do her 12 hour shifts and be careful. Pt reports pain was not so bad she needed an injection.   PERTINENT HISTORY: Frieburgs disease R foot (AVN of metatarsal), OA R knee   PAIN:  Are you having pain? Yes: NPRS scale: none currently; at worst 3/10  Pain location: low back Pain description: twinge Aggravating factors: end of 12 hour shift, twisting (knee); bending, valsalva (back)  Relieving factors: tylenol, ice and knee brace  PRECAUTIONS: None  RED FLAGS: None   WEIGHT BEARING RESTRICTIONS: No  FALLS:  Has patient fallen in last 6 months? No  LIVING ENVIRONMENT: Lives with: lives with their spouse Lives in: House/apartment Stairs: Yes: External: 2 steps; none Has following equipment at home: None  OCCUPATION: ICU nurse at [employer]  PATIENT GOALS: Return to normal activities (pt bikes and rows x20 min each)  NEXT MD VISIT: 03/03/24  OBJECTIVE:  Note: Objective measures were completed at Evaluation unless otherwise noted.  DIAGNOSTIC FINDINGS: MRI 11/19/23 L knee IMPRESSION: 1. Complete tear at the root of the posterior horn of the medial meniscus, with near absence of meniscal tissue within the 3 mm of the posterior horn closest to the root. Associated mild extrusion of the body of the medial meniscus. 2. Tricompartmental cartilage degenerative changes, severe within the lateral patellofemoral cartilage. 3. Small joint effusion.  PATIENT SURVEYS:  Lower Extremity Functional Score: 47 / 80 = 58.8 % 01/22/24: 59/80.   EDEMA:  "A little edema"  MUSCLE LENGTH: Hamstrings: Left slightly tighter than Right Thomas test: Did not assess  PALPATION: Mostly tender about medial L knee joint line and along  edema around medial patella  01/22/24: L spine hypomobility, pain at L2-L4 PAIVM; tautness bilateral lumbar paraspinals   LOWER EXTREMITY ROM:  Active ROM Right eval Left eval 12/31/23   Hip flexion     Hip extension     Hip abduction     Hip adduction     Hip internal rotation     Hip external rotation     Knee flexion 125 125 Lt: 140; Rt: 140   Knee extension 0 -5 Lt: 0; Rt: 0   Ankle dorsiflexion     Ankle plantarflexion     Ankle inversion     Ankle eversion      (Blank rows = not tested)  LOWER EXTREMITY MMT:  MMT Right eval Left eval 01/22/24  Hip flexion 5 4* 5 bilateral; pain in low back with RLE  Hip extension 4 3- 4 bilateral   Hip abduction 5 3+ 4 bilateral   Hip adduction 5 3+ 4+ bilateral   Hip internal rotation     Hip external rotation  Knee flexion 5 5   Knee extension 5 5   Ankle dorsiflexion     Ankle plantarflexion     Ankle inversion     Ankle eversion      (Blank rows = not tested, * = pain) LUMBAR ROM:   Active  AROM  01/22/24  Flexion Full   Extension Full pain center of low back  Right lateral flexion WNL pain Rt low back  Left lateral flexion WNL  Right rotation WNL low back pain  Left rotation WNL low back pain   (Blank rows = not tested)  LOWER EXTREMITY SPECIAL TESTS:  Hip special tests: Luisa Hart (FABER) test: negative and Craig's test: negative  FUNCTIONAL TESTS:  5x STS: 15.92 sec SLS: R 20 sec, L 19 sec  01/22/24: 5 x STS: 8.74 sec   GAIT: Distance walked: Into clinic Assistive device utilized: L knee brace Level of assistance: Complete Independence Comments: Mildly antalgic with L knee slightly kept in flexion [facility] Adult PT Treatment:                                                DATE: 01/24/24 Therapeutic Exercise: LTR with figure 4 x 1 min each  SKTC x 10 each; 5 sec hold  Open book x 10 each  Cat/cow x  10  Manual Therapy: STM/DTM lumbar paraspinals, QL L-spine mobilization grade II-III Neuromuscular  re-ed: Supine TA march 2 x 10  Supine bent knee fallout 2 x 10     [facility] Adult PT Treatment:                                                DATE: 01/21/24 Therapeutic Exercise: LTR x 10  Cat/cow x 5  Figure 4 x 30 sec each  Reviewed knee HEP, issued back HEP   Neuromuscular re-ed: Bent knee fallout 2 x 10  SLR with posterior pelvic tilt x 10 each     [facility] Adult PT Treatment:                                                DATE: 01/16/24 Therapeutic Exercise: Recumbent bike level 1 x 5 minutes   Neuromuscular re-ed: Opposite touchdown to chair 2 x 10  Med ball trunk rotation on airex 2 x 10 each; semi-tandem stance  Calf raise on airex 2 x 10  Semi-tandem on airex with kettlebell pass 2 x 10  Tandem walks 2 x 15 ft each fwd/bwd  Therapeutic Activity: Sit to stand 10 lb kettlebell 2 x 10  Leg press 2 x 15 @65  lbs              PATIENT EDUCATION:  Education details: HEP review  Person educated: Patient Education method: Programmer, multimedia,  Education comprehension: verbalized understanding,   HOME EXERCISE PROGRAM: Access Code: [email or web address] URL: [email or web address] Date: 01/14/2024 Prepared by: Letitia Libra Instructed to complete 2-3 times weekly.   Exercises - Runner's Step Up/Down  - 1 x daily - 7 x weekly - 2 sets - 10 reps - Bridge with Hip Abduction and Resistance  -  1 x daily - 7 x weekly - 2 sets - 10 reps - Forward Step Down Touch with Heel  - 1 x daily - 7 x weekly - 2 sets - 10 reps - The Diver  - 1 x daily - 7 x weekly - 2 sets - 10 reps - Lunge Matrix on Slider  - 1 x daily - 7 x weekly - 2 sets - 10 reps - Standing 3-way Hip with Walker  - 1 x daily - 7 x weekly - 2 sets - 10 reps - Side Stepping with Resistance at Ankles  - 1 x daily - 7 x weekly - 2 sets - 10 reps - Backward Band Walks with Resistance at Thighs and Ankles  - 1 x daily - 7 x weekly - 2 sets - 10 reps - Forward Band Walks with Resistance at Thighs and Ankles  - 1 x daily - 7  x weekly - 2 sets - 10 reps  Access Code: [email or web address] BACK URL: [email or web address] Date: 01/22/2024 Prepared by: Letitia Libra  Exercises - Supine Lower Trunk Rotation  - 1 x daily - 7 x weekly - 1 sets - 10 reps - Cat Cow  - 1 x daily - 7 x weekly - 1 sets - 10 reps - Bent Knee Fallouts  - 1 x daily - 7 x weekly - 2 sets - 10 reps - Supine Figure 4 Piriformis Stretch  - 1 x daily - 7 x weekly - 3 sets - 30  hold - Supine Pelvic Tilt with Straight Leg Raise  - 1 x daily - 7 x weekly - 2 sets - 10 reps   ASSESSMENT:  CLINICAL IMPRESSION: Patient arrives without reports of back pain. Focused on manual work to improve soft tissue mobility and gentle lumbar/hip mobility and core strengthening with good tolerance. Mild difficulty maintaining TA engagement with hooklying core stabilization. No reports of pain throughout session.     RE-EVAL: Re-assessment of the Lt knee and re-evaluation performed for new referral for acute bilateral low back pain without sciatica. She has progressed well in regards to her Lt knee reporting occasional mild levels of pain with full ROM and strength about the Lt knee. She has met majority of goals related to the knee, with exception of hip strength goal. We discussed continuing with HEP 2-3 x weekly for maintenance/independent progression and that we will be focusing our treatment on the acute low back pain which began at the beginning of March from repetitive coughing due to COVID. Upon assessment she is noted to have good lumbar AROM, though experiences pain with extension, rotation, and Rt lateral flexion. She has tautness about lumbar paraspinals and L-spine hypomobility with pain noted during PAIVM L2-L4. She has hip and core weakness and reports increased pain with functional bending and pushing tasks at work. She will benefit from skilled PT to address the above stated deficits in order to optimize her function and assist in overall pain  reduction.   From eval: Patient is a 63 y.o. F who was seen today for physical therapy evaluation and treatment for L knee meniscus tear. PMH significant for Frieburg's on R foot. Assessment demos generally good L knee quad strength; however, decreased lateral and medial knee stability with her pain most notable during twisting/rotary motions. Pt demos decreased hip adductor, abd and glute/extensor strength affecting her tolerance for prolonged standing and walking tasks. Pt will benefit from PT to address these deficits for return to normal function.  OBJECTIVE IMPAIRMENTS: Abnormal gait, decreased activity tolerance, decreased endurance, decreased mobility, difficulty walking, decreased ROM, decreased strength, increased edema, increased fascial restrictions, improper body mechanics, and pain.     GOALS: Goals reviewed with patient? Yes  SHORT TERM GOALS: Target date: 12/26/2023  Pt will be ind with initial HEP Baseline: Goal status: MET  2.  Pt will demo L = R knee ROM Baseline:  Goal status: MET   LONG TERM GOALS: Target date: 03/07/24   Pt will be ind with management and progression of HEP for full return to her normal gym activities (I.e. rowing and biking) Baseline:  Goal status: partially met (independent with knee HEP), issued back HEP   2.  Pt will report >/=75% improvement in her overall knee pain with activity Baseline:  01/22/24: reports 75% improvement  Goal status: MET  3.  Pt will demo improved 5x STS to </=10 sec for increased functional LE strength Baseline:  Goal status: MET  4.  Pt will demo at least 4+/5 hip abductor and extensor for improved knee and lumbopelvic stability.  Baseline:  Goal status: REVISED   5.  Pt will have improved LEFS to >/=56/80 to demo MCID Baseline:  Goal status: MET  6. Patient will be able to sustained bent over lunge positioning for at least 30 seconds without pain to improve tolerance to work specific activity.   Baseline:  6/10 pain with bedside work   Goal status: NEW  7. Patient will demonstrate pain free lumbar AROM to improve ability to complete reaching and bending tasks.   Baseline: see AROM above  Goal Status: NEW    PLAN:  PT FREQUENCY: 2x/week  PT DURATION: 6 weeks  PLANNED INTERVENTIONS: 97164- PT Re-evaluation, 97110-Therapeutic exercises, 97530- Therapeutic activity, [id number]- Neuromuscular re-education, 97535- Self Care, [id number]- Manual therapy, [id number]- Gait training, [phone]- Aquatic Therapy, [phone]- Electrical stimulation (unattended), [phone]- Electrical stimulation (manual), [id number]- Vasopneumatic device, [id number]- Ultrasound, [id number]- Traction (mechanical), [id number]- Ionotophoresis 4mg /ml Dexamethasone, Patient/Family education, Balance training, Stair training, Taping, Dry Needling, Joint mobilization, Spinal mobilization, Cryotherapy, and Moist heat  PLAN FOR NEXT SESSION: Progress HEP prn; manual to L-spine (consider cupping, TPDN); core stabilization. Lifting mechanics (hip hinge)   Letitia Libra, PT, DPT, ATC 01/24/24 9:30 AM

## 2024-01-24 ENCOUNTER — Ambulatory Visit: Payer: Self-pay

## 2024-01-24 DIAGNOSIS — M5459 Other low back pain: Secondary | ICD-10-CM

## 2024-01-24 DIAGNOSIS — M25562 Pain in left knee: Secondary | ICD-10-CM | POA: Diagnosis not present

## 2024-01-24 DIAGNOSIS — R2689 Other abnormalities of gait and mobility: Secondary | ICD-10-CM

## 2024-01-24 DIAGNOSIS — M6281 Muscle weakness (generalized): Secondary | ICD-10-CM

## 2024-01-28 ENCOUNTER — Ambulatory Visit: Attending: Sports Medicine

## 2024-01-28 DIAGNOSIS — M6281 Muscle weakness (generalized): Secondary | ICD-10-CM | POA: Diagnosis not present

## 2024-01-28 DIAGNOSIS — M25661 Stiffness of right knee, not elsewhere classified: Secondary | ICD-10-CM | POA: Insufficient documentation

## 2024-01-28 DIAGNOSIS — R252 Cramp and spasm: Secondary | ICD-10-CM | POA: Insufficient documentation

## 2024-01-28 DIAGNOSIS — R2689 Other abnormalities of gait and mobility: Secondary | ICD-10-CM | POA: Insufficient documentation

## 2024-01-28 DIAGNOSIS — R262 Difficulty in walking, not elsewhere classified: Secondary | ICD-10-CM | POA: Insufficient documentation

## 2024-01-28 DIAGNOSIS — M25561 Pain in right knee: Secondary | ICD-10-CM | POA: Diagnosis present

## 2024-01-28 DIAGNOSIS — M5459 Other low back pain: Secondary | ICD-10-CM | POA: Diagnosis present

## 2024-01-28 DIAGNOSIS — M25562 Pain in left knee: Secondary | ICD-10-CM | POA: Diagnosis present

## 2024-01-28 NOTE — Therapy (Signed)
 OUTPATIENT PHYSICAL THERAPY TREATMENT    Patient Name: Danielle Braun MRN: [id number] DOB:Aug 01, 1961, 63 y.o., female Today's Date: 01/28/2024  END OF SESSION:  PT End of Session - 01/28/24 1532     Visit Number 13    Number of Visits 23    Date for PT Re-Evaluation 03/07/24    Authorization Type [facility] [employer]    PT Start Time 1532    PT Stop Time 1617    PT Time Calculation (min) 45 min    Activity Tolerance Patient tolerated treatment well    Behavior During Therapy WFL for tasks assessed/performed                      Past Medical History:  Diagnosis Date   Acne    Arrhythmia    Bursitis, subacromial    Left   Endometriosis    GERD (gastroesophageal reflux disease)    Joint pain    Palpitations    Retinal tear    Sinusitis    SOB (shortness of breath)    Vitamin D deficiency    Past Surgical History:  Procedure Laterality Date   ABDOMINAL HYSTERECTOMY     ABDOMINAL SURGERY     csection   DILATION AND CURETTAGE OF UTERUS     SHOULDER ARTHROSCOPY WITH SUBACROMIAL DECOMPRESSION Left 10/31/2017   Procedure: SHOULDER ARTHROSCOPY WITH BURSECTOMY AND SUBACROMIAL DECOMPRESSION;  Surgeon: Jones Broom, MD;  Location: [facility] OR;  Service: Orthopedics;  Laterality: Left;  Left shoulder arthroscopy bursectomy and subacromial decompression   TONSILLECTOMY     Patient Active Problem List   Diagnosis Date Noted   Acute low back pain 01/21/2024   COVID 12/26/2023   Chronic cough 12/26/2023   Deviated nasal septum 12/26/2023   Hypertrophy of nasal turbinates 12/26/2023   Traumatic tear of meniscus of left knee 11/15/2023   Prediabetes 04/17/2023   History of colonic polyps 04/17/2023   Other fatigue 04/16/2023   SOB (shortness of breath) on exertion 04/16/2023   Health care maintenance 04/16/2023   Family history of diabetes mellitus 04/16/2023   Depression screening 04/16/2023   Generalized obesity 04/16/2023   BMI 30.0-30.9,adult 04/16/2023    Seasonal allergies 11/07/2022   Family history of osteoporosis 11/06/2022   Postmenopause 11/06/2022   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   Obesity (BMI 30-39.9) 01/26/2022   Patellofemoral pain syndrome of right knee 01/17/2022   Freiberg's disease, right 11/09/2021   Age-related osteoporosis without current pathological fracture 11/01/2021   Chronic sinusitis, unspecified 11/01/2021   Retinal tear of left eye 07/05/2021   History of laparoscopic-assisted vaginal hysterectomy 03/31/2021   History of sacrocolpopexy 03/31/2021   Blepharitis of upper and lower eyelids of both eyes 08/23/2020   Dry eye syndrome of both lacrimal glands 08/23/2020   Nuclear sclerotic cataract of both eyes 08/23/2020   Primary refluxing megaureter 03/02/2020   Sensation of feeling hot 01/18/2020   Chronic left shoulder pain 11/11/2018   Vitamin D insufficiency 11/11/2018   Low bone mass 09/26/2017   Immune to measles 02/20/2016   Allergic rhinitis 02/06/2016   GERD (gastroesophageal reflux disease) 02/06/2016   Hyperlipemia 02/06/2016   Keratoconjunctivitis sicca of both eyes not specified as Sjogren's 02/06/2016   Myopia of both eyes with astigmatism 02/06/2016   Posterior vitreous detachment of left eye 02/06/2016   Premature atrial contraction 02/06/2016   History of right salpingo-oophorectomy 03/14/2010   Acute recurrent maxillary sinusitis 11/06/2008    PCP: Loyal Jacobson, MD  REFERRING  PROVIDER: Monica Becton, MD  REFERRING DIAG:  S83.207D (ICD-10-CM) - Traumatic tear of meniscus of left knee, subsequent encounter M54.50 (ICD-10-CM) - Acute bilateral low back pain without sciatica      THERAPY DIAG:  Muscle weakness (generalized)  Other abnormalities of gait and mobility  Other low back pain  Rationale for Evaluation and Treatment: Rehabilitation  ONSET DATE: ~3 weeks ago  SUBJECTIVE:   SUBJECTIVE STATEMENT: "Surprisingly amazing." Patient had a busy  evening at work last night and the back was "ok." Still felt a little bit of twinge with certain movements at work.   From eval: Pt states she's had issues with both legs in the last 2 years. Pt was having L hip and leg pain in December. Pt was walking and her knee gave way ~3 weeks ago. Pt states she has been able to do her 12 hour shifts and be careful. Pt reports pain was not so bad she needed an injection.   PERTINENT HISTORY: Frieburgs disease R foot (AVN of metatarsal), OA R knee   PAIN:  Are you having pain? Yes: NPRS scale: none currently; at worst 1/10  Pain location: low back Pain description: twinge Aggravating factors: end of 12 hour shift, twisting (knee); bending, valsalva (back)  Relieving factors: tylenol, ice and knee brace  PRECAUTIONS: None  RED FLAGS: None   WEIGHT BEARING RESTRICTIONS: No  FALLS:  Has patient fallen in last 6 months? No  LIVING ENVIRONMENT: Lives with: lives with their spouse Lives in: House/apartment Stairs: Yes: External: 2 steps; none Has following equipment at home: None  OCCUPATION: ICU nurse at [employer]  PATIENT GOALS: Return to normal activities (pt bikes and rows x20 min each)  NEXT MD VISIT: 03/03/24  OBJECTIVE:  Note: Objective measures were completed at Evaluation unless otherwise noted.  DIAGNOSTIC FINDINGS: MRI 11/19/23 L knee IMPRESSION: 1. Complete tear at the root of the posterior horn of the medial meniscus, with near absence of meniscal tissue within the 3 mm of the posterior horn closest to the root. Associated mild extrusion of the body of the medial meniscus. 2. Tricompartmental cartilage degenerative changes, severe within the lateral patellofemoral cartilage. 3. Small joint effusion.  PATIENT SURVEYS:  Lower Extremity Functional Score: 47 / 80 = 58.8 % 01/22/24: 59/80.   EDEMA:  "A little edema"  MUSCLE LENGTH: Hamstrings: Left slightly tighter than Right Thomas test: Did not  assess  PALPATION: Mostly tender about medial L knee joint line and along edema around medial patella  01/22/24: L spine hypomobility, pain at L2-L4 PAIVM; tautness bilateral lumbar paraspinals   LOWER EXTREMITY ROM:  Active ROM Right eval Left eval 12/31/23   Hip flexion     Hip extension     Hip abduction     Hip adduction     Hip internal rotation     Hip external rotation     Knee flexion 125 125 Lt: 140; Rt: 140   Knee extension 0 -5 Lt: 0; Rt: 0   Ankle dorsiflexion     Ankle plantarflexion     Ankle inversion     Ankle eversion      (Blank rows = not tested)  LOWER EXTREMITY MMT:  MMT Right eval Left eval 01/22/24  Hip flexion 5 4* 5 bilateral; pain in low back with RLE  Hip extension 4 3- 4 bilateral   Hip abduction 5 3+ 4 bilateral   Hip adduction 5 3+ 4+ bilateral   Hip internal rotation  Hip external rotation     Knee flexion 5 5   Knee extension 5 5   Ankle dorsiflexion     Ankle plantarflexion     Ankle inversion     Ankle eversion      (Blank rows = not tested, * = pain) LUMBAR ROM:   Active  AROM  01/22/24  Flexion Full   Extension Full pain center of low back  Right lateral flexion WNL pain Rt low back  Left lateral flexion WNL  Right rotation WNL low back pain  Left rotation WNL low back pain   (Blank rows = not tested)  LOWER EXTREMITY SPECIAL TESTS:  Hip special tests: Luisa Hart (FABER) test: negative and Craig's test: negative  FUNCTIONAL TESTS:  5x STS: 15.92 sec SLS: R 20 sec, L 19 sec  01/22/24: 5 x STS: 8.74 sec   GAIT: Distance walked: Into clinic Assistive device utilized: L knee brace Level of assistance: Complete Independence Comments: Mildly antalgic with L knee slightly kept in flexion [facility] Adult PT Treatment:                                                DATE: 01/28/24  Manual Therapy: Silicone cupping to lumbar paraspinals, QL  STM lumbar paraspinals, QL Neuromuscular re-ed: Supine TA march 2 x 10  SLR with  posterior pelvic tilt 2 x 10  90/90 toe taps 2 x 10  Quadruped arm reach x 10  Quadruped leg lift 2 x 10  Seated hip hinge with dowel x 10     [facility] Adult PT Treatment:                                                DATE: 01/24/24 Therapeutic Exercise: LTR with figure 4 x 1 min each  SKTC x 10 each; 5 sec hold  Open book x 10 each  Cat/cow x  10  Manual Therapy: STM/DTM lumbar paraspinals, QL L-spine mobilization grade II-III Neuromuscular re-ed: Supine TA march 2 x 10  Supine bent knee fallout 2 x 10     [facility] Adult PT Treatment:                                                DATE: 01/21/24 Therapeutic Exercise: LTR x 10  Cat/cow x 5  Figure 4 x 30 sec each  Reviewed knee HEP, issued back HEP   Neuromuscular re-ed: Bent knee fallout 2 x 10  SLR with posterior pelvic tilt x 10 each              PATIENT EDUCATION:  Education details: HEP update  Person educated: Patient Education method: Explanation, demo, cues, handout Education comprehension: verbalized understanding, returned demo, cues   HOME EXERCISE PROGRAM: Access Code: [email or web address] URL: [email or web address] Date: 01/14/2024 Prepared by: Letitia Libra Instructed to complete 2-3 times weekly.   Exercises - Runner's Step Up/Down  - 1 x daily - 7 x weekly - 2 sets - 10 reps - Bridge with Hip Abduction and Resistance  - 1 x daily - 7 x weekly - 2  sets - 10 reps - Forward Step Down Touch with Heel  - 1 x daily - 7 x weekly - 2 sets - 10 reps - The Diver  - 1 x daily - 7 x weekly - 2 sets - 10 reps - Lunge Matrix on Slider  - 1 x daily - 7 x weekly - 2 sets - 10 reps - Standing 3-way Hip with Walker  - 1 x daily - 7 x weekly - 2 sets - 10 reps - Side Stepping with Resistance at Ankles  - 1 x daily - 7 x weekly - 2 sets - 10 reps - Backward Band Walks with Resistance at Thighs and Ankles  - 1 x daily - 7 x weekly - 2 sets - 10 reps - Forward Band Walks with Resistance at Thighs and Ankles  - 1 x  daily - 7 x weekly - 2 sets - 10 reps  Access Code: [email or web address] BACK URL: [email or web address] Date: 01/28/2024 Prepared by: Letitia Libra  Exercises - Supine Lower Trunk Rotation  - 1 x daily - 7 x weekly - 1 sets - 10 reps - Cat Cow  - 1 x daily - 7 x weekly - 1 sets - 10 reps - Supine Figure 4 Piriformis Stretch  - 1 x daily - 7 x weekly - 3 sets - 30  hold - Supine Pelvic Tilt with Straight Leg Raise  - 1 x daily - 7 x weekly - 2 sets - 10 reps - Supine 90/90 Alternating Toe Touch  - 1 x daily - 7 x weekly - 2 sets - 10 reps - Sidelying Thoracic Rotation with Open Book  - 1 x daily - 7 x weekly - 2 sets - 10 reps   ASSESSMENT:  CLINICAL IMPRESSION: Patient reports overall improvement in low back pain. Continued with manual work to reduce tautness about lumbar musculature with good tolerance. Progressed core stabilization with patient having mild difficulty maintaining lumbopelvic stability with quadruped progression. Introduced Architectural technologist with patient able to demonstrate proper seated hip hinge without onset of back pain.     RE-EVAL: Re-assessment of the Lt knee and re-evaluation performed for new referral for acute bilateral low back pain without sciatica. She has progressed well in regards to her Lt knee reporting occasional mild levels of pain with full ROM and strength about the Lt knee. She has met majority of goals related to the knee, with exception of hip strength goal. We discussed continuing with HEP 2-3 x weekly for maintenance/independent progression and that we will be focusing our treatment on the acute low back pain which began at the beginning of March from repetitive coughing due to COVID. Upon assessment she is noted to have good lumbar AROM, though experiences pain with extension, rotation, and Rt lateral flexion. She has tautness about lumbar paraspinals and L-spine hypomobility with pain noted during PAIVM L2-L4. She has hip and core weakness and  reports increased pain with functional bending and pushing tasks at work. She will benefit from skilled PT to address the above stated deficits in order to optimize her function and assist in overall pain reduction.   From eval: Patient is a 63 y.o. F who was seen today for physical therapy evaluation and treatment for L knee meniscus tear. PMH significant for Frieburg's on R foot. Assessment demos generally good L knee quad strength; however, decreased lateral and medial knee stability with her pain most notable during twisting/rotary motions. Pt demos decreased hip adductor, abd and  glute/extensor strength affecting her tolerance for prolonged standing and walking tasks. Pt will benefit from PT to address these deficits for return to normal function.   OBJECTIVE IMPAIRMENTS: Abnormal gait, decreased activity tolerance, decreased endurance, decreased mobility, difficulty walking, decreased ROM, decreased strength, increased edema, increased fascial restrictions, improper body mechanics, and pain.     GOALS: Goals reviewed with patient? Yes  SHORT TERM GOALS: Target date: 12/26/2023  Pt will be ind with initial HEP Baseline: Goal status: MET  2.  Pt will demo L = R knee ROM Baseline:  Goal status: MET   LONG TERM GOALS: Target date: 03/07/24   Pt will be ind with management and progression of HEP for full return to her normal gym activities (I.e. rowing and biking) Baseline:  Goal status: partially met (independent with knee HEP), issued back HEP   2.  Pt will report >/=75% improvement in her overall knee pain with activity Baseline:  01/22/24: reports 75% improvement  Goal status: MET  3.  Pt will demo improved 5x STS to </=10 sec for increased functional LE strength Baseline:  Goal status: MET  4.  Pt will demo at least 4+/5 hip abductor and extensor for improved knee and lumbopelvic stability.  Baseline:  Goal status: REVISED   5.  Pt will have improved LEFS to >/=56/80 to  demo MCID Baseline:  Goal status: MET  6. Patient will be able to sustained bent over lunge positioning for at least 30 seconds without pain to improve tolerance to work specific activity.   Baseline: 6/10 pain with bedside work   Goal status: NEW  7. Patient will demonstrate pain free lumbar AROM to improve ability to complete reaching and bending tasks.   Baseline: see AROM above  Goal Status: NEW    PLAN:  PT FREQUENCY: 2x/week  PT DURATION: 6 weeks  PLANNED INTERVENTIONS: 97164- PT Re-evaluation, 97110-Therapeutic exercises, 97530- Therapeutic activity, [id number]- Neuromuscular re-education, 97535- Self Care, [id number]- Manual therapy, [id number]- Gait training, [phone]- Aquatic Therapy, [phone]- Electrical stimulation (unattended), [phone]- Electrical stimulation (manual), [id number]- Vasopneumatic device, [id number]- Ultrasound, [id number]- Traction (mechanical), [id number]- Ionotophoresis 4mg /ml Dexamethasone, Patient/Family education, Balance training, Stair training, Taping, Dry Needling, Joint mobilization, Spinal mobilization, Cryotherapy, and Moist heat  PLAN FOR NEXT SESSION: Progress HEP prn; manual to L-spine as needed (is getting massage on 4/4, so maybe hold manual at next visit); core stabilization. Lifting mechanics (hip hinge in standing!)   Letitia Libra, PT, DPT, ATC 01/28/24 4:18 PM

## 2024-01-29 ENCOUNTER — Encounter: Payer: Self-pay | Admitting: Family Medicine

## 2024-01-29 ENCOUNTER — Ambulatory Visit: Admitting: Family Medicine

## 2024-01-29 VITALS — BP 138/77 | HR 64 | Temp 98.3°F | Ht 65.0 in | Wt 169.0 lb

## 2024-01-29 DIAGNOSIS — R7303 Prediabetes: Secondary | ICD-10-CM

## 2024-01-29 DIAGNOSIS — Z6828 Body mass index (BMI) 28.0-28.9, adult: Secondary | ICD-10-CM | POA: Diagnosis not present

## 2024-01-29 DIAGNOSIS — E663 Overweight: Secondary | ICD-10-CM | POA: Diagnosis not present

## 2024-01-29 NOTE — Progress Notes (Signed)
 Office: [phone]  /  Fax: [phone]  WEIGHT SUMMARY AND BIOMETRICS  Starting Date: 04/16/23  Starting Weight: 188lb   Weight Lost Since Last Visit: 4lb   Vitals Temp: 98.3 F (36.8 C) BP: 138/77 Pulse Rate: 64 SpO2: 98 %   Body Composition  Body Fat %: 40.8 % Fat Mass (lbs): 69.2 lbs Muscle Mass (lbs): 95.4 lbs Total Body Water (lbs): 67.2 lbs Visceral Fat Rating : 10     HPI  Chief Complaint: OBESITY  Danielle Braun is here to discuss her progress with her obesity treatment plan. She is on the the Category 2 Plan and states she is following her eating plan approximately 80 % of the time. She states she is exercising 40 minutes 4 times per week.   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 Body mass index is 28.12 kg/m.  General: She is healthy appearing,  cooperative, alert, well developed, and in no acute distress. PSYCH: Has normal mood, affect and thought process.   Lungs: Normal breathing effort, no conversational dyspnea.   ASSESSMENT AND PLAN  TREATMENT PLAN FOR OBESITY:  Recommended Dietary Goals  Danielle Braun is currently in the  action stage of change. As such, her goal is to continue weight management plan. She has agreed to the Category 2 Plan.  Behavioral Intervention  We discussed the following Behavioral Modification Strategies today: increasing lean protein intake to established goals, increasing fiber rich foods, increasing water intake , work on meal planning and preparation, practice mindfulness eating and understand the difference between hunger signals and cravings, work on managing stress, creating time for self-care and relaxation, avoiding temptations and identifying enticing environmental cues, and continue to work on maintaining a reduced calorie state, getting the recommended amount of protein, incorporating whole foods, making healthy choices, staying well hydrated and practicing mindfulness when eating..  Additional resources provided today: NA  Recommended Physical Activity Goals  Danielle Braun has been advised to work up to 150 minutes of moderate intensity aerobic activity a week and strengthening exercises 2-3 times per week for cardiovascular health, weight loss maintenance and preservation of muscle mass.   She has agreed to [name] strengthening exercises with a goal of 2-3 sessions a week  and Increase the intensity, frequency or duration of aerobic exercises   Add in resistance training from home 15 min 2 x a week  Pharmacotherapy changes for the treatment of obesity: none  ASSOCIATED CONDITIONS ADDRESSED TODAY  Prediabetes Lab Results  Component Value Date   HGBA1C 5.7 (H) 04/16/2023  Anticipate improvements in A1c with  10% TBW loss in the past 9 mos of medically supervised weight management.  Continue to work on reducing added sugar and refined carbohydrates.  With her higher level of physical activity, she can add in 2 servings of whole food carbohydrate (potatoes, rice, fruit, beans).  Continue regular exercise.  Aim for a BF % 35 or under  Overweight (BMI 25.0-29.9)  BMI  28.0-28.9,adult      She was informed of the importance of frequent follow up visits to maximize her success with intensive lifestyle modifications for her multiple health conditions.   ATTESTASTION STATEMENTS:  Reviewed by clinician on day of visit: allergies, medications, problem list, medical history, surgical history, family history, social history, and previous encounter notes pertinent to obesity diagnosis.   I have personally spent 30 minutes total time today in preparation, patient care, nutritional counseling and documentation for this visit, including the following: review of clinical lab tests; review of medical tests/procedures/services.      Danielle Brink, DO DABFM, DABOM [facility] [address] [place], [place] [address] [phone]

## 2024-01-30 ENCOUNTER — Ambulatory Visit

## 2024-01-30 DIAGNOSIS — R2689 Other abnormalities of gait and mobility: Secondary | ICD-10-CM | POA: Diagnosis not present

## 2024-01-30 DIAGNOSIS — M5459 Other low back pain: Secondary | ICD-10-CM | POA: Diagnosis not present

## 2024-01-30 DIAGNOSIS — M25562 Pain in left knee: Secondary | ICD-10-CM | POA: Diagnosis not present

## 2024-01-30 DIAGNOSIS — R262 Difficulty in walking, not elsewhere classified: Secondary | ICD-10-CM | POA: Diagnosis not present

## 2024-01-30 DIAGNOSIS — M6281 Muscle weakness (generalized): Secondary | ICD-10-CM | POA: Diagnosis not present

## 2024-01-30 DIAGNOSIS — R252 Cramp and spasm: Secondary | ICD-10-CM | POA: Diagnosis not present

## 2024-01-30 DIAGNOSIS — M25561 Pain in right knee: Secondary | ICD-10-CM | POA: Diagnosis not present

## 2024-01-30 DIAGNOSIS — M25661 Stiffness of right knee, not elsewhere classified: Secondary | ICD-10-CM | POA: Diagnosis not present

## 2024-01-30 NOTE — Therapy (Signed)
 OUTPATIENT PHYSICAL THERAPY TREATMENT    Patient Name: LEONORA GORES MRN: [id number] DOB:Aug 27, 1961, 63 y.o., female Today's Date: 01/30/2024  END OF SESSION:  PT End of Session - 01/30/24 1540     Visit Number 14    Number of Visits 23    Date for PT Re-Evaluation 03/07/24    Authorization Type [facility] [employer]    PT Start Time 1535    PT Stop Time 1616    PT Time Calculation (min) 41 min    Activity Tolerance Patient tolerated treatment well    Behavior During Therapy WFL for tasks assessed/performed            Past Medical History:  Diagnosis Date   Acne    Arrhythmia    Bursitis, subacromial    Left   Endometriosis    GERD (gastroesophageal reflux disease)    Joint pain    Palpitations    Retinal tear    Sinusitis    SOB (shortness of breath)    Vitamin D deficiency    Past Surgical History:  Procedure Laterality Date   ABDOMINAL HYSTERECTOMY     ABDOMINAL SURGERY     csection   DILATION AND CURETTAGE OF UTERUS     SHOULDER ARTHROSCOPY WITH SUBACROMIAL DECOMPRESSION Left 10/31/2017   Procedure: SHOULDER ARTHROSCOPY WITH BURSECTOMY AND SUBACROMIAL DECOMPRESSION;  Surgeon: Jones Broom, MD;  Location: [facility] OR;  Service: Orthopedics;  Laterality: Left;  Left shoulder arthroscopy bursectomy and subacromial decompression   TONSILLECTOMY     Patient Active Problem List   Diagnosis Date Noted   Acute low back pain 01/21/2024   COVID 12/26/2023   Chronic cough 12/26/2023   Deviated nasal septum 12/26/2023   Hypertrophy of nasal turbinates 12/26/2023   Traumatic tear of meniscus of left knee 11/15/2023   Prediabetes 04/17/2023   History of colonic polyps 04/17/2023   Other fatigue 04/16/2023   SOB (shortness of breath) on exertion 04/16/2023   Health care maintenance 04/16/2023   Family history of diabetes mellitus 04/16/2023   Depression screening 04/16/2023   Generalized obesity 04/16/2023   BMI 30.0-30.9,adult 04/16/2023   Seasonal allergies  11/07/2022   Family history of osteoporosis 11/06/2022   Postmenopause 11/06/2022   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   Obesity (BMI 30-39.9) 01/26/2022   Patellofemoral pain syndrome of right knee 01/17/2022   Freiberg's disease, right 11/09/2021   Age-related osteoporosis without current pathological fracture 11/01/2021   Chronic sinusitis, unspecified 11/01/2021   Retinal tear of left eye 07/05/2021   History of laparoscopic-assisted vaginal hysterectomy 03/31/2021   History of sacrocolpopexy 03/31/2021   Blepharitis of upper and lower eyelids of both eyes 08/23/2020   Dry eye syndrome of both lacrimal glands 08/23/2020   Nuclear sclerotic cataract of both eyes 08/23/2020   Primary refluxing megaureter 03/02/2020   Sensation of feeling hot 01/18/2020   Chronic left shoulder pain 11/11/2018   Vitamin D insufficiency 11/11/2018   Low bone mass 09/26/2017   Immune to measles 02/20/2016   Allergic rhinitis 02/06/2016   GERD (gastroesophageal reflux disease) 02/06/2016   Hyperlipemia 02/06/2016   Keratoconjunctivitis sicca of both eyes not specified as Sjogren's 02/06/2016   Myopia of both eyes with astigmatism 02/06/2016   Posterior vitreous detachment of left eye 02/06/2016   Premature atrial contraction 02/06/2016   History of right salpingo-oophorectomy 03/14/2010   Acute recurrent maxillary sinusitis 11/06/2008    PCP: Loyal Jacobson, MD  REFERRING PROVIDER: Monica Becton, MD  REFERRING DIAG:  [phone] (  ICD-10-CM) - Traumatic tear of meniscus of left knee, subsequent encounter M54.50 (ICD-10-CM) - Acute bilateral low back pain without sciatica      THERAPY DIAG:  Muscle weakness (generalized)  Other abnormalities of gait and mobility  Other low back pain  Rationale for Evaluation and Treatment: Rehabilitation  ONSET DATE: ~3 weeks ago  SUBJECTIVE:   SUBJECTIVE STATEMENT: Patient reports she continues to have occasional back pain with  lifting and activities that require "forward force".   From eval: Pt states she's had issues with both legs in the last 2 years. Pt was having L hip and leg pain in December. Pt was walking and her knee gave way ~3 weeks ago. Pt states she has been able to do her 12 hour shifts and be careful. Pt reports pain was not so bad she needed an injection.   PERTINENT HISTORY: Frieburgs disease R foot (AVN of metatarsal), OA R knee   PAIN:  Are you having pain? Yes: NPRS scale: none currently; at worst 1/10  Pain location: low back Pain description: twinge Aggravating factors: end of 12 hour shift, twisting (knee); bending, valsalva (back)  Relieving factors: tylenol, ice and knee brace  PRECAUTIONS: None  RED FLAGS: None   WEIGHT BEARING RESTRICTIONS: No  FALLS:  Has patient fallen in last 6 months? No  LIVING ENVIRONMENT: Lives with: lives with their spouse Lives in: House/apartment Stairs: Yes: External: 2 steps; none Has following equipment at home: None  OCCUPATION: ICU nurse at [employer]  PATIENT GOALS: Return to normal activities (pt bikes and rows x20 min each)  NEXT MD VISIT: 03/03/24  OBJECTIVE:  Note: Objective measures were completed at Evaluation unless otherwise noted.  DIAGNOSTIC FINDINGS: MRI 11/19/23 L knee IMPRESSION: 1. Complete tear at the root of the posterior horn of the medial meniscus, with near absence of meniscal tissue within the 3 mm of the posterior horn closest to the root. Associated mild extrusion of the body of the medial meniscus. 2. Tricompartmental cartilage degenerative changes, severe within the lateral patellofemoral cartilage. 3. Small joint effusion.  PATIENT SURVEYS:  Lower Extremity Functional Score: 47 / 80 = 58.8 % 01/22/24: 59/80.   EDEMA:  "A little edema"  MUSCLE LENGTH: Hamstrings: Left slightly tighter than Right Thomas test: Did not assess  PALPATION: Mostly tender about medial L knee joint line and along edema  around medial patella  01/22/24: L spine hypomobility, pain at L2-L4 PAIVM; tautness bilateral lumbar paraspinals   LOWER EXTREMITY ROM:  Active ROM Right eval Left eval 12/31/23   Hip flexion     Hip extension     Hip abduction     Hip adduction     Hip internal rotation     Hip external rotation     Knee flexion 125 125 Lt: 140; Rt: 140   Knee extension 0 -5 Lt: 0; Rt: 0   Ankle dorsiflexion     Ankle plantarflexion     Ankle inversion     Ankle eversion      (Blank rows = not tested)  LOWER EXTREMITY MMT:  MMT Right eval Left eval 01/22/24  Hip flexion 5 4* 5 bilateral; pain in low back with RLE  Hip extension 4 3- 4 bilateral   Hip abduction 5 3+ 4 bilateral   Hip adduction 5 3+ 4+ bilateral   Hip internal rotation     Hip external rotation     Knee flexion 5 5   Knee extension 5 5  Ankle dorsiflexion     Ankle plantarflexion     Ankle inversion     Ankle eversion      (Blank rows = not tested, * = pain) LUMBAR ROM:   Active  AROM  01/22/24  Flexion Full   Extension Full pain center of low back  Right lateral flexion WNL pain Rt low back  Left lateral flexion WNL  Right rotation WNL low back pain  Left rotation WNL low back pain   (Blank rows = not tested)  LOWER EXTREMITY SPECIAL TESTS:  Hip special tests: Luisa Hart (FABER) test: negative and Craig's test: negative  FUNCTIONAL TESTS:  5x STS: 15.92 sec SLS: R 20 sec, L 19 sec  01/22/24: 5 x STS: 8.74 sec   GAIT: Distance walked: Into clinic Assistive device utilized: L knee brace Level of assistance: Complete Independence Comments: Mildly antalgic with L knee slightly kept in flexion   [facility] Adult PT Treatment:                                                DATE: 01/30/2024 Therapeutic Exercise: LTR Dynamic HS stretch + ankle pumps x10 (B) Quadruped: Cat/cow Rock back  Lateral cat in neutral spine Child's pose front & diagonals Wide leg LTR Figure 4 LTR Neuromuscular re-ed: Unilateral  90/90 isometric press + TA activation Isometric dead bug Dead bug --> proprioceptive awareness and core bracing Quadruped: Hip extension  Single arm raises Bird dog Therapeutic Activity: Squat deadlift --> dowel cueing hip hinge Squat + reach across midline with 3#DB Staggered stance squat with fwd weight shifting --> 3#DB press into wall    [facility] Adult PT Treatment:                                                DATE: 01/28/24 Manual Therapy: Silicone cupping to lumbar paraspinals, QL  STM lumbar paraspinals, QL Neuromuscular re-ed: Supine TA march 2 x 10  SLR with posterior pelvic tilt 2 x 10  90/90 toe taps 2 x 10  Quadruped arm reach x 10  Quadruped leg lift 2 x 10  Seated hip hinge with dowel x 10     [facility] Adult PT Treatment:                                                DATE: 01/24/24 Therapeutic Exercise: LTR with figure 4 x 1 min each  SKTC x 10 each; 5 sec hold  Open book x 10 each  Cat/cow x  10  Manual Therapy: STM/DTM lumbar paraspinals, QL L-spine mobilization grade II-III Neuromuscular re-ed: Supine TA march 2 x 10  Supine bent knee fallout 2 x 10           PATIENT EDUCATION:  Education details: HEP update  Person educated: Patient Education method: Programmer, multimedia, demo, cues, handout Education comprehension: verbalized understanding, returned demo, cues   HOME EXERCISE PROGRAM: Access Code: [email or web address] URL: [email or web address] Date: 01/14/2024 Prepared by: Letitia Libra Instructed to complete 2-3 times weekly.   Exercises - Runner's Step Up/Down  - 1 x daily - 7 x  weekly - 2 sets - 10 reps - Bridge with Hip Abduction and Resistance  - 1 x daily - 7 x weekly - 2 sets - 10 reps - Forward Step Down Touch with Heel  - 1 x daily - 7 x weekly - 2 sets - 10 reps - The Diver  - 1 x daily - 7 x weekly - 2 sets - 10 reps - Lunge Matrix on Slider  - 1 x daily - 7 x weekly - 2 sets - 10 reps - Standing 3-way Hip with Walker  - 1 x daily - 7  x weekly - 2 sets - 10 reps - Side Stepping with Resistance at Ankles  - 1 x daily - 7 x weekly - 2 sets - 10 reps - Backward Band Walks with Resistance at Thighs and Ankles  - 1 x daily - 7 x weekly - 2 sets - 10 reps - Forward Band Walks with Resistance at Thighs and Ankles  - 1 x daily - 7 x weekly - 2 sets - 10 reps  Access Code: [email or web address] URL: [email or web address] Date: 01/30/2024 Prepared by: Carlynn Herald  Exercises - Supine Lower Trunk Rotation  - 1 x daily - 7 x weekly - 1 sets - 10 reps - Cat Cow  - 1 x daily - 7 x weekly - 1 sets - 10 reps - Supine Figure 4 Piriformis Stretch  - 1 x daily - 7 x weekly - 3 sets - 30  hold - Supine Pelvic Tilt with Straight Leg Raise  - 1 x daily - 7 x weekly - 2 sets - 10 reps - Supine 90/90 Alternating Toe Touch  - 1 x daily - 7 x weekly - 2 sets - 10 reps - Sidelying Thoracic Rotation with Open Book  - 1 x daily - 7 x weekly - 2 sets - 10 reps - Supine Dead Bug with Leg Extension  - 1 x daily - 7 x weekly - 3 sets - 10 reps - Bird Dog  - 1 x daily - 7 x weekly - 3 sets - 10 reps   ASSESSMENT:  CLINICAL IMPRESSION:  Core strengthening challenged with dead bug exercise progression from isometric to dynamic reciprocal movement. Hip hinge mechanics cued with dowel placed at front of pelvic improved patient's postural alignment. Weight reach across midline added with squats to mimic work activities; cueing provided to improve squat mechanics and decrease back tension.   RE-EVAL: Re-assessment of the Lt knee and re-evaluation performed for new referral for acute bilateral low back pain without sciatica. She has progressed well in regards to her Lt knee reporting occasional mild levels of pain with full ROM and strength about the Lt knee. She has met majority of goals related to the knee, with exception of hip strength goal. We discussed continuing with HEP 2-3 x weekly for maintenance/independent progression and that we will be focusing  our treatment on the acute low back pain which began at the beginning of March from repetitive coughing due to COVID. Upon assessment she is noted to have good lumbar AROM, though experiences pain with extension, rotation, and Rt lateral flexion. She has tautness about lumbar paraspinals and L-spine hypomobility with pain noted during PAIVM L2-L4. She has hip and core weakness and reports increased pain with functional bending and pushing tasks at work. She will benefit from skilled PT to address the above stated deficits in order to optimize her function and assist in overall pain reduction.  From eval: Patient is a 63 y.o. F who was seen today for physical therapy evaluation and treatment for L knee meniscus tear. PMH significant for Frieburg's on R foot. Assessment demos generally good L knee quad strength; however, decreased lateral and medial knee stability with her pain most notable during twisting/rotary motions. Pt demos decreased hip adductor, abd and glute/extensor strength affecting her tolerance for prolonged standing and walking tasks. Pt will benefit from PT to address these deficits for return to normal function.   OBJECTIVE IMPAIRMENTS: Abnormal gait, decreased activity tolerance, decreased endurance, decreased mobility, difficulty walking, decreased ROM, decreased strength, increased edema, increased fascial restrictions, improper body mechanics, and pain.     GOALS: Goals reviewed with patient? Yes  SHORT TERM GOALS: Target date: 12/26/2023  Pt will be ind with initial HEP Baseline: Goal status: MET  2.  Pt will demo L = R knee ROM Baseline:  Goal status: MET   LONG TERM GOALS: Target date: 03/07/24  Pt will be ind with management and progression of HEP for full return to her normal gym activities (I.e. rowing and biking) Baseline:  Goal status: partially met (independent with knee HEP), issued back HEP   2.  Pt will report >/=75% improvement in her overall knee pain with  activity Baseline:  01/22/24: reports 75% improvement  Goal status: MET  3.  Pt will demo improved 5x STS to </=10 sec for increased functional LE strength Baseline:  Goal status: MET  4.  Pt will demo at least 4+/5 hip abductor and extensor for improved knee and lumbopelvic stability.  Baseline:  Goal status: REVISED   5.  Pt will have improved LEFS to >/=56/80 to demo MCID Baseline:  Goal status: MET  6. Patient will be able to sustained bent over lunge positioning for at least 30 seconds without pain to improve tolerance to work specific activity.   Baseline: 6/10 pain with bedside work   Goal status: NEW  7. Patient will demonstrate pain free lumbar AROM to improve ability to complete reaching and bending tasks.   Baseline: see AROM above  Goal Status: NEW    PLAN:  PT FREQUENCY: 2x/week  PT DURATION: 6 weeks  PLANNED INTERVENTIONS: [id number]- PT Re-evaluation, 97110-Therapeutic exercises, 97530- Therapeutic activity, [id number]- Neuromuscular re-education, 97535- Self Care, [id number]- Manual therapy, [id number]- Gait training, [phone]- Aquatic Therapy, [phone]- Electrical stimulation (unattended), [phone]- Electrical stimulation (manual), [id number]- Vasopneumatic device, [id number]- Ultrasound, [id number]- Traction (mechanical), [id number]- Ionotophoresis 4mg /ml Dexamethasone, Patient/Family education, Balance training, Stair training, Taping, Dry Needling, Joint mobilization, Spinal mobilization, Cryotherapy, and Moist heat  PLAN FOR NEXT SESSION: Progress HEP prn (back); manual to L-spine as needed; core stabilization. Lifting mechanics (hip hinge in standing!); squatting with hand fwd pushing (plugging in electrical cord)  Carlynn Herald, PTA 01/30/24 4:22 PM

## 2024-02-03 ENCOUNTER — Ambulatory Visit

## 2024-02-03 DIAGNOSIS — M5459 Other low back pain: Secondary | ICD-10-CM

## 2024-02-03 DIAGNOSIS — R252 Cramp and spasm: Secondary | ICD-10-CM

## 2024-02-03 DIAGNOSIS — R262 Difficulty in walking, not elsewhere classified: Secondary | ICD-10-CM

## 2024-02-03 DIAGNOSIS — R2689 Other abnormalities of gait and mobility: Secondary | ICD-10-CM

## 2024-02-03 DIAGNOSIS — M25562 Pain in left knee: Secondary | ICD-10-CM | POA: Diagnosis not present

## 2024-02-03 DIAGNOSIS — M6281 Muscle weakness (generalized): Secondary | ICD-10-CM | POA: Diagnosis not present

## 2024-02-03 DIAGNOSIS — M25661 Stiffness of right knee, not elsewhere classified: Secondary | ICD-10-CM

## 2024-02-03 DIAGNOSIS — M25561 Pain in right knee: Secondary | ICD-10-CM | POA: Diagnosis not present

## 2024-02-03 NOTE — Therapy (Signed)
 OUTPATIENT PHYSICAL THERAPY TREATMENT    Patient Name: Danielle Braun MRN: [id number] DOB:1961-02-13, 63 y.o., female Today's Date: 02/03/2024  END OF SESSION:  PT End of Session - 02/03/24 1022     Visit Number 15    Number of Visits 23    Date for PT Re-Evaluation 03/07/24    Authorization Type [facility] [employer]    PT Start Time 1022    PT Stop Time 1100    PT Time Calculation (min) 38 min    Activity Tolerance Patient tolerated treatment well    Behavior During Therapy WFL for tasks assessed/performed            Past Medical History:  Diagnosis Date   Acne    Arrhythmia    Bursitis, subacromial    Left   Endometriosis    GERD (gastroesophageal reflux disease)    Joint pain    Palpitations    Retinal tear    Sinusitis    SOB (shortness of breath)    Vitamin D deficiency    Past Surgical History:  Procedure Laterality Date   ABDOMINAL HYSTERECTOMY     ABDOMINAL SURGERY     csection   DILATION AND CURETTAGE OF UTERUS     SHOULDER ARTHROSCOPY WITH SUBACROMIAL DECOMPRESSION Left 10/31/2017   Procedure: SHOULDER ARTHROSCOPY WITH BURSECTOMY AND SUBACROMIAL DECOMPRESSION;  Surgeon: Jones Broom, MD;  Location: [facility] OR;  Service: Orthopedics;  Laterality: Left;  Left shoulder arthroscopy bursectomy and subacromial decompression   TONSILLECTOMY     Patient Active Problem List   Diagnosis Date Noted   Acute low back pain 01/21/2024   COVID 12/26/2023   Chronic cough 12/26/2023   Deviated nasal septum 12/26/2023   Hypertrophy of nasal turbinates 12/26/2023   Traumatic tear of meniscus of left knee 11/15/2023   Prediabetes 04/17/2023   History of colonic polyps 04/17/2023   Other fatigue 04/16/2023   SOB (shortness of breath) on exertion 04/16/2023   Health care maintenance 04/16/2023   Family history of diabetes mellitus 04/16/2023   Depression screening 04/16/2023   Generalized obesity 04/16/2023   BMI 30.0-30.9,adult 04/16/2023   Seasonal allergies  11/07/2022   Family history of osteoporosis 11/06/2022   Postmenopause 11/06/2022   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   Obesity (BMI 30-39.9) 01/26/2022   Patellofemoral pain syndrome of right knee 01/17/2022   Freiberg's disease, right 11/09/2021   Age-related osteoporosis without current pathological fracture 11/01/2021   Chronic sinusitis, unspecified 11/01/2021   Retinal tear of left eye 07/05/2021   History of laparoscopic-assisted vaginal hysterectomy 03/31/2021   History of sacrocolpopexy 03/31/2021   Blepharitis of upper and lower eyelids of both eyes 08/23/2020   Dry eye syndrome of both lacrimal glands 08/23/2020   Nuclear sclerotic cataract of both eyes 08/23/2020   Primary refluxing megaureter 03/02/2020   Sensation of feeling hot 01/18/2020   Chronic left shoulder pain 11/11/2018   Vitamin D insufficiency 11/11/2018   Low bone mass 09/26/2017   Immune to measles 02/20/2016   Allergic rhinitis 02/06/2016   GERD (gastroesophageal reflux disease) 02/06/2016   Hyperlipemia 02/06/2016   Keratoconjunctivitis sicca of both eyes not specified as Sjogren's 02/06/2016   Myopia of both eyes with astigmatism 02/06/2016   Posterior vitreous detachment of left eye 02/06/2016   Premature atrial contraction 02/06/2016   History of right salpingo-oophorectomy 03/14/2010   Acute recurrent maxillary sinusitis 11/06/2008    PCP: Loyal Jacobson, MD  REFERRING PROVIDER: Monica Becton, MD  REFERRING DIAG:  [phone] (  ICD-10-CM) - Traumatic tear of meniscus of left knee, subsequent encounter M54.50 (ICD-10-CM) - Acute bilateral low back pain without sciatica      THERAPY DIAG:  Muscle weakness (generalized)  Other abnormalities of gait and mobility  Other low back pain  Acute pain of left knee  Acute pain of right knee  Stiffness of right knee, not elsewhere classified  Cramp and spasm  Difficulty in walking, not elsewhere classified  Rationale for  Evaluation and Treatment: Rehabilitation  ONSET DATE: ~3 weeks ago  SUBJECTIVE:   SUBJECTIVE STATEMENT: Patient reports she heard back from MD office who reviewed x-ray results with her; she has a compression defect at L3 and was told to continues PT and defect will heal on own over time.   From eval: Pt states she's had issues with both legs in the last 2 years. Pt was having L hip and leg pain in December. Pt was walking and her knee gave way ~3 weeks ago. Pt states she has been able to do her 12 hour shifts and be careful. Pt reports pain was not so bad she needed an injection.   PERTINENT HISTORY: Frieburgs disease R foot (AVN of metatarsal), OA R knee   PAIN:  Are you having pain? Yes: NPRS scale: none currently; at worst 1/10  Pain location: low back Pain description: twinge Aggravating factors: end of 12 hour shift, twisting (knee); bending, valsalva (back)  Relieving factors: tylenol, ice and knee brace  PRECAUTIONS: None  RED FLAGS: None   WEIGHT BEARING RESTRICTIONS: No  FALLS:  Has patient fallen in last 6 months? No  LIVING ENVIRONMENT: Lives with: lives with their spouse Lives in: House/apartment Stairs: Yes: External: 2 steps; none Has following equipment at home: None  OCCUPATION: ICU nurse at [employer]  PATIENT GOALS: Return to normal activities (pt bikes and rows x20 min each)  NEXT MD VISIT: 03/03/24  OBJECTIVE:  Note: Objective measures were completed at Evaluation unless otherwise noted.  DIAGNOSTIC FINDINGS: MRI 11/19/23 L knee IMPRESSION: 1. Complete tear at the root of the posterior horn of the medial meniscus, with near absence of meniscal tissue within the 3 mm of the posterior horn closest to the root. Associated mild extrusion of the body of the medial meniscus. 2. Tricompartmental cartilage degenerative changes, severe within the lateral patellofemoral cartilage. 3. Small joint effusion.  PATIENT SURVEYS:  Lower Extremity Functional  Score: 47 / 80 = 58.8 % 01/22/24: 59/80.   EDEMA:  "A little edema"  MUSCLE LENGTH: Hamstrings: Left slightly tighter than Right Thomas test: Did not assess  PALPATION: Mostly tender about medial L knee joint line and along edema around medial patella  01/22/24: L spine hypomobility, pain at L2-L4 PAIVM; tautness bilateral lumbar paraspinals   LOWER EXTREMITY ROM:  Active ROM Right eval Left eval 12/31/23   Hip flexion     Hip extension     Hip abduction     Hip adduction     Hip internal rotation     Hip external rotation     Knee flexion 125 125 Lt: 140; Rt: 140   Knee extension 0 -5 Lt: 0; Rt: 0   Ankle dorsiflexion     Ankle plantarflexion     Ankle inversion     Ankle eversion      (Blank rows = not tested)  LOWER EXTREMITY MMT:  MMT Right eval Left eval 01/22/24  Hip flexion 5 4* 5 bilateral; pain in low back with RLE  Hip  extension 4 3- 4 bilateral   Hip abduction 5 3+ 4 bilateral   Hip adduction 5 3+ 4+ bilateral   Hip internal rotation     Hip external rotation     Knee flexion 5 5   Knee extension 5 5   Ankle dorsiflexion     Ankle plantarflexion     Ankle inversion     Ankle eversion      (Blank rows = not tested, * = pain) LUMBAR ROM:   Active  AROM  01/22/24  Flexion Full   Extension Full pain center of low back  Right lateral flexion WNL pain Rt low back  Left lateral flexion WNL  Right rotation WNL low back pain  Left rotation WNL low back pain   (Blank rows = not tested)  LOWER EXTREMITY SPECIAL TESTS:  Hip special tests: Luisa Hart (FABER) test: negative and Craig's test: negative  FUNCTIONAL TESTS:  5x STS: 15.92 sec SLS: R 20 sec, L 19 sec  01/22/24: 5 x STS: 8.74 sec   GAIT: Distance walked: Into clinic Assistive device utilized: L knee brace Level of assistance: Complete Independence Comments: Mildly antalgic with L knee slightly kept in flexion   [facility] Adult PT Treatment:                                                DATE:  02/03/2024 Therapeutic Exercise: Supine: SKTC Modified piriformis stretch 5x10" LTR x 1 min Bridges + clamshells blue TB 2x15 Double leg stretch  Scissors S/L hydrants + blue TB 2x10 (B) Neuromuscular re-ed: Dead bug --> proprioceptive awareness and core bracing Quadruped: (postural stability and core bracing) Rock back Cat/cow Single arm raises +YTB Bird dog Therapeutic Activity: Squat + reach across midline with 4#DB Lung + shoulder extension press back with RTB x8 (B)    [facility] Adult PT Treatment:                                                DATE: 01/30/2024 Therapeutic Exercise: LTR Dynamic HS stretch + ankle pumps x10 (B) Quadruped: Cat/cow Rock back  Lateral cat in neutral spine Child's pose front & diagonals Wide leg LTR Figure 4 LTR Neuromuscular re-ed: Unilateral 90/90 isometric press + TA activation Isometric dead bug Dead bug --> proprioceptive awareness and core bracing Quadruped: Hip extension  Single arm raises Bird dog Therapeutic Activity: Squat deadlift --> dowel cueing hip hinge Squat + reach across midline with 3#DB Staggered stance squat with fwd weight shifting --> 3#DB press into wall   [facility] Adult PT Treatment:                                                DATE: 01/28/24 Manual Therapy: Silicone cupping to lumbar paraspinals, QL  STM lumbar paraspinals, QL Neuromuscular re-ed: Supine TA march 2 x 10  SLR with posterior pelvic tilt 2 x 10  90/90 toe taps 2 x 10  Quadruped arm reach x 10  Quadruped leg lift 2 x 10  Seated hip hinge with dowel x 10  PATIENT EDUCATION:  Education details: HEP update  Person educated: Patient Education method: Explanation, demo, cues, handout Education comprehension: verbalized understanding, returned demo, cues   HOME EXERCISE PROGRAM: Access Code: [email or web address] URL: [email or web address] Date: 01/14/2024 Prepared by: Letitia Libra Instructed to complete 2-3 times weekly.    Exercises - Runner's Step Up/Down  - 1 x daily - 7 x weekly - 2 sets - 10 reps - Bridge with Hip Abduction and Resistance  - 1 x daily - 7 x weekly - 2 sets - 10 reps - Forward Step Down Touch with Heel  - 1 x daily - 7 x weekly - 2 sets - 10 reps - The Diver  - 1 x daily - 7 x weekly - 2 sets - 10 reps - Lunge Matrix on Slider  - 1 x daily - 7 x weekly - 2 sets - 10 reps - Standing 3-way Hip with Walker  - 1 x daily - 7 x weekly - 2 sets - 10 reps - Side Stepping with Resistance at Ankles  - 1 x daily - 7 x weekly - 2 sets - 10 reps - Backward Band Walks with Resistance at Thighs and Ankles  - 1 x daily - 7 x weekly - 2 sets - 10 reps - Forward Band Walks with Resistance at Thighs and Ankles  - 1 x daily - 7 x weekly - 2 sets - 10 reps  Access Code: [email or web address] URL: [email or web address] Date: 01/30/2024 Prepared by: Carlynn Herald  Exercises - Supine Lower Trunk Rotation  - 1 x daily - 7 x weekly - 1 sets - 10 reps - Cat Cow  - 1 x daily - 7 x weekly - 1 sets - 10 reps - Supine Figure 4 Piriformis Stretch  - 1 x daily - 7 x weekly - 3 sets - 30  hold - Supine Pelvic Tilt with Straight Leg Raise  - 1 x daily - 7 x weekly - 2 sets - 10 reps - Supine 90/90 Alternating Toe Touch  - 1 x daily - 7 x weekly - 2 sets - 10 reps - Sidelying Thoracic Rotation with Open Book  - 1 x daily - 7 x weekly - 2 sets - 10 reps - Supine Dead Bug with Leg Extension  - 1 x daily - 7 x weekly - 3 sets - 10 reps - Bird Dog  - 1 x daily - 7 x weekly - 3 sets - 10 reps   ASSESSMENT:  CLINICAL IMPRESSION:  Continued core and glute strengthening exercises, progressing with no exacerbation of pain or discomfort. Lunge and squat variations incorporated with resistance to simulate work-related activities. Tactile cues needed for postural awareness and to maintain hip hinge mechanics during squatting exercise.    RE-EVAL: Re-assessment of the Lt knee and re-evaluation performed for new referral for  acute bilateral low back pain without sciatica. She has progressed well in regards to her Lt knee reporting occasional mild levels of pain with full ROM and strength about the Lt knee. She has met majority of goals related to the knee, with exception of hip strength goal. We discussed continuing with HEP 2-3 x weekly for maintenance/independent progression and that we will be focusing our treatment on the acute low back pain which began at the beginning of March from repetitive coughing due to COVID. Upon assessment she is noted to have good lumbar AROM, though experiences pain with extension, rotation, and Rt lateral flexion. She has tautness about lumbar  paraspinals and L-spine hypomobility with pain noted during PAIVM L2-L4. She has hip and core weakness and reports increased pain with functional bending and pushing tasks at work. She will benefit from skilled PT to address the above stated deficits in order to optimize her function and assist in overall pain reduction.   From eval: Patient is a 63 y.o. F who was seen today for physical therapy evaluation and treatment for L knee meniscus tear. PMH significant for Frieburg's on R foot. Assessment demos generally good L knee quad strength; however, decreased lateral and medial knee stability with her pain most notable during twisting/rotary motions. Pt demos decreased hip adductor, abd and glute/extensor strength affecting her tolerance for prolonged standing and walking tasks. Pt will benefit from PT to address these deficits for return to normal function.   OBJECTIVE IMPAIRMENTS: Abnormal gait, decreased activity tolerance, decreased endurance, decreased mobility, difficulty walking, decreased ROM, decreased strength, increased edema, increased fascial restrictions, improper body mechanics, and pain.     GOALS: Goals reviewed with patient? Yes  SHORT TERM GOALS: Target date: 12/26/2023  Pt will be ind with initial HEP Baseline: Goal status:  MET  2.  Pt will demo L = R knee ROM Baseline:  Goal status: MET   LONG TERM GOALS: Target date: 03/07/24  Pt will be ind with management and progression of HEP for full return to her normal gym activities (I.e. rowing and biking) Baseline:  Goal status: partially met (independent with knee HEP), issued back HEP   2.  Pt will report >/=75% improvement in her overall knee pain with activity Baseline:  01/22/24: reports 75% improvement  Goal status: MET  3.  Pt will demo improved 5x STS to </=10 sec for increased functional LE strength Baseline:  Goal status: MET  4.  Pt will demo at least 4+/5 hip abductor and extensor for improved knee and lumbopelvic stability.  Baseline:  Goal status: REVISED   5.  Pt will have improved LEFS to >/=56/80 to demo MCID Baseline:  Goal status: MET  6. Patient will be able to sustained bent over lunge positioning for at least 30 seconds without pain to improve tolerance to work specific activity.   Baseline: 6/10 pain with bedside work   Goal status: NEW  7. Patient will demonstrate pain free lumbar AROM to improve ability to complete reaching and bending tasks.   Baseline: see AROM above  Goal Status: NEW    PLAN:  PT FREQUENCY: 2x/week  PT DURATION: 6 weeks  PLANNED INTERVENTIONS: [id number]- PT Re-evaluation, 97110-Therapeutic exercises, 97530- Therapeutic activity, [id number]- Neuromuscular re-education, 97535- Self Care, [id number]- Manual therapy, [id number]- Gait training, [phone]- Aquatic Therapy, [phone]- Electrical stimulation (unattended), [phone]- Electrical stimulation (manual), [id number]- Vasopneumatic device, [id number]- Ultrasound, [id number]- Traction (mechanical), [id number]- Ionotophoresis 4mg /ml Dexamethasone, Patient/Family education, Balance training, Stair training, Taping, Dry Needling, Joint mobilization, Spinal mobilization, Cryotherapy, and Moist heat  PLAN FOR NEXT SESSION: Progress HEP prn (back); manual to L-spine as needed; core stabilization. Lifting  mechanics (hip hinge in standing!); squatting with hand fwd pushing (plugging in electrical cord)  Carlynn Herald, PTA 02/03/24 11:23 AM

## 2024-02-06 ENCOUNTER — Ambulatory Visit

## 2024-02-06 DIAGNOSIS — M5459 Other low back pain: Secondary | ICD-10-CM

## 2024-02-06 DIAGNOSIS — M25561 Pain in right knee: Secondary | ICD-10-CM

## 2024-02-06 DIAGNOSIS — M25661 Stiffness of right knee, not elsewhere classified: Secondary | ICD-10-CM | POA: Diagnosis not present

## 2024-02-06 DIAGNOSIS — R2689 Other abnormalities of gait and mobility: Secondary | ICD-10-CM

## 2024-02-06 DIAGNOSIS — R252 Cramp and spasm: Secondary | ICD-10-CM

## 2024-02-06 DIAGNOSIS — M25562 Pain in left knee: Secondary | ICD-10-CM

## 2024-02-06 DIAGNOSIS — R262 Difficulty in walking, not elsewhere classified: Secondary | ICD-10-CM | POA: Diagnosis not present

## 2024-02-06 DIAGNOSIS — M6281 Muscle weakness (generalized): Secondary | ICD-10-CM | POA: Diagnosis not present

## 2024-02-06 NOTE — Therapy (Signed)
 OUTPATIENT PHYSICAL THERAPY TREATMENT    Patient Name: Danielle Braun MRN: [id number] DOB:[birth year], 63 y.o., female Today's Date: 02/06/2024  END OF SESSION:  PT End of Session - 02/06/24 1021     Visit Number 16    Number of Visits 23    Date for PT Re-Evaluation 03/07/24    Authorization Type [employer]    PT Start Time 1020    PT Stop Time 1100    PT Time Calculation (min) 40 min    Activity Tolerance Patient tolerated treatment well    Behavior During Therapy WFL for tasks assessed/performed            Past Medical History:  Diagnosis Date   Acne    Arrhythmia    Bursitis, subacromial    Left   Endometriosis    GERD (gastroesophageal reflux disease)    Joint pain    Palpitations    Retinal tear    Sinusitis    SOB (shortness of breath)    Vitamin D deficiency    Past Surgical History:  Procedure Laterality Date   ABDOMINAL HYSTERECTOMY     ABDOMINAL SURGERY     csection   DILATION AND CURETTAGE OF UTERUS     SHOULDER ARTHROSCOPY WITH SUBACROMIAL DECOMPRESSION Left 10/31/2017   Procedure: SHOULDER ARTHROSCOPY WITH BURSECTOMY AND SUBACROMIAL DECOMPRESSION;  Surgeon: Jones Broom, MD;  Location: [facility] OR;  Service: Orthopedics;  Laterality: Left;  Left shoulder arthroscopy bursectomy and subacromial decompression   TONSILLECTOMY     Patient Active Problem List   Diagnosis Date Noted   Acute low back pain 01/21/2024   COVID 12/26/2023   Chronic cough 12/26/2023   Deviated nasal septum 12/26/2023   Hypertrophy of nasal turbinates 12/26/2023   Traumatic tear of meniscus of left knee 11/15/2023   Prediabetes 04/17/2023   History of colonic polyps 04/17/2023   Other fatigue 04/16/2023   SOB (shortness of breath) on exertion 04/16/2023   Health care maintenance 04/16/2023   Family history of diabetes mellitus 04/16/2023   Depression screening 04/16/2023   Generalized obesity 04/16/2023   BMI 30.0-30.9,adult 04/16/2023   Seasonal allergies  11/07/2022   Family history of osteoporosis 11/06/2022   Postmenopause 11/06/2022   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   Obesity (BMI 30-39.9) 01/26/2022   Patellofemoral pain syndrome of right knee 01/17/2022   Freiberg's disease, right 11/09/2021   Age-related osteoporosis without current pathological fracture 11/01/2021   Chronic sinusitis, unspecified 11/01/2021   Retinal tear of left eye 07/05/2021   History of laparoscopic-assisted vaginal hysterectomy 03/31/2021   History of sacrocolpopexy 03/31/2021   Blepharitis of upper and lower eyelids of both eyes 08/23/2020   Dry eye syndrome of both lacrimal glands 08/23/2020   Nuclear sclerotic cataract of both eyes 08/23/2020   Primary refluxing megaureter 03/02/2020   Sensation of feeling hot 01/18/2020   Chronic left shoulder pain 11/11/2018   Vitamin D insufficiency 11/11/2018   Low bone mass 09/26/2017   Immune to measles 02/20/2016   Allergic rhinitis 02/06/2016   GERD (gastroesophageal reflux disease) 02/06/2016   Hyperlipemia 02/06/2016   Keratoconjunctivitis sicca of both eyes not specified as Sjogren's 02/06/2016   Myopia of both eyes with astigmatism 02/06/2016   Posterior vitreous detachment of left eye 02/06/2016   Premature atrial contraction 02/06/2016   History of right salpingo-oophorectomy 03/14/2010   Acute recurrent maxillary sinusitis 11/06/2008    PCP: Loyal Jacobson, MD  REFERRING PROVIDER: Monica Becton, MD  REFERRING DIAG:  [phone] (  ICD-10-CM) - Traumatic tear of meniscus of left knee, subsequent encounter M54.50 (ICD-10-CM) - Acute bilateral low back pain without sciatica      THERAPY DIAG:  Muscle weakness (generalized)  Other abnormalities of gait and mobility  Other low back pain  Acute pain of left knee  Acute pain of right knee  Stiffness of right knee, not elsewhere classified  Cramp and spasm  Difficulty in walking, not elsewhere classified  Rationale for  Evaluation and Treatment: Rehabilitation  ONSET DATE: ~3 weeks ago  SUBJECTIVE:   SUBJECTIVE STATEMENT: Patient reports her low back is very achy today; states she had a rough shift yesterday.   From eval: Pt states she's had issues with both legs in the last 2 years. Pt was having L hip and leg pain in December. Pt was walking and her knee gave way ~3 weeks ago. Pt states she has been able to do her 12 hour shifts and be careful. Pt reports pain was not so bad she needed an injection.   PERTINENT HISTORY: Frieburgs disease R foot (AVN of metatarsal), OA R knee   PAIN:  Are you having pain? Yes: NPRS scale: none currently; at worst 1/10  Pain location: low back Pain description: twinge Aggravating factors: end of 12 hour shift, twisting (knee); bending, valsalva (back)  Relieving factors: tylenol, ice and knee brace  PRECAUTIONS: None  RED FLAGS: None   WEIGHT BEARING RESTRICTIONS: No  FALLS:  Has patient fallen in last 6 months? No  LIVING ENVIRONMENT: Lives with: lives with their spouse Lives in: House/apartment Stairs: Yes: External: 2 steps; none Has following equipment at home: None  OCCUPATION: ICU nurse at [employer]  PATIENT GOALS: Return to normal activities (pt bikes and rows x20 min each)  NEXT MD VISIT: 02/28/24 (Dr. Karie Schwalbe); phone call with osteo MD on 02/10/24  OBJECTIVE:  Note: Objective measures were completed at Evaluation unless otherwise noted.  DIAGNOSTIC FINDINGS: MRI 11/19/23 L knee IMPRESSION: 1. Complete tear at the root of the posterior horn of the medial meniscus, with near absence of meniscal tissue within the 3 mm of the posterior horn closest to the root. Associated mild extrusion of the body of the medial meniscus. 2. Tricompartmental cartilage degenerative changes, severe within the lateral patellofemoral cartilage. 3. Small joint effusion.  PATIENT SURVEYS:  Lower Extremity Functional Score: 47 / 80 = 58.8 % 01/22/24: 59/80.   EDEMA:   "A little edema"  MUSCLE LENGTH: Hamstrings: Left slightly tighter than Right Thomas test: Did not assess  PALPATION: Mostly tender about medial L knee joint line and along edema around medial patella  01/22/24: L spine hypomobility, pain at L2-L4 PAIVM; tautness bilateral lumbar paraspinals   LOWER EXTREMITY ROM:  Active ROM Right eval Left eval 12/31/23   Hip flexion     Hip extension     Hip abduction     Hip adduction     Hip internal rotation     Hip external rotation     Knee flexion 125 125 Lt: 140; Rt: 140   Knee extension 0 -5 Lt: 0; Rt: 0   Ankle dorsiflexion     Ankle plantarflexion     Ankle inversion     Ankle eversion      (Blank rows = not tested)  LOWER EXTREMITY MMT:  MMT Right eval Left eval 01/22/24  Hip flexion 5 4* 5 bilateral; pain in low back with RLE  Hip extension 4 3- 4 bilateral   Hip abduction 5  3+ 4 bilateral   Hip adduction 5 3+ 4+ bilateral   Hip internal rotation     Hip external rotation     Knee flexion 5 5   Knee extension 5 5   Ankle dorsiflexion     Ankle plantarflexion     Ankle inversion     Ankle eversion      (Blank rows = not tested, * = pain) LUMBAR ROM:   Active  AROM  01/22/24  Flexion Full   Extension Full pain center of low back  Right lateral flexion WNL pain Rt low back  Left lateral flexion WNL  Right rotation WNL low back pain  Left rotation WNL low back pain   (Blank rows = not tested)  LOWER EXTREMITY SPECIAL TESTS:  Hip special tests: Luisa Hart (FABER) test: negative and Craig's test: negative  FUNCTIONAL TESTS:  5x STS: 15.92 sec SLS: R 20 sec, L 19 sec  01/22/24: 5 x STS: 8.74 sec   GAIT: Distance walked: Into clinic Assistive device utilized: L knee brace Level of assistance: Complete Independence Comments: Mildly antalgic with L knee slightly kept in flexion   [facility] Adult PT Treatment:                                                DATE: 02/06/2024 Therapeutic Exercise: LTR --> wide leg  LTR --> figure 4 LTR Dead bug Double leg stretch Scissors Side Lying: Hydrants + blue TB Clamshells + blue TB Straight leg hip abd + blue TB Seated red PB roll out stretch  Neuromuscular re-ed: Lunge + straight arm press down with RTB x10 each side Squat + row with GTB x10, blue TB x10  Lunge stance + straight arm isometric press into green bolster on wall --> fwd & midline  Bent over lunge static hold 2x30" --> back heel stabilized at wall   [facility] Adult PT Treatment:                                                DATE: 02/03/2024 Therapeutic Exercise: Supine: SKTC Modified piriformis stretch 5x10" LTR x 1 min Bridges + clamshells blue TB 2x15 Double leg stretch  Scissors S/L hydrants + blue TB 2x10 (B) Neuromuscular re-ed: Dead bug --> proprioceptive awareness and core bracing Quadruped: (postural stability and core bracing) Rock back Cat/cow Single arm raises +YTB Bird dog Therapeutic Activity: Squat + reach across midline with 4#DB Lunge + shoulder extension press back with RTB x8 (B)    [facility] Adult PT Treatment:                                                DATE: 01/30/2024 Therapeutic Exercise: LTR Dynamic HS stretch + ankle pumps x10 (B) Quadruped: Cat/cow Rock back  Lateral cat in neutral spine Child's pose front & diagonals Wide leg LTR Figure 4 LTR Neuromuscular re-ed: Unilateral 90/90 isometric press + TA activation Isometric dead bug Dead bug --> proprioceptive awareness and core bracing Quadruped: Hip extension  Single arm raises Bird dog Therapeutic Activity: Squat deadlift --> dowel cueing hip hinge Squat +  reach across midline with 3#DB Staggered stance squat with fwd weight shifting --> 3#DB press into wall          PATIENT EDUCATION:  Education details: HEP update  Person educated: Patient Education method: Explanation, demo, cues, handout Education comprehension: verbalized understanding, returned demo, cues   HOME EXERCISE  PROGRAM: Access Code: [email or web address] URL: [email or web address] Date: 01/14/2024 Prepared by: Letitia Libra Instructed to complete 2-3 times weekly.   Exercises - Runner's Step Up/Down  - 1 x daily - 7 x weekly - 2 sets - 10 reps - Bridge with Hip Abduction and Resistance  - 1 x daily - 7 x weekly - 2 sets - 10 reps - Forward Step Down Touch with Heel  - 1 x daily - 7 x weekly - 2 sets - 10 reps - The Diver  - 1 x daily - 7 x weekly - 2 sets - 10 reps - Lunge Matrix on Slider  - 1 x daily - 7 x weekly - 2 sets - 10 reps - Standing 3-way Hip with [place]  - 1 x daily - 7 x weekly - 2 sets - 10 reps - Side Stepping with Resistance at Ankles  - 1 x daily - 7 x weekly - 2 sets - 10 reps - Backward Band Walks with Resistance at Thighs and Ankles  - 1 x daily - 7 x weekly - 2 sets - 10 reps - Forward Band Walks with Resistance at Thighs and Ankles  - 1 x daily - 7 x weekly - 2 sets - 10 reps  Access Code: [email or web address] URL: [email or web address] Date: 01/30/2024 Prepared by: Carlynn Herald  Exercises - Supine Lower Trunk Rotation  - 1 x daily - 7 x weekly - 1 sets - 10 reps - Cat Cow  - 1 x daily - 7 x weekly - 1 sets - 10 reps - Supine Figure 4 Piriformis Stretch  - 1 x daily - 7 x weekly - 3 sets - 30  hold - Supine Pelvic Tilt with Straight Leg Raise  - 1 x daily - 7 x weekly - 2 sets - 10 reps - Supine 90/90 Alternating Toe Touch  - 1 x daily - 7 x weekly - 2 sets - 10 reps - Sidelying Thoracic Rotation with Open Book  - 1 x daily - 7 x weekly - 2 sets - 10 reps - Supine Dead Bug with Leg Extension  - 1 x daily - 7 x weekly - 3 sets - 10 reps - Bird Dog  - 1 x daily - 7 x weekly - 3 sets - 10 reps   ASSESSMENT:  CLINICAL IMPRESSION:  Progressed functional activities incorporating abdominal bracing and TA/oblique activation; cueing provided as needed for postural alignment and proper body mechanics. Patient able to complete 30 second static hold in bent forward  lunge position with no exacerbation of pain or cramps/spasms.  RE-EVAL: Re-assessment of the Lt knee and re-evaluation performed for new referral for acute bilateral low back pain without sciatica. She has progressed well in regards to her Lt knee reporting occasional mild levels of pain with full ROM and strength about the Lt knee. She has met majority of goals related to the knee, with exception of hip strength goal. We discussed continuing with HEP 2-3 x weekly for maintenance/independent progression and that we will be focusing our treatment on the acute low back pain which began at the beginning of March from repetitive coughing due to COVID. Upon  assessment she is noted to have good lumbar AROM, though experiences pain with extension, rotation, and Rt lateral flexion. She has tautness about lumbar paraspinals and L-spine hypomobility with pain noted during PAIVM L2-L4. She has hip and core weakness and reports increased pain with functional bending and pushing tasks at work. She will benefit from skilled PT to address the above stated deficits in order to optimize her function and assist in overall pain reduction.   From eval: Patient is a 63 y.o. F who was seen today for physical therapy evaluation and treatment for L knee meniscus tear. PMH significant for Frieburg's on R foot. Assessment demos generally good L knee quad strength; however, decreased lateral and medial knee stability with her pain most notable during twisting/rotary motions. Pt demos decreased hip adductor, abd and glute/extensor strength affecting her tolerance for prolonged standing and walking tasks. Pt will benefit from PT to address these deficits for return to normal function.   OBJECTIVE IMPAIRMENTS: Abnormal gait, decreased activity tolerance, decreased endurance, decreased mobility, difficulty walking, decreased ROM, decreased strength, increased edema, increased fascial restrictions, improper body mechanics, and pain.      GOALS: Goals reviewed with patient? Yes  SHORT TERM GOALS: Target date: 12/26/2023  Pt will be ind with initial HEP Baseline: Goal status: MET  2.  Pt will demo L = R knee ROM Baseline:  Goal status: MET   LONG TERM GOALS: Target date: 03/07/24  Pt will be ind with management and progression of HEP for full return to her normal gym activities (I.e. rowing and biking) Baseline:  Goal status: partially met (independent with knee HEP), issued back HEP   2.  Pt will report >/=75% improvement in her overall knee pain with activity Baseline:  01/22/24: reports 75% improvement  Goal status: MET  3.  Pt will demo improved 5x STS to </=10 sec for increased functional LE strength Baseline:  Goal status: MET  4.  Pt will demo at least 4+/5 hip abductor and extensor for improved knee and lumbopelvic stability.  Baseline:  Goal status: REVISED   5.  Pt will have improved LEFS to >/=56/80 to demo MCID Baseline:  Goal status: MET  6. Patient will be able to sustained bent over lunge positioning for at least 30 seconds without pain to improve tolerance to work specific activity.   Baseline: 6/10 pain with bedside work   Goal status: NEW  7. Patient will demonstrate pain free lumbar AROM to improve ability to complete reaching and bending tasks.   Baseline: see AROM above  Goal Status: NEW    PLAN:  PT FREQUENCY: 2x/week  PT DURATION: 6 weeks  PLANNED INTERVENTIONS: [id number]- PT Re-evaluation, 97110-Therapeutic exercises, 97530- Therapeutic activity, [id number]- Neuromuscular re-education, 97535- Self Care, [id number]- Manual therapy, [id number]- Gait training, [phone]- Aquatic Therapy, [phone]- Electrical stimulation (unattended), [phone]- Electrical stimulation (manual), [id number]- Vasopneumatic device, [id number]- Ultrasound, [id number]- Traction (mechanical), [id number]- Ionotophoresis 4mg /ml Dexamethasone, Patient/Family education, Balance training, Stair training, Taping, Dry Needling, Joint mobilization, Spinal  mobilization, Cryotherapy, and Moist heat  PLAN FOR NEXT SESSION: Progress HEP prn (back); manual to L-spine as needed; core stabilization. Lifting mechanics (hip hinge in standing!); squatting with hand fwd pushing (plugging in electrical cord)  Carlynn Herald, PTA 02/06/24 11:00 AM

## 2024-02-10 ENCOUNTER — Ambulatory Visit

## 2024-02-10 DIAGNOSIS — R252 Cramp and spasm: Secondary | ICD-10-CM | POA: Diagnosis not present

## 2024-02-10 DIAGNOSIS — R2689 Other abnormalities of gait and mobility: Secondary | ICD-10-CM

## 2024-02-10 DIAGNOSIS — M5459 Other low back pain: Secondary | ICD-10-CM

## 2024-02-10 DIAGNOSIS — M25562 Pain in left knee: Secondary | ICD-10-CM

## 2024-02-10 DIAGNOSIS — Z8781 Personal history of (healed) traumatic fracture: Secondary | ICD-10-CM | POA: Diagnosis not present

## 2024-02-10 DIAGNOSIS — M25561 Pain in right knee: Secondary | ICD-10-CM | POA: Diagnosis not present

## 2024-02-10 DIAGNOSIS — M810 Age-related osteoporosis without current pathological fracture: Secondary | ICD-10-CM | POA: Diagnosis not present

## 2024-02-10 DIAGNOSIS — K219 Gastro-esophageal reflux disease without esophagitis: Secondary | ICD-10-CM | POA: Diagnosis not present

## 2024-02-10 DIAGNOSIS — M6281 Muscle weakness (generalized): Secondary | ICD-10-CM | POA: Diagnosis not present

## 2024-02-10 DIAGNOSIS — M9271 Juvenile osteochondrosis of metatarsus, right foot: Secondary | ICD-10-CM | POA: Diagnosis not present

## 2024-02-10 DIAGNOSIS — M25661 Stiffness of right knee, not elsewhere classified: Secondary | ICD-10-CM

## 2024-02-10 DIAGNOSIS — Z8262 Family history of osteoporosis: Secondary | ICD-10-CM | POA: Diagnosis not present

## 2024-02-10 DIAGNOSIS — E663 Overweight: Secondary | ICD-10-CM | POA: Diagnosis not present

## 2024-02-10 DIAGNOSIS — R262 Difficulty in walking, not elsewhere classified: Secondary | ICD-10-CM

## 2024-02-10 DIAGNOSIS — R7303 Prediabetes: Secondary | ICD-10-CM | POA: Diagnosis not present

## 2024-02-10 NOTE — Therapy (Signed)
 OUTPATIENT PHYSICAL THERAPY TREATMENT    Patient Name: Danielle Braun MRN: [id number] DOB:November 23, 1960, 63 y.o., female Today's Date: 02/10/2024  END OF SESSION:  PT End of Session - 02/10/24 1012     Visit Number 17    Number of Visits 23    Date for PT Re-Evaluation 03/07/24    Authorization Type [facility] [employer]    PT Start Time 1013    PT Stop Time 1100    PT Time Calculation (min) 47 min    Activity Tolerance Patient tolerated treatment well    Behavior During Therapy WFL for tasks assessed/performed            Past Medical History:  Diagnosis Date   Acne    Arrhythmia    Bursitis, subacromial    Left   Endometriosis    GERD (gastroesophageal reflux disease)    Joint pain    Palpitations    Retinal tear    Sinusitis    SOB (shortness of breath)    Vitamin D deficiency    Past Surgical History:  Procedure Laterality Date   ABDOMINAL HYSTERECTOMY     ABDOMINAL SURGERY     csection   DILATION AND CURETTAGE OF UTERUS     SHOULDER ARTHROSCOPY WITH SUBACROMIAL DECOMPRESSION Left 10/31/2017   Procedure: SHOULDER ARTHROSCOPY WITH BURSECTOMY AND SUBACROMIAL DECOMPRESSION;  Surgeon: Jones Broom, MD;  Location: [facility] OR;  Service: Orthopedics;  Laterality: Left;  Left shoulder arthroscopy bursectomy and subacromial decompression   TONSILLECTOMY     Patient Active Problem List   Diagnosis Date Noted   Acute low back pain 01/21/2024   COVID 12/26/2023   Chronic cough 12/26/2023   Deviated nasal septum 12/26/2023   Hypertrophy of nasal turbinates 12/26/2023   Traumatic tear of meniscus of left knee 11/15/2023   Prediabetes 04/17/2023   History of colonic polyps 04/17/2023   Other fatigue 04/16/2023   SOB (shortness of breath) on exertion 04/16/2023   Health care maintenance 04/16/2023   Family history of diabetes mellitus 04/16/2023   Depression screening 04/16/2023   Generalized obesity 04/16/2023   BMI 30.0-30.9,adult 04/16/2023   Seasonal allergies  11/07/2022   Family history of osteoporosis 11/06/2022   Postmenopause 11/06/2022   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   Obesity (BMI 30-39.9) 01/26/2022   Patellofemoral pain syndrome of right knee 01/17/2022   Freiberg's disease, right 11/09/2021   Age-related osteoporosis without current pathological fracture 11/01/2021   Chronic sinusitis, unspecified 11/01/2021   Retinal tear of left eye 07/05/2021   History of laparoscopic-assisted vaginal hysterectomy 03/31/2021   History of sacrocolpopexy 03/31/2021   Blepharitis of upper and lower eyelids of both eyes 08/23/2020   Dry eye syndrome of both lacrimal glands 08/23/2020   Nuclear sclerotic cataract of both eyes 08/23/2020   Primary refluxing megaureter 03/02/2020   Sensation of feeling hot 01/18/2020   Chronic left shoulder pain 11/11/2018   Vitamin D insufficiency 11/11/2018   Low bone mass 09/26/2017   Immune to measles 02/20/2016   Allergic rhinitis 02/06/2016   GERD (gastroesophageal reflux disease) 02/06/2016   Hyperlipemia 02/06/2016   Keratoconjunctivitis sicca of both eyes not specified as Sjogren's 02/06/2016   Myopia of both eyes with astigmatism 02/06/2016   Posterior vitreous detachment of left eye 02/06/2016   Premature atrial contraction 02/06/2016   History of right salpingo-oophorectomy 03/14/2010   Acute recurrent maxillary sinusitis 11/06/2008    PCP: Loyal Jacobson, MD  REFERRING PROVIDER: Monica Becton, MD  REFERRING DIAG:  [phone] (  ICD-10-CM) - Traumatic tear of meniscus of left knee, subsequent encounter M54.50 (ICD-10-CM) - Acute bilateral low back pain without sciatica      THERAPY DIAG:  Muscle weakness (generalized)  Other abnormalities of gait and mobility  Other low back pain  Acute pain of left knee  Acute pain of right knee  Stiffness of right knee, not elsewhere classified  Cramp and spasm  Difficulty in walking, not elsewhere classified  Rationale for  Evaluation and Treatment: Rehabilitation  ONSET DATE: ~3 weeks ago  SUBJECTIVE:   SUBJECTIVE STATEMENT: Patient reports she woke up with pain radiating from R side of low back going down back of thigh. Patient states she had phone call with osteo MD who is recommending she start taking daily injections with osteo medication starting in June.   From eval: Pt states she's had issues with both legs in the last 2 years. Pt was having L hip and leg pain in December. Pt was walking and her knee gave way ~3 weeks ago. Pt states she has been able to do her 12 hour shifts and be careful. Pt reports pain was not so bad she needed an injection.   PERTINENT HISTORY: Frieburgs disease R foot (AVN of metatarsal), OA R knee   PAIN:  Are you having pain? Yes: NPRS scale: none currently; at worst 1/10  Pain location: low back Pain description: twinge Aggravating factors: end of 12 hour shift, twisting (knee); bending, valsalva (back)  Relieving factors: tylenol, ice and knee brace  PRECAUTIONS: None  RED FLAGS: None   WEIGHT BEARING RESTRICTIONS: No  FALLS:  Has patient fallen in last 6 months? No  LIVING ENVIRONMENT: Lives with: lives with their spouse Lives in: House/apartment Stairs: Yes: External: 2 steps; none Has following equipment at home: None  OCCUPATION: ICU nurse at [employer]  PATIENT GOALS: Return to normal activities (pt bikes and rows x20 min each)  NEXT MD VISIT: 02/28/24 (Dr. Elva Hamburger)  OBJECTIVE:  Note: Objective measures were completed at Evaluation unless otherwise noted.  DIAGNOSTIC FINDINGS: MRI 11/19/23 L knee IMPRESSION: 1. Complete tear at the root of the posterior horn of the medial meniscus, with near absence of meniscal tissue within the 3 mm of the posterior horn closest to the root. Associated mild extrusion of the body of the medial meniscus. 2. Tricompartmental cartilage degenerative changes, severe within the lateral patellofemoral cartilage. 3. Small  joint effusion.  PATIENT SURVEYS:  Lower Extremity Functional Score: 47 / 80 = 58.8 % 01/22/24: 59/80.   EDEMA:  "A little edema"  MUSCLE LENGTH: Hamstrings: Left slightly tighter than Right Thomas test: Did not assess  PALPATION: Mostly tender about medial L knee joint line and along edema around medial patella  01/22/24: L spine hypomobility, pain at L2-L4 PAIVM; tautness bilateral lumbar paraspinals   LOWER EXTREMITY ROM:  Active ROM Right eval Left eval 12/31/23   Hip flexion     Hip extension     Hip abduction     Hip adduction     Hip internal rotation     Hip external rotation     Knee flexion 125 125 Lt: 140; Rt: 140   Knee extension 0 -5 Lt: 0; Rt: 0   Ankle dorsiflexion     Ankle plantarflexion     Ankle inversion     Ankle eversion      (Blank rows = not tested)  LOWER EXTREMITY MMT:  MMT Right eval Left eval 01/22/24  Hip flexion 5 4* 5  bilateral; pain in low back with RLE  Hip extension 4 3- 4 bilateral   Hip abduction 5 3+ 4 bilateral   Hip adduction 5 3+ 4+ bilateral   Hip internal rotation     Hip external rotation     Knee flexion 5 5   Knee extension 5 5   Ankle dorsiflexion     Ankle plantarflexion     Ankle inversion     Ankle eversion      (Blank rows = not tested, * = pain) LUMBAR ROM:   Active  AROM  01/22/24  Flexion Full   Extension Full pain center of low back  Right lateral flexion WNL pain Rt low back  Left lateral flexion WNL  Right rotation WNL low back pain  Left rotation WNL low back pain   (Blank rows = not tested)  LOWER EXTREMITY SPECIAL TESTS:  Hip special tests: Portia Brittle (FABER) test: negative and Craig's test: negative  FUNCTIONAL TESTS:  5x STS: 15.92 sec SLS: R 20 sec, L 19 sec  01/22/24: 5 x STS: 8.74 sec   GAIT: Distance walked: Into clinic Assistive device utilized: L knee brace Level of assistance: Complete Independence Comments: Mildly antalgic with L knee slightly kept in flexion   [facility] Adult  PT Treatment:                                                DATE: 02/10/2024 Therapeutic Exercise: Staggered stance trunk rotation + double GTB x10 (B) Staggered stance pull down + GTB 2x10 Neuromuscular re-ed: Standing with back against wall --> Postural awareness & core bracing + 2#DB: Low bicep curls High bicep curl --> front & scaption 90 degrees Zip up  Cactus arm reach  Small circles to low to 90 deg Bent over hip hinge: Boxing Reverse fly Standing: Chest expansion Side stretch Lunge stance + straight arm isometric press into green bolster on wall --> midline press    [facility] Adult PT Treatment:                                                DATE: 02/06/2024 Therapeutic Exercise: LTR --> wide leg LTR --> figure 4 LTR Dead bug Double leg stretch Scissors Side Lying: Hydrants + blue TB Clamshells + blue TB Straight leg hip abd + blue TB Seated red PB roll out stretch  Neuromuscular re-ed: Lunge + straight arm press down with RTB x10 each side Squat + row with GTB x10, blue TB x10  Lunge stance + straight arm isometric press into green bolster on wall --> fwd & midline  Bent over lunge static hold 2x30" --> back heel stabilized at wall   [facility] Adult PT Treatment:                                                DATE: 02/03/2024 Therapeutic Exercise: Supine: SKTC Modified piriformis stretch 5x10" LTR x 1 min Bridges + clamshells blue TB 2x15 Double leg stretch  Scissors S/L hydrants + blue TB 2x10 (B) Neuromuscular re-ed: Dead bug --> proprioceptive awareness and core bracing  Quadruped: (postural stability and core bracing) Rock back Cat/cow Single arm raises +YTB Bird dog Therapeutic Activity: Squat + reach across midline with 4#DB Lunge + shoulder extension press back with RTB x8 (B)    [facility] Adult PT Treatment:                                                DATE: 01/30/2024 Therapeutic Exercise: LTR Dynamic HS stretch + ankle pumps x10  (B) Quadruped: Cat/cow Rock back  Lateral cat in neutral spine Child's pose front & diagonals Wide leg LTR Figure 4 LTR Neuromuscular re-ed: Unilateral 90/90 isometric press + TA activation Isometric dead bug Dead bug --> proprioceptive awareness and core bracing Quadruped: Hip extension  Single arm raises Bird dog Therapeutic Activity: Squat deadlift --> dowel cueing hip hinge Squat + reach across midline with 3#DB Staggered stance squat with fwd weight shifting --> 3#DB press into wall          PATIENT EDUCATION:  Education details: HEP update  Person educated: Patient Education method: Explanation, demo, cues, handout Education comprehension: verbalized understanding, returned demo, cues   HOME EXERCISE PROGRAM: Access Code: [email or web address] URL: [email or web address] Date: 01/14/2024 Prepared by: Letitia Libra Instructed to complete 2-3 times weekly.   Exercises - Runner's Step Up/Down  - 1 x daily - 7 x weekly - 2 sets - 10 reps - Bridge with Hip Abduction and Resistance  - 1 x daily - 7 x weekly - 2 sets - 10 reps - Forward Step Down Touch with Heel  - 1 x daily - 7 x weekly - 2 sets - 10 reps - The Diver  - 1 x daily - 7 x weekly - 2 sets - 10 reps - Lunge Matrix on Slider  - 1 x daily - 7 x weekly - 2 sets - 10 reps - Standing 3-way Hip with Walker  - 1 x daily - 7 x weekly - 2 sets - 10 reps - Side Stepping with Resistance at Ankles  - 1 x daily - 7 x weekly - 2 sets - 10 reps - Backward Band Walks with Resistance at Thighs and Ankles  - 1 x daily - 7 x weekly - 2 sets - 10 reps - Forward Band Walks with Resistance at Thighs and Ankles  - 1 x daily - 7 x weekly - 2 sets - 10 reps  Access Code: [email or web address] URL: [email or web address] Date: 01/30/2024 Prepared by: Carlynn Herald  Exercises - Supine Lower Trunk Rotation  - 1 x daily - 7 x weekly - 1 sets - 10 reps - Cat Cow  - 1 x daily - 7 x weekly - 1 sets - 10 reps - Supine Figure 4  Piriformis Stretch  - 1 x daily - 7 x weekly - 3 sets - 30  hold - Supine Pelvic Tilt with Straight Leg Raise  - 1 x daily - 7 x weekly - 2 sets - 10 reps - Supine 90/90 Alternating Toe Touch  - 1 x daily - 7 x weekly - 2 sets - 10 reps - Sidelying Thoracic Rotation with Open Book  - 1 x daily - 7 x weekly - 2 sets - 10 reps - Supine Dead Bug with Leg Extension  - 1 x daily - 7 x weekly - 3 sets - 10 reps - Bird Dog  -  1 x daily - 7 x weekly - 3 sets - 10 reps   ASSESSMENT:  CLINICAL IMPRESSION:  Postural strengthening incorporated with light weights to challenge core bracing mechanics and proprioceptive awareness. Trunk rotation mechanics progressed for work-related tasks.   RE-EVAL: Re-assessment of the Lt knee and re-evaluation performed for new referral for acute bilateral low back pain without sciatica. She has progressed well in regards to her Lt knee reporting occasional mild levels of pain with full ROM and strength about the Lt knee. She has met majority of goals related to the knee, with exception of hip strength goal. We discussed continuing with HEP 2-3 x weekly for maintenance/independent progression and that we will be focusing our treatment on the acute low back pain which began at the beginning of March from repetitive coughing due to COVID. Upon assessment she is noted to have good lumbar AROM, though experiences pain with extension, rotation, and Rt lateral flexion. She has tautness about lumbar paraspinals and L-spine hypomobility with pain noted during PAIVM L2-L4. She has hip and core weakness and reports increased pain with functional bending and pushing tasks at work. She will benefit from skilled PT to address the above stated deficits in order to optimize her function and assist in overall pain reduction.   From eval: Patient is a 63 y.o. F who was seen today for physical therapy evaluation and treatment for L knee meniscus tear. PMH significant for Frieburg's on R foot.  Assessment demos generally good L knee quad strength; however, decreased lateral and medial knee stability with her pain most notable during twisting/rotary motions. Pt demos decreased hip adductor, abd and glute/extensor strength affecting her tolerance for prolonged standing and walking tasks. Pt will benefit from PT to address these deficits for return to normal function.   OBJECTIVE IMPAIRMENTS: Abnormal gait, decreased activity tolerance, decreased endurance, decreased mobility, difficulty walking, decreased ROM, decreased strength, increased edema, increased fascial restrictions, improper body mechanics, and pain.     GOALS: Goals reviewed with patient? Yes  SHORT TERM GOALS: Target date: 12/26/2023  Pt will be ind with initial HEP Baseline: Goal status: MET  2.  Pt will demo L = R knee ROM Baseline:  Goal status: MET   LONG TERM GOALS: Target date: 03/07/24  Pt will be ind with management and progression of HEP for full return to her normal gym activities (I.e. rowing and biking) Baseline:  Goal status: partially met (independent with knee HEP), issued back HEP   2.  Pt will report >/=75% improvement in her overall knee pain with activity Baseline:  01/22/24: reports 75% improvement  Goal status: MET  3.  Pt will demo improved 5x STS to </=10 sec for increased functional LE strength Baseline:  Goal status: MET  4.  Pt will demo at least 4+/5 hip abductor and extensor for improved knee and lumbopelvic stability.  Baseline:  Goal status: REVISED   5.  Pt will have improved LEFS to >/=56/80 to demo MCID Baseline:  Goal status: MET  6. Patient will be able to sustained bent over lunge positioning for at least 30 seconds without pain to improve tolerance to work specific activity.   Baseline: 6/10 pain with bedside work   Goal status: NEW  7. Patient will demonstrate pain free lumbar AROM to improve ability to complete reaching and bending tasks.   Baseline: see AROM  above  Goal Status: NEW    PLAN:  PT FREQUENCY: 2x/week  PT DURATION: 6 weeks  PLANNED  INTERVENTIONS: 97164- PT Re-evaluation, 97110-Therapeutic exercises, 97530- Therapeutic activity, [id number]- Neuromuscular re-education, 97535- Self Care, [id number]- Manual therapy, [id number]- Gait training, [phone]- Aquatic Therapy, [phone]- Electrical stimulation (unattended), [phone]- Electrical stimulation (manual), [id number]- Vasopneumatic device, [id number]- Ultrasound, [id number]- Traction (mechanical), [id number]- Ionotophoresis 4mg /ml Dexamethasone, Patient/Family education, Balance training, Stair training, Taping, Dry Needling, Joint mobilization, Spinal mobilization, Cryotherapy, and Moist heat  PLAN FOR NEXT SESSION: Progress HEP prn (back); manual to L-spine as needed; core stabilization. Lifting mechanics (hip hinge in standing!); squatting with hand fwd pushing (plugging in electrical cord)  Sims Duck, PTA 02/10/24 11:02 AM

## 2024-02-13 ENCOUNTER — Ambulatory Visit

## 2024-02-13 DIAGNOSIS — M25562 Pain in left knee: Secondary | ICD-10-CM | POA: Diagnosis not present

## 2024-02-13 DIAGNOSIS — M5459 Other low back pain: Secondary | ICD-10-CM | POA: Diagnosis not present

## 2024-02-13 DIAGNOSIS — R262 Difficulty in walking, not elsewhere classified: Secondary | ICD-10-CM

## 2024-02-13 DIAGNOSIS — M25661 Stiffness of right knee, not elsewhere classified: Secondary | ICD-10-CM | POA: Diagnosis not present

## 2024-02-13 DIAGNOSIS — M6281 Muscle weakness (generalized): Secondary | ICD-10-CM

## 2024-02-13 DIAGNOSIS — R252 Cramp and spasm: Secondary | ICD-10-CM

## 2024-02-13 DIAGNOSIS — M25561 Pain in right knee: Secondary | ICD-10-CM | POA: Diagnosis not present

## 2024-02-13 DIAGNOSIS — R2689 Other abnormalities of gait and mobility: Secondary | ICD-10-CM | POA: Diagnosis not present

## 2024-02-13 NOTE — Therapy (Signed)
 OUTPATIENT PHYSICAL THERAPY TREATMENT    Patient Name: Danielle Braun MRN: [id number] DOB:[birth year], 63 y.o., female Today's Date: 02/13/2024  END OF SESSION:  PT End of Session - 02/13/24 0844     Visit Number 18    Number of Visits 23    Date for PT Re-Evaluation 03/07/24    Authorization Type [employer]ret[employer]    PT Start Time [phone]    PT Stop Time 0929    PT Time Calculation (min) 45 min    Activity Tolerance Patient tolerated treatment well    Behavior During Therapy WFL for tasks assessed/performed            Past Medical History:  Diagnosis Date   Acne    Arrhythmia    Bursitis, subacromial    Left   Endometriosis    GERD (gastroesophageal reflux disease)    Joint pain    Palpitations    Retinal tear    Sinusitis    SOB (shortness of breath)    Vitamin D deficiency    Past Surgical History:  Procedure Laterality Date   ABDOMINAL HYSTERECTOMY     ABDOMINAL SURGERY     csection   DILATION AND CURETTAGE OF UTERUS     SHOULDER ARTHROSCOPY WITH SUBACROMIAL DECOMPRESSION Left 10/31/2017   Procedure: SHOULDER ARTHROSCOPY WITH BURSECTOMY AND SUBACROMIAL DECOMPRESSION;  Surgeon: Jones Broom, MD;  Location: [facility] OR;  Service: Orthopedics;  Laterality: Left;  Left shoulder arthroscopy bursectomy and subacromial decompression   TONSILLECTOMY     Patient Active Problem List   Diagnosis Date Noted   Acute low back pain 01/21/2024   COVID 12/26/2023   Chronic cough 12/26/2023   Deviated nasal septum 12/26/2023   Hypertrophy of nasal turbinates 12/26/2023   Traumatic tear of meniscus of left knee 11/15/2023   Prediabetes 04/17/2023   History of colonic polyps 04/17/2023   Other fatigue 04/16/2023   SOB (shortness of breath) on exertion 04/16/2023   Health care maintenance 04/16/2023   Family history of diabetes mellitus 04/16/2023   Depression screening 04/16/2023   Generalized obesity 04/16/2023   BMI 30.0-30.9,adult 04/16/2023   Seasonal allergies  11/07/2022   Family history of osteoporosis 11/06/2022   Postmenopause 11/06/2022   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   Obesity (BMI 30-39.9) 01/26/2022   Patellofemoral pain syndrome of right knee 01/17/2022   Freiberg's disease, right 11/09/2021   Age-related osteoporosis without current pathological fracture 11/01/2021   Chronic sinusitis, unspecified 11/01/2021   Retinal tear of left eye 07/05/2021   History of laparoscopic-assisted vaginal hysterectomy 03/31/2021   History of sacrocolpopexy 03/31/2021   Blepharitis of upper and lower eyelids of both eyes 08/23/2020   Dry eye syndrome of both lacrimal glands 08/23/2020   Nuclear sclerotic cataract of both eyes 08/23/2020   Primary refluxing megaureter 03/02/2020   Sensation of feeling hot 01/18/2020   Chronic left shoulder pain 11/11/2018   Vitamin D insufficiency 11/11/2018   Low bone mass 09/26/2017   Immune to measles 02/20/2016   Allergic rhinitis 02/06/2016   GERD (gastroesophageal reflux disease) 02/06/2016   Hyperlipemia 02/06/2016   Keratoconjunctivitis sicca of both eyes not specified as Sjogren's 02/06/2016   Myopia of both eyes with astigmatism 02/06/2016   Posterior vitreous detachment of left eye 02/06/2016   Premature atrial contraction 02/06/2016   History of right salpingo-oophorectomy 03/14/2010   Acute recurrent maxillary sinusitis 11/06/2008    PCP: Loyal Jacobson, MD  REFERRING PROVIDER: Monica Becton, MD  REFERRING DIAG:  [phone] (  ICD-10-CM) - Traumatic tear of meniscus of left knee, subsequent encounter M54.50 (ICD-10-CM) - Acute bilateral low back pain without sciatica      THERAPY DIAG:  Muscle weakness (generalized)  Other abnormalities of gait and mobility  Other low back pain  Acute pain of left knee  Acute pain of right knee  Stiffness of right knee, not elsewhere classified  Cramp and spasm  Difficulty in walking, not elsewhere classified  Rationale for  Evaluation and Treatment: Rehabilitation  ONSET DATE: ~3 weeks ago  SUBJECTIVE:   SUBJECTIVE STATEMENT: Patient reports she hasn't had anymore pain down R leg since last visit. Patient states she is noticing how the majority of her work tasks have her in a forward reaching position.   From eval: Pt states she's had issues with both legs in the last 2 years. Pt was having L hip and leg pain in December. Pt was walking and her knee gave way ~3 weeks ago. Pt states she has been able to do her 12 hour shifts and be careful. Pt reports pain was not so bad she needed an injection.   PERTINENT HISTORY: Frieburgs disease R foot (AVN of metatarsal), OA R knee   PAIN:  Are you having pain? Yes: NPRS scale: none currently; at worst 1/10  Pain location: low back Pain description: twinge Aggravating factors: end of 12 hour shift, twisting (knee); bending, valsalva (back)  Relieving factors: tylenol, ice and knee brace  PRECAUTIONS: None  RED FLAGS: None   WEIGHT BEARING RESTRICTIONS: No  FALLS:  Has patient fallen in last 6 months? No  LIVING ENVIRONMENT: Lives with: lives with their spouse Lives in: House/apartment Stairs: Yes: External: 2 steps; none Has following equipment at home: None  OCCUPATION: ICU nurse at [employer]  PATIENT GOALS: Return to normal activities (pt bikes and rows x20 min each)  NEXT MD VISIT: 02/28/24 (Dr. Elva Hamburger)  OBJECTIVE:  Note: Objective measures were completed at Evaluation unless otherwise noted.  DIAGNOSTIC FINDINGS: MRI 11/19/23 L knee IMPRESSION: 1. Complete tear at the root of the posterior horn of the medial meniscus, with near absence of meniscal tissue within the 3 mm of the posterior horn closest to the root. Associated mild extrusion of the body of the medial meniscus. 2. Tricompartmental cartilage degenerative changes, severe within the lateral patellofemoral cartilage. 3. Small joint effusion.  PATIENT SURVEYS:  Lower Extremity Functional  Score: 47 / 80 = 58.8 % 01/22/24: 59/80.   EDEMA:  "A little edema"  MUSCLE LENGTH: Hamstrings: Left slightly tighter than Right Thomas test: Did not assess  PALPATION: Mostly tender about medial L knee joint line and along edema around medial patella  01/22/24: L spine hypomobility, pain at L2-L4 PAIVM; tautness bilateral lumbar paraspinals   LOWER EXTREMITY ROM:  Active ROM Right eval Left eval 12/31/23   Hip flexion     Hip extension     Hip abduction     Hip adduction     Hip internal rotation     Hip external rotation     Knee flexion 125 125 Lt: 140; Rt: 140   Knee extension 0 -5 Lt: 0; Rt: 0   Ankle dorsiflexion     Ankle plantarflexion     Ankle inversion     Ankle eversion      (Blank rows = not tested)  LOWER EXTREMITY MMT:  MMT Right eval Left eval 01/22/24  Hip flexion 5 4* 5 bilateral; pain in low back with RLE  Hip extension  4 3- 4 bilateral   Hip abduction 5 3+ 4 bilateral   Hip adduction 5 3+ 4+ bilateral   Hip internal rotation     Hip external rotation     Knee flexion 5 5   Knee extension 5 5   Ankle dorsiflexion     Ankle plantarflexion     Ankle inversion     Ankle eversion      (Blank rows = not tested, * = pain) LUMBAR ROM:   Active  AROM  01/22/24 AROM  02/13/24  Flexion Full  Full no pain  Extension Full pain center of low back Full no pain  Right lateral flexion WNL pain Rt low back WNL No pain  Left lateral flexion WNL WNL no pain  Right rotation WNL low back pain WNL no pain  Left rotation WNL low back pain WNL mild pain L low back   (Blank rows = not tested)  LOWER EXTREMITY SPECIAL TESTS:  Hip special tests: Portia Brittle (FABER) test: negative and Craig's test: negative  FUNCTIONAL TESTS:  5x STS: 15.92 sec SLS: R 20 sec, L 19 sec  01/22/24: 5 x STS: 8.74 sec   GAIT: Distance walked: Into clinic Assistive device utilized: L knee brace Level of assistance: Complete Independence Comments: Mildly antalgic with L knee  slightly kept in flexion   [facility] Adult PT Treatment:                                                DATE: 02/13/2024 Therapeutic Exercise: Lumbar AROM (see above) LTR x 1 min Wide leg LTR x 1 min Supine bridges x 10 --> modified single leg bridge (opp foot propped on block) x10 each leg Neuromuscular re-ed: 1/2 kneeling + diagonal reaches with 4#MB x10 each side Hip hinge in lunge + 5#KB rows with UE support Bent over lunge hold 2x30"  Therapeutic Activity: Body mechanics with bed work tasks --> reaching over bed to pull towards her --> reaching over bed + pulling on orange superband with mini squat    [facility] Adult PT Treatment:                                                DATE: 02/10/2024 Therapeutic Exercise: Staggered stance trunk rotation + double GTB x10 (B) Staggered stance pull down + GTB 2x10 Neuromuscular re-ed: Standing with back against wall --> Postural awareness & core bracing + 2#DB: Low bicep curls High bicep curl --> front & scaption 90 degrees Zip up  Cactus arm reach  Small circles to low to 90 deg Bent over hip hinge: Boxing Reverse fly Standing: Chest expansion Side stretch Lunge stance + straight arm isometric press into green bolster on wall --> midline press    [facility] Adult PT Treatment:                                                DATE: 02/06/2024 Therapeutic Exercise: LTR --> wide leg LTR --> figure 4 LTR Dead bug Double leg stretch Scissors Side Lying: Hydrants + blue TB Clamshells + blue TB Straight leg  hip abd + blue TB Seated red PB roll out stretch  Neuromuscular re-ed: Lunge + straight arm press down with RTB x10 each side Squat + row with GTB x10, blue TB x10  Lunge stance + straight arm isometric press into green bolster on wall --> fwd & midline  Bent over lunge static hold 2x30" --> back heel stabilized at wall          PATIENT EDUCATION:  Education details: HEP update  Person educated: Patient Education method: Explanation,  demo, cues, handout Education comprehension: verbalized understanding, returned demo, cues   HOME EXERCISE PROGRAM: Access Code: [email or web address] URL: [email or web address] Date: 01/14/2024 Prepared by: Forrestine Ike Instructed to complete 2-3 times weekly.   Exercises - Runner's Step Up/Down  - 1 x daily - 7 x weekly - 2 sets - 10 reps - Bridge with Hip Abduction and Resistance  - 1 x daily - 7 x weekly - 2 sets - 10 reps - Forward Step Down Touch with Heel  - 1 x daily - 7 x weekly - 2 sets - 10 reps - The Diver  - 1 x daily - 7 x weekly - 2 sets - 10 reps - Lunge Matrix on Slider  - 1 x daily - 7 x weekly - 2 sets - 10 reps - Standing 3-way Hip with Walker  - 1 x daily - 7 x weekly - 2 sets - 10 reps - Side Stepping with Resistance at Ankles  - 1 x daily - 7 x weekly - 2 sets - 10 reps - Backward Band Walks with Resistance at Thighs and Ankles  - 1 x daily - 7 x weekly - 2 sets - 10 reps - Forward Band Walks with Resistance at Thighs and Ankles  - 1 x daily - 7 x weekly - 2 sets - 10 reps  Access Code: [email or web address] URL: [email or web address] Date: 01/30/2024 Prepared by: Sims Duck  Exercises - Supine Lower Trunk Rotation  - 1 x daily - 7 x weekly - 1 sets - 10 reps - Cat Cow  - 1 x daily - 7 x weekly - 1 sets - 10 reps - Supine Figure 4 Piriformis Stretch  - 1 x daily - 7 x weekly - 3 sets - 30  hold - Supine Pelvic Tilt with Straight Leg Raise  - 1 x daily - 7 x weekly - 2 sets - 10 reps - Supine 90/90 Alternating Toe Touch  - 1 x daily - 7 x weekly - 2 sets - 10 reps - Sidelying Thoracic Rotation with Open Book  - 1 x daily - 7 x weekly - 2 sets - 10 reps - Supine Dead Bug with Leg Extension  - 1 x daily - 7 x weekly - 3 sets - 10 reps - Bird Dog  - 1 x daily - 7 x weekly - 3 sets - 10 reps   ASSESSMENT:  CLINICAL IMPRESSION:  Lumbar AROM has improved with patient reporting no pain in all directions except mild pain with trunk rotation to left  side. Patient able to complete 30 seconds in bent over lunge position with no exacerbation of pain. Remainder if session focused on review of proper body mechanics with bed-related tasks at work.  RE-EVAL: Re-assessment of the Lt knee and re-evaluation performed for new referral for acute bilateral low back pain without sciatica. She has progressed well in regards to her Lt knee reporting occasional mild levels of pain with full ROM and strength  about the Lt knee. She has met majority of goals related to the knee, with exception of hip strength goal. We discussed continuing with HEP 2-3 x weekly for maintenance/independent progression and that we will be focusing our treatment on the acute low back pain which began at the beginning of March from repetitive coughing due to COVID. Upon assessment she is noted to have good lumbar AROM, though experiences pain with extension, rotation, and Rt lateral flexion. She has tautness about lumbar paraspinals and L-spine hypomobility with pain noted during PAIVM L2-L4. She has hip and core weakness and reports increased pain with functional bending and pushing tasks at work. She will benefit from skilled PT to address the above stated deficits in order to optimize her function and assist in overall pain reduction.   From eval: Patient is a 63 y.o. F who was seen today for physical therapy evaluation and treatment for L knee meniscus tear. PMH significant for Frieburg's on R foot. Assessment demos generally good L knee quad strength; however, decreased lateral and medial knee stability with her pain most notable during twisting/rotary motions. Pt demos decreased hip adductor, abd and glute/extensor strength affecting her tolerance for prolonged standing and walking tasks. Pt will benefit from PT to address these deficits for return to normal function.   OBJECTIVE IMPAIRMENTS: Abnormal gait, decreased activity tolerance, decreased endurance, decreased mobility, difficulty  walking, decreased ROM, decreased strength, increased edema, increased fascial restrictions, improper body mechanics, and pain.     GOALS: Goals reviewed with patient? Yes  SHORT TERM GOALS: Target date: 12/26/2023  Pt will be ind with initial HEP Baseline: Goal status: MET  2.  Pt will demo L = R knee ROM Baseline:  Goal status: MET   LONG TERM GOALS: Target date: 03/07/24  Pt will be ind with management and progression of HEP for full return to her normal gym activities (I.e. rowing and biking) Baseline:  02/13/24: 20 min x 4 days/week Goal status: MET   2.  Pt will report >/=75% improvement in her overall knee pain with activity Baseline:  01/22/24: reports 75% improvement  Goal status: MET  3.  Pt will demo improved 5x STS to </=10 sec for increased functional LE strength Baseline:  Goal status: MET  4.  Pt will demo at least 4+/5 hip abductor and extensor for improved knee and lumbopelvic stability.  Baseline:  Goal status: REVISED   5.  Pt will have improved LEFS to >/=56/80 to demo [facility] Baseline:  Goal status: MET  6. Patient will be able to sustained bent over lunge positioning for at least 30 seconds without pain to improve tolerance to work specific activity.   Baseline: 6/10 pain with bedside work   02/13/24: no pain  Goal status: MET  7. Patient will demonstrate pain free lumbar AROM to improve ability to complete reaching and bending tasks.   Baseline: see AROM above 02/13/24: mild pain with L rotation  Goal Status: IN PROGRESS   PLAN:  PT FREQUENCY: 2x/week  PT DURATION: 6 weeks  PLANNED INTERVENTIONS: [id number]- PT Re-evaluation, 97110-Therapeutic exercises, 97530- Therapeutic activity, 97112- Neuromuscular re-education, 97535- Self Care, 95621- Manual therapy, [id number]- Gait training, [phone]- Aquatic Therapy, [phone]- Electrical stimulation (unattended), [id number]- Electrical stimulation (manual), [id number]- Vasopneumatic device, [id number]- Ultrasound, [id number]- Traction  (mechanical), [id number]- Ionotophoresis 4mg /ml Dexamethasone, Patient/Family education, Balance training, Stair training, Taping, Dry Needling, Joint mobilization, Spinal mobilization, Cryotherapy, and Moist heat  PLAN FOR NEXT SESSION: Progress HEP prn (back); core stabilization. Lifting mechanics (hip hinge  in standing!); squatting/kneeling/lunge activities  Sims Duck, PTA 02/13/24 9:31 AM

## 2024-02-17 ENCOUNTER — Other Ambulatory Visit: Payer: Self-pay | Admitting: Medical Genetics

## 2024-02-18 ENCOUNTER — Ambulatory Visit

## 2024-02-18 DIAGNOSIS — M25661 Stiffness of right knee, not elsewhere classified: Secondary | ICD-10-CM | POA: Diagnosis not present

## 2024-02-18 DIAGNOSIS — M5459 Other low back pain: Secondary | ICD-10-CM | POA: Diagnosis not present

## 2024-02-18 DIAGNOSIS — M6281 Muscle weakness (generalized): Secondary | ICD-10-CM | POA: Diagnosis not present

## 2024-02-18 DIAGNOSIS — R262 Difficulty in walking, not elsewhere classified: Secondary | ICD-10-CM | POA: Diagnosis not present

## 2024-02-18 DIAGNOSIS — R252 Cramp and spasm: Secondary | ICD-10-CM | POA: Diagnosis not present

## 2024-02-18 DIAGNOSIS — R2689 Other abnormalities of gait and mobility: Secondary | ICD-10-CM

## 2024-02-18 DIAGNOSIS — M25561 Pain in right knee: Secondary | ICD-10-CM | POA: Diagnosis not present

## 2024-02-18 DIAGNOSIS — M25562 Pain in left knee: Secondary | ICD-10-CM | POA: Diagnosis not present

## 2024-02-18 NOTE — Therapy (Signed)
 OUTPATIENT PHYSICAL THERAPY TREATMENT    Patient Name: Danielle Braun MRN: [id number] DOB:08/08/1961, 63 y.o., female Today's Date: 02/18/2024  END OF SESSION:  PT End of Session - 02/18/24 1535     Visit Number 19    Number of Visits 23    Date for PT Re-Evaluation 03/07/24    Authorization Type [employer]    PT Start Time 1535    PT Stop Time 1616    PT Time Calculation (min) 41 min    Activity Tolerance Patient tolerated treatment well    Behavior During Therapy WFL for tasks assessed/performed             Past Medical History:  Diagnosis Date   Acne    Arrhythmia    Bursitis, subacromial    Left   Endometriosis    GERD (gastroesophageal reflux disease)    Joint pain    Palpitations    Retinal tear    Sinusitis    SOB (shortness of breath)    Vitamin D  deficiency    Past Surgical History:  Procedure Laterality Date   ABDOMINAL HYSTERECTOMY     ABDOMINAL SURGERY     csection   DILATION AND CURETTAGE OF UTERUS     SHOULDER ARTHROSCOPY WITH SUBACROMIAL DECOMPRESSION Left 10/31/2017   Procedure: SHOULDER ARTHROSCOPY WITH BURSECTOMY AND SUBACROMIAL DECOMPRESSION;  Surgeon: Sammye Cristal, MD;  Location: [facility] OR;  Service: Orthopedics;  Laterality: Left;  Left shoulder arthroscopy bursectomy and subacromial decompression   TONSILLECTOMY     Patient Active Problem List   Diagnosis Date Noted   Acute low back pain 01/21/2024   COVID 12/26/2023   Chronic cough 12/26/2023   Deviated nasal septum 12/26/2023   Hypertrophy of nasal turbinates 12/26/2023   Traumatic tear of meniscus of left knee 11/15/2023   Prediabetes 04/17/2023   History of colonic polyps 04/17/2023   Other fatigue 04/16/2023   SOB (shortness of breath) on exertion 04/16/2023   Health care maintenance 04/16/2023   Family history of diabetes mellitus 04/16/2023   Depression screening 04/16/2023   Generalized obesity 04/16/2023   BMI 30.0-30.9,adult 04/16/2023   Seasonal allergies  11/07/2022   Family history of osteoporosis 11/06/2022   Postmenopause 11/06/2022   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   Obesity (BMI 30-39.9) 01/26/2022   Patellofemoral pain syndrome of right knee 01/17/2022   Freiberg's disease, right 11/09/2021   Age-related osteoporosis without current pathological fracture 11/01/2021   Chronic sinusitis, unspecified 11/01/2021   Retinal tear of left eye 07/05/2021   History of laparoscopic-assisted vaginal hysterectomy 03/31/2021   History of sacrocolpopexy 03/31/2021   Blepharitis of upper and lower eyelids of both eyes 08/23/2020   Dry eye syndrome of both lacrimal glands 08/23/2020   Nuclear sclerotic cataract of both eyes 08/23/2020   Primary refluxing megaureter 03/02/2020   Sensation of feeling hot 01/18/2020   Chronic left shoulder pain 11/11/2018   Vitamin D  insufficiency 11/11/2018   Low bone mass 09/26/2017   Immune to measles 02/20/2016   Allergic rhinitis 02/06/2016   GERD (gastroesophageal reflux disease) 02/06/2016   Hyperlipemia 02/06/2016   Keratoconjunctivitis sicca of both eyes not specified as Sjogren's 02/06/2016   Myopia of both eyes with astigmatism 02/06/2016   Posterior vitreous detachment of left eye 02/06/2016   Premature atrial contraction 02/06/2016   History of right salpingo-oophorectomy 03/14/2010   Acute recurrent maxillary sinusitis 11/06/2008    PCP: Jayne Mews, MD  REFERRING PROVIDER: Gean Keels, MD  REFERRING DIAG:  G95.621H (ICD-10-CM) - Traumatic tear of meniscus of left knee, subsequent encounter M54.50 (ICD-10-CM) - Acute bilateral low back pain without sciatica      THERAPY DIAG:  Muscle weakness (generalized)  Other abnormalities of gait and mobility  Other low back pain  Rationale for Evaluation and Treatment: Rehabilitation  ONSET DATE: ~3 weeks ago  SUBJECTIVE:   SUBJECTIVE STATEMENT: Patient reports having 1 bout of what felt like sciatica down the  left leg since last visit. The pain has been more localized to the left side of the low back. The midline pain has improved. Her X-ray did show a mild L3 compression fracture and was told that it will heal on its own and to continue with PT. She is following up with Dr. Elva Hamburger on Friday, wanting to discuss medication recommendations regarding osteopenia.   From eval: Pt states she's had issues with both legs in the last 2 years. Pt was having L hip and leg pain in December. Pt was walking and her knee gave way ~3 weeks ago. Pt states she has been able to do her 12 hour shifts and be careful. Pt reports pain was not so bad she needed an injection.   PERTINENT HISTORY: Frieburgs disease R foot (AVN of metatarsal), OA R knee   PAIN:  Are you having pain? Yes: NPRS scale: none currently; at worst 2/10  Pain location: left low back Pain description: ache Aggravating factors: end of 12 hour shift, twisting (knee); bending, valsalva (back)  Relieving factors: tylenol , ice and knee brace  PRECAUTIONS: None  RED FLAGS: None   WEIGHT BEARING RESTRICTIONS: No  FALLS:  Has patient fallen in last 6 months? No  LIVING ENVIRONMENT: Lives with: lives with their spouse Lives in: House/apartment Stairs: Yes: External: 2 steps; none Has following equipment at home: None  OCCUPATION: ICU nurse at [employer]  PATIENT GOALS: Return to normal activities (pt bikes and rows x20 min each)  NEXT MD VISIT: 02/28/24 (Dr. Elva Hamburger)  OBJECTIVE:  Note: Objective measures were completed at Evaluation unless otherwise noted.  DIAGNOSTIC FINDINGS: MRI 11/19/23 L knee IMPRESSION: 1. Complete tear at the root of the posterior horn of the medial meniscus, with near absence of meniscal tissue within the 3 mm of the posterior horn closest to the root. Associated mild extrusion of the body of the medial meniscus. 2. Tricompartmental cartilage degenerative changes, severe within the lateral patellofemoral cartilage. 3. Small  joint effusion.  Lumbar X-ray  IMPRESSION: 1. Mild compression deformity of superior endplate of L3 new compared to prior exam. Age indeterminate. 2. Mild degenerative joint changes of lumbar spine.  PATIENT SURVEYS:  Lower Extremity Functional Score: 47 / 80 = 58.8 % 01/22/24: 59/80.   EDEMA:  "A little edema"  MUSCLE LENGTH: Hamstrings: Left slightly tighter than Right Thomas test: Did not assess  PALPATION: Mostly tender about medial L knee joint line and along edema around medial patella  01/22/24: L spine hypomobility, pain at L2-L4 PAIVM; tautness bilateral lumbar paraspinals   LOWER EXTREMITY ROM:  Active ROM Right eval Left eval 12/31/23   Hip flexion     Hip extension     Hip abduction     Hip adduction     Hip internal rotation     Hip external rotation     Knee flexion 125 125 Lt: 140; Rt: 140   Knee extension 0 -5 Lt: 0; Rt: 0   Ankle dorsiflexion     Ankle plantarflexion     Ankle  inversion     Ankle eversion      (Blank rows = not tested)  LOWER EXTREMITY MMT:  MMT Right eval Left eval 01/22/24  Hip flexion 5 4* 5 bilateral; pain in low back with RLE  Hip extension 4 3- 4 bilateral   Hip abduction 5 3+ 4 bilateral   Hip adduction 5 3+ 4+ bilateral   Hip internal rotation     Hip external rotation     Knee flexion 5 5   Knee extension 5 5   Ankle dorsiflexion     Ankle plantarflexion     Ankle inversion     Ankle eversion      (Blank rows = not tested, * = pain) LUMBAR ROM:   Active  AROM  01/22/24 AROM  02/13/24  Flexion Full  Full no pain  Extension Full pain center of low back Full no pain  Right lateral flexion WNL pain Rt low back WNL No pain  Left lateral flexion WNL WNL no pain  Right rotation WNL low back pain WNL no pain  Left rotation WNL low back pain WNL mild pain L low back   (Blank rows = not tested)  LOWER EXTREMITY SPECIAL TESTS:  Hip special tests: Portia Brittle (FABER) test: negative and Craig's test:  negative  FUNCTIONAL TESTS:  5x STS: 15.92 sec SLS: R 20 sec, L 19 sec  01/22/24: 5 x STS: 8.74 sec   GAIT: Distance walked: Into clinic Assistive device utilized: L knee brace Level of assistance: Complete Independence Comments: Mildly antalgic with L knee slightly kept in flexion  [facility] Adult PT Treatment:                                                DATE: 02/18/24  Manual Therapy: STM Lt lumbar paraspinals Demo and returned demo of use of tennis ball for self-soft tissue mobilization Neuromuscular re-ed: Modified single leg RDL reaching to chair 2 x 10  Pallof press green band 2 x 10  Therapeutic Activity: Dead lift to 6 inch step; 5 lbs kettle bell 2 x 10   Self Care: Reviewed lifting mechanics Educated on avoid trunk flexion and provided handouts regarding osteopenia  Access Code: [email or web address] URL: [email or web address] Date: 02/18/2024 Prepared by: Jaunita Messier Adult PT Treatment:                                                DATE: 02/13/2024 Therapeutic Exercise: Lumbar AROM (see above) LTR x 1 min Wide leg LTR x 1 min Supine bridges x 10 --> modified single leg bridge (opp foot propped on block) x10 each leg Neuromuscular re-ed: 1/2 kneeling + diagonal reaches with 4#MB x10 each side Hip hinge in lunge + 5#KB rows with UE support Bent over lunge hold 2x30"  Therapeutic Activity: Body mechanics with bed work tasks --> reaching over bed to pull towards her --> reaching over bed + pulling on orange superband with mini squat    [facility] Adult PT Treatment:  DATE: 02/10/2024 Therapeutic Exercise: Staggered stance trunk rotation + double GTB x10 (B) Staggered stance pull down + GTB 2x10 Neuromuscular re-ed: Standing with back against wall --> Postural awareness & core bracing + 2#DB: Low bicep curls High bicep curl --> front & scaption 90 degrees Zip up  Cactus arm reach  Small circles to low to 90  deg Bent over hip hinge: Boxing Reverse fly Standing: Chest expansion Side stretch Lunge stance + straight arm isometric press into green bolster on wall --> midline press    [facility] Adult PT Treatment:                                                DATE: 02/06/2024 Therapeutic Exercise: LTR --> wide leg LTR --> figure 4 LTR Dead bug Double leg stretch Scissors Side Lying: Hydrants + blue TB Clamshells + blue TB Straight leg hip abd + blue TB Seated red PB roll out stretch  Neuromuscular re-ed: Lunge + straight arm press down with RTB x10 each side Squat + row with GTB x10, blue TB x10  Lunge stance + straight arm isometric press into green bolster on wall --> fwd & midline  Bent over lunge static hold 2x30" --> back heel stabilized at wall          PATIENT EDUCATION:  Education details: HEP update  Person educated: Patient Education method: Explanation, demo, cues, handout Education comprehension: verbalized understanding, returned demo, cues   HOME EXERCISE PROGRAM: Access Code: [email or web address] URL: [email or web address] Date: 01/14/2024 Prepared by: Forrestine Ike Instructed to complete 2-3 times weekly.   Exercises - Runner's Step Up/Down  - 1 x daily - 7 x weekly - 2 sets - 10 reps - Bridge with Hip Abduction and Resistance  - 1 x daily - 7 x weekly - 2 sets - 10 reps - Forward Step Down Touch with Heel  - 1 x daily - 7 x weekly - 2 sets - 10 reps - The Diver  - 1 x daily - 7 x weekly - 2 sets - 10 reps - Lunge Matrix on Slider  - 1 x daily - 7 x weekly - 2 sets - 10 reps - Standing 3-way Hip with Walker  - 1 x daily - 7 x weekly - 2 sets - 10 reps - Side Stepping with Resistance at Ankles  - 1 x daily - 7 x weekly - 2 sets - 10 reps - Backward Band Walks with Resistance at Thighs and Ankles  - 1 x daily - 7 x weekly - 2 sets - 10 reps - Forward Band Walks with Resistance at Thighs and Ankles  - 1 x daily - 7 x weekly - 2 sets - 10 reps Access Code:  [email or web address] URL: [email or web address] Date: 02/18/2024 Prepared by: Forrestine Ike  Exercises - Supine Lower Trunk Rotation  - 1 x daily - 7 x weekly - 1 sets - 10 reps - Supine Figure 4 Piriformis Stretch  - 1 x daily - 7 x weekly - 3 sets - 30  hold - Supine Pelvic Tilt with Straight Leg Raise  - 1 x daily - 7 x weekly - 2 sets - 10 reps - Supine 90/90 Alternating Toe Touch  - 1 x daily - 7 x weekly - 2 sets - 10 reps - Sidelying Thoracic Rotation with Open Book  -  1 x daily - 7 x weekly - 2 sets - 10 reps - Supine Dead Bug with Leg Extension  - 1 x daily - 7 x weekly - 3 sets - 10 reps - Bird Dog  - 1 x daily - 7 x weekly - 3 sets - 10 reps - Standing Hip Hinge with Dowel  - 1 x daily - 7 x weekly - 2 sets - 10 reps   ASSESSMENT:  CLINICAL IMPRESSION:  Patient arrives without reports of pain. She has plans to f/u with Dr. Elva Hamburger on Friday and wants to discuss medication regarding osteopenia and was recommended to discuss potential PT referral if indicated for osteopenia. Focused on progression of lifting mechanics with patient demonstrating proper form with deadlift of light weights. She is noted to have tautness about Lt lumbar paraspinals and was instructed on use of tennis ball for self soft tissue mobilization as part of HEP.   RE-EVAL: Re-assessment of the Lt knee and re-evaluation performed for new referral for acute bilateral low back pain without sciatica. She has progressed well in regards to her Lt knee reporting occasional mild levels of pain with full ROM and strength about the Lt knee. She has met majority of goals related to the knee, with exception of hip strength goal. We discussed continuing with HEP 2-3 x weekly for maintenance/independent progression and that we will be focusing our treatment on the acute low back pain which began at the beginning of March from repetitive coughing due to COVID. Upon assessment she is noted to have good lumbar AROM, though experiences  pain with extension, rotation, and Rt lateral flexion. She has tautness about lumbar paraspinals and L-spine hypomobility with pain noted during PAIVM L2-L4. She has hip and core weakness and reports increased pain with functional bending and pushing tasks at work. She will benefit from skilled PT to address the above stated deficits in order to optimize her function and assist in overall pain reduction.   From eval: Patient is a 63 y.o. F who was seen today for physical therapy evaluation and treatment for L knee meniscus tear. PMH significant for Frieburg's on R foot. Assessment demos generally good L knee quad strength; however, decreased lateral and medial knee stability with her pain most notable during twisting/rotary motions. Pt demos decreased hip adductor, abd and glute/extensor strength affecting her tolerance for prolonged standing and walking tasks. Pt will benefit from PT to address these deficits for return to normal function.   OBJECTIVE IMPAIRMENTS: Abnormal gait, decreased activity tolerance, decreased endurance, decreased mobility, difficulty walking, decreased ROM, decreased strength, increased edema, increased fascial restrictions, improper body mechanics, and pain.     GOALS: Goals reviewed with patient? Yes  SHORT TERM GOALS: Target date: 12/26/2023  Pt will be ind with initial HEP Baseline: Goal status: MET  2.  Pt will demo L = R knee ROM Baseline:  Goal status: MET   LONG TERM GOALS: Target date: 03/07/24  Pt will be ind with management and progression of HEP for full return to her normal gym activities (I.e. rowing and biking) Baseline:  02/13/24: 20 min x 4 days/week Goal status: MET   2.  Pt will report >/=75% improvement in her overall knee pain with activity Baseline:  01/22/24: reports 75% improvement  Goal status: MET  3.  Pt will demo improved 5x STS to </=10 sec for increased functional LE strength Baseline:  Goal status: MET  4.  Pt will demo at  least 4+/5 hip  abductor and extensor for improved knee and lumbopelvic stability.  Baseline:  Goal status: REVISED   5.  Pt will have improved LEFS to >/=56/80 to demo MCID Baseline:  Goal status: MET  6. Patient will be able to sustained bent over lunge positioning for at least 30 seconds without pain to improve tolerance to work specific activity.   Baseline: 6/10 pain with bedside work   02/13/24: no pain  Goal status: MET  7. Patient will demonstrate pain free lumbar AROM to improve ability to complete reaching and bending tasks.   Baseline: see AROM above 02/13/24: mild pain with L rotation  Goal Status: IN PROGRESS   PLAN:  PT FREQUENCY: 2x/week  PT DURATION: 6 weeks  PLANNED INTERVENTIONS: 97164- PT Re-evaluation, 97110-Therapeutic exercises, 97530- Therapeutic activity, 97112- Neuromuscular re-education, 97535- Self Care, 56213- Manual therapy, [id number]- Gait training, [phone]- Aquatic Therapy, [id number]- Electrical stimulation (unattended), [id number]- Electrical stimulation (manual), [id number]- Vasopneumatic device, [id number]- Ultrasound, [id number]- Traction (mechanical), [id number]- Ionotophoresis 4mg /ml Dexamethasone , Patient/Family education, Balance training, Stair training, Taping, Dry Needling, Joint mobilization, Spinal mobilization, Cryotherapy, and Moist heat  PLAN FOR NEXT SESSION: Progress HEP prn (back); core stabilization. Lifting mechanics (hip hinge in standing!); squatting/kneeling/lunge activities  Forrestine Ike, PT, DPT, ATC 02/18/24 4:19 PM

## 2024-02-19 ENCOUNTER — Other Ambulatory Visit: Payer: Self-pay

## 2024-02-19 ENCOUNTER — Ambulatory Visit: Attending: Internal Medicine | Admitting: Pharmacist

## 2024-02-19 ENCOUNTER — Encounter: Payer: Self-pay | Admitting: Pharmacist

## 2024-02-19 ENCOUNTER — Other Ambulatory Visit: Payer: Self-pay | Admitting: Pharmacist

## 2024-02-19 ENCOUNTER — Telehealth: Admitting: Physician Assistant

## 2024-02-19 DIAGNOSIS — A084 Viral intestinal infection, unspecified: Secondary | ICD-10-CM

## 2024-02-19 DIAGNOSIS — Z7189 Other specified counseling: Secondary | ICD-10-CM

## 2024-02-19 MED ORDER — TYMLOS 3120 MCG/1.56ML ~~LOC~~ SOPN: PEN_INJECTOR | SUBCUTANEOUS | 3 refills | Status: DC

## 2024-02-19 MED ORDER — ONDANSETRON 4 MG PO TBDP: 4.0000 mg | ORAL_TABLET | Freq: Three times a day (TID) | ORAL | 0 refills | Status: DC | PRN

## 2024-02-19 MED ORDER — TYMLOS 3120 MCG/1.56ML ~~LOC~~ SOPN
PEN_INJECTOR | SUBCUTANEOUS | 3 refills | Status: AC
  Filled 2024-02-19: qty 4.68, fill #0
  Filled 2024-02-24: qty 1.56, 30d supply, fill #0
  Filled 2024-03-13: qty 1.56, 30d supply, fill #1
  Filled 2024-04-20: qty 1.56, 30d supply, fill #2
  Filled 2024-05-13: qty 1.56, 30d supply, fill #3
  Filled 2024-06-02 – 2024-06-08 (×3): qty 1.56, 30d supply, fill #4
  Filled 2024-07-20: qty 1.56, 30d supply, fill #5
  Filled 2024-08-17: qty 1.56, 30d supply, fill #6
  Filled 2024-09-16: qty 1.56, 30d supply, fill #7
  Filled 2024-10-13: qty 1.56, 30d supply, fill #8
  Filled 2024-11-06: qty 1.56, 30d supply, fill #9

## 2024-02-19 MED ORDER — PEN NEEDLES 31G X 5 MM MISC
3 refills | Status: AC
  Filled 2024-02-24: qty 100, 90d supply, fill #0
  Filled 2024-08-20: qty 100, 90d supply, fill #1

## 2024-02-19 NOTE — Progress Notes (Signed)
 I have spent 5 minutes in review of e-visit questionnaire, review and updating patient chart, medical decision making and response to patient.   Piedad Climes, PA-C

## 2024-02-19 NOTE — Progress Notes (Signed)
 Pharmacy Patient Advocate Encounter  Insurance verification completed.   The patient is insured through [facility]   Ran test claim for Tymlos. Co-pay is $0. Patient has copay card.   This test claim was processed through [facility]- copay amounts may vary at other pharmacies due to pharmacy/plan contracts, or as the patient moves through the different stages of their insurance plan.

## 2024-02-19 NOTE — Progress Notes (Signed)

## 2024-02-19 NOTE — Progress Notes (Signed)
   S: Patient presents today for review of their specialty medication.   Patient is considering taking Tymlos  for osteopenia. Patient is managed by NP [facility] for this.   Dosing: 80 mcg once daily  Adherence: has not started. Has an appointment with her Orthopedist this Friday (02/21/24) to discuss the plan with them. Then she plans on making a decision regarding therapy or not.   Efficacy: has not started  Monitoring:  -Baseline BMD: performed in June 2024 showed osteopenia, recommended scans once every 1-2 years once on therapy.  -Urinary calcium excretion: performed while on therapy in certain patients  -Orthostatic hypotension: usually occurs within 4 hours after injection; none noted currently  Current adverse effects: -Injection site pain/rxn: has not yet started therapy -Abdominal pain, NV, diarrhea: has not yet started therapy -Joint pain: has not yet started therapy -HA/dizziness: has not yet started therapy   O:     Lab Results  Component Value Date   WBC 5.2 12/04/2023   HGB 12.9 12/04/2023   HCT 39.6 12/04/2023   MCV 97 12/04/2023   PLT 272 12/04/2023      Chemistry      Component Value Date/Time   NA 141 12/04/2023 0826   K 4.9 12/04/2023 0826   CL 104 12/04/2023 0826   CO2 25 12/04/2023 0826   BUN 26 12/04/2023 0826   CREATININE 0.71 12/04/2023 0826      Component Value Date/Time   CALCIUM 9.2 12/04/2023 0826   ALKPHOS 72 12/04/2023 0826   AST 19 12/04/2023 0826   ALT 14 12/04/2023 0826   BILITOT <0.2 12/04/2023 0826       A/P: 1. Medication review: patient currently considering Tymlos  for osteopenia. Reviewed the medication and our process of filling with the patient. Tymlos  (Abaloparatide ) is a human PTH analogue that increases osteoblast activity and, in turn, promotes increased bone mass. It is supplied as a subcutaneous solution and should be stored in the fridge prior to use. After use, it can be stored at room temperature until supply is  exhausted or 30 days. Patients should be monitored for orthostatic hypotension after injecting. Common adverse effects include injection site reaction, dizziness, HA, and joint pain. Less common adverse effects include GI upset, abdominal pain, hypercalciuria, and hypercalcemia. No recommendation for any changes at this time. Patient plans to further discuss therapy with her orthopedist this coming Friday. She will let us  know if she plans to proceed with therpy after that visit.   Marene Shape, PharmD, Becky Bowels, CPP Clinical Pharmacist [facility] [phone]

## 2024-02-19 NOTE — Progress Notes (Signed)
 Please see OV from 02/19/24 for complete documentation.   Marene Shape, PharmD, Becky Bowels, CPP Clinical Pharmacist [facility] [phone]

## 2024-02-19 NOTE — Progress Notes (Signed)
 Patient is seeing orthopedic md this Friday to make sure she wants to proceed with treatment. She will call us  Friday or Monday to let us  know what she decides.

## 2024-02-21 ENCOUNTER — Ambulatory Visit: Admitting: Sports Medicine

## 2024-02-21 ENCOUNTER — Ambulatory Visit

## 2024-02-21 DIAGNOSIS — M25561 Pain in right knee: Secondary | ICD-10-CM

## 2024-02-21 DIAGNOSIS — M25562 Pain in left knee: Secondary | ICD-10-CM | POA: Diagnosis not present

## 2024-02-21 DIAGNOSIS — M5459 Other low back pain: Secondary | ICD-10-CM | POA: Diagnosis not present

## 2024-02-21 DIAGNOSIS — M25661 Stiffness of right knee, not elsewhere classified: Secondary | ICD-10-CM | POA: Diagnosis not present

## 2024-02-21 DIAGNOSIS — R252 Cramp and spasm: Secondary | ICD-10-CM | POA: Diagnosis not present

## 2024-02-21 DIAGNOSIS — M6281 Muscle weakness (generalized): Secondary | ICD-10-CM | POA: Diagnosis not present

## 2024-02-21 DIAGNOSIS — S83207D Unspecified tear of unspecified meniscus, current injury, left knee, subsequent encounter: Secondary | ICD-10-CM

## 2024-02-21 DIAGNOSIS — R262 Difficulty in walking, not elsewhere classified: Secondary | ICD-10-CM

## 2024-02-21 DIAGNOSIS — R2689 Other abnormalities of gait and mobility: Secondary | ICD-10-CM | POA: Diagnosis not present

## 2024-02-21 DIAGNOSIS — M810 Age-related osteoporosis without current pathological fracture: Secondary | ICD-10-CM | POA: Diagnosis not present

## 2024-02-21 DIAGNOSIS — M47816 Spondylosis without myelopathy or radiculopathy, lumbar region: Secondary | ICD-10-CM | POA: Diagnosis not present

## 2024-02-21 NOTE — Progress Notes (Signed)
    Procedures performed today:    None.  Independent interpretation of notes and tests performed by another provider:   None.  Brief History, Exam, Impression, and Recommendations:    Age-related osteoporosis without current pathological fracture Danielle Braun returns, she is a very pleasant 63 year old female, she did have what appeared to be a new L3 vertebral compression fracture. She does have an osteoporosis provider, osteopenia on bone density test, they are planning Tymlos  for a year followed by an antiresorptive agent such as Prolia. I think this is appropriate, we did discuss the anabolic agents including Tymlos , Forteo, Evenity. She will think about whether she wants to continue with the daily Tymlos  injection or switch to a monthly Evenity shot. She either way does not have to see me again for this.  Lumbar spondylosis Send he was found to have an L3 vertebral compression fracture, she is currently getting treatment for her osteoporosis, she longer has pain along the L3 spinous process. She does have some lower lumbar pain likely related to existing lumbar spondylosis, this has responded dramatically well to formal physical therapy.  Traumatic tear of meniscus of left knee Knee MRI did ultimately show some meniscal tearing, PT has been very beneficial and she no longer has discomfort.  I spent 30 minutes of total time managing this patient today, this includes chart review, face to face, and non-face to face time.  ____________________________________________ Joselyn Nicely. Sandy Crumb, M.D., ABFM., CAQSM., AME. Primary Care and Sports Medicine [facility]  Adjunct Professor of [facility] Medicine  [employer]  Restaurant manager, fast food

## 2024-02-21 NOTE — Therapy (Signed)
 OUTPATIENT PHYSICAL THERAPY TREATMENT    Patient Name: Danielle Braun MRN: [id number] DOB:May 04, 1961, 63 y.o., female Today's Date: 02/21/2024  END OF SESSION:  PT End of Session - 02/21/24 1013     Visit Number 20    Number of Visits 23    Date for PT Re-Evaluation 03/07/24    Authorization Type [employer]    PT Start Time 1012    PT Stop Time 1105    PT Time Calculation (min) 53 min    Activity Tolerance Patient tolerated treatment well    Behavior During Therapy WFL for tasks assessed/performed             Past Medical History:  Diagnosis Date   Acne    Arrhythmia    Bursitis, subacromial    Left   Endometriosis    GERD (gastroesophageal reflux disease)    Joint pain    Palpitations    Retinal tear    Sinusitis    SOB (shortness of breath)    Vitamin D  deficiency    Past Surgical History:  Procedure Laterality Date   ABDOMINAL HYSTERECTOMY     ABDOMINAL SURGERY     csection   DILATION AND CURETTAGE OF UTERUS     SHOULDER ARTHROSCOPY WITH SUBACROMIAL DECOMPRESSION Left 10/31/2017   Procedure: SHOULDER ARTHROSCOPY WITH BURSECTOMY AND SUBACROMIAL DECOMPRESSION;  Surgeon: Sammye Cristal, MD;  Location: [facility] OR;  Service: Orthopedics;  Laterality: Left;  Left shoulder arthroscopy bursectomy and subacromial decompression   TONSILLECTOMY     Patient Active Problem List   Diagnosis Date Noted   Acute low back pain 01/21/2024   COVID 12/26/2023   Chronic cough 12/26/2023   Deviated nasal septum 12/26/2023   Hypertrophy of nasal turbinates 12/26/2023   Traumatic tear of meniscus of left knee 11/15/2023   Prediabetes 04/17/2023   History of colonic polyps 04/17/2023   Other fatigue 04/16/2023   SOB (shortness of breath) on exertion 04/16/2023   Health care maintenance 04/16/2023   Family history of diabetes mellitus 04/16/2023   Depression screening 04/16/2023   Generalized obesity 04/16/2023   BMI 30.0-30.9,adult 04/16/2023   Seasonal allergies  11/07/2022   Family history of osteoporosis 11/06/2022   Postmenopause 11/06/2022   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   Obesity (BMI 30-39.9) 01/26/2022   Patellofemoral pain syndrome of right knee 01/17/2022   Freiberg's disease, right 11/09/2021   Age-related osteoporosis without current pathological fracture 11/01/2021   Chronic sinusitis, unspecified 11/01/2021   Retinal tear of left eye 07/05/2021   History of laparoscopic-assisted vaginal hysterectomy 03/31/2021   History of sacrocolpopexy 03/31/2021   Blepharitis of upper and lower eyelids of both eyes 08/23/2020   Dry eye syndrome of both lacrimal glands 08/23/2020   Nuclear sclerotic cataract of both eyes 08/23/2020   Primary refluxing megaureter 03/02/2020   Sensation of feeling hot 01/18/2020   Chronic left shoulder pain 11/11/2018   Vitamin D  insufficiency 11/11/2018   Low bone mass 09/26/2017   Immune to measles 02/20/2016   Allergic rhinitis 02/06/2016   GERD (gastroesophageal reflux disease) 02/06/2016   Hyperlipemia 02/06/2016   Keratoconjunctivitis sicca of both eyes not specified as Sjogren's 02/06/2016   Myopia of both eyes with astigmatism 02/06/2016   Posterior vitreous detachment of left eye 02/06/2016   Premature atrial contraction 02/06/2016   History of right salpingo-oophorectomy 03/14/2010   Acute recurrent maxillary sinusitis 11/06/2008    PCP: Jayne Mews, MD  REFERRING PROVIDER: Gean Keels, MD  REFERRING DIAG:  E45.409W (ICD-10-CM) - Traumatic tear of meniscus of left knee, subsequent encounter M54.50 (ICD-10-CM) - Acute bilateral low back pain without sciatica      THERAPY DIAG:  Muscle weakness (generalized)  Other abnormalities of gait and mobility  Other low back pain  Acute pain of right knee  Acute pain of left knee  Stiffness of right knee, not elsewhere classified  Difficulty in walking, not elsewhere classified  Cramp and spasm  Rationale for  Evaluation and Treatment: Rehabilitation  ONSET DATE: ~3 weeks ago  SUBJECTIVE:   SUBJECTIVE STATEMENT: Patient reports she is meeting with Dr. Elva Hamburger after PT today and will discuss osteoporosis medication with him. Patient states her knee is doing well and hasn't had any noticeable pain; she has been using knee brace as needed for stability. Patient states her back has been doing better at work.   From eval: Pt states she's had issues with both legs in the last 2 years. Pt was having L hip and leg pain in December. Pt was walking and her knee gave way ~3 weeks ago. Pt states she has been able to do her 12 hour shifts and be careful. Pt reports pain was not so bad she needed an injection.   PERTINENT HISTORY: Frieburgs disease R foot (AVN of metatarsal), OA R knee   PAIN:  Are you having pain? Yes: NPRS scale: none currently; at worst 2/10  Pain location: left low back Pain description: ache Aggravating factors: end of 12 hour shift, twisting (knee); bending, valsalva (back)  Relieving factors: tylenol , ice and knee brace  PRECAUTIONS: None  RED FLAGS: None   WEIGHT BEARING RESTRICTIONS: No  FALLS:  Has patient fallen in last 6 months? No  LIVING ENVIRONMENT: Lives with: lives with their spouse Lives in: House/apartment Stairs: Yes: External: 2 steps; none Has following equipment at home: None  OCCUPATION: ICU nurse at [employer]  PATIENT GOALS: Return to normal activities (pt bikes and rows x20 min each)  NEXT MD VISIT: 02/28/24 (Dr. Elva Hamburger)  OBJECTIVE:  Note: Objective measures were completed at Evaluation unless otherwise noted.  DIAGNOSTIC FINDINGS: MRI 11/19/23 L knee IMPRESSION: 1. Complete tear at the root of the posterior horn of the medial meniscus, with near absence of meniscal tissue within the 3 mm of the posterior horn closest to the root. Associated mild extrusion of the body of the medial meniscus. 2. Tricompartmental cartilage degenerative changes, severe  within the lateral patellofemoral cartilage. 3. Small joint effusion.  Lumbar X-ray  IMPRESSION: 1. Mild compression deformity of superior endplate of L3 new compared to prior exam. Age indeterminate. 2. Mild degenerative joint changes of lumbar spine.  PATIENT SURVEYS:  Lower Extremity Functional Score: 47 / 80 = 58.8 % 01/22/24: 59/80.   EDEMA:  "A little edema"  MUSCLE LENGTH: Hamstrings: Left slightly tighter than Right Thomas test: Did not assess  PALPATION: Mostly tender about medial L knee joint line and along edema around medial patella  01/22/24: L spine hypomobility, pain at L2-L4 PAIVM; tautness bilateral lumbar paraspinals   LOWER EXTREMITY ROM:  Active ROM Right eval Left eval 12/31/23   Hip flexion     Hip extension     Hip abduction     Hip adduction     Hip internal rotation     Hip external rotation     Knee flexion 125 125 Lt: 140; Rt: 140   Knee extension 0 -5 Lt: 0; Rt: 0   Ankle dorsiflexion     Ankle  plantarflexion     Ankle inversion     Ankle eversion      (Blank rows = not tested)  LOWER EXTREMITY MMT:  MMT Right eval Left eval 01/22/24 02/21/24  Hip flexion 5 4* 5 bilateral; pain in low back with RLE   Hip extension 4 3- 4 bilateral  4 bilateral   Hip abduction 5 3+ 4 bilateral  4+ bilateral  Hip adduction 5 3+ 4+ bilateral    Hip internal rotation      Hip external rotation      Knee flexion 5 5    Knee extension 5 5    Ankle dorsiflexion      Ankle plantarflexion      Ankle inversion      Ankle eversion       (Blank rows = not tested, * = pain) LUMBAR ROM:   Active  AROM  01/22/24 AROM  02/13/24  Flexion Full  Full no pain  Extension Full pain center of low back Full no pain  Right lateral flexion WNL pain Rt low back WNL No pain  Left lateral flexion WNL WNL no pain  Right rotation WNL low back pain WNL no pain  Left rotation WNL low back pain WNL mild pain L low back   (Blank rows = not tested)  LOWER EXTREMITY  SPECIAL TESTS:  Hip special tests: Portia Brittle (FABER) test: negative and Craig's test: negative  FUNCTIONAL TESTS:  5x STS: 15.92 sec SLS: R 20 sec, L 19 sec  01/22/24: 5 x STS: 8.74 sec   GAIT: Distance walked: Into clinic Assistive device utilized: L knee brace Level of assistance: Complete Independence Comments: Mildly antalgic with L knee slightly kept in flexion   [facility] Adult PT Treatment:                                                DATE: 02/21/2024 Therapeutic Exercise: LTR Hip abd/ext MMT (se above) Standing: Bicep curls + 5#DBs Bicep curl + arnold press + 5#DBs Supine: Chest press + 5#DB Tricep press (skull crusher) + 3#DBs Neuromuscular re-ed: Prone: Straight leg hip extension x10  Bent knee hip extension x10 HS curls + RTB x10 Therapeutic Activity: Seated flat back hip hinge x10  Lifting technique + maintaining postural precautions with osteopenia Moving weighted bags from low to high shelf     [facility] Adult PT Treatment:                                                DATE: 02/18/24  Manual Therapy: STM Lt lumbar paraspinals Demo and returned demo of use of tennis ball for self-soft tissue mobilization Neuromuscular re-ed: Modified single leg RDL reaching to chair 2 x 10  Pallof press green band 2 x 10  Therapeutic Activity: Dead lift to 6 inch step; 5 lbs kettle bell 2 x 10   Self Care: Reviewed lifting mechanics Educated on avoid trunk flexion and provided handouts regarding osteopenia  Access Code: [email or web address] URL: [email or web address] Date: 02/18/2024 Prepared by: Samantha Pexa   [facility] Adult PT Treatment:  DATE: 02/13/2024 Therapeutic Exercise: Lumbar AROM (see above) LTR x 1 min Wide leg LTR x 1 min Supine bridges x 10 --> modified single leg bridge (opp foot propped on block) x10 each leg Neuromuscular re-ed: 1/2 kneeling + diagonal reaches with 4#MB x10 each side Hip hinge in lunge +  5#KB rows with UE support Bent over lunge hold 2x30"  Therapeutic Activity: Body mechanics with bed work tasks --> reaching over bed to pull towards her --> reaching over bed + pulling on orange superband with mini squat          PATIENT EDUCATION:  Education details: HEP update  Person educated: Patient Education method: Programmer, multimedia, demo, cues, handout Education comprehension: verbalized understanding, returned demo, cues   HOME EXERCISE PROGRAM: Access Code: [email or web address] URL: [email or web address] Date: 02/21/2024 Prepared by: Sims Duck  Exercises - Runner's Step Up/Down  - 1 x daily - 7 x weekly - 2 sets - 10 reps - Bridge with Hip Abduction and Resistance  - 1 x daily - 7 x weekly - 2 sets - 10 reps - Forward Step Down Touch with Heel  - 1 x daily - 7 x weekly - 2 sets - 10 reps - The Diver  - 1 x daily - 7 x weekly - 2 sets - 10 reps - Lunge Matrix on Slider  - 1 x daily - 7 x weekly - 2 sets - 10 reps - Standing 3-way Hip with Walker  - 1 x daily - 7 x weekly - 2 sets - 10 reps - Side Stepping with Resistance at Ankles  - 1 x daily - 7 x weekly - 2 sets - 10 reps - Backward Band Walks with Resistance at Thighs and Ankles  - 1 x daily - 7 x weekly - 2 sets - 10 reps - Forward Band Walks with Resistance at Thighs and Ankles  - 1 x daily - 7 x weekly - 2 sets - 10 reps - Standing Bicep Curls Supinated with Dumbbells  - 1 x daily - 7 x weekly - 3 sets - 10 reps - Shoulder Overhead Press in Abduction with Dumbbells  - 1 x daily - 7 x weekly - 3 sets - 10 reps - Supine Chest Press with Dumbbells  - 1 x daily - 7 x weekly - 3 sets - 10 reps - Supine Elbow Flexion Extension with Dumbbell  - 1 x daily - 7 x weekly - 3 sets - 10 reps   Access Code: [email or web address] URL: [email or web address] Date: 02/18/2024 Prepared by: Forrestine Ike  Exercises - Supine Lower Trunk Rotation  - 1 x daily - 7 x weekly - 1 sets - 10 reps - Supine Figure 4 Piriformis Stretch  -  1 x daily - 7 x weekly - 3 sets - 30  hold - Supine Pelvic Tilt with Straight Leg Raise  - 1 x daily - 7 x weekly - 2 sets - 10 reps - Supine 90/90 Alternating Toe Touch  - 1 x daily - 7 x weekly - 2 sets - 10 reps - Sidelying Thoracic Rotation with Open Book  - 1 x daily - 7 x weekly - 2 sets - 10 reps - Supine Dead Bug with Leg Extension  - 1 x daily - 7 x weekly - 3 sets - 10 reps - Bird Dog  - 1 x daily - 7 x weekly - 3 sets - 10 reps - Standing Hip Hinge with Dowel  - 1 x daily -  7 x weekly - 2 sets - 10 reps   ASSESSMENT:  CLINICAL IMPRESSION:  Lifting technique reviewed and provided instruction for proper body mechanics with work-related tasks. Precautions for osteopenia reviewed and applied with work activities. Upper body strengthening added to HEP to increase strength for postural support and strength with lifting/carrying work related duties.  RE-EVAL: Re-assessment of the Lt knee and re-evaluation performed for new referral for acute bilateral low back pain without sciatica. She has progressed well in regards to her Lt knee reporting occasional mild levels of pain with full ROM and strength about the Lt knee. She has met majority of goals related to the knee, with exception of hip strength goal. We discussed continuing with HEP 2-3 x weekly for maintenance/independent progression and that we will be focusing our treatment on the acute low back pain which began at the beginning of March from repetitive coughing due to COVID. Upon assessment she is noted to have good lumbar AROM, though experiences pain with extension, rotation, and Rt lateral flexion. She has tautness about lumbar paraspinals and L-spine hypomobility with pain noted during PAIVM L2-L4. She has hip and core weakness and reports increased pain with functional bending and pushing tasks at work. She will benefit from skilled PT to address the above stated deficits in order to optimize her function and assist in overall pain  reduction.   From eval: Patient is a 63 y.o. F who was seen today for physical therapy evaluation and treatment for L knee meniscus tear. PMH significant for Frieburg's on R foot. Assessment demos generally good L knee quad strength; however, decreased lateral and medial knee stability with her pain most notable during twisting/rotary motions. Pt demos decreased hip adductor, abd and glute/extensor strength affecting her tolerance for prolonged standing and walking tasks. Pt will benefit from PT to address these deficits for return to normal function.   OBJECTIVE IMPAIRMENTS: Abnormal gait, decreased activity tolerance, decreased endurance, decreased mobility, difficulty walking, decreased ROM, decreased strength, increased edema, increased fascial restrictions, improper body mechanics, and pain.     GOALS: Goals reviewed with patient? Yes  SHORT TERM GOALS: Target date: 12/26/2023  Pt will be ind with initial HEP Baseline: Goal status: MET  2.  Pt will demo L = R knee ROM Baseline:  Goal status: MET   LONG TERM GOALS: Target date: 03/07/24  Pt will be ind with management and progression of HEP for full return to her normal gym activities (I.e. rowing and biking) Baseline:  02/13/24: 20 min x 4 days/week Goal status: MET   2.  Pt will report >/=75% improvement in her overall knee pain with activity Baseline:  01/22/24: reports 75% improvement  Goal status: MET  3.  Pt will demo improved 5x STS to </=10 sec for increased functional LE strength Baseline:  Goal status: MET  4.  Pt will demo at least 4+/5 hip abductor and extensor for improved knee and lumbopelvic stability.  Baseline:  02/21/24: hip extension 4/5 (bilateral) Goal status: IN PROGRESS   5.  Pt will have improved LEFS to >/=56/80 to demo MCID Baseline:  Goal status: MET  6. Patient will be able to sustained bent over lunge positioning for at least 30 seconds without pain to improve tolerance to work specific  activity.   Baseline: 6/10 pain with bedside work   02/13/24: no pain  Goal status: MET  7. Patient will demonstrate pain free lumbar AROM to improve ability to complete reaching and bending tasks.  Baseline: see AROM above 02/13/24: mild pain with L rotation  Goal Status: IN PROGRESS   PLAN:  PT FREQUENCY: 2x/week  PT DURATION: 6 weeks  PLANNED INTERVENTIONS: [id number]- PT Re-evaluation, 97110-Therapeutic exercises, 97530- Therapeutic activity, 97112- Neuromuscular re-education, 97535- Self Care, 16109- Manual therapy, [id number]- Gait training, [phone]- Aquatic Therapy, [phone]- Electrical stimulation (unattended), [phone]- Electrical stimulation (manual), [id number]- Vasopneumatic device, [id number]- Ultrasound, [id number]- Traction (mechanical), [id number]- Ionotophoresis 4mg /ml Dexamethasone , Patient/Family education, Balance training, Stair training, Taping, Dry Needling, Joint mobilization, Spinal mobilization, Cryotherapy, and Moist heat  PLAN FOR NEXT SESSION: Progress HEP prn (back); core stabilization. Lifting mechanics (hip hinge in standing!); squatting/kneeling/lunge activities  Sims Duck, PTA 02/21/24 11:22 AM

## 2024-02-21 NOTE — Assessment & Plan Note (Signed)
 Danielle Braun returns, she is a very pleasant 63 year old female, she did have what appeared to be a new L3 vertebral compression fracture. She does have an osteoporosis provider, osteopenia on bone density test, they are planning Tymlos  for a year followed by an antiresorptive agent such as Prolia. I think this is appropriate, we did discuss the anabolic agents including Tymlos , Forteo, Evenity. She will think about whether she wants to continue with the daily Tymlos  injection or switch to a monthly Evenity shot. She either way does not have to see me again for this.

## 2024-02-21 NOTE — Assessment & Plan Note (Signed)
 Knee MRI did ultimately show some meniscal tearing, PT has been very beneficial and she no longer has discomfort.

## 2024-02-21 NOTE — Assessment & Plan Note (Signed)
 Send he was found to have an L3 vertebral compression fracture, she is currently getting treatment for her osteoporosis, she longer has pain along the L3 spinous process. She does have some lower lumbar pain likely related to existing lumbar spondylosis, this has responded dramatically well to formal physical therapy.

## 2024-02-24 ENCOUNTER — Other Ambulatory Visit: Payer: Self-pay

## 2024-02-24 ENCOUNTER — Ambulatory Visit: Payer: Self-pay

## 2024-02-24 DIAGNOSIS — R262 Difficulty in walking, not elsewhere classified: Secondary | ICD-10-CM | POA: Diagnosis not present

## 2024-02-24 DIAGNOSIS — M25561 Pain in right knee: Secondary | ICD-10-CM

## 2024-02-24 DIAGNOSIS — M25661 Stiffness of right knee, not elsewhere classified: Secondary | ICD-10-CM

## 2024-02-24 DIAGNOSIS — R2689 Other abnormalities of gait and mobility: Secondary | ICD-10-CM | POA: Diagnosis not present

## 2024-02-24 DIAGNOSIS — M5459 Other low back pain: Secondary | ICD-10-CM | POA: Diagnosis not present

## 2024-02-24 DIAGNOSIS — R252 Cramp and spasm: Secondary | ICD-10-CM

## 2024-02-24 DIAGNOSIS — M25562 Pain in left knee: Secondary | ICD-10-CM | POA: Diagnosis not present

## 2024-02-24 DIAGNOSIS — M6281 Muscle weakness (generalized): Secondary | ICD-10-CM

## 2024-02-24 NOTE — Therapy (Signed)
 OUTPATIENT PHYSICAL THERAPY TREATMENT    Patient Name: Danielle Braun MRN: [id number] DOB:Aug 09, 1961, 63 y.o., female Today's Date: 02/24/2024  END OF SESSION:  PT End of Session - 02/24/24 1017     Visit Number 21    Number of Visits 23    Date for PT Re-Evaluation 03/07/24    Authorization Type [employer]    PT Start Time 1020    PT Stop Time 1102    PT Time Calculation (min) 42 min    Activity Tolerance Patient tolerated treatment well    Behavior During Therapy WFL for tasks assessed/performed             Past Medical History:  Diagnosis Date   Acne    Arrhythmia    Bursitis, subacromial    Left   Endometriosis    GERD (gastroesophageal reflux disease)    Joint pain    Palpitations    Retinal tear    Sinusitis    SOB (shortness of breath)    Vitamin D  deficiency    Past Surgical History:  Procedure Laterality Date   ABDOMINAL HYSTERECTOMY     ABDOMINAL SURGERY     csection   DILATION AND CURETTAGE OF UTERUS     SHOULDER ARTHROSCOPY WITH SUBACROMIAL DECOMPRESSION Left 10/31/2017   Procedure: SHOULDER ARTHROSCOPY WITH BURSECTOMY AND SUBACROMIAL DECOMPRESSION;  Surgeon: Sammye Cristal, MD;  Location: [facility] OR;  Service: Orthopedics;  Laterality: Left;  Left shoulder arthroscopy bursectomy and subacromial decompression   TONSILLECTOMY     Patient Active Problem List   Diagnosis Date Noted   Lumbar spondylosis 01/21/2024   COVID 12/26/2023   Chronic cough 12/26/2023   Deviated nasal septum 12/26/2023   Hypertrophy of nasal turbinates 12/26/2023   Traumatic tear of meniscus of left knee 11/15/2023   Prediabetes 04/17/2023   History of colonic polyps 04/17/2023   Other fatigue 04/16/2023   SOB (shortness of breath) on exertion 04/16/2023   Health care maintenance 04/16/2023   Family history of diabetes mellitus 04/16/2023   Depression screening 04/16/2023   Generalized obesity 04/16/2023   BMI 30.0-30.9,adult 04/16/2023   Seasonal allergies  11/07/2022   Family history of osteoporosis 11/06/2022   Postmenopause 11/06/2022   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   Obesity (BMI 30-39.9) 01/26/2022   Patellofemoral pain syndrome of right knee 01/17/2022   Freiberg's disease, right 11/09/2021   Age-related osteoporosis without current pathological fracture 11/01/2021   Chronic sinusitis, unspecified 11/01/2021   Retinal tear of left eye 07/05/2021   History of laparoscopic-assisted vaginal hysterectomy 03/31/2021   History of sacrocolpopexy 03/31/2021   Blepharitis of upper and lower eyelids of both eyes 08/23/2020   Dry eye syndrome of both lacrimal glands 08/23/2020   Nuclear sclerotic cataract of both eyes 08/23/2020   Primary refluxing megaureter 03/02/2020   Sensation of feeling hot 01/18/2020   Chronic left shoulder pain 11/11/2018   Vitamin D  insufficiency 11/11/2018   Low bone mass 09/26/2017   Immune to measles 02/20/2016   Allergic rhinitis 02/06/2016   GERD (gastroesophageal reflux disease) 02/06/2016   Hyperlipemia 02/06/2016   Keratoconjunctivitis sicca of both eyes not specified as Sjogren's 02/06/2016   Myopia of both eyes with astigmatism 02/06/2016   Posterior vitreous detachment of left eye 02/06/2016   Premature atrial contraction 02/06/2016   History of right salpingo-oophorectomy 03/14/2010   Acute recurrent maxillary sinusitis 11/06/2008    PCP: Jayne Mews, MD  REFERRING PROVIDER: Gean Keels, MD  REFERRING DIAG:  [phone] (ICD-10-CM) -  Traumatic tear of meniscus of left knee, subsequent encounter M54.50 (ICD-10-CM) - Acute bilateral low back pain without sciatica      THERAPY DIAG:  Muscle weakness (generalized)  Other abnormalities of gait and mobility  Other low back pain  Acute pain of right knee  Stiffness of right knee, not elsewhere classified  Difficulty in walking, not elsewhere classified  Cramp and spasm  Rationale for Evaluation and Treatment:  Rehabilitation  ONSET DATE: ~3 weeks ago  SUBJECTIVE:   SUBJECTIVE STATEMENT: Patient reports she will start osteoporosis injections per MD. Patient states her back did well at work on Saturday, states she has been mindful of body mechanics and posture.   From eval: Pt states she's had issues with both legs in the last 2 years. Pt was having L hip and leg pain in December. Pt was walking and her knee gave way ~3 weeks ago. Pt states she has been able to do her 12 hour shifts and be careful. Pt reports pain was not so bad she needed an injection.   PERTINENT HISTORY: Frieburgs disease R foot (AVN of metatarsal), OA R knee   PAIN:  Are you having pain? Yes: NPRS scale: none currently; at worst 2/10  Pain location: left low back Pain description: ache Aggravating factors: end of 12 hour shift, twisting (knee); bending, valsalva (back)  Relieving factors: tylenol , ice and knee brace  PRECAUTIONS: None  RED FLAGS: None   WEIGHT BEARING RESTRICTIONS: No  FALLS:  Has patient fallen in last 6 months? No  LIVING ENVIRONMENT: Lives with: lives with their spouse Lives in: House/apartment Stairs: Yes: External: 2 steps; none Has following equipment at home: None  OCCUPATION: ICU nurse at [employer]  PATIENT GOALS: Return to normal activities (pt bikes and rows x20 min each)  NEXT MD VISIT: PRN  OBJECTIVE:  Note: Objective measures were completed at Evaluation unless otherwise noted.  DIAGNOSTIC FINDINGS: MRI 11/19/23 L knee IMPRESSION: 1. Complete tear at the root of the posterior horn of the medial meniscus, with near absence of meniscal tissue within the 3 mm of the posterior horn closest to the root. Associated mild extrusion of the body of the medial meniscus. 2. Tricompartmental cartilage degenerative changes, severe within the lateral patellofemoral cartilage. 3. Small joint effusion.  Lumbar X-ray  IMPRESSION: 1. Mild compression deformity of superior endplate of  L3 new compared to prior exam. Age indeterminate. 2. Mild degenerative joint changes of lumbar spine.  PATIENT SURVEYS:  Lower Extremity Functional Score: 47 / 80 = 58.8 % 01/22/24: 59/80.   EDEMA:  "A little edema"  MUSCLE LENGTH: Hamstrings: Left slightly tighter than Right Thomas test: Did not assess  PALPATION: Mostly tender about medial L knee joint line and along edema around medial patella  01/22/24: L spine hypomobility, pain at L2-L4 PAIVM; tautness bilateral lumbar paraspinals   LOWER EXTREMITY ROM:  Active ROM Right eval Left eval 12/31/23   Hip flexion     Hip extension     Hip abduction     Hip adduction     Hip internal rotation     Hip external rotation     Knee flexion 125 125 Lt: 140; Rt: 140   Knee extension 0 -5 Lt: 0; Rt: 0   Ankle dorsiflexion     Ankle plantarflexion     Ankle inversion     Ankle eversion      (Blank rows = not tested)  LOWER EXTREMITY MMT:  MMT Right eval Left eval  01/22/24 02/21/24  Hip flexion 5 4* 5 bilateral; pain in low back with RLE   Hip extension 4 3- 4 bilateral  4 bilateral   Hip abduction 5 3+ 4 bilateral  4+ bilateral  Hip adduction 5 3+ 4+ bilateral    Hip internal rotation      Hip external rotation      Knee flexion 5 5    Knee extension 5 5    Ankle dorsiflexion      Ankle plantarflexion      Ankle inversion      Ankle eversion       (Blank rows = not tested, * = pain) LUMBAR ROM:   Active  AROM  01/22/24 AROM  02/13/24  Flexion Full  Full no pain  Extension Full pain center of low back Full no pain  Right lateral flexion WNL pain Rt low back WNL No pain  Left lateral flexion WNL WNL no pain  Right rotation WNL low back pain WNL no pain  Left rotation WNL low back pain WNL mild pain L low back   (Blank rows = not tested)  LOWER EXTREMITY SPECIAL TESTS:  Hip special tests: Portia Brittle (FABER) test: negative and Craig's test: negative  FUNCTIONAL TESTS:  5x STS: 15.92 sec SLS: R 20 sec, L 19  sec  01/22/24: 5 x STS: 8.74 sec   GAIT: Distance walked: Into clinic Assistive device utilized: L knee brace Level of assistance: Complete Independence Comments: Mildly antalgic with L knee slightly kept in flexion   [facility] Adult PT Treatment:                                                DATE: 02/24/2024 Therapeutic Exercise: SKTC x 6 DKTC x6 Dead bug x10 Double leg stretch progression 2x10 Neuromuscular re-ed: Prone: Straight leg hip extension 2x15  Bent knee hip extension 2x10 HS curls + RTB 2x10 LTR + bent arm rotation in opp Shoulder extension to neutral + RTB (high setting) Modified lunge + straight arm pull down with GTB x10 each side Staggered stance paloff press + Blue TB x10 --> added quarter trunk rotation x10 Therapeutic Activity: Seated hip hinge --> standing hip hinge Squats + pillow in chair --> added cradling 5#KB x10 Pushing/pulling sled 4x20'    [facility] Adult PT Treatment:                                                DATE: 02/21/2024 Therapeutic Exercise: LTR Hip abd/ext MMT (se above) Standing: Bicep curls + 5#DBs Bicep curl + arnold press + 5#DBs Supine: Chest press + 5#DB Tricep press (skull crusher) + 3#DBs Neuromuscular re-ed: Prone: Straight leg hip extension x10  Bent knee hip extension x10 HS curls + RTB x10 Therapeutic Activity: Seated flat back hip hinge x10  Lifting technique + maintaining postural precautions with osteopenia Moving weighted bags from low to high shelf   [facility] Adult PT Treatment:                                                DATE: 02/18/24  Manual Therapy: STM Lt lumbar paraspinals Demo and returned demo of use of tennis ball for self-soft tissue mobilization Neuromuscular re-ed: Modified single leg RDL reaching to chair 2 x 10  Pallof press green band 2 x 10  Therapeutic Activity: Dead lift to 6 inch step; 5 lbs kettle bell 2 x 10   Self Care: Reviewed lifting mechanics Educated on avoid trunk flexion and  provided handouts regarding osteopenia  Access Code: [email or web address] URL: [email or web address] Date: 02/18/2024 Prepared by: Forrestine Ike          PATIENT EDUCATION:  Education details: HEP update  Person educated: Patient Education method: Explanation, demo, cues, handout Education comprehension: verbalized understanding, returned demo, cues   HOME EXERCISE PROGRAM: Access Code: [email or web address] URL: [email or web address] Date: 02/21/2024 Prepared by: Sims Duck  Exercises - Runner's Step Up/Down  - 1 x daily - 7 x weekly - 2 sets - 10 reps - Bridge with Hip Abduction and Resistance  - 1 x daily - 7 x weekly - 2 sets - 10 reps - Forward Step Down Touch with Heel  - 1 x daily - 7 x weekly - 2 sets - 10 reps - The Diver  - 1 x daily - 7 x weekly - 2 sets - 10 reps - Lunge Matrix on Slider  - 1 x daily - 7 x weekly - 2 sets - 10 reps - Standing 3-way Hip with Walker  - 1 x daily - 7 x weekly - 2 sets - 10 reps - Side Stepping with Resistance at Ankles  - 1 x daily - 7 x weekly - 2 sets - 10 reps - Backward Band Walks with Resistance at Thighs and Ankles  - 1 x daily - 7 x weekly - 2 sets - 10 reps - Forward Band Walks with Resistance at Thighs and Ankles  - 1 x daily - 7 x weekly - 2 sets - 10 reps - Standing Bicep Curls Supinated with Dumbbells  - 1 x daily - 7 x weekly - 3 sets - 10 reps - Shoulder Overhead Press in Abduction with Dumbbells  - 1 x daily - 7 x weekly - 3 sets - 10 reps - Supine Chest Press with Dumbbells  - 1 x daily - 7 x weekly - 3 sets - 10 reps - Supine Elbow Flexion Extension with Dumbbell  - 1 x daily - 7 x weekly - 3 sets - 10 reps   Access Code: [email or web address] URL: [email or web address] Date: 02/18/2024 Prepared by: Forrestine Ike  Exercises - Supine Lower Trunk Rotation  - 1 x daily - 7 x weekly - 1 sets - 10 reps - Supine Figure 4 Piriformis Stretch  - 1 x daily - 7 x weekly - 3 sets - 30  hold - Supine Pelvic Tilt with  Straight Leg Raise  - 1 x daily - 7 x weekly - 2 sets - 10 reps - Supine 90/90 Alternating Toe Touch  - 1 x daily - 7 x weekly - 2 sets - 10 reps - Sidelying Thoracic Rotation with Open Book  - 1 x daily - 7 x weekly - 2 sets - 10 reps - Supine Dead Bug with Leg Extension  - 1 x daily - 7 x weekly - 3 sets - 10 reps - Bird Dog  - 1 x daily - 7 x weekly - 3 sets - 10 reps - Standing Hip Hinge with Dowel  - 1 x daily - 7 x weekly - 2 sets -  10 reps   ASSESSMENT:  CLINICAL IMPRESSION:  Resisted squats and hip hinge mechanics progressed with cues for body mechanics/posture as needed. Abdominal bracing techniques incorporated throughout session for postural stabilization with work duties. Pushing/pulling sled incorporated to simulate navigating cart at work; discussed posture and abdominal bracing awareness when pulling sled/cart backwards.   RE-EVAL: Re-assessment of the Lt knee and re-evaluation performed for new referral for acute bilateral low back pain without sciatica. She has progressed well in regards to her Lt knee reporting occasional mild levels of pain with full ROM and strength about the Lt knee. She has met majority of goals related to the knee, with exception of hip strength goal. We discussed continuing with HEP 2-3 x weekly for maintenance/independent progression and that we will be focusing our treatment on the acute low back pain which began at the beginning of March from repetitive coughing due to COVID. Upon assessment she is noted to have good lumbar AROM, though experiences pain with extension, rotation, and Rt lateral flexion. She has tautness about lumbar paraspinals and L-spine hypomobility with pain noted during PAIVM L2-L4. She has hip and core weakness and reports increased pain with functional bending and pushing tasks at work. She will benefit from skilled PT to address the above stated deficits in order to optimize her function and assist in overall pain reduction.     OBJECTIVE IMPAIRMENTS: Abnormal gait, decreased activity tolerance, decreased endurance, decreased mobility, difficulty walking, decreased ROM, decreased strength, increased edema, increased fascial restrictions, improper body mechanics, and pain.     GOALS: Goals reviewed with patient? Yes  SHORT TERM GOALS: Target date: 12/26/2023  Pt will be ind with initial HEP Baseline: Goal status: MET  2.  Pt will demo L = R knee ROM Baseline:  Goal status: MET   LONG TERM GOALS: Target date: 03/07/24  Pt will be ind with management and progression of HEP for full return to her normal gym activities (I.e. rowing and biking) Baseline:  02/13/24: 20 min x 4 days/week Goal status: MET   2.  Pt will report >/=75% improvement in her overall knee pain with activity Baseline:  01/22/24: reports 75% improvement  Goal status: MET  3.  Pt will demo improved 5x STS to </=10 sec for increased functional LE strength Baseline:  Goal status: MET  4.  Pt will demo at least 4+/5 hip abductor and extensor for improved knee and lumbopelvic stability.  Baseline:  02/21/24: hip extension 4/5 (bilateral) Goal status: IN PROGRESS   5.  Pt will have improved LEFS to >/=56/80 to demo [facility] Baseline:  Goal status: MET  6. Patient will be able to sustained bent over lunge positioning for at least 30 seconds without pain to improve tolerance to work specific activity.   Baseline: 6/10 pain with bedside work   02/13/24: no pain  Goal status: MET  7. Patient will demonstrate pain free lumbar AROM to improve ability to complete reaching and bending tasks.   Baseline: see AROM above 02/13/24: mild pain with L rotation  Goal Status: IN PROGRESS   PLAN:  PT FREQUENCY: 2x/week  PT DURATION: 6 weeks  PLANNED INTERVENTIONS: 97164- PT Re-evaluation, 97110-Therapeutic exercises, 97530- Therapeutic activity, 97112- Neuromuscular re-education, 97535- Self Care, 16109- Manual therapy, [phone]- Gait training,  [phone]- Aquatic Therapy, [phone]- Electrical stimulation (unattended), [phone]- Electrical stimulation (manual), [id number]- Vasopneumatic device, [id number]- Ultrasound, [id number]- Traction (mechanical), [id number]- Ionotophoresis 4mg /ml Dexamethasone , Patient/Family education, Balance training, Stair training, Taping, Dry Needling, Joint mobilization, Spinal mobilization, Cryotherapy, and  Moist heat  PLAN FOR NEXT SESSION: Progress HEP prn (back); core stabilization. Lifting mechanics and work-related tasks; squatting/kneeling/lunge activities  Sims Duck, PTA 02/24/24 11:03 AM

## 2024-02-24 NOTE — Progress Notes (Signed)
 Specialty Pharmacy Initial Fill Coordination Note  Danielle Braun is a 63 y.o. female contacted today regarding initial fill of specialty medication(s) Abaloparatide  (Tymlos )   Patient requested [facility] at [facility] at [place] date: 02/26/24   Medication will be filled on 4/29.   Patient is aware of $0 copayment.

## 2024-02-25 ENCOUNTER — Other Ambulatory Visit: Payer: Self-pay

## 2024-02-26 ENCOUNTER — Other Ambulatory Visit (HOSPITAL_COMMUNITY): Payer: Self-pay

## 2024-02-27 NOTE — Therapy (Signed)
 OUTPATIENT PHYSICAL THERAPY TREATMENT RE-EVALUATION     Patient Name: Danielle Braun MRN: [id number] DOB:[birth year], 63 y.o., female Today's Date: 02/28/2024  END OF SESSION:  PT End of Session - 02/28/24 1017     Visit Number 22    Number of Visits 26    Date for PT Re-Evaluation 03/28/24    Authorization Type [employer]    PT Start Time 1017    PT Stop Time 1055    PT Time Calculation (min) 38 min    Activity Tolerance Patient tolerated treatment well    Behavior During Therapy WFL for tasks assessed/performed              Past Medical History:  Diagnosis Date   Acne    Arrhythmia    Bursitis, subacromial    Left   Endometriosis    GERD (gastroesophageal reflux disease)    Joint pain    Palpitations    Retinal tear    Sinusitis    SOB (shortness of breath)    Vitamin D  deficiency    Past Surgical History:  Procedure Laterality Date   ABDOMINAL HYSTERECTOMY     ABDOMINAL SURGERY     csection   DILATION AND CURETTAGE OF UTERUS     SHOULDER ARTHROSCOPY WITH SUBACROMIAL DECOMPRESSION Left 10/31/2017   Procedure: SHOULDER ARTHROSCOPY WITH BURSECTOMY AND SUBACROMIAL DECOMPRESSION;  Surgeon: Sammye Cristal, MD;  Location: [facility] OR;  Service: Orthopedics;  Laterality: Left;  Left shoulder arthroscopy bursectomy and subacromial decompression   TONSILLECTOMY     Patient Active Problem List   Diagnosis Date Noted   Lumbar spondylosis 01/21/2024   COVID 12/26/2023   Chronic cough 12/26/2023   Deviated nasal septum 12/26/2023   Hypertrophy of nasal turbinates 12/26/2023   Traumatic tear of meniscus of left knee 11/15/2023   Prediabetes 04/17/2023   History of colonic polyps 04/17/2023   Other fatigue 04/16/2023   SOB (shortness of breath) on exertion 04/16/2023   Health care maintenance 04/16/2023   Family history of diabetes mellitus 04/16/2023   Depression screening 04/16/2023   Generalized obesity 04/16/2023   BMI 30.0-30.9,adult 04/16/2023   Seasonal  allergies 11/07/2022   Family history of osteoporosis 11/06/2022   Postmenopause 11/06/2022   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   Obesity (BMI 30-39.9) 01/26/2022   Patellofemoral pain syndrome of right knee 01/17/2022   Freiberg's disease, right 11/09/2021   Age-related osteoporosis without current pathological fracture 11/01/2021   Chronic sinusitis, unspecified 11/01/2021   Retinal tear of left eye 07/05/2021   History of laparoscopic-assisted vaginal hysterectomy 03/31/2021   History of sacrocolpopexy 03/31/2021   Blepharitis of upper and lower eyelids of both eyes 08/23/2020   Dry eye syndrome of both lacrimal glands 08/23/2020   Nuclear sclerotic cataract of both eyes 08/23/2020   Primary refluxing megaureter 03/02/2020   Sensation of feeling hot 01/18/2020   Chronic left shoulder pain 11/11/2018   Vitamin D  insufficiency 11/11/2018   Low bone mass 09/26/2017   Immune to measles 02/20/2016   Allergic rhinitis 02/06/2016   GERD (gastroesophageal reflux disease) 02/06/2016   Hyperlipemia 02/06/2016   Keratoconjunctivitis sicca of both eyes not specified as Sjogren's 02/06/2016   Myopia of both eyes with astigmatism 02/06/2016   Posterior vitreous detachment of left eye 02/06/2016   Premature atrial contraction 02/06/2016   History of right salpingo-oophorectomy 03/14/2010   Acute recurrent maxillary sinusitis 11/06/2008    PCP: Jayne Mews, MD  REFERRING PROVIDER: Gean Keels, MD  REFERRING DIAG:  Z61.096E (ICD-10-CM) - Traumatic tear of meniscus of left knee, subsequent encounter M54.50 (ICD-10-CM) - Acute bilateral low back pain without sciatica  M81.0 (ICD-10-CM) - Age-related osteoporosis without current pathological fracture     THERAPY DIAG:  Muscle weakness (generalized)  Other abnormalities of gait and mobility  Other low back pain  Age-related osteoporosis without current pathological fracture  Acute pain of left  knee  Rationale for Evaluation and Treatment: Rehabilitation  ONSET DATE: ~3 weeks ago  SUBJECTIVE:   SUBJECTIVE STATEMENT: Patient with new referral for age-related osteoporosis without current pathological fracture. Overall the back is doing pretty good. Sometimes gets little twinges of pain every now and then. Work is going fine. The knee is doing well and just little twinges every now and then. Patient is going to start tymlos  for osteoporosis tomorrow. She is interested in learning more strengthening and balance stuff that she can continue to do on her own.   From eval: Pt states she's had issues with both legs in the last 2 years. Pt was having L hip and leg pain in December. Pt was walking and her knee gave way ~3 weeks ago. Pt states she has been able to do her 12 hour shifts and be careful. Pt reports pain was not so bad she needed an injection.   PERTINENT HISTORY: Frieburgs disease R foot (AVN of metatarsal), OA R knee   PAIN:  Are you having pain? Yes: NPRS scale: 1/10  Pain location: left knee Pain description: twinge Aggravating factors: end of 12 hour shift, twisting (knee) Relieving factors: tylenol , ice and knee brace, rest  PRECAUTIONS: None  RED FLAGS: None   WEIGHT BEARING RESTRICTIONS: No  FALLS:  Has patient fallen in last 6 months? No  LIVING ENVIRONMENT: Lives with: lives with their spouse Lives in: House/apartment Stairs: Yes: External: 2 steps; none Has following equipment at home: None  OCCUPATION: ICU nurse at [employer]  PATIENT GOALS: Return to normal activities (pt bikes and rows x20 min each)  NEXT MD VISIT: PRN  OBJECTIVE:  Note: Objective measures were completed at Evaluation unless otherwise noted.  DIAGNOSTIC FINDINGS: MRI 11/19/23 L knee IMPRESSION: 1. Complete tear at the root of the posterior horn of the medial meniscus, with near absence of meniscal tissue within the 3 mm of the posterior horn closest to the root. Associated  mild extrusion of the body of the medial meniscus. 2. Tricompartmental cartilage degenerative changes, severe within the lateral patellofemoral cartilage. 3. Small joint effusion.  Lumbar X-ray  IMPRESSION: 1. Mild compression deformity of superior endplate of L3 new compared to prior exam. Age indeterminate. 2. Mild degenerative joint changes of lumbar spine.  PATIENT SURVEYS:  Lower Extremity Functional Score: 47 / 80 = 58.8 % 01/22/24: 59/80.   EDEMA:  "A little edema"  MUSCLE LENGTH: Hamstrings: Left slightly tighter than Right Thomas test: Did not assess  PALPATION: Mostly tender about medial L knee joint line and along edema around medial patella  01/22/24: L spine hypomobility, pain at L2-L4 PAIVM; tautness bilateral lumbar paraspinals   LOWER EXTREMITY ROM:  Active ROM Right eval Left eval 12/31/23   Hip flexion     Hip extension     Hip abduction     Hip adduction     Hip internal rotation     Hip external rotation     Knee flexion 125 125 Lt: 140; Rt: 140   Knee extension 0 -5 Lt: 0; Rt: 0   Ankle dorsiflexion  Ankle plantarflexion     Ankle inversion     Ankle eversion      (Blank rows = not tested)  LOWER EXTREMITY MMT:  MMT Right eval Left eval 01/22/24 02/21/24 02/28/24  Hip flexion 5 4* 5 bilateral; pain in low back with RLE  5 bilateral   Hip extension 4 3- 4 bilateral  4 bilateral  4 bilateral   Hip abduction 5 3+ 4 bilateral  4+ bilateral 5: Lt; Rt: 4+  Hip adduction 5 3+ 4+ bilateral     Hip internal rotation       Hip external rotation       Knee flexion 5 5   5  bilateral   Knee extension 5 5   5  bilateral   Ankle dorsiflexion       Ankle plantarflexion       Ankle inversion       Ankle eversion        (Blank rows = not tested, * = pain) LUMBAR ROM:   Active  AROM  01/22/24 AROM  02/13/24 02/28/24  Flexion Full  Full no pain WFL  Extension Full pain center of low back Full no pain WFL  Right lateral flexion WNL pain Rt low back  WNL No pain WFL  Left lateral flexion WNL WNL no pain WFL  Right rotation WNL low back pain WNL no pain WFL   Left rotation WNL low back pain WNL mild pain L low back WFL   (Blank rows = not tested) UPPER EXTREMITY MMT:  MMT Right 02/28/24 Left 02/28/24  Shoulder flexion 5 5  Shoulder extension    Shoulder abduction 5 5  Shoulder adduction    Shoulder extension    Shoulder internal rotation 5 5  Shoulder external rotation 5 5  Middle trapezius    Lower trapezius    Elbow flexion 5 5  Elbow extension 5 5  Wrist flexion    Wrist extension    Wrist ulnar deviation    Wrist radial deviation    Wrist pronation    Wrist supination    Grip strength     (Blank rows = not tested)  LOWER EXTREMITY SPECIAL TESTS:  Hip special tests: Portia Brittle (FABER) test: negative and Craig's test: negative  FUNCTIONAL TESTS:  5x STS: 15.92 sec SLS: R 20 sec, L 19 sec  01/22/24: 5 x STS: 8.74 sec . 02/28/24: lifting 15 lb box- increased thoracic flexion    GAIT: Distance walked: Into clinic Assistive device utilized: L knee brace Level of assistance: Complete Independence Comments: Mildly antalgic with L knee slightly kept in flexion  [facility] Adult PT Treatment:                                                DATE: 02/28/24 Therapeutic Exercise: Revised current knee and back HEP and added specifics for osteoporosis strengthening performing as needed   Self Care: Discussed positions to avoid as it relates to osteoporosis  Lifting mechanics   RE-EVAL  [facility] Adult PT Treatment:                                                DATE: 02/24/2024 Therapeutic Exercise: SKTC x 6 DKTC x6 Dead  bug x10 Double leg stretch progression 2x10 Neuromuscular re-ed: Prone: Straight leg hip extension 2x15  Bent knee hip extension 2x10 HS curls + RTB 2x10 LTR + bent arm rotation in opp Shoulder extension to neutral + RTB (high setting) Modified lunge + straight arm pull down with GTB x10 each side Staggered  stance paloff press + Blue TB x10 --> added quarter trunk rotation x10 Therapeutic Activity: Seated hip hinge --> standing hip hinge Squats + pillow in chair --> added cradling 5#KB x10 Pushing/pulling sled 4x20'    [facility] Adult PT Treatment:                                                DATE: 02/21/2024 Therapeutic Exercise: LTR Hip abd/ext MMT (se above) Standing: Bicep curls + 5#DBs Bicep curl + arnold press + 5#DBs Supine: Chest press + 5#DB Tricep press (skull crusher) + 3#DBs Neuromuscular re-ed: Prone: Straight leg hip extension x10  Bent knee hip extension x10 HS curls + RTB x10 Therapeutic Activity: Seated flat back hip hinge x10  Lifting technique + maintaining postural precautions with osteopenia Moving weighted bags from low to high shelf          PATIENT EDUCATION:  Education details: HEP update  Person educated: Patient Education method: Explanation, demo, cues, handout Education comprehension: verbalized understanding, returned demo, cues   HOME EXERCISE PROGRAM:   Access Code: [email or web address] URL: [email or web address] Date: 02/28/2024 Prepared by: Forrestine Ike  Exercises - Supine Lower Trunk Rotation  - 1 x daily - 7 x weekly - 1 sets - 10 reps - Supine Figure 4 Piriformis Stretch  - 1 x daily - 7 x weekly - 3 sets - 30  hold - Sidelying Thoracic Rotation with Open Book  - 1 x daily - 7 x weekly - 2 sets - 10 reps - Supine Dead Bug with Leg Extension  - 1 x daily - 3 x weekly - 3 sets - 10 reps - Bird Dog  - 1 x daily - 3 x weekly - 3 sets - 10 reps - Standing Hip Hinge with Dowel  - 1 x daily - 3 x weekly - 2 sets - 10 reps - Standing Bent Over Single Arm Shoulder Row  - 1 x daily - 3 x weekly - 2 sets - 10 reps - Prone Alternating Arm and Leg Lifts  - 1 x daily - 3 x weekly - 2 sets - 10 reps - The Diver  - 1 x daily - 3 x weekly - 2 sets - 10 reps - Lunge Matrix on Slider  - 1 x daily - 3 x weekly - 2 sets - 10 reps - Side Stepping with  Resistance at Ankles  - 1 x daily - 3 x weekly - 2 sets - 10 reps - Band Walks  - 1 x daily - 3 x weekly - 2 sets - 10 reps - Standing Bicep Curls Supinated with Dumbbells  - 1 x daily - 3 x weekly - 2 sets - 10 reps - Supine Chest Press with Dumbbells  - 1 x daily - 3 x weekly - 2 sets - 10 reps - Standing Bent Over Triceps Extension  - 1 x daily - 3 x weekly - 2 sets - 10 reps   ASSESSMENT:  CLINICAL IMPRESSION:  Patient with new referral for age-related  osteoporosis without current pathological fracture. Re-eval was performed today with patient noted to have mild gluteal weakness and aberrant lifting mechanics. Overall she has responded well in PT for her acute Lt knee pain and low back pain reporting minimal occasional pain. Today's session focused on updating HEP to include upper and lower extremity resisted strengthening exercises to focus on developing home program that she can continue to help manage her osteoporosis. She will benefit from skilled PT 1 x weekly for 4 weeks to address lingering weakness, improve body mechanics with functional tasks, and train in home program that she can continue independently.   RE-EVAL: Re-assessment of the Lt knee and re-evaluation performed for new referral for acute bilateral low back pain without sciatica. She has progressed well in regards to her Lt knee reporting occasional mild levels of pain with full ROM and strength about the Lt knee. She has met majority of goals related to the knee, with exception of hip strength goal. We discussed continuing with HEP 2-3 x weekly for maintenance/independent progression and that we will be focusing our treatment on the acute low back pain which began at the beginning of March from repetitive coughing due to COVID. Upon assessment she is noted to have good lumbar AROM, though experiences pain with extension, rotation, and Rt lateral flexion. She has tautness about lumbar paraspinals and L-spine hypomobility with pain  noted during PAIVM L2-L4. She has hip and core weakness and reports increased pain with functional bending and pushing tasks at work. She will benefit from skilled PT to address the above stated deficits in order to optimize her function and assist in overall pain reduction.    OBJECTIVE IMPAIRMENTS: Abnormal gait, decreased activity tolerance, decreased endurance, decreased mobility, difficulty walking, decreased ROM, decreased strength, increased edema, increased fascial restrictions, improper body mechanics, and pain.     GOALS: Goals reviewed with patient? Yes  SHORT TERM GOALS: Target date: 12/26/2023  Pt will be ind with initial HEP Baseline: Goal status: MET  2.  Pt will demo L = R knee ROM Baseline:  Goal status: MET   LONG TERM GOALS: Target date: 03/28/24  Pt will be ind with management and progression of HEP for full return to her normal gym activities (I.e. rowing and biking) Baseline:  02/13/24: 20 min x 4 days/week Goal status: MET   2.  Pt will report >/=75% improvement in her overall knee pain with activity Baseline:  01/22/24: reports 75% improvement  Goal status: MET  3.  Pt will demo improved 5x STS to </=10 sec for increased functional LE strength Baseline:  Goal status: MET  4.  Pt will demo at least 4+/5 hip abductor and extensor for improved knee and lumbopelvic stability.  Baseline:  02/21/24: hip extension 4/5 (bilateral) Goal status: IN PROGRESS   5.  Pt will have improved LEFS to >/=56/80 to demo MCID Baseline:  Goal status: MET  6. Patient will be able to sustained bent over lunge positioning for at least 30 seconds without pain to improve tolerance to work specific activity.   Baseline: 6/10 pain with bedside work   02/13/24: no pain  Goal status: MET  7. Patient will demonstrate pain free lumbar AROM to improve ability to complete reaching and bending tasks.   Baseline: see AROM above 02/13/24: mild pain with L rotation  Goal Status:  MET  8. Patient will demonstrate proper lifting mechanics of at least 15 lbs from floor to waist height to reduce stress on her back.  Baseline: see above  Goal status: NEW  9. Patient will be independent with advanced home program as it relates to osteoporosis management to progress/maintain current level of function.   Baseline: HEP issued  Goal status: NEW   PLAN:  PT FREQUENCY: 1x/week  PT DURATION: 4 weeks  PLANNED INTERVENTIONS: 97164- PT Re-evaluation, 97110-Therapeutic exercises, 97530- Therapeutic activity, [id number]- Neuromuscular re-education, 97535- Self Care, [id number]- Manual therapy, [id number]- Gait training, [phone]- Aquatic Therapy, [phone]- Electrical stimulation (unattended), [phone]- Electrical stimulation (manual), [id number]- Vasopneumatic device, [id number]- Ultrasound, [id number]- Traction (mechanical), [id number]- Ionotophoresis 4mg /ml Dexamethasone , Patient/Family education, Balance training, Stair training, Taping, Dry Needling, Joint mobilization, Spinal mobilization, Cryotherapy, and Moist heat  PLAN FOR NEXT SESSION: glute strengthening; functional strengthening incorporating upper and lower extremity strengthening.   Fortunata Betty, PT, DPT, ATC 02/28/24 12:18 PM

## 2024-02-28 ENCOUNTER — Ambulatory Visit: Payer: Self-pay | Attending: Sports Medicine

## 2024-02-28 ENCOUNTER — Ambulatory Visit: Admitting: Sports Medicine

## 2024-02-28 DIAGNOSIS — M6281 Muscle weakness (generalized): Secondary | ICD-10-CM | POA: Insufficient documentation

## 2024-02-28 DIAGNOSIS — M5459 Other low back pain: Secondary | ICD-10-CM | POA: Diagnosis present

## 2024-02-28 DIAGNOSIS — M810 Age-related osteoporosis without current pathological fracture: Secondary | ICD-10-CM | POA: Insufficient documentation

## 2024-02-28 DIAGNOSIS — R2689 Other abnormalities of gait and mobility: Secondary | ICD-10-CM | POA: Insufficient documentation

## 2024-02-28 DIAGNOSIS — M25562 Pain in left knee: Secondary | ICD-10-CM | POA: Diagnosis present

## 2024-03-02 ENCOUNTER — Encounter: Payer: Self-pay | Admitting: Nurse Practitioner

## 2024-03-02 ENCOUNTER — Ambulatory Visit: Admitting: Nurse Practitioner

## 2024-03-02 VITALS — BP 118/75 | HR 63 | Temp 97.7°F | Ht 65.0 in | Wt 166.0 lb

## 2024-03-02 DIAGNOSIS — Z6827 Body mass index (BMI) 27.0-27.9, adult: Secondary | ICD-10-CM | POA: Diagnosis not present

## 2024-03-02 DIAGNOSIS — E663 Overweight: Secondary | ICD-10-CM | POA: Diagnosis not present

## 2024-03-02 DIAGNOSIS — M810 Age-related osteoporosis without current pathological fracture: Secondary | ICD-10-CM

## 2024-03-02 NOTE — Progress Notes (Signed)
 Office: [phone]  /  Fax: [phone]  WEIGHT SUMMARY AND BIOMETRICS  Weight Lost Since Last Visit: 7lb  Weight Gained Since Last Visit: 0lb   Vitals Temp: 97.7 F (36.5 C) BP: 118/75 Pulse Rate: 63 SpO2: 99 %   Anthropometric Measurements Height: 5\' 5"  (1.651 m) Weight: 166 lb (75.3 kg) BMI (Calculated): 27.62 Weight at Last Visit: 173lb Weight Lost Since Last Visit: 7lb Weight Gained Since Last Visit: 0lb Starting Weight: 188lb Total Weight Loss (lbs): 22 lb (9.979 kg)   Body Composition  Body Fat %: 40.5 % Fat Mass (lbs): 67.4 lbs Muscle Mass (lbs): 93.8 lbs Total Body Water  (lbs): 68.4 lbs Visceral Fat Rating : 10   Other Clinical Data Fasting: No Labs: No Today's Visit #: 12 Starting Date: 04/16/23     HPI  Chief Complaint: OBESITY  Danielle Braun is here to discuss her progress with her obesity treatment plan. She is on the the Category 2 Plan and states she is following her eating plan approximately 70 % of the time. She states she is exercising 25 minutes 3-4 days per week.   Interval History:  Since last office visit she has lost 7 pounds.  She had a "GI bug" and has struggled with back pain since her last visit. She struggled with nausea and hasn't been able to follow the meal plan.  Feels this is why she lost 7 lbs.  She saw Dr. Elva Hamburger last on 02/21/24.  She recently finished PT for her back.  X rays on 01/30/24 showed  "Mild compression deformity of superior endplate of L3 new compared to prior exam. Age indeterminate".  She saw ortho last on 02/10/24.  She started Tymlos  on 02/29/24. Last DEXA was 04/23/23.  She plans to have a repeat DEXA in July. Last Vit D was 47.4 on 01/14/24. She is taking Vit D 2250 international units  and calcium daily. She is slow to getting back to rowing and cycling.  She is doing 15 mins 3 days per week.  She started walking yesterday.  She is going to OP PT for the next 4 weeks.      We have discussed her continued muscle mass  loss.  She is currently averaging around 79-103 grams of protein daily.    Pharmacotherapy for weight loss: She is not currently taking medications  for medical weight loss.    Previous pharmacotherapy for medical weight loss:  none   Bariatric surgery:  Has not had bariatric surgery   PHYSICAL EXAM:  Blood pressure 118/75, pulse 63, temperature 97.7 F (36.5 C), height 5\' 5"  (1.651 m), weight 166 lb (75.3 kg), SpO2 99%. Body mass index is 27.62 kg/m.  General: She is overweight, cooperative, alert, well developed, and in no acute distress. PSYCH: Has normal mood, affect and thought process.   Extremities: No edema.  Neurologic: No gross sensory or motor deficits. No tremors or fasciculations noted.    DIAGNOSTIC DATA REVIEWED:  BMET    Component Value Date/Time   NA 141 12/04/2023 0826   K 4.9 12/04/2023 0826   CL 104 12/04/2023 0826   CO2 25 12/04/2023 0826   GLUCOSE 86 12/04/2023 0826   GLUCOSE 90 10/17/2017 0920   BUN 26 12/04/2023 0826   CREATININE 0.71 12/04/2023 0826   CALCIUM 9.2 12/04/2023 0826   GFRNONAA >60 10/17/2017 0920   GFRAA >60 10/17/2017 0920   Lab Results  Component Value Date   HGBA1C 5.7 (H) 04/16/2023   Lab Results  Component  Value Date   INSULIN  9.7 04/16/2023   Lab Results  Component Value Date   TSH 1.680 04/16/2023   CBC    Component Value Date/Time   WBC 5.2 12/04/2023 0826   WBC 4.3 10/17/2017 0920   RBC 4.10 12/04/2023 0826   RBC 4.26 10/17/2017 0920   HGB 12.9 12/04/2023 0826   HCT 39.6 12/04/2023 0826   PLT 272 12/04/2023 0826   MCV 97 12/04/2023 0826   MCH 31.5 12/04/2023 0826   MCH 31.7 10/17/2017 0920   MCHC 32.6 12/04/2023 0826   MCHC 33.7 10/17/2017 0920   RDW 12.5 12/04/2023 0826   Iron Studies No results found for: "IRON", "TIBC", "FERRITIN", "IRONPCTSAT" Lipid Panel     Component Value Date/Time   CHOL 190 12/04/2023 0826   TRIG 77 12/04/2023 0826   HDL 64 12/04/2023 0826   LDLCALC 112 (H) 12/04/2023  0826   Hepatic Function Panel     Component Value Date/Time   PROT 6.5 12/04/2023 0826   ALBUMIN 4.1 12/04/2023 0826   AST 19 12/04/2023 0826   ALT 14 12/04/2023 0826   ALKPHOS 72 12/04/2023 0826   BILITOT <0.2 12/04/2023 0826      Component Value Date/Time   TSH 1.680 04/16/2023 0856   Nutritional Lab Results  Component Value Date   VD25OH 51.7 04/16/2023     ASSESSMENT AND PLAN  TREATMENT PLAN FOR OBESITY:  Recommended Dietary Goals  Danielle Braun is currently in the action stage of change. As such, her goal is to continue weight management plan. She has agreed to track and will review at next visit.  Will add a protein shake daily.  Aim for 1500 calories and > 100 grams of protein daily.  We discussed extensively the importance of meeting protein goals and patient verbalizes understanding.    Behavioral Intervention  We discussed the following Behavioral Modification Strategies today: increasing lean protein intake to established goals, decreasing simple carbohydrates , increasing vegetables, increasing fiber rich foods, increasing water  intake , work on meal planning and preparation, work on tracking and journaling calories using tracking application, and continue to work on maintaining a reduced calorie state, getting the recommended amount of protein, incorporating whole foods, making healthy choices, staying well hydrated and practicing mindfulness when eating..  Additional resources provided today: NA  Recommended Physical Activity Goals  Danielle Braun has been advised to work up to 150 minutes of moderate intensity aerobic activity a week and strengthening exercises 2-3 times per week for cardiovascular health, weight loss maintenance and preservation of muscle mass.   She has agreed to Think about enjoyable ways to increase daily physical activity and overcoming barriers to exercise, Increase physical activity in their day and reduce sedentary time (increase NEAT)., Start  strengthening exercises with a goal of 2-3 sessions a week , and Start aerobic activity with a goal of 150 minutes a week at moderate intensity.   Continue PT and increase exercise per their recommendations.     ASSOCIATED CONDITIONS ADDRESSED TODAY  Action/Plan  Age-related osteoporosis without current pathological fracture Continue to follow up with Internal med, PT and take meds as directed.  Needs to take Vit D and calcium as directed and add in resistance training per PT recommendations.    Overweight (BMI 25.0-29.9)  BMI 27.0-27.9,adult     We had a long discussion today about OP, muscle mass loss and exercising.     Return in about 4 weeks (around 03/30/2024).Danielle Braun She was informed of the importance of frequent  follow up visits to maximize her success with intensive lifestyle modifications for her multiple health conditions.   ATTESTASTION STATEMENTS:  Reviewed by clinician on day of visit: allergies, medications, problem list, medical history, surgical history, family history, social history, and previous encounter notes.   Time spent on visit including pre-visit chart review and post-visit care and charting was 30 minutes concerning protein intake, OP, muscle mass loss and exercising.    Crist Dominion. Lorre Opdahl FNP-C

## 2024-03-03 ENCOUNTER — Ambulatory Visit: Admitting: Sports Medicine

## 2024-03-05 ENCOUNTER — Ambulatory Visit: Payer: Self-pay

## 2024-03-05 DIAGNOSIS — M25562 Pain in left knee: Secondary | ICD-10-CM

## 2024-03-05 DIAGNOSIS — M6281 Muscle weakness (generalized): Secondary | ICD-10-CM

## 2024-03-05 DIAGNOSIS — M5459 Other low back pain: Secondary | ICD-10-CM

## 2024-03-05 DIAGNOSIS — R2689 Other abnormalities of gait and mobility: Secondary | ICD-10-CM

## 2024-03-05 DIAGNOSIS — M810 Age-related osteoporosis without current pathological fracture: Secondary | ICD-10-CM

## 2024-03-05 NOTE — Therapy (Signed)
 OUTPATIENT PHYSICAL THERAPY TREATMENT     Patient Name: Danielle Braun MRN: [id number] DOB:May 18, 1961, 63 y.o., female Today's Date: 03/05/2024  END OF SESSION:  PT End of Session - 03/05/24 0843     Visit Number 23    Number of Visits 26    Date for PT Re-Evaluation 03/28/24    Authorization Type [employer]    PT Start Time [phone]    PT Stop Time 0929    PT Time Calculation (min) 46 min    Activity Tolerance Patient tolerated treatment well    Behavior During Therapy WFL for tasks assessed/performed               Past Medical History:  Diagnosis Date   Acne    Arrhythmia    Bursitis, subacromial    Left   Endometriosis    GERD (gastroesophageal reflux disease)    Joint pain    Palpitations    Retinal tear    Sinusitis    SOB (shortness of breath)    Vitamin D  deficiency    Past Surgical History:  Procedure Laterality Date   ABDOMINAL HYSTERECTOMY     ABDOMINAL SURGERY     csection   DILATION AND CURETTAGE OF UTERUS     SHOULDER ARTHROSCOPY WITH SUBACROMIAL DECOMPRESSION Left 10/31/2017   Procedure: SHOULDER ARTHROSCOPY WITH BURSECTOMY AND SUBACROMIAL DECOMPRESSION;  Surgeon: Sammye Cristal, MD;  Location: [facility] OR;  Service: Orthopedics;  Laterality: Left;  Left shoulder arthroscopy bursectomy and subacromial decompression   TONSILLECTOMY     Patient Active Problem List   Diagnosis Date Noted   Lumbar spondylosis 01/21/2024   COVID 12/26/2023   Chronic cough 12/26/2023   Deviated nasal septum 12/26/2023   Hypertrophy of nasal turbinates 12/26/2023   Traumatic tear of meniscus of left knee 11/15/2023   Prediabetes 04/17/2023   History of colonic polyps 04/17/2023   Other fatigue 04/16/2023   SOB (shortness of breath) on exertion 04/16/2023   Health care maintenance 04/16/2023   Family history of diabetes mellitus 04/16/2023   Depression screening 04/16/2023   Generalized obesity 04/16/2023   BMI 30.0-30.9,adult 04/16/2023   Seasonal allergies  11/07/2022   Family history of osteoporosis 11/06/2022   Postmenopause 11/06/2022   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   Obesity (BMI 30-39.9) 01/26/2022   Patellofemoral pain syndrome of right knee 01/17/2022   Freiberg's disease, right 11/09/2021   Age-related osteoporosis without current pathological fracture 11/01/2021   Chronic sinusitis, unspecified 11/01/2021   Retinal tear of left eye 07/05/2021   History of laparoscopic-assisted vaginal hysterectomy 03/31/2021   History of sacrocolpopexy 03/31/2021   Blepharitis of upper and lower eyelids of both eyes 08/23/2020   Dry eye syndrome of both lacrimal glands 08/23/2020   Nuclear sclerotic cataract of both eyes 08/23/2020   Primary refluxing megaureter 03/02/2020   Sensation of feeling hot 01/18/2020   Chronic left shoulder pain 11/11/2018   Vitamin D  insufficiency 11/11/2018   Low bone mass 09/26/2017   Immune to measles 02/20/2016   Allergic rhinitis 02/06/2016   GERD (gastroesophageal reflux disease) 02/06/2016   Hyperlipemia 02/06/2016   Keratoconjunctivitis sicca of both eyes not specified as Sjogren's 02/06/2016   Myopia of both eyes with astigmatism 02/06/2016   Posterior vitreous detachment of left eye 02/06/2016   Premature atrial contraction 02/06/2016   History of right salpingo-oophorectomy 03/14/2010   Acute recurrent maxillary sinusitis 11/06/2008    PCP: Jayne Mews, MD  REFERRING PROVIDER: Gean Keels, MD  REFERRING DIAG:  X91.478G (ICD-10-CM) - Traumatic tear of meniscus of left knee, subsequent encounter M54.50 (ICD-10-CM) - Acute bilateral low back pain without sciatica  M81.0 (ICD-10-CM) - Age-related osteoporosis without current pathological fracture     THERAPY DIAG:  Muscle weakness (generalized)  Other abnormalities of gait and mobility  Other low back pain  Age-related osteoporosis without current pathological fracture  Acute pain of left knee  Rationale for  Evaluation and Treatment: Rehabilitation  ONSET DATE: ~3 weeks ago  SUBJECTIVE:   SUBJECTIVE STATEMENT: Patient reports just a little twinge in the knee.   From eval: Pt states she's had issues with both legs in the last 2 years. Pt was having L hip and leg pain in December. Pt was walking and her knee gave way ~3 weeks ago. Pt states she has been able to do her 12 hour shifts and be careful. Pt reports pain was not so bad she needed an injection.   PERTINENT HISTORY: Frieburgs disease R foot (AVN of metatarsal), OA R knee   PAIN:  Are you having pain? Yes: NPRS scale: 2/10  Pain location: left knee Pain description: twinge Aggravating factors: end of 12 hour shift, twisting (knee) Relieving factors: tylenol , ice and knee brace, rest  PRECAUTIONS: None  RED FLAGS: None   WEIGHT BEARING RESTRICTIONS: No  FALLS:  Has patient fallen in last 6 months? No  LIVING ENVIRONMENT: Lives with: lives with their spouse Lives in: House/apartment Stairs: Yes: External: 2 steps; none Has following equipment at home: None  OCCUPATION: ICU nurse at [employer]  PATIENT GOALS: Return to normal activities (pt bikes and rows x20 min each)  NEXT MD VISIT: PRN  OBJECTIVE:  Note: Objective measures were completed at Evaluation unless otherwise noted.  DIAGNOSTIC FINDINGS: MRI 11/19/23 L knee IMPRESSION: 1. Complete tear at the root of the posterior horn of the medial meniscus, with near absence of meniscal tissue within the 3 mm of the posterior horn closest to the root. Associated mild extrusion of the body of the medial meniscus. 2. Tricompartmental cartilage degenerative changes, severe within the lateral patellofemoral cartilage. 3. Small joint effusion.  Lumbar X-ray  IMPRESSION: 1. Mild compression deformity of superior endplate of L3 new compared to prior exam. Age indeterminate. 2. Mild degenerative joint changes of lumbar spine.  PATIENT SURVEYS:  Lower Extremity  Functional Score: 47 / 80 = 58.8 % 01/22/24: 59/80.   EDEMA:  "A little edema"  MUSCLE LENGTH: Hamstrings: Left slightly tighter than Right Thomas test: Did not assess  PALPATION: Mostly tender about medial L knee joint line and along edema around medial patella  01/22/24: L spine hypomobility, pain at L2-L4 PAIVM; tautness bilateral lumbar paraspinals   LOWER EXTREMITY ROM:  Active ROM Right eval Left eval 12/31/23   Hip flexion     Hip extension     Hip abduction     Hip adduction     Hip internal rotation     Hip external rotation     Knee flexion 125 125 Lt: 140; Rt: 140   Knee extension 0 -5 Lt: 0; Rt: 0   Ankle dorsiflexion     Ankle plantarflexion     Ankle inversion     Ankle eversion      (Blank rows = not tested)  LOWER EXTREMITY MMT:  MMT Right eval Left eval 01/22/24 02/21/24 02/28/24  Hip flexion 5 4* 5 bilateral; pain in low back with RLE  5 bilateral   Hip extension 4 3- 4 bilateral  4 bilateral  4 bilateral   Hip abduction 5 3+ 4 bilateral  4+ bilateral 5: Lt; Rt: 4+  Hip adduction 5 3+ 4+ bilateral     Hip internal rotation       Hip external rotation       Knee flexion 5 5   5  bilateral   Knee extension 5 5   5  bilateral   Ankle dorsiflexion       Ankle plantarflexion       Ankle inversion       Ankle eversion        (Blank rows = not tested, * = pain) LUMBAR ROM:   Active  AROM  01/22/24 AROM  02/13/24 02/28/24  Flexion Full  Full no pain WFL  Extension Full pain center of low back Full no pain WFL  Right lateral flexion WNL pain Rt low back WNL No pain WFL  Left lateral flexion WNL WNL no pain WFL  Right rotation WNL low back pain WNL no pain WFL   Left rotation WNL low back pain WNL mild pain L low back WFL   (Blank rows = not tested) UPPER EXTREMITY MMT:  MMT Right 02/28/24 Left 02/28/24  Shoulder flexion 5 5  Shoulder extension    Shoulder abduction 5 5  Shoulder adduction    Shoulder extension    Shoulder internal rotation 5 5   Shoulder external rotation 5 5  Middle trapezius    Lower trapezius    Elbow flexion 5 5  Elbow extension 5 5  Wrist flexion    Wrist extension    Wrist ulnar deviation    Wrist radial deviation    Wrist pronation    Wrist supination    Grip strength     (Blank rows = not tested)  LOWER EXTREMITY SPECIAL TESTS:  Hip special tests: Portia Brittle (FABER) test: negative and Craig's test: negative  FUNCTIONAL TESTS:  5x STS: 15.92 sec SLS: R 20 sec, L 19 sec  01/22/24: 5 x STS: 8.74 sec . 02/28/24: lifting 15 lb box- increased thoracic flexion    GAIT: Distance walked: Into clinic Assistive device utilized: L knee brace Level of assistance: Complete Independence Comments: Mildly antalgic with L knee slightly kept in flexion [facility] Adult PT Treatment:                                                DATE: 03/05/24 Therapeutic Exercise: Recumbent bike level 2 x 5 minutes   Neuromuscular re-ed: Half kneel chops 3 lbs 2 x 10  Dead bug 2 x 10 @ 2 lbs  Therapeutic Activity: Squat to overhead press 2 x 10 @ 3 lbs  Kettlebell swings 2 x 10 @ 5 lbs  Sumo squat with calf raise 2 x 10  Deadlift 2  10 @ 5 lbs   Self Care: Discussed strength progression discussing frequency, sets, reps, resistance as part of HEP  [facility] Adult PT Treatment:                                                DATE: 02/28/24 Therapeutic Exercise: Revised current knee and back HEP and added specifics for osteoporosis strengthening performing as needed   Self Care: Discussed  positions to avoid as it relates to osteoporosis  Lifting mechanics   RE-EVAL  [facility] Adult PT Treatment:                                                DATE: 02/24/2024 Therapeutic Exercise: SKTC x 6 DKTC x6 Dead bug x10 Double leg stretch progression 2x10 Neuromuscular re-ed: Prone: Straight leg hip extension 2x15  Bent knee hip extension 2x10 HS curls + RTB 2x10 LTR + bent arm rotation in opp Shoulder extension to neutral + RTB (high  setting) Modified lunge + straight arm pull down with GTB x10 each side Staggered stance paloff press + Blue TB x10 --> added quarter trunk rotation x10 Therapeutic Activity: Seated hip hinge --> standing hip hinge Squats + pillow in chair --> added cradling 5#KB x10 Pushing/pulling sled 4x20'            PATIENT EDUCATION:  Education details: HEP update  Person educated: Patient Education method: Explanation, demo, cues, handout Education comprehension: verbalized understanding, returned demo, cues   HOME EXERCISE PROGRAM:  Access Code: [email or web address] URL: [email or web address] Date: 03/05/2024 Prepared by: Forrestine Ike  Exercises - Supine Lower Trunk Rotation  - 1 x daily - 7 x weekly - 1 sets - 10 reps - Supine Figure 4 Piriformis Stretch  - 1 x daily - 7 x weekly - 3 sets - 30  hold - Sidelying Thoracic Rotation with Open Book  - 1 x daily - 7 x weekly - 2 sets - 10 reps - Bird Dog  - 1 x daily - 3 x weekly - 3 sets - 10 reps - Standing Bent Over Single Arm Shoulder Row  - 1 x daily - 3 x weekly - 2 sets - 10 reps - Prone Alternating Arm and Leg Lifts  - 1 x daily - 3 x weekly - 2 sets - 10 reps - The Diver  - 1 x daily - 3 x weekly - 2 sets - 10 reps - Lunge Matrix on Slider  - 1 x daily - 3 x weekly - 2 sets - 10 reps - Side Stepping with Resistance at Ankles  - 1 x daily - 3 x weekly - 2 sets - 10 reps - Band Walks  - 1 x daily - 3 x weekly - 2 sets - 10 reps - Standing Bicep Curls Supinated with Dumbbells  - 1 x daily - 3 x weekly - 2 sets - 10 reps - Supine Chest Press with Dumbbells  - 1 x daily - 3 x weekly - 2 sets - 10 reps - Standing Bent Over Triceps Extension  - 1 x daily - 3 x weekly - 2 sets - 10 reps - Squat with Medicine [employer]  - 1 x daily - 3 x weekly - 2 sets - 10 reps - Kettlebell Swing  - 1 x daily - 3 x weekly - 2 sets - 10 reps - Sumo Squat with Dumbbell  - 1 x daily - 3 x weekly - 2 sets - 10 reps - Half Kneeling Diagonal Chops  with Medicine Ball  - 1 x daily - 3 x weekly - 2 sets - 10 reps - Supine Dead Bug with Leg Extension  - 1 x daily - 3 x weekly - 2 sets - 10 reps - Kettlebell Deadlift  -  1 x daily - 3 x weekly - 2 sets - 10 reps   ASSESSMENT:  CLINICAL IMPRESSION:  Patient tolerated session well today focusing on full body strength progression with good tolerance. We updated HEP to include further strengthening and had lengthy discussion regarding frequency of strength training, sets, reps, and resistance to utilize. No increase in knee pain reported throughout session. Minimal cues required to reduce thoracic flexion with dead lift.    RE-EVAL: Patient with new referral for age-related osteoporosis without current pathological fracture. Re-eval was performed today with patient noted to have mild gluteal weakness and aberrant lifting mechanics. Overall she has responded well in PT for her acute Lt knee pain and low back pain reporting minimal occasional pain. Today's session focused on updating HEP to include upper and lower extremity resisted strengthening exercises to focus on developing home program that she can continue to help manage her osteoporosis. She will benefit from skilled PT 1 x weekly for 4 weeks to address lingering weakness, improve body mechanics with functional tasks, and train in home program that she can continue independently.   RE-EVAL: Re-assessment of the Lt knee and re-evaluation performed for new referral for acute bilateral low back pain without sciatica. She has progressed well in regards to her Lt knee reporting occasional mild levels of pain with full ROM and strength about the Lt knee. She has met majority of goals related to the knee, with exception of hip strength goal. We discussed continuing with HEP 2-3 x weekly for maintenance/independent progression and that we will be focusing our treatment on the acute low back pain which began at the beginning of March from repetitive coughing  due to COVID. Upon assessment she is noted to have good lumbar AROM, though experiences pain with extension, rotation, and Rt lateral flexion. She has tautness about lumbar paraspinals and L-spine hypomobility with pain noted during PAIVM L2-L4. She has hip and core weakness and reports increased pain with functional bending and pushing tasks at work. She will benefit from skilled PT to address the above stated deficits in order to optimize her function and assist in overall pain reduction.    OBJECTIVE IMPAIRMENTS: Abnormal gait, decreased activity tolerance, decreased endurance, decreased mobility, difficulty walking, decreased ROM, decreased strength, increased edema, increased fascial restrictions, improper body mechanics, and pain.     GOALS: Goals reviewed with patient? Yes  SHORT TERM GOALS: Target date: 12/26/2023  Pt will be ind with initial HEP Baseline: Goal status: MET  2.  Pt will demo L = R knee ROM Baseline:  Goal status: MET   LONG TERM GOALS: Target date: 03/28/24  Pt will be ind with management and progression of HEP for full return to her normal gym activities (I.e. rowing and biking) Baseline:  02/13/24: 20 min x 4 days/week Goal status: MET   2.  Pt will report >/=75% improvement in her overall knee pain with activity Baseline:  01/22/24: reports 75% improvement  Goal status: MET  3.  Pt will demo improved 5x STS to </=10 sec for increased functional LE strength Baseline:  Goal status: MET  4.  Pt will demo at least 4+/5 hip abductor and extensor for improved knee and lumbopelvic stability.  Baseline:  02/21/24: hip extension 4/5 (bilateral) Goal status: IN PROGRESS   5.  Pt will have improved LEFS to >/=56/80 to demo [facility] Baseline:  Goal status: MET  6. Patient will be able to sustained bent over lunge positioning for at least 30 seconds without  pain to improve tolerance to work specific activity.   Baseline: 6/10 pain with bedside work   02/13/24: no  pain  Goal status: MET  7. Patient will demonstrate pain free lumbar AROM to improve ability to complete reaching and bending tasks.   Baseline: see AROM above 02/13/24: mild pain with L rotation  Goal Status: MET  8. Patient will demonstrate proper lifting mechanics of at least 15 lbs from floor to waist height to reduce stress on her back.   Baseline: see above  Goal status: NEW  9. Patient will be independent with advanced home program as it relates to osteoporosis management to progress/maintain current level of function.   Baseline: HEP issued  Goal status: NEW   PLAN:  PT FREQUENCY: 1x/week  PT DURATION: 4 weeks  PLANNED INTERVENTIONS: [id number]- PT Re-evaluation, 97110-Therapeutic exercises, 97530- Therapeutic activity, [id number]- Neuromuscular re-education, 97535- Self Care, [id number]- Manual therapy, [id number]- Gait training, [phone]- Aquatic Therapy, [phone]- Electrical stimulation (unattended), [id number]- Electrical stimulation (manual), [id number]- Vasopneumatic device, [id number]- Ultrasound, [id number]- Traction (mechanical), [id number]- Ionotophoresis 4mg /ml Dexamethasone , Patient/Family education, Balance training, Stair training, Taping, Dry Needling, Joint mobilization, Spinal mobilization, Cryotherapy, and Moist heat  PLAN FOR NEXT SESSION: glute strengthening; functional strengthening incorporating upper and lower extremity strengthening.   Anaily Ashbaugh, PT, DPT, ATC 03/05/24 9:30 AM

## 2024-03-11 NOTE — Therapy (Signed)
 OUTPATIENT PHYSICAL THERAPY TREATMENT     Patient Name: Danielle Braun MRN: [id number] DOB:Dec 05, 1960, 63 y.o., female Today's Date: 03/12/2024  END OF SESSION:  PT End of Session - 03/12/24 0851     Visit Number 24    Number of Visits 26    Date for PT Re-Evaluation 03/28/24    Authorization Type [employer]    PT Start Time [phone]    PT Stop Time 0930    PT Time Calculation (min) 42 min    Activity Tolerance Patient tolerated treatment well    Behavior During Therapy WFL for tasks assessed/performed                Past Medical History:  Diagnosis Date   Acne    Arrhythmia    Bursitis, subacromial    Left   Endometriosis    GERD (gastroesophageal reflux disease)    Joint pain    Palpitations    Retinal tear    Sinusitis    SOB (shortness of breath)    Vitamin D  deficiency    Past Surgical History:  Procedure Laterality Date   ABDOMINAL HYSTERECTOMY     ABDOMINAL SURGERY     csection   DILATION AND CURETTAGE OF UTERUS     SHOULDER ARTHROSCOPY WITH SUBACROMIAL DECOMPRESSION Left 10/31/2017   Procedure: SHOULDER ARTHROSCOPY WITH BURSECTOMY AND SUBACROMIAL DECOMPRESSION;  Surgeon: Sammye Cristal, MD;  Location: [facility] OR;  Service: Orthopedics;  Laterality: Left;  Left shoulder arthroscopy bursectomy and subacromial decompression   TONSILLECTOMY     Patient Active Problem List   Diagnosis Date Noted   Lumbar spondylosis 01/21/2024   COVID 12/26/2023   Chronic cough 12/26/2023   Deviated nasal septum 12/26/2023   Hypertrophy of nasal turbinates 12/26/2023   Traumatic tear of meniscus of left knee 11/15/2023   Prediabetes 04/17/2023   History of colonic polyps 04/17/2023   Other fatigue 04/16/2023   SOB (shortness of breath) on exertion 04/16/2023   Health care maintenance 04/16/2023   Family history of diabetes mellitus 04/16/2023   Depression screening 04/16/2023   Generalized obesity 04/16/2023   BMI 30.0-30.9,adult 04/16/2023   Seasonal  allergies 11/07/2022   Family history of osteoporosis 11/06/2022   Postmenopause 11/06/2022   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   Obesity (BMI 30-39.9) 01/26/2022   Patellofemoral pain syndrome of right knee 01/17/2022   Freiberg's disease, right 11/09/2021   Age-related osteoporosis without current pathological fracture 11/01/2021   Chronic sinusitis, unspecified 11/01/2021   Retinal tear of left eye 07/05/2021   History of laparoscopic-assisted vaginal hysterectomy 03/31/2021   History of sacrocolpopexy 03/31/2021   Blepharitis of upper and lower eyelids of both eyes 08/23/2020   Dry eye syndrome of both lacrimal glands 08/23/2020   Nuclear sclerotic cataract of both eyes 08/23/2020   Primary refluxing megaureter 03/02/2020   Sensation of feeling hot 01/18/2020   Chronic left shoulder pain 11/11/2018   Vitamin D  insufficiency 11/11/2018   Low bone mass 09/26/2017   Immune to measles 02/20/2016   Allergic rhinitis 02/06/2016   GERD (gastroesophageal reflux disease) 02/06/2016   Hyperlipemia 02/06/2016   Keratoconjunctivitis sicca of both eyes not specified as Sjogren's 02/06/2016   Myopia of both eyes with astigmatism 02/06/2016   Posterior vitreous detachment of left eye 02/06/2016   Premature atrial contraction 02/06/2016   History of right salpingo-oophorectomy 03/14/2010   Acute recurrent maxillary sinusitis 11/06/2008    PCP: Jayne Mews, MD  REFERRING PROVIDER: Gean Keels, MD  REFERRING  DIAG:  B14.782N (ICD-10-CM) - Traumatic tear of meniscus of left knee, subsequent encounter M54.50 (ICD-10-CM) - Acute bilateral low back pain without sciatica  M81.0 (ICD-10-CM) - Age-related osteoporosis without current pathological fracture     THERAPY DIAG:  Muscle weakness (generalized)  Other abnormalities of gait and mobility  Other low back pain  Age-related osteoporosis without current pathological fracture  Acute pain of left  knee  Rationale for Evaluation and Treatment: Rehabilitation  ONSET DATE: ~3 weeks ago  SUBJECTIVE:   SUBJECTIVE STATEMENT: I think I'm about ready to be done.   From eval: Pt states she's had issues with both legs in the last 2 years. Pt was having L hip and leg pain in December. Pt was walking and her knee gave way ~3 weeks ago. Pt states she has been able to do her 12 hour shifts and be careful. Pt reports pain was not so bad she needed an injection.   PERTINENT HISTORY: Frieburgs disease R foot (AVN of metatarsal), OA R knee   PAIN:  Are you having pain? Yes: NPRS scale: 0/10  Pain location: left knee Pain description: twinge Aggravating factors: end of 12 hour shift, twisting (knee) Relieving factors: tylenol , ice and knee brace, rest  PRECAUTIONS: None  RED FLAGS: None   WEIGHT BEARING RESTRICTIONS: No  FALLS:  Has patient fallen in last 6 months? No  LIVING ENVIRONMENT: Lives with: lives with their spouse Lives in: House/apartment Stairs: Yes: External: 2 steps; none Has following equipment at home: None  OCCUPATION: ICU nurse at [employer]  PATIENT GOALS: Return to normal activities (pt bikes and rows x20 min each)  NEXT MD VISIT: PRN  OBJECTIVE:  Note: Objective measures were completed at Evaluation unless otherwise noted.  DIAGNOSTIC FINDINGS: MRI 11/19/23 L knee IMPRESSION: 1. Complete tear at the root of the posterior horn of the medial meniscus, with near absence of meniscal tissue within the 3 mm of the posterior horn closest to the root. Associated mild extrusion of the body of the medial meniscus. 2. Tricompartmental cartilage degenerative changes, severe within the lateral patellofemoral cartilage. 3. Small joint effusion.  Lumbar X-ray  IMPRESSION: 1. Mild compression deformity of superior endplate of L3 new compared to prior exam. Age indeterminate. 2. Mild degenerative joint changes of lumbar spine.  PATIENT SURVEYS:  Lower  Extremity Functional Score: 47 / 80 = 58.8 % 01/22/24: 59/80.   EDEMA:  "A little edema"  MUSCLE LENGTH: Hamstrings: Left slightly tighter than Right Thomas test: Did not assess  PALPATION: Mostly tender about medial L knee joint line and along edema around medial patella  01/22/24: L spine hypomobility, pain at L2-L4 PAIVM; tautness bilateral lumbar paraspinals   LOWER EXTREMITY ROM:  Active ROM Right eval Left eval 12/31/23   Hip flexion     Hip extension     Hip abduction     Hip adduction     Hip internal rotation     Hip external rotation     Knee flexion 125 125 Lt: 140; Rt: 140   Knee extension 0 -5 Lt: 0; Rt: 0   Ankle dorsiflexion     Ankle plantarflexion     Ankle inversion     Ankle eversion      (Blank rows = not tested)  LOWER EXTREMITY MMT:  MMT Right eval Left eval 01/22/24 02/21/24 02/28/24  Hip flexion 5 4* 5 bilateral; pain in low back with RLE  5 bilateral   Hip extension 4 3- 4 bilateral  4 bilateral  4 bilateral   Hip abduction 5 3+ 4 bilateral  4+ bilateral 5: Lt; Rt: 4+  Hip adduction 5 3+ 4+ bilateral     Hip internal rotation       Hip external rotation       Knee flexion 5 5   5  bilateral   Knee extension 5 5   5  bilateral   Ankle dorsiflexion       Ankle plantarflexion       Ankle inversion       Ankle eversion        (Blank rows = not tested, * = pain) LUMBAR ROM:   Active  AROM  01/22/24 AROM  02/13/24 02/28/24  Flexion Full  Full no pain WFL  Extension Full pain center of low back Full no pain WFL  Right lateral flexion WNL pain Rt low back WNL No pain WFL  Left lateral flexion WNL WNL no pain WFL  Right rotation WNL low back pain WNL no pain WFL   Left rotation WNL low back pain WNL mild pain L low back WFL   (Blank rows = not tested) UPPER EXTREMITY MMT:  MMT Right 02/28/24 Left 02/28/24  Shoulder flexion 5 5  Shoulder extension    Shoulder abduction 5 5  Shoulder adduction    Shoulder extension    Shoulder internal rotation  5 5  Shoulder external rotation 5 5  Middle trapezius    Lower trapezius    Elbow flexion 5 5  Elbow extension 5 5  Wrist flexion    Wrist extension    Wrist ulnar deviation    Wrist radial deviation    Wrist pronation    Wrist supination    Grip strength     (Blank rows = not tested)  LOWER EXTREMITY SPECIAL TESTS:  Hip special tests: Portia Brittle (FABER) test: negative and Craig's test: negative  FUNCTIONAL TESTS:  5x STS: 15.92 sec SLS: R 20 sec, L 19 sec  01/22/24: 5 x STS: 8.74 sec . 02/28/24: lifting 15 lb box- increased thoracic flexion    GAIT: Distance walked: Into clinic Assistive device utilized: L knee brace Level of assistance: Complete Independence Comments: Mildly antalgic with L knee slightly kept in flexion   [facility] Adult PT Treatment:                                                DATE: 03/12/24 Therapeutic Exercise: Recumbent bike level 2 x 5 minutes  Neuromuscular re-ed: Half kneel chops 3 lbs 2 x 10  SLS alt tap of hands to KB on gray mat table B Plank with hands on mat table with shoulder taps (a bit challenging) Plank on elbows  Therapeutic Activity: Squat to overhead press 2 x 10 @ 3 lbs  Kettlebell swings 1 x 5 @ 5 lbs and 10lbs then 2x10 @ 10lbs Sumo squat with calf raise 2 x 10 with 5# KB Deadlift 2 x 10 @ 10 lbs also did with black powercord as option for travelling Biceps curl to OH press 5#wts 2x10   [facility] Adult PT Treatment:  DATE: 03/05/24 Therapeutic Exercise: Recumbent bike level 2 x 5 minutes   Neuromuscular re-ed: Half kneel chops 3 lbs 2 x 10  Dead bug 2 x 10 @ 2 lbs  Therapeutic Activity: Squat to overhead press 2 x 10 @ 3 lbs  Kettlebell swings 2 x 10 @ 5 lbs  Sumo squat with calf raise 2 x 10  Deadlift 2  10 @ 5 lbs   Self Care: Discussed strength progression discussing frequency, sets, reps, resistance as part of HEP  [facility] Adult PT Treatment:                                                 DATE: 02/28/24 Therapeutic Exercise: Revised current knee and back HEP and added specifics for osteoporosis strengthening performing as needed   Self Care: Discussed positions to avoid as it relates to osteoporosis  Lifting mechanics   RE-EVAL  [facility] Adult PT Treatment:                                                DATE: 02/24/2024 Therapeutic Exercise: SKTC x 6 DKTC x6 Dead bug x10 Double leg stretch progression 2x10 Neuromuscular re-ed: Prone: Straight leg hip extension 2x15  Bent knee hip extension 2x10 HS curls + RTB 2x10 LTR + bent arm rotation in opp Shoulder extension to neutral + RTB (high setting) Modified lunge + straight arm pull down with GTB x10 each side Staggered stance paloff press + Blue TB x10 --> added quarter trunk rotation x10 Therapeutic Activity: Seated hip hinge --> standing hip hinge Squats + pillow in chair --> added cradling 5#KB x10 Pushing/pulling sled 4x20'            PATIENT EDUCATION:  Education details: HEP update  Person educated: Patient Education method: Explanation, demo, cues, handout Education comprehension: verbalized understanding, returned demo, cues   HOME EXERCISE PROGRAM:  Access Code: [email or web address] URL: [email or web address] Date: 03/12/2024 Prepared by: Concha Deed  Exercises - Supine Lower Trunk Rotation  - 1 x daily - 7 x weekly - 1 sets - 10 reps - Supine Figure 4 Piriformis Stretch  - 1 x daily - 7 x weekly - 3 sets - 30  hold - Sidelying Thoracic Rotation with Open Book  - 1 x daily - 7 x weekly - 2 sets - 10 reps - Bird Dog  - 1 x daily - 3 x weekly - 3 sets - 10 reps - Standing Bent Over Single Arm Shoulder Row  - 1 x daily - 3 x weekly - 2 sets - 10 reps - Prone Alternating Arm and Leg Lifts  - 1 x daily - 3 x weekly - 2 sets - 10 reps - The Diver  - 1 x daily - 3 x weekly - 2 sets - 10 reps - Lunge Matrix on Slider  - 1 x daily - 3 x weekly - 2 sets - 10 reps - Side Stepping with Resistance at  Ankles  - 1 x daily - 3 x weekly - 2 sets - 10 reps - Band Walks  - 1 x daily - 3 x weekly - 2 sets - 10 reps - Standing Bicep Curls Supinated with Dumbbells  - 1 x daily -  3 x weekly - 2 sets - 10 reps - Supine Chest Press with Dumbbells  - 1 x daily - 3 x weekly - 2 sets - 10 reps - Standing Bent Over Triceps Extension  - 1 x daily - 3 x weekly - 2 sets - 10 reps - Squat with Medicine [employer]  - 1 x daily - 3 x weekly - 2 sets - 10 reps - Kettlebell Swing  - 1 x daily - 3 x weekly - 2 sets - 10 reps - Sumo Squat with Dumbbell  - 1 x daily - 3 x weekly - 2 sets - 10 reps - Half Kneeling Diagonal Chops with Medicine Ball  - 1 x daily - 3 x weekly - 2 sets - 10 reps - Supine Dead Bug with Leg Extension  - 1 x daily - 3 x weekly - 2 sets - 10 reps - Kettlebell Deadlift  - 1 x daily - 3 x weekly - 2 sets - 10 reps - Standard Plank  - 2 x daily - 7 x weekly - 1 sets - 5 reps - max hold   ASSESSMENT:  CLINICAL IMPRESSION:  Patient continues to demonstrate good form with functional activities and exercises with minimal cueing required. She was challenged by plank and SLS with hand taps today. We also progressed many exercises with increased weight without loss of form. Carmelina Chinchilla feels she is ready for discharge at next visit. Unmet goals should be assessed and HEP finalized.    RE-EVAL: Patient with new referral for age-related osteoporosis without current pathological fracture. Re-eval was performed today with patient noted to have mild gluteal weakness and aberrant lifting mechanics. Overall she has responded well in PT for her acute Lt knee pain and low back pain reporting minimal occasional pain. Today's session focused on updating HEP to include upper and lower extremity resisted strengthening exercises to focus on developing home program that she can continue to help manage her osteoporosis. She will benefit from skilled PT 1 x weekly for 4 weeks to address lingering weakness, improve  body mechanics with functional tasks, and train in home program that she can continue independently.   RE-EVAL: Re-assessment of the Lt knee and re-evaluation performed for new referral for acute bilateral low back pain without sciatica. She has progressed well in regards to her Lt knee reporting occasional mild levels of pain with full ROM and strength about the Lt knee. She has met majority of goals related to the knee, with exception of hip strength goal. We discussed continuing with HEP 2-3 x weekly for maintenance/independent progression and that we will be focusing our treatment on the acute low back pain which began at the beginning of March from repetitive coughing due to COVID. Upon assessment she is noted to have good lumbar AROM, though experiences pain with extension, rotation, and Rt lateral flexion. She has tautness about lumbar paraspinals and L-spine hypomobility with pain noted during PAIVM L2-L4. She has hip and core weakness and reports increased pain with functional bending and pushing tasks at work. She will benefit from skilled PT to address the above stated deficits in order to optimize her function and assist in overall pain reduction.    OBJECTIVE IMPAIRMENTS: Abnormal gait, decreased activity tolerance, decreased endurance, decreased mobility, difficulty walking, decreased ROM, decreased strength, increased edema, increased fascial restrictions, improper body mechanics, and pain.     GOALS: Goals reviewed with patient? Yes  SHORT TERM GOALS: Target date: 12/26/2023  Pt  will be ind with initial HEP Baseline: Goal status: MET  2.  Pt will demo L = R knee ROM Baseline:  Goal status: MET   LONG TERM GOALS: Target date: 03/28/24  Pt will be ind with management and progression of HEP for full return to her normal gym activities (I.e. rowing and biking) Baseline:  02/13/24: 20 min x 4 days/week Goal status: MET   2.  Pt will report >/=75% improvement in her overall knee  pain with activity Baseline:  01/22/24: reports 75% improvement  Goal status: MET  3.  Pt will demo improved 5x STS to </=10 sec for increased functional LE strength Baseline:  Goal status: MET  4.  Pt will demo at least 4+/5 hip abductor and extensor for improved knee and lumbopelvic stability.  Baseline:  02/21/24: hip extension 4/5 (bilateral) Goal status: IN PROGRESS   5.  Pt will have improved LEFS to >/=56/80 to demo MCID Baseline:  Goal status: MET  6. Patient will be able to sustained bent over lunge positioning for at least 30 seconds without pain to improve tolerance to work specific activity.   Baseline: 6/10 pain with bedside work   02/13/24: no pain  Goal status: MET  7. Patient will demonstrate pain free lumbar AROM to improve ability to complete reaching and bending tasks.   Baseline: see AROM above 02/13/24: mild pain with L rotation  Goal Status: MET  8. Patient will demonstrate proper lifting mechanics of at least 15 lbs from floor to waist height to reduce stress on her back.   Baseline: see above  Goal status: NEW  9. Patient will be independent with advanced home program as it relates to osteoporosis management to progress/maintain current level of function.   Baseline: HEP issued  Goal status: NEW   PLAN:  PT FREQUENCY: 1x/week  PT DURATION: 4 weeks  PLANNED INTERVENTIONS: [id number]- PT Re-evaluation, 97110-Therapeutic exercises, 97530- Therapeutic activity, [id number]- Neuromuscular re-education, 97535- Self Care, [id number]- Manual therapy, [id number]- Gait training, [phone]- Aquatic Therapy, [phone]- Electrical stimulation (unattended), [id number]- Electrical stimulation (manual), [id number]- Vasopneumatic device, [id number]- Ultrasound, [id number]- Traction (mechanical), [id number]- Ionotophoresis 4mg /ml Dexamethasone , Patient/Family education, Balance training, Stair training, Taping, Dry Needling, Joint mobilization, Spinal mobilization, Cryotherapy, and Moist heat  PLAN FOR NEXT SESSION: check  goals, finalize HEP and discharge.  Jinx Mourning, PT  03/12/24 9:45 AM

## 2024-03-12 ENCOUNTER — Ambulatory Visit: Admitting: Physical Therapy

## 2024-03-12 ENCOUNTER — Encounter: Payer: Self-pay | Admitting: Physical Therapy

## 2024-03-12 DIAGNOSIS — M810 Age-related osteoporosis without current pathological fracture: Secondary | ICD-10-CM

## 2024-03-12 DIAGNOSIS — R2689 Other abnormalities of gait and mobility: Secondary | ICD-10-CM

## 2024-03-12 DIAGNOSIS — M6281 Muscle weakness (generalized): Secondary | ICD-10-CM

## 2024-03-12 DIAGNOSIS — M25562 Pain in left knee: Secondary | ICD-10-CM

## 2024-03-12 DIAGNOSIS — M5459 Other low back pain: Secondary | ICD-10-CM

## 2024-03-13 ENCOUNTER — Other Ambulatory Visit: Payer: Self-pay

## 2024-03-13 ENCOUNTER — Other Ambulatory Visit (HOSPITAL_COMMUNITY)

## 2024-03-13 NOTE — Progress Notes (Signed)
 Specialty Pharmacy Refill Coordination Note  Danielle Braun is a 63 y.o. female contacted today regarding refills of specialty medication(s) Abaloparatide  (Tymlos )   Patient requested (Patient-Rptd) Pickup at [facility] date: (Patient-Rptd) 03/23/24   Medication will be filled on 03/20/24.

## 2024-03-17 ENCOUNTER — Other Ambulatory Visit: Payer: Self-pay

## 2024-03-17 ENCOUNTER — Other Ambulatory Visit (HOSPITAL_COMMUNITY)
Admission: RE | Admit: 2024-03-17 | Discharge: 2024-03-17 | Disposition: A | Discharge status: Home / Self Care | Payer: Self-pay | Source: Ambulatory Visit | Attending: Medical Genetics | Admitting: Medical Genetics

## 2024-03-17 NOTE — Progress Notes (Signed)
 Patient was left a voice mail that on Monday 03/23/24 the pharmacy will be closed due to [facility] day holiday. Patient was advised that medication can still be picked up on Friday 03/20/24 or the following Tuesday 03/24/24 and to call the pharmacy should they have any questions or concerns.

## 2024-03-19 ENCOUNTER — Ambulatory Visit

## 2024-03-19 DIAGNOSIS — M810 Age-related osteoporosis without current pathological fracture: Secondary | ICD-10-CM

## 2024-03-19 DIAGNOSIS — M5459 Other low back pain: Secondary | ICD-10-CM

## 2024-03-19 DIAGNOSIS — M6281 Muscle weakness (generalized): Secondary | ICD-10-CM

## 2024-03-19 DIAGNOSIS — M25562 Pain in left knee: Secondary | ICD-10-CM

## 2024-03-19 DIAGNOSIS — R2689 Other abnormalities of gait and mobility: Secondary | ICD-10-CM

## 2024-03-19 NOTE — Therapy (Signed)
 OUTPATIENT PHYSICAL THERAPY TREATMENT PHYSICAL THERAPY DISCHARGE SUMMARY  Visits from Start of Care: 25  Current functional level related to goals / functional outcomes: All goals met   Remaining deficits: N/A   Education / Equipment: See education below    Patient agrees to discharge. Patient goals were met. Patient is being discharged due to meeting the stated rehab goals.     Patient Name: Danielle Braun MRN: [id number] DOB:Feb 26, 1961, 63 y.o., female Today's Date: 03/19/2024  END OF SESSION:  PT End of Session - 03/19/24 1106     Visit Number 25    Number of Visits 26    Date for PT Re-Evaluation 03/28/24    Authorization Type [employer]    PT Start Time 1105    PT Stop Time 1143    PT Time Calculation (min) 38 min    Activity Tolerance Patient tolerated treatment well    Behavior During Therapy WFL for tasks assessed/performed                 Past Medical History:  Diagnosis Date   Acne    Arrhythmia    Bursitis, subacromial    Left   Endometriosis    GERD (gastroesophageal reflux disease)    Joint pain    Palpitations    Retinal tear    Sinusitis    SOB (shortness of breath)    Vitamin D  deficiency    Past Surgical History:  Procedure Laterality Date   ABDOMINAL HYSTERECTOMY     ABDOMINAL SURGERY     csection   DILATION AND CURETTAGE OF UTERUS     SHOULDER ARTHROSCOPY WITH SUBACROMIAL DECOMPRESSION Left 10/31/2017   Procedure: SHOULDER ARTHROSCOPY WITH BURSECTOMY AND SUBACROMIAL DECOMPRESSION;  Surgeon: Sammye Cristal, MD;  Location: [facility] OR;  Service: Orthopedics;  Laterality: Left;  Left shoulder arthroscopy bursectomy and subacromial decompression   TONSILLECTOMY     Patient Active Problem List   Diagnosis Date Noted   Lumbar spondylosis 01/21/2024   COVID 12/26/2023   Chronic cough 12/26/2023   Deviated nasal septum 12/26/2023   Hypertrophy of nasal turbinates 12/26/2023   Traumatic tear of meniscus of left knee 11/15/2023    Prediabetes 04/17/2023   History of colonic polyps 04/17/2023   Other fatigue 04/16/2023   SOB (shortness of breath) on exertion 04/16/2023   Health care maintenance 04/16/2023   Family history of diabetes mellitus 04/16/2023   Depression screening 04/16/2023   Generalized obesity 04/16/2023   BMI 30.0-30.9,adult 04/16/2023   Seasonal allergies 11/07/2022   Family history of osteoporosis 11/06/2022   Postmenopause 11/06/2022   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   Obesity (BMI 30-39.9) 01/26/2022   Patellofemoral pain syndrome of right knee 01/17/2022   Freiberg's disease, right 11/09/2021   Age-related osteoporosis without current pathological fracture 11/01/2021   Chronic sinusitis, unspecified 11/01/2021   Retinal tear of left eye 07/05/2021   History of laparoscopic-assisted vaginal hysterectomy 03/31/2021   History of sacrocolpopexy 03/31/2021   Blepharitis of upper and lower eyelids of both eyes 08/23/2020   Dry eye syndrome of both lacrimal glands 08/23/2020   Nuclear sclerotic cataract of both eyes 08/23/2020   Primary refluxing megaureter 03/02/2020   Sensation of feeling hot 01/18/2020   Chronic left shoulder pain 11/11/2018   Vitamin D  insufficiency 11/11/2018   Low bone mass 09/26/2017   Immune to measles 02/20/2016   Allergic rhinitis 02/06/2016   GERD (gastroesophageal reflux disease) 02/06/2016   Hyperlipemia 02/06/2016   Keratoconjunctivitis sicca of both  eyes not specified as Sjogren's 02/06/2016   Myopia of both eyes with astigmatism 02/06/2016   Posterior vitreous detachment of left eye 02/06/2016   Premature atrial contraction 02/06/2016   History of right salpingo-oophorectomy 03/14/2010   Acute recurrent maxillary sinusitis 11/06/2008    PCP: Jayne Mews, MD  REFERRING PROVIDER: Gean Keels, MD  REFERRING DIAG:  [phone] (ICD-10-CM) - Traumatic tear of meniscus of left knee, subsequent encounter M54.50 (ICD-10-CM) - Acute  bilateral low back pain without sciatica  M81.0 (ICD-10-CM) - Age-related osteoporosis without current pathological fracture     THERAPY DIAG:  Muscle weakness (generalized)  Other abnormalities of gait and mobility  Other low back pain  Age-related osteoporosis without current pathological fracture  Acute pain of left knee  Rationale for Evaluation and Treatment: Rehabilitation  ONSET DATE: ~3 weeks ago  SUBJECTIVE:   SUBJECTIVE STATEMENT: Patient reports she is doing pretty good. Back has not been bothering her and gets occasional twinges in the Lt knee.   From eval: Pt states she's had issues with both legs in the last 2 years. Pt was having L hip and leg pain in December. Pt was walking and her knee gave way ~3 weeks ago. Pt states she has been able to do her 12 hour shifts and be careful. Pt reports pain was not so bad she needed an injection.   PERTINENT HISTORY: Frieburgs disease R foot (AVN of metatarsal), OA R knee   PAIN:  Are you having pain?no   PRECAUTIONS: None  RED FLAGS: None   WEIGHT BEARING RESTRICTIONS: No  FALLS:  Has patient fallen in last 6 months? No  LIVING ENVIRONMENT: Lives with: lives with their spouse Lives in: House/apartment Stairs: Yes: External: 2 steps; none Has following equipment at home: None  OCCUPATION: ICU nurse at [employer]  PATIENT GOALS: Return to normal activities (pt bikes and rows x20 min each)  NEXT MD VISIT: PRN  OBJECTIVE:  Note: Objective measures were completed at Evaluation unless otherwise noted.  DIAGNOSTIC FINDINGS: MRI 11/19/23 L knee IMPRESSION: 1. Complete tear at the root of the posterior horn of the medial meniscus, with near absence of meniscal tissue within the 3 mm of the posterior horn closest to the root. Associated mild extrusion of the body of the medial meniscus. 2. Tricompartmental cartilage degenerative changes, severe within the lateral patellofemoral cartilage. 3. Small joint  effusion.  Lumbar X-ray  IMPRESSION: 1. Mild compression deformity of superior endplate of L3 new compared to prior exam. Age indeterminate. 2. Mild degenerative joint changes of lumbar spine.  PATIENT SURVEYS:  Lower Extremity Functional Score: 47 / 80 = 58.8 % 01/22/24: 59/80.   EDEMA:  "A little edema"  MUSCLE LENGTH: Hamstrings: Left slightly tighter than Right Thomas test: Did not assess  PALPATION: Mostly tender about medial L knee joint line and along edema around medial patella  01/22/24: L spine hypomobility, pain at L2-L4 PAIVM; tautness bilateral lumbar paraspinals   LOWER EXTREMITY ROM:  Active ROM Right eval Left eval 12/31/23   Hip flexion     Hip extension     Hip abduction     Hip adduction     Hip internal rotation     Hip external rotation     Knee flexion 125 125 Lt: 140; Rt: 140   Knee extension 0 -5 Lt: 0; Rt: 0   Ankle dorsiflexion     Ankle plantarflexion     Ankle inversion     Ankle eversion      (  Blank rows = not tested)  LOWER EXTREMITY MMT:  MMT Right eval Left eval 01/22/24 02/21/24 02/28/24 03/19/24  Hip flexion 5 4* 5 bilateral; pain in low back with RLE  5 bilateral    Hip extension 4 3- 4 bilateral  4 bilateral  4 bilateral  5 bilateral   Hip abduction 5 3+ 4 bilateral  4+ bilateral 5: Lt; Rt: 4+ 5 bilateral   Hip adduction 5 3+ 4+ bilateral      Hip internal rotation        Hip external rotation        Knee flexion 5 5   5  bilateral    Knee extension 5 5   5  bilateral    Ankle dorsiflexion        Ankle plantarflexion        Ankle inversion        Ankle eversion         (Blank rows = not tested, * = pain) LUMBAR ROM:   Active  AROM  01/22/24 AROM  02/13/24 02/28/24  Flexion Full  Full no pain WFL  Extension Full pain center of low back Full no pain WFL  Right lateral flexion WNL pain Rt low back WNL No pain WFL  Left lateral flexion WNL WNL no pain WFL  Right rotation WNL low back pain WNL no pain WFL   Left rotation WNL  low back pain WNL mild pain L low back WFL   (Blank rows = not tested) UPPER EXTREMITY MMT:  MMT Right 02/28/24 Left 02/28/24  Shoulder flexion 5 5  Shoulder extension    Shoulder abduction 5 5  Shoulder adduction    Shoulder extension    Shoulder internal rotation 5 5  Shoulder external rotation 5 5  Middle trapezius    Lower trapezius    Elbow flexion 5 5  Elbow extension 5 5  Wrist flexion    Wrist extension    Wrist ulnar deviation    Wrist radial deviation    Wrist pronation    Wrist supination    Grip strength     (Blank rows = not tested)  LOWER EXTREMITY SPECIAL TESTS:  Hip special tests: Portia Brittle (FABER) test: negative and Craig's test: negative  FUNCTIONAL TESTS:  5x STS: 15.92 sec SLS: R 20 sec, L 19 sec  01/22/24: 5 x STS: 8.74 sec . 02/28/24: lifting 15 lb box- increased thoracic flexion    GAIT: Distance walked: Into clinic Assistive device utilized: L knee brace Level of assistance: Complete Independence Comments: Mildly antalgic with L knee slightly kept in flexion  [facility] Adult PT Treatment:                                                DATE: 03/19/24 Therapeutic Exercise: Reviewed and update HEP discussing frequency, sets, reps, and ways to progress independently  Therapeutic Activity: Re-assessment to determine overall progress, educating patient on progress towards goals.   Self Care: Handout provided regarding posture, sleep positioning, and lifting mechanics Nutritional considerations with recommendation to discuss with PCP at upcoming visit  Reviewed activities to avoid: deep flexion and twisting on the knee.   [facility] Adult PT Treatment:  DATE: 03/12/24 Therapeutic Exercise: Recumbent bike level 2 x 5 minutes  Neuromuscular re-ed: Half kneel chops 3 lbs 2 x 10  SLS alt tap of hands to KB on gray mat table B Plank with hands on mat table with shoulder taps (a bit challenging) Plank on elbows   Therapeutic Activity: Squat to overhead press 2 x 10 @ 3 lbs  Kettlebell swings 1 x 5 @ 5 lbs and 10lbs then 2x10 @ 10lbs Sumo squat with calf raise 2 x 10 with 5# KB Deadlift 2 x 10 @ 10 lbs also did with black powercord as option for travelling Biceps curl to OH press 5#wts 2x10   [facility] Adult PT Treatment:                                                DATE: 03/05/24 Therapeutic Exercise: Recumbent bike level 2 x 5 minutes   Neuromuscular re-ed: Half kneel chops 3 lbs 2 x 10  Dead bug 2 x 10 @ 2 lbs  Therapeutic Activity: Squat to overhead press 2 x 10 @ 3 lbs  Kettlebell swings 2 x 10 @ 5 lbs  Sumo squat with calf raise 2 x 10  Deadlift 2  10 @ 5 lbs   Self Care: Discussed strength progression discussing frequency, sets, reps, resistance as part of HEP            PATIENT EDUCATION:  Education details: HEP update; D/C education  Person educated: Patient Education method: Explanation, demo, handout  Education comprehension: verbalized understanding, returned demo  HOME EXERCISE PROGRAM:  Access Code: [email or web address] URL: [email or web address] Date: 03/19/2024 Prepared by: Forrestine Ike  Exercises - Supine Lower Trunk Rotation  - 1 x daily - 7 x weekly - 1 sets - 10 reps - Supine Figure 4 Piriformis Stretch  - 1 x daily - 7 x weekly - 3 sets - 30  hold - Sidelying Thoracic Rotation with Open Book  - 1 x daily - 7 x weekly - 2 sets - 10 reps - Bird Dog  - 1 x daily - 3 x weekly - 3 sets - 10 reps - Supine Dead Bug with Leg Extension  - 1 x daily - 3 x weekly - 2 sets - 10 reps - Standard Plank  - 2 x daily - 7 x weekly - 1 sets - 5 reps - max hold - Prone Alternating Arm and Leg Lifts  - 1 x daily - 3 x weekly - 2 sets - 10 reps - Supine Chest Press with Dumbbells  - 1 x daily - 3 x weekly - 2 sets - 10 reps - The Diver  - 1 x daily - 3 x weekly - 2 sets - 10 reps - Standing Bent Over Single Arm Shoulder Row  - 1 x daily - 3 x weekly - 2 sets - 10 reps - Lunge  Matrix on Slider  - 1 x daily - 3 x weekly - 2 sets - 10 reps - Side Stepping with Resistance at Ankles  - 1 x daily - 3 x weekly - 2 sets - 10 reps - Band Walks  - 1 x daily - 3 x weekly - 2 sets - 10 reps - Standing Bicep Curls Supinated with Dumbbells  - 1 x daily - 3 x weekly - 2 sets - 10 reps -  Standing Bent Over Triceps Extension  - 1 x daily - 3 x weekly - 2 sets - 10 reps - Squat with Medicine [employer]  - 1 x daily - 3 x weekly - 2 sets - 10 reps - Kettlebell Swing  - 1 x daily - 3 x weekly - 2 sets - 10 reps - Sumo Squat with Dumbbell  - 1 x daily - 3 x weekly - 2 sets - 10 reps - Half Kneeling Diagonal Chops with Medicine Ball  - 1 x daily - 3 x weekly - 2 sets - 10 reps - Kettlebell Deadlift  - 1 x daily - 3 x weekly - 2 sets - 10 reps   ASSESSMENT:  CLINICAL IMPRESSION:  Carmelina Chinchilla has made excellent progress in PT for her Lt knee pain, low back pain, and osteoporosis. She demonstrates overall improvements in pain, strength, mobility, and body mechanics since the start of care. She has met all established functional goals and feels confident in continuing with prescribed HEP to further progress her strength independently. She is therefore appropriate for discharge at this time with patient in agreement with this plan.    RE-EVAL: Patient with new referral for age-related osteoporosis without current pathological fracture. Re-eval was performed today with patient noted to have mild gluteal weakness and aberrant lifting mechanics. Overall she has responded well in PT for her acute Lt knee pain and low back pain reporting minimal occasional pain. Today's session focused on updating HEP to include upper and lower extremity resisted strengthening exercises to focus on developing home program that she can continue to help manage her osteoporosis. She will benefit from skilled PT 1 x weekly for 4 weeks to address lingering weakness, improve body mechanics with functional tasks, and  train in home program that she can continue independently.   RE-EVAL: Re-assessment of the Lt knee and re-evaluation performed for new referral for acute bilateral low back pain without sciatica. She has progressed well in regards to her Lt knee reporting occasional mild levels of pain with full ROM and strength about the Lt knee. She has met majority of goals related to the knee, with exception of hip strength goal. We discussed continuing with HEP 2-3 x weekly for maintenance/independent progression and that we will be focusing our treatment on the acute low back pain which began at the beginning of March from repetitive coughing due to COVID. Upon assessment she is noted to have good lumbar AROM, though experiences pain with extension, rotation, and Rt lateral flexion. She has tautness about lumbar paraspinals and L-spine hypomobility with pain noted during PAIVM L2-L4. She has hip and core weakness and reports increased pain with functional bending and pushing tasks at work. She will benefit from skilled PT to address the above stated deficits in order to optimize her function and assist in overall pain reduction.    OBJECTIVE IMPAIRMENTS: Abnormal gait, decreased activity tolerance, decreased endurance, decreased mobility, difficulty walking, decreased ROM, decreased strength, increased edema, increased fascial restrictions, improper body mechanics, and pain.     GOALS: Goals reviewed with patient? Yes  SHORT TERM GOALS: Target date: 12/26/2023  Pt will be ind with initial HEP Baseline: Goal status: MET  2.  Pt will demo L = R knee ROM Baseline:  Goal status: MET   LONG TERM GOALS: Target date: 03/28/24  Pt will be ind with management and progression of HEP for full return to her normal gym activities (I.e. rowing and biking) Baseline:  02/13/24:  20 min x 4 days/week Goal status: MET   2.  Pt will report >/=75% improvement in her overall knee pain with activity Baseline:  01/22/24:  reports 75% improvement  Goal status: MET  3.  Pt will demo improved 5x STS to </=10 sec for increased functional LE strength Baseline:  Goal status: MET  4.  Pt will demo at least 4+/5 hip abductor and extensor for improved knee and lumbopelvic stability.  Baseline:  02/21/24: hip extension 4/5 (bilateral) Goal status: MET  5.  Pt will have improved LEFS to >/=56/80 to demo MCID Baseline:  Goal status: MET  6. Patient will be able to sustained bent over lunge positioning for at least 30 seconds without pain to improve tolerance to work specific activity.   Baseline: 6/10 pain with bedside work   02/13/24: no pain  Goal status: MET  7. Patient will demonstrate pain free lumbar AROM to improve ability to complete reaching and bending tasks.   Baseline: see AROM above 02/13/24: mild pain with L rotation  Goal Status: MET  8. Patient will demonstrate proper lifting mechanics of at least 15 lbs from floor to waist height to reduce stress on her back.   Baseline: see above  03/19/24: proper lifting 15 lbs   Goal status: MET  9. Patient will be independent with advanced home program as it relates to osteoporosis management to progress/maintain current level of function.   Baseline: HEP issued  Goal status: MET   PLAN:  PT FREQUENCY: 1x/week  PT DURATION: 4 weeks  PLANNED INTERVENTIONS: 97164- PT Re-evaluation, 97110-Therapeutic exercises, 97530- Therapeutic activity, [id number]- Neuromuscular re-education, 97535- Self Care, 95621- Manual therapy, [id number]- Gait training, [phone]- Aquatic Therapy, [phone]- Electrical stimulation (unattended), [phone]- Electrical stimulation (manual), [id number]- Vasopneumatic device, [id number]- Ultrasound, [id number]- Traction (mechanical), [id number]- Ionotophoresis 4mg /ml Dexamethasone , Patient/Family education, Balance training, Stair training, Taping, Dry Needling, Joint mobilization, Spinal mobilization, Cryotherapy, and Moist heat  PLAN FOR NEXT SESSION: n/a d/c   Forrestine Ike, PT, DPT, ATC 03/19/24 12:40 PM

## 2024-03-19 NOTE — Patient Instructions (Signed)

## 2024-03-20 ENCOUNTER — Other Ambulatory Visit: Payer: Self-pay

## 2024-03-26 ENCOUNTER — Encounter

## 2024-03-27 LAB — GENECONNECT MOLECULAR SCREEN: Genetic Analysis Overall Interpretation: NEGATIVE

## 2024-03-31 ENCOUNTER — Ambulatory Visit: Admitting: Family Medicine

## 2024-03-31 ENCOUNTER — Encounter: Payer: Self-pay | Admitting: Family Medicine

## 2024-03-31 VITALS — BP 124/79 | HR 65 | Temp 98.0°F | Ht 65.0 in | Wt 167.0 lb

## 2024-03-31 DIAGNOSIS — E663 Overweight: Secondary | ICD-10-CM | POA: Diagnosis not present

## 2024-03-31 DIAGNOSIS — M47816 Spondylosis without myelopathy or radiculopathy, lumbar region: Secondary | ICD-10-CM

## 2024-03-31 DIAGNOSIS — R7303 Prediabetes: Secondary | ICD-10-CM | POA: Diagnosis not present

## 2024-03-31 DIAGNOSIS — Z6827 Body mass index (BMI) 27.0-27.9, adult: Secondary | ICD-10-CM | POA: Diagnosis not present

## 2024-03-31 NOTE — Patient Instructions (Signed)
 Keep total daily calories: 1500 per day for weight loss and 1700 per day for maintenance Total daily protein goal: 80-110 g/ day  Hydrate well with water   Aim for cardio 30-45 min 5 days/ wk + weight training from home 2 days/ wk  You can increase intake of fresh veggies and fruit as you adjust into maintenance

## 2024-03-31 NOTE — Progress Notes (Signed)
 Office: [phone]  /  Fax: [phone]  WEIGHT SUMMARY AND BIOMETRICS  Starting Date: 04/16/23  Starting Weight: 188lb   Weight Lost Since Last Visit: 0lb   Vitals Temp: 98 F (36.7 C) BP: 124/79 Pulse Rate: 65 SpO2: 99 %   Body Composition  Body Fat %: 40.2 % Fat Mass (lbs): 67.2 lbs Muscle Mass (lbs): 95.2 lbs Total Body Water  (lbs): 68.6 lbs Visceral Fat Rating : 10     HPI  Chief Complaint: OBESITY  Danielle Braun is here to discuss her progress with her obesity treatment plan. She is on the the Category 2 Plan and states she is following her eating plan approximately 95 % of the time. She states she is exercising 20-40 minutes 4 times per week.   Interval History:  Since last office visit she is up 1 lb She is up 1.4 lb of muscle mass and down 0.2 lb of body fat She did ramp up her protein intake to 130-150 g/ day per Helane Lloyd FNP Satiety improved some with some mild constipation She started treatment for osteoporosis and completed PT for a vertebral compression fraction Her back pain has improved some and she is back to walking She has a net weight loss of 21 lb in 1 year of medically supervised weight management This is an 11% TBW loss She feels well at this weight She is using her rower and bike at home  Pharmacotherapy: none  PHYSICAL EXAM:  Blood pressure 124/79, pulse 65, temperature 98 F (36.7 C), height 5\' 5"  (1.651 m), weight 167 lb (75.8 kg), SpO2 99%. Body mass index is 27.79 kg/m.  General: She is healthy appearing, cooperative, alert, well developed, and in no acute distress. PSYCH: Has normal mood, affect and thought process.   Lungs: Normal breathing effort, no conversational dyspnea.  ASSESSMENT AND PLAN  TREATMENT PLAN FOR OBESITY:  Recommended Dietary Goals  Danielle Braun is currently in the action stage of change. As such, her goal is to continue weight management plan. She has agreed to keeping a food journal and  adhering to recommended goals of 1500-1700 calories and 80-110 g  protein and practicing portion control and making smarter food choices, such as increasing vegetables and decreasing simple carbohydrates. Now that her BMI is 27.7, she is entering maintenance phase of weight loss  For maintenance, keep total daily kcal 1700 per day.  Recommend switching to a mediterranean style diet for cardiovascular benefit and reducing protein to 80-110 g/ day which is 1-1.5 g of protein per kg of body weight.  Behavioral Intervention  We discussed the following Behavioral Modification Strategies today: increasing vegetables, increasing fiber rich foods, increasing water  intake , work on meal planning and preparation, keeping healthy foods at home, avoiding temptations and identifying enticing environmental cues, continue to practice mindfulness when eating, planning for success, and continue to work on maintaining a reduced calorie state, getting the recommended amount of protein, incorporating whole foods, making healthy choices, staying well hydrated and practicing mindfulness when eating..  Additional resources provided today: NA  Recommended Physical Activity Goals  Danielle Braun has been advised to work up to 150 minutes of moderate intensity aerobic activity a week and strengthening exercises 2-3 times per week for cardiovascular health, weight loss maintenance and preservation of muscle mass.   She has agreed to [name] strengthening exercises with a goal of 2-3 sessions a week  and Increase the intensity, frequency or duration of aerobic exercises   Aim for walking/rowing/ bike 30 min 5  days/ wk + resistance training from home 2-3 x a week  Pharmacotherapy changes for the treatment of obesity: none  ASSOCIATED CONDITIONS ADDRESSED TODAY  Lumbar spondylosis Back pain is improving and she is now is able to do exercise She uses Celebrex  prn pain, managed by Dr T and has completed PT Continue to work on  walking and core strengthening exercises as taught by PT  Overweight (BMI 25.0-29.9) Reviewed plan of care with patient on AVS, adjusting her targets Reviewed bioimpedence results Likely her brief loss of muscle mass was during knee and back pain where she was less active  BMI 27.0-27.9,adult  Prediabetes Lab Results  Component Value Date   HGBA1C 5.7 (H) 04/16/2023  She plans to have her A1c recheck at her upcoming PCP visit She has done well in the past year with >10% TBW loss without AOMs Continue to limit added sugar intake  She was informed of the importance of frequent follow up visits to maximize her success with intensive lifestyle modifications for her multiple health conditions.   ATTESTASTION STATEMENTS:  Reviewed by clinician on day of visit: allergies, medications, problem list, medical history, surgical history, family history, social history, and previous encounter notes pertinent to obesity diagnosis.   I have personally spent 30 minutes total time today in preparation, patient care, nutritional counseling and education,  and documentation for this visit, including the following: review of most recent clinical lab tests, prescribing medications/ refilling medications, reviewing medical assistant documentation, review and interpretation of bioimpedence results.     Micky Albee, D.O. DABFM, DABOM [facility] [address] [place], [place] [address] [phone]

## 2024-04-17 ENCOUNTER — Other Ambulatory Visit: Payer: Self-pay

## 2024-04-18 ENCOUNTER — Encounter (INDEPENDENT_AMBULATORY_CARE_PROVIDER_SITE_OTHER): Payer: Self-pay

## 2024-04-20 ENCOUNTER — Other Ambulatory Visit: Payer: Self-pay

## 2024-04-20 NOTE — Progress Notes (Signed)
 Specialty Pharmacy Refill Coordination Note  Danielle Braun is a 63 y.o. female contacted today regarding refills of specialty medication(s) Abaloparatide  (Tymlos )   Patient requested [name] at [facility] at [place] date: 04/24/24   Medication will be filled on 04/23/24.

## 2024-04-21 DIAGNOSIS — Z0000 Encounter for general adult medical examination without abnormal findings: Secondary | ICD-10-CM | POA: Diagnosis not present

## 2024-04-21 DIAGNOSIS — E559 Vitamin D deficiency, unspecified: Secondary | ICD-10-CM | POA: Diagnosis not present

## 2024-04-21 DIAGNOSIS — R7303 Prediabetes: Secondary | ICD-10-CM | POA: Diagnosis not present

## 2024-04-27 ENCOUNTER — Other Ambulatory Visit: Payer: Self-pay

## 2024-04-27 DIAGNOSIS — K219 Gastro-esophageal reflux disease without esophagitis: Secondary | ICD-10-CM | POA: Diagnosis not present

## 2024-04-27 DIAGNOSIS — R7303 Prediabetes: Secondary | ICD-10-CM | POA: Diagnosis not present

## 2024-04-27 DIAGNOSIS — E663 Overweight: Secondary | ICD-10-CM | POA: Diagnosis not present

## 2024-04-27 DIAGNOSIS — R232 Flushing: Secondary | ICD-10-CM | POA: Diagnosis not present

## 2024-04-27 DIAGNOSIS — J3089 Other allergic rhinitis: Secondary | ICD-10-CM | POA: Diagnosis not present

## 2024-04-27 DIAGNOSIS — E559 Vitamin D deficiency, unspecified: Secondary | ICD-10-CM | POA: Diagnosis not present

## 2024-04-27 DIAGNOSIS — S83207S Unspecified tear of unspecified meniscus, current injury, left knee, sequela: Secondary | ICD-10-CM | POA: Diagnosis not present

## 2024-04-27 DIAGNOSIS — Z0000 Encounter for general adult medical examination without abnormal findings: Secondary | ICD-10-CM | POA: Diagnosis not present

## 2024-04-27 DIAGNOSIS — M810 Age-related osteoporosis without current pathological fracture: Secondary | ICD-10-CM | POA: Diagnosis not present

## 2024-04-27 DIAGNOSIS — Z23 Encounter for immunization: Secondary | ICD-10-CM | POA: Diagnosis not present

## 2024-04-28 ENCOUNTER — Other Ambulatory Visit: Payer: Self-pay

## 2024-04-28 ENCOUNTER — Other Ambulatory Visit (HOSPITAL_COMMUNITY): Payer: Self-pay

## 2024-04-28 MED ORDER — VENLAFAXINE HCL ER 37.5 MG PO CP24
37.5000 mg | ORAL_CAPSULE | Freq: Every day | ORAL | 3 refills | Status: AC
  Filled 2024-04-28 – 2024-11-17 (×2): qty 90, 90d supply, fill #0

## 2024-04-28 MED ORDER — VENLAFAXINE HCL ER 37.5 MG PO CP24
37.5000 mg | ORAL_CAPSULE | Freq: Every day | ORAL | 1 refills | Status: DC
  Filled 2024-04-28 – 2024-05-22 (×2): qty 90, 90d supply, fill #0
  Filled 2024-08-20: qty 90, 90d supply, fill #1

## 2024-04-28 MED ORDER — FAMOTIDINE 40 MG PO TABS
40.0000 mg | ORAL_TABLET | Freq: Two times a day (BID) | ORAL | 3 refills | Status: AC
  Filled 2024-04-28: qty 180, 90d supply, fill #0
  Filled 2024-07-12 – 2024-07-25 (×2): qty 180, 90d supply, fill #1
  Filled 2024-10-28: qty 180, 90d supply, fill #2

## 2024-04-28 MED ORDER — FLUTICASONE PROPIONATE 50 MCG/ACT NA SUSP
2.0000 | Freq: Every day | NASAL | 1 refills | Status: AC
  Filled 2024-04-28 – 2024-11-17 (×2): qty 48, 90d supply, fill #0

## 2024-05-04 DIAGNOSIS — M810 Age-related osteoporosis without current pathological fracture: Secondary | ICD-10-CM | POA: Diagnosis not present

## 2024-05-04 DIAGNOSIS — Z780 Asymptomatic menopausal state: Secondary | ICD-10-CM | POA: Diagnosis not present

## 2024-05-12 ENCOUNTER — Encounter (INDEPENDENT_AMBULATORY_CARE_PROVIDER_SITE_OTHER): Payer: Self-pay

## 2024-05-13 ENCOUNTER — Other Ambulatory Visit (HOSPITAL_COMMUNITY): Payer: Self-pay

## 2024-05-13 ENCOUNTER — Other Ambulatory Visit: Payer: Self-pay

## 2024-05-13 NOTE — Progress Notes (Signed)
 Specialty Pharmacy Refill Coordination Note  MyChart Questionnaire Submission  Danielle Braun is a 63 y.o. female contacted today regarding refills of specialty medication(s) Tymlos .  Injection date approx: 05/29/24  Doses on hand: (Patient-Rptd) 16   Patient requested: (Patient-Rptd) Delivery   Delivery date: 05/15/24   Verified address: (Patient-Rptd) [address] [place] [address]  Medication will be filled on 05/14/24.

## 2024-05-14 ENCOUNTER — Other Ambulatory Visit: Payer: Self-pay

## 2024-05-22 ENCOUNTER — Other Ambulatory Visit (HOSPITAL_COMMUNITY): Payer: Self-pay

## 2024-05-23 ENCOUNTER — Other Ambulatory Visit (HOSPITAL_COMMUNITY): Payer: Self-pay

## 2024-05-25 DIAGNOSIS — H43813 Vitreous degeneration, bilateral: Secondary | ICD-10-CM | POA: Diagnosis not present

## 2024-06-02 ENCOUNTER — Other Ambulatory Visit: Payer: Self-pay

## 2024-06-08 ENCOUNTER — Other Ambulatory Visit (HOSPITAL_COMMUNITY): Payer: Self-pay

## 2024-06-08 ENCOUNTER — Encounter (INDEPENDENT_AMBULATORY_CARE_PROVIDER_SITE_OTHER): Payer: Self-pay

## 2024-06-08 ENCOUNTER — Other Ambulatory Visit: Payer: Self-pay

## 2024-06-08 NOTE — Progress Notes (Signed)
 Specialty Pharmacy Refill Coordination Note  MyChart Questionnaire Submission  Danielle Braun is a 63 y.o. female contacted today regarding refills of specialty medication(s) Tymlos .  Doses on hand: (Patient-Rptd) 20   Injection date: (Patient-Rptd) 06/08/24  Patient requested: (Patient-Rptd) Pickup at [facility] date: (Patient-Rptd) 06/24/24  Medication will be filled on 06/23/24.

## 2024-06-10 ENCOUNTER — Encounter: Payer: Self-pay | Admitting: Nurse Practitioner

## 2024-06-10 ENCOUNTER — Ambulatory Visit: Admitting: Nurse Practitioner

## 2024-06-10 VITALS — BP 115/76 | HR 69 | Temp 97.8°F | Ht 65.0 in | Wt 165.0 lb

## 2024-06-10 DIAGNOSIS — Z6827 Body mass index (BMI) 27.0-27.9, adult: Secondary | ICD-10-CM

## 2024-06-10 DIAGNOSIS — E663 Overweight: Secondary | ICD-10-CM | POA: Diagnosis not present

## 2024-06-10 DIAGNOSIS — E7849 Other hyperlipidemia: Secondary | ICD-10-CM | POA: Diagnosis not present

## 2024-06-10 NOTE — Progress Notes (Signed)
 Office: [phone]  /  Fax: [phone]  WEIGHT SUMMARY AND BIOMETRICS  Weight Lost Since Last Visit: 2lb  Weight Gained Since Last Visit: 0lb   Vitals Temp: 97.8 F (36.6 C) BP: 115/76 Pulse Rate: 69 SpO2: 100 %   Anthropometric Measurements Height: 5' 5 (1.651 m) Weight: 165 lb (74.8 kg) BMI (Calculated): 27.46 Weight at Last Visit: 167lb Weight Lost Since Last Visit: 2lb Weight Gained Since Last Visit: 0lb Starting Weight: 188lb Total Weight Loss (lbs): 23 lb (10.4 kg)   Body Composition  Body Fat %: 39.9 % Fat Mass (lbs): 66 lbs Muscle Mass (lbs): 94.2 lbs Total Body Water  (lbs): 67 lbs Visceral Fat Rating : 9   Other Clinical Data Fasting: No Labs: No Today's Visit #: 14 Starting Date: 04/16/23     HPI  Chief Complaint: OBESITY  Danielle Braun is here to discuss her progress with her obesity treatment plan. She is on the keeping a food journal and adhering to recommended goals of 1600 calories and 90-110 protein and states she is following her eating plan approximately 80 % of the time. She states she is exercising 40 minutes 4 days per week.   Interval History:  Since last office visit she has lost 2 pounds.  She has been on two vacations since her last visit.  She is using mynetdiary and is averaging around 1600 calories, 90-130 grams of protein and 161 carbs.  She has been eating more fruits and vegetables.  She is drinking 64 oz water  daily, coffee 2 cups daily and a mini coke zero 2 days per week.  She stopped drinking a protein shake since her last visit. She finds she has been snacking more since stopping the protein shake.  The protein shake helped her to feel full.  She is using her rower and bike 20 mins each 4 days per week.    She has an 12% TBW loss  Bioimpedance Shows a decrease in body fat%, a 1 pound decrease in muscle mass, a decrease in visceral fat rating.    Pharmacotherapy for weight loss: She is not currently taking medications   for medical weight loss.    Previous pharmacotherapy for medical weight loss:  none   Bariatric surgery:  Has not had bariatric surgery  Hyperlipidemia Medication(s): none.  FH:  Mgf (MI at 69), mgf (stroke), mother  Lab Results  Component Value Date   CHOL 190 12/04/2023   HDL 64 12/04/2023   LDLCALC 112 (H) 12/04/2023   TRIG 77 12/04/2023   Lab Results  Component Value Date   ALT 14 12/04/2023   AST 19 12/04/2023   ALKPHOS 72 12/04/2023   BILITOT <0.2 12/04/2023   The 10-year ASCVD risk score (Arnett DK, et al., 2019) is: 2.7%   Values used to calculate the score:     Age: 63 years     Clincally relevant sex: Female     Is Non-Hispanic African American: No     Diabetic: No     Tobacco smoker: No     Systolic Blood Pressure: 115 mmHg     Is BP treated: No     HDL Cholesterol: 64 mg/dL     Total Cholesterol: 195 mg/dL    PHYSICAL EXAM:  Blood pressure 115/76, pulse 69, temperature 97.8 F (36.6 C), height 5' 5 (1.651 m), weight 165 lb (74.8 kg), SpO2 100%. Body mass index is 27.46 kg/m.  General: She is overweight, cooperative, alert, well developed, and in no acute  distress. PSYCH: Has normal mood, affect and thought process.   Extremities: No edema.  Neurologic: No gross sensory or motor deficits. No tremors or fasciculations noted.    DIAGNOSTIC DATA REVIEWED:  BMET    Component Value Date/Time   NA 141 12/04/2023 0826   K 4.9 12/04/2023 0826   CL 104 12/04/2023 0826   CO2 25 12/04/2023 0826   GLUCOSE 86 12/04/2023 0826   GLUCOSE 90 10/17/2017 0920   BUN 26 12/04/2023 0826   CREATININE 0.71 12/04/2023 0826   CALCIUM 9.2 12/04/2023 0826   GFRNONAA >60 10/17/2017 0920   GFRAA >60 10/17/2017 0920   Lab Results  Component Value Date   HGBA1C 5.7 (H) 04/16/2023   Lab Results  Component Value Date   INSULIN  9.7 04/16/2023   Lab Results  Component Value Date   TSH 1.680 04/16/2023   CBC    Component Value Date/Time   WBC 5.2 12/04/2023  0826   WBC 4.3 10/17/2017 0920   RBC 4.10 12/04/2023 0826   RBC 4.26 10/17/2017 0920   HGB 12.9 12/04/2023 0826   HCT 39.6 12/04/2023 0826   PLT 272 12/04/2023 0826   MCV 97 12/04/2023 0826   MCH 31.5 12/04/2023 0826   MCH 31.7 10/17/2017 0920   MCHC 32.6 12/04/2023 0826   MCHC 33.7 10/17/2017 0920   RDW 12.5 12/04/2023 0826   Iron Studies No results found for: IRON, TIBC, FERRITIN, IRONPCTSAT Lipid Panel     Component Value Date/Time   CHOL 190 12/04/2023 0826   TRIG 77 12/04/2023 0826   HDL 64 12/04/2023 0826   LDLCALC 112 (H) 12/04/2023 0826   Hepatic Function Panel     Component Value Date/Time   PROT 6.5 12/04/2023 0826   ALBUMIN 4.1 12/04/2023 0826   AST 19 12/04/2023 0826   ALT 14 12/04/2023 0826   ALKPHOS 72 12/04/2023 0826   BILITOT <0.2 12/04/2023 0826      Component Value Date/Time   TSH 1.680 04/16/2023 0856   Nutritional Lab Results  Component Value Date   VD25OH 51.7 04/16/2023     ASSESSMENT AND PLAN  TREATMENT PLAN FOR OBESITY:  Recommended Dietary Goals  Danielle Braun has done well and is in maintenance phase.   She has agreed to keeping a food journal and adhering to recommended goals of 1700 calories and 80-110 grams of protein.  Behavioral Intervention  We discussed the following Behavioral Modification Strategies today: decreasing simple carbohydrates , increasing vegetables, increasing fiber rich foods, increasing water  intake , work on meal planning and preparation, reading food labels , and keeping healthy foods at home.  Additional resources provided today: NA  Recommended Physical Activity Goals  Danielle Braun has been advised to work up to 150 minutes of moderate intensity aerobic activity a week and strengthening exercises 2-3 times per week for cardiovascular health, weight loss maintenance and preservation of muscle mass.   She has agreed to Think about enjoyable ways to increase daily physical activity and overcoming barriers  to exercise, Increase physical activity in their day and reduce sedentary time (increase NEAT)., and continue to gradually increase the amount and intensity of exercise routine  ASSOCIATED CONDITIONS ADDRESSED TODAY  Action/Plan  Other hyperlipidemia Cardiovascular risk and specific lipid/LDL goals reviewed.  We discussed several lifestyle modifications today and Roxine will continue to work on diet, exercise and weight loss efforts. Orders and follow up as documented in patient record.   Counseling Intensive lifestyle modifications are the first line treatment for this issue.  Dietary changes: Increase soluble fiber. Decrease simple carbohydrates. Exercise changes: Moderate to vigorous-intensity aerobic activity 150 minutes per week if tolerated. Lipid-lowering medications: see documented in medical record.   Overweight (BMI 25.0-29.9)     Patient had labs obtained at PCP's office last on 04/21/24    Return in about 3 months (around 09/10/2024).SABRA She was informed of the importance of frequent follow up visits to maximize her success with intensive lifestyle modifications for her multiple health conditions.   ATTESTASTION STATEMENTS:  Reviewed by clinician on day of visit: allergies, medications, problem list, medical history, surgical history, family history, social history, and previous encounter notes.   Time spent on visit including pre-visit chart review and post-visit care and charting was 30+ minutes.    Corean SAUNDERS. Danielle Rochelle FNP-C

## 2024-06-20 ENCOUNTER — Other Ambulatory Visit (HOSPITAL_COMMUNITY): Payer: Self-pay

## 2024-06-22 DIAGNOSIS — M810 Age-related osteoporosis without current pathological fracture: Secondary | ICD-10-CM | POA: Diagnosis not present

## 2024-06-22 DIAGNOSIS — N952 Postmenopausal atrophic vaginitis: Secondary | ICD-10-CM | POA: Diagnosis not present

## 2024-06-22 DIAGNOSIS — Z6828 Body mass index (BMI) 28.0-28.9, adult: Secondary | ICD-10-CM | POA: Diagnosis not present

## 2024-06-22 DIAGNOSIS — Z01419 Encounter for gynecological examination (general) (routine) without abnormal findings: Secondary | ICD-10-CM | POA: Diagnosis not present

## 2024-06-23 ENCOUNTER — Other Ambulatory Visit: Payer: Self-pay

## 2024-06-30 ENCOUNTER — Encounter: Payer: Self-pay | Admitting: Sports Medicine

## 2024-06-30 DIAGNOSIS — Z8262 Family history of osteoporosis: Secondary | ICD-10-CM | POA: Diagnosis not present

## 2024-06-30 DIAGNOSIS — Z8781 Personal history of (healed) traumatic fracture: Secondary | ICD-10-CM | POA: Diagnosis not present

## 2024-06-30 DIAGNOSIS — M9271 Juvenile osteochondrosis of metatarsus, right foot: Secondary | ICD-10-CM | POA: Diagnosis not present

## 2024-06-30 DIAGNOSIS — Z5181 Encounter for therapeutic drug level monitoring: Secondary | ICD-10-CM | POA: Diagnosis not present

## 2024-06-30 DIAGNOSIS — M810 Age-related osteoporosis without current pathological fracture: Secondary | ICD-10-CM | POA: Diagnosis not present

## 2024-06-30 DIAGNOSIS — K219 Gastro-esophageal reflux disease without esophagitis: Secondary | ICD-10-CM | POA: Diagnosis not present

## 2024-06-30 DIAGNOSIS — R7303 Prediabetes: Secondary | ICD-10-CM | POA: Diagnosis not present

## 2024-06-30 DIAGNOSIS — E663 Overweight: Secondary | ICD-10-CM | POA: Diagnosis not present

## 2024-07-12 ENCOUNTER — Other Ambulatory Visit (INDEPENDENT_AMBULATORY_CARE_PROVIDER_SITE_OTHER): Payer: Self-pay

## 2024-07-13 ENCOUNTER — Other Ambulatory Visit (HOSPITAL_COMMUNITY): Payer: Self-pay

## 2024-07-15 ENCOUNTER — Other Ambulatory Visit (HOSPITAL_COMMUNITY): Payer: Self-pay

## 2024-07-17 ENCOUNTER — Other Ambulatory Visit (HOSPITAL_COMMUNITY): Payer: Self-pay

## 2024-07-18 ENCOUNTER — Other Ambulatory Visit (HOSPITAL_COMMUNITY): Payer: Self-pay

## 2024-07-19 ENCOUNTER — Encounter (INDEPENDENT_AMBULATORY_CARE_PROVIDER_SITE_OTHER): Payer: Self-pay

## 2024-07-20 ENCOUNTER — Other Ambulatory Visit: Payer: Self-pay

## 2024-07-20 NOTE — Progress Notes (Signed)
 Specialty Pharmacy Refill Coordination Note  Danielle Braun is a 63 y.o. female contacted today regarding refills of specialty medication(s) Abaloparatide  (Tymlos )   Patient requested (Patient-Rptd) Pickup at [facility] at [facility] date: (Patient-Rptd) 07/24/24   Medication will be filled on 07/23/24.

## 2024-07-21 ENCOUNTER — Other Ambulatory Visit: Payer: Self-pay

## 2024-07-21 ENCOUNTER — Other Ambulatory Visit (HOSPITAL_COMMUNITY): Payer: Self-pay

## 2024-07-22 ENCOUNTER — Other Ambulatory Visit: Payer: Self-pay

## 2024-07-23 ENCOUNTER — Other Ambulatory Visit (HOSPITAL_COMMUNITY): Payer: Self-pay

## 2024-07-23 ENCOUNTER — Other Ambulatory Visit (INDEPENDENT_AMBULATORY_CARE_PROVIDER_SITE_OTHER): Payer: Self-pay

## 2024-07-26 ENCOUNTER — Other Ambulatory Visit (HOSPITAL_COMMUNITY): Payer: Self-pay

## 2024-07-28 ENCOUNTER — Other Ambulatory Visit (HOSPITAL_COMMUNITY): Payer: Self-pay

## 2024-07-30 ENCOUNTER — Other Ambulatory Visit (HOSPITAL_COMMUNITY): Payer: Self-pay

## 2024-07-30 ENCOUNTER — Other Ambulatory Visit (INDEPENDENT_AMBULATORY_CARE_PROVIDER_SITE_OTHER): Payer: Self-pay

## 2024-07-31 ENCOUNTER — Telehealth (INDEPENDENT_AMBULATORY_CARE_PROVIDER_SITE_OTHER): Payer: Self-pay | Admitting: Otolaryngology

## 2024-07-31 ENCOUNTER — Other Ambulatory Visit (HOSPITAL_COMMUNITY): Payer: Self-pay

## 2024-07-31 NOTE — Telephone Encounter (Signed)
 Singular 10mg  #30 was called in with 5 refills at [facility].

## 2024-08-03 ENCOUNTER — Other Ambulatory Visit: Payer: Self-pay

## 2024-08-03 ENCOUNTER — Other Ambulatory Visit (HOSPITAL_COMMUNITY): Payer: Self-pay

## 2024-08-03 MED ORDER — MONTELUKAST SODIUM 10 MG PO TABS
10.0000 mg | ORAL_TABLET | Freq: Every day | ORAL | 5 refills | Status: DC
  Filled 2024-08-03: qty 30, 30d supply, fill #0

## 2024-08-06 ENCOUNTER — Other Ambulatory Visit (HOSPITAL_BASED_OUTPATIENT_CLINIC_OR_DEPARTMENT_OTHER): Payer: Self-pay

## 2024-08-06 MED ORDER — COMIRNATY 30 MCG/0.3ML IM SUSY
0.3000 mL | PREFILLED_SYRINGE | Freq: Once | INTRAMUSCULAR | 0 refills | Status: AC
Start: 2024-08-06 — End: 2024-08-07
  Filled 2024-08-06: qty 0.3, 1d supply, fill #0

## 2024-08-14 ENCOUNTER — Other Ambulatory Visit: Payer: Self-pay

## 2024-08-14 ENCOUNTER — Other Ambulatory Visit (HOSPITAL_COMMUNITY): Payer: Self-pay

## 2024-08-14 MED ORDER — MONTELUKAST SODIUM 10 MG PO TABS
10.0000 mg | ORAL_TABLET | Freq: Every day | ORAL | 0 refills | Status: DC
  Filled 2024-08-29: qty 90, 90d supply, fill #0

## 2024-08-14 MED ORDER — FEXOFENADINE HCL 180 MG PO TABS
180.0000 mg | ORAL_TABLET | Freq: Every day | ORAL | 0 refills | Status: AC
  Filled 2024-08-14: qty 90, 90d supply, fill #0

## 2024-08-16 ENCOUNTER — Encounter (INDEPENDENT_AMBULATORY_CARE_PROVIDER_SITE_OTHER): Payer: Self-pay

## 2024-08-17 ENCOUNTER — Other Ambulatory Visit: Payer: Self-pay

## 2024-08-17 NOTE — Progress Notes (Signed)
 Specialty Pharmacy Refill Coordination Note  Danielle Braun is a 63 y.o. female contacted today regarding refills of specialty medication(s) Abaloparatide  (Tymlos )   Patient requested [name] at [facility] [place] date: 08/24/24   Medication will be filled on 10.24.25.

## 2024-08-20 ENCOUNTER — Other Ambulatory Visit: Payer: Self-pay

## 2024-08-20 ENCOUNTER — Other Ambulatory Visit (HOSPITAL_COMMUNITY): Payer: Self-pay

## 2024-08-29 ENCOUNTER — Other Ambulatory Visit (HOSPITAL_COMMUNITY): Payer: Self-pay

## 2024-08-31 ENCOUNTER — Other Ambulatory Visit: Payer: Self-pay

## 2024-09-10 ENCOUNTER — Ambulatory Visit: Admitting: Nurse Practitioner

## 2024-09-16 ENCOUNTER — Other Ambulatory Visit (HOSPITAL_COMMUNITY): Payer: Self-pay

## 2024-09-16 ENCOUNTER — Other Ambulatory Visit: Payer: Self-pay

## 2024-09-16 NOTE — Progress Notes (Signed)
 Specialty Pharmacy Refill Coordination Note  Spoke with Danielle Braun is a 63 y.o. female contacted today regarding refills of specialty medication(s) Abaloparatide  (Tymlos )  Doses on hand: 9   Injection date: 09/25/24  Patient requested: Danielle Braun at [facility] [place] date: 09/21/24  Medication will be filled on 09/18/24 .

## 2024-09-17 ENCOUNTER — Other Ambulatory Visit: Payer: Self-pay

## 2024-09-21 ENCOUNTER — Ambulatory Visit: Admitting: Nurse Practitioner

## 2024-09-21 ENCOUNTER — Encounter: Payer: Self-pay | Admitting: Nurse Practitioner

## 2024-09-21 VITALS — BP 119/74 | HR 68 | Temp 97.9°F | Ht 65.0 in | Wt 169.0 lb

## 2024-09-21 DIAGNOSIS — E663 Overweight: Secondary | ICD-10-CM | POA: Diagnosis not present

## 2024-09-21 DIAGNOSIS — E7849 Other hyperlipidemia: Secondary | ICD-10-CM | POA: Diagnosis not present

## 2024-09-21 DIAGNOSIS — Z6828 Body mass index (BMI) 28.0-28.9, adult: Secondary | ICD-10-CM | POA: Diagnosis not present

## 2024-09-21 NOTE — Progress Notes (Signed)
 Office: [phone]  /  Fax: [phone]  WEIGHT SUMMARY AND BIOMETRICS  Weight Lost Since Last Visit: 0lb  Weight Gained Since Last Visit: 4lb   Vitals Temp: 97.9 F (36.6 C) BP: 119/74 Pulse Rate: 68 SpO2: 97 %   Anthropometric Measurements Height: 5' 5 (1.651 m) Weight: 169 lb (76.7 kg) BMI (Calculated): 28.12 Weight at Last Visit: 165lb Weight Lost Since Last Visit: 0lb Weight Gained Since Last Visit: 4lb Starting Weight: 188lb Total Weight Loss (lbs): 19 lb (8.618 kg)   Body Composition  Body Fat %: 40.6 % Fat Mass (lbs): 68.6 lbs Muscle Mass (lbs): 95.2 lbs Total Body Water  (lbs): 68.6 lbs Visceral Fat Rating : 10   Other Clinical Data Fasting: No Labs: No Today's Visit #: 15 Starting Date: 04/16/23     HPI  Chief Complaint: OBESITY  Danielle Braun is here to discuss her progress with her obesity treatment plan. She is on the keeping a food journal and adhering to recommended goals of 1700 calories and 90-110 protein and states she is following her eating plan approximately 80 % of the time. She states she is exercising 40 minutes 4 days per week.   Interval History:  Since last office visit she has gained 4 pounds.  Her weight has been averaging around 165-170 lbs.  She recently went to [facility] to help her daughter because her grandson was in ICU for RSV.  She wasn't able to follow her meal plan or exercise while she was there.   Since she has been back home, she is tracking and averaging around 1600-1700 calories, 110-120 grams of protein, 160-170 carbs and 55 fats. She is cycling 20 mins and rowing 20 mins 4 days per week.  She is doing PT/resistance training 3 days per week.    She has an appt with cardiology on 09/29/24-feels her endurance should be improving with weight loss and exercising.  She has a history of PVCs/trigeminy/V tach-near syncope-never treated with a beta blocker.  In the past year, she notes an irregular heart rate a couple times per  month.  Feels like PVCs.  BP at home 110-120 (occ 130-140)/70s.   Pharmacotherapy for weight loss: She is not currently taking medications  for medical weight loss.    Previous pharmacotherapy for medical weight loss:  none   Bariatric surgery:  Has not had bariatric surgery  Hyperlipidemia Medication(s): None.  FH: Mgf (MI at 19), mgf (stroke), mother  Has appt with cardiology on 09/29/24  Lab Results  Component Value Date   CHOL 190 12/04/2023   HDL 64 12/04/2023   LDLCALC 112 (H) 12/04/2023   TRIG 77 12/04/2023   Lab Results  Component Value Date   ALT 14 12/04/2023   AST 19 12/04/2023   ALKPHOS 72 12/04/2023   BILITOT <0.2 12/04/2023   The 10-year ASCVD risk score (Arnett DK, et al., 2019) is: 3.2%   Values used to calculate the score:     Age: 63 years     Clincally relevant sex: Female     Is Non-Hispanic African American: No     Diabetic: No     Tobacco smoker: No     Systolic Blood Pressure: 119 mmHg     Is BP treated: No     HDL Cholesterol: 64 mg/dL     Total Cholesterol: 195 mg/dL    PHYSICAL EXAM:  Blood pressure 119/74, pulse 68, temperature 97.9 F (36.6 C), height 5' 5 (1.651 m), weight 169 lb (76.7 kg),  SpO2 97%. Body mass index is 28.12 kg/m.  General: She is overweight, cooperative, alert, well developed, and in no acute distress. PSYCH: Has normal mood, affect and thought process.   Extremities: No edema.  Neurologic: No gross sensory or motor deficits. No tremors or fasciculations noted.    DIAGNOSTIC DATA REVIEWED:  BMET    Component Value Date/Time   NA 141 12/04/2023 0826   K 4.9 12/04/2023 0826   CL 104 12/04/2023 0826   CO2 25 12/04/2023 0826   GLUCOSE 86 12/04/2023 0826   GLUCOSE 90 10/17/2017 0920   BUN 26 12/04/2023 0826   CREATININE 0.71 12/04/2023 0826   CALCIUM 9.2 12/04/2023 0826   GFRNONAA >60 10/17/2017 0920   GFRAA >60 10/17/2017 0920   Lab Results  Component Value Date   HGBA1C 5.7 (H) 04/16/2023   Lab  Results  Component Value Date   INSULIN  9.7 04/16/2023   Lab Results  Component Value Date   TSH 1.680 04/16/2023   CBC    Component Value Date/Time   WBC 5.2 12/04/2023 0826   WBC 4.3 10/17/2017 0920   RBC 4.10 12/04/2023 0826   RBC 4.26 10/17/2017 0920   HGB 12.9 12/04/2023 0826   HCT 39.6 12/04/2023 0826   PLT 272 12/04/2023 0826   MCV 97 12/04/2023 0826   MCH 31.5 12/04/2023 0826   MCH 31.7 10/17/2017 0920   MCHC 32.6 12/04/2023 0826   MCHC 33.7 10/17/2017 0920   RDW 12.5 12/04/2023 0826   Iron Studies No results found for: IRON, TIBC, FERRITIN, IRONPCTSAT Lipid Panel     Component Value Date/Time   CHOL 190 12/04/2023 0826   TRIG 77 12/04/2023 0826   HDL 64 12/04/2023 0826   LDLCALC 112 (H) 12/04/2023 0826   Hepatic Function Panel     Component Value Date/Time   PROT 6.5 12/04/2023 0826   ALBUMIN 4.1 12/04/2023 0826   AST 19 12/04/2023 0826   ALT 14 12/04/2023 0826   ALKPHOS 72 12/04/2023 0826   BILITOT <0.2 12/04/2023 0826      Component Value Date/Time   TSH 1.680 04/16/2023 0856   Nutritional Lab Results  Component Value Date   VD25OH 51.7 04/16/2023     ASSESSMENT AND PLAN  TREATMENT PLAN FOR OBESITY:  Recommended Dietary Goals  Danielle Braun is currently in the action stage of change. As such, her goal is to continue weight management plan. She has agreed to practicing portion control and making smarter food choices, such as increasing vegetables and decreasing simple carbohydrates.  Behavioral Intervention  We discussed the following Behavioral Modification Strategies today: increasing lean protein intake to established goals, decreasing simple carbohydrates , increasing vegetables, increasing fiber rich foods, increasing water  intake , work on meal planning and preparation, reading food labels , keeping healthy foods at home, celebration eating strategies, continue to work on maintaining a reduced calorie state, getting the recommended  amount of protein, incorporating whole foods, making healthy choices, staying well hydrated and practicing mindfulness when eating., and increase protein intake, fibrous foods (25 grams per day for women, 30 grams for men) and water  to improve satiety and decrease hunger signals. .  Additional resources provided today: NA  Recommended Physical Activity Goals  Sharry has been advised to work up to 150 minutes of moderate intensity aerobic activity a week and strengthening exercises 2-3 times per week for cardiovascular health, weight loss maintenance and preservation of muscle mass.   She has agreed to Think about enjoyable ways to increase  daily physical activity and overcoming barriers to exercise, Increase physical activity in their day and reduce sedentary time (increase NEAT)., and Combine aerobic and strengthening exercises for efficiency and improved cardiometabolic health.    ASSOCIATED CONDITIONS ADDRESSED TODAY  Action/Plan  Other hyperlipidemia Keep appt with cardiology  Overweight (BMI 25.0-29.9)      Keep appt with cardiology also for follow up/eval for irregular heart rate. To ER for [facility], chest pain and persistent/worsening of symptoms.     Return in about 4 weeks (around 10/19/2024).SABRA She was informed of the importance of frequent follow up visits to maximize her success with intensive lifestyle modifications for her multiple health conditions.   ATTESTASTION STATEMENTS:  Reviewed by clinician on day of visit: allergies, medications, problem list, medical history, surgical history, family history, social history, and previous encounter notes.   I personally spent a total of 35 minutes in the care of the patient today including preparing to see the patient, getting/reviewing separately obtained history, counseling and educating, documenting clinical information in the EHR, and communicating results.    Corean SAUNDERS. Shirlie Enck FNP-C

## 2024-09-28 NOTE — Progress Notes (Unsigned)
 OFFICE NOTE:    Date:  09/29/2024  ID:  Danielle Braun, DOB [birth year], MRN [id number] PCP: Millicent Sharper, MD  [facility] Providers Cardiologist:  None        CAC Score 05/07/24: 0 Premature atrial contractions (PACs) Echocardiogram (10/17/2006): EF 60, Mild LAE, Normal wall motion Prediabetes Gastroesophageal reflux disease (GERD)        Discussed the use of AI scribe software for clinical note transcription with the patient, who gave verbal consent to proceed. History of Present Illness Danielle Braun is a 63 y.o. female self referred for evaluation of shortness of breath.   She has been experiencing shortness of breath and decreased exercise tolerance over the past few months, particularly noticeable when walking uphill or after exertion, such as walking from the parking deck into [place]. Despite losing over 20 pounds through a weight loss program, there has been no improvement in her symptoms. She engages in regular exercise, including stationary biking and rowing, without significant symptoms during these activities. Over the past year, she has experienced palpitations similar to symptoms she had many years ago. Her blood pressure is typically around 120/70 to 80 mmHg at home, though it was elevated during today's visit. No chest pain, arm pain, or jaw pain. She reports shoulder pain related to prior shoulder issues. She experiences occasional swelling in her legs, which improves with elevation. She denies any recent hospitalizations or long trips.   She is a former smoker with a 1.5 pack-year history from the 1980s and consumes alcohol occasionally.  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  She used to share an office with our team at [facility].  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  Family history is significant for early cardiac death in her maternal grandfather (MI in his 80s) and hypercholesterolemia and hypertension in  her mother. There are possible heart failure symptoms in her paternal grandmother.   Review of Systems  Gastrointestinal:  Negative for hematochezia and melena.  Genitourinary:  Negative for hematuria.  -See HPI     Studies Reviewed:  EKG Interpretation Date/Time:  Tuesday September 29 2024 08:08:32 EST Ventricular Rate:  65 PR Interval:  152 QRS Duration:  80 QT Interval:  394 QTC Calculation: 409 R Axis:   49  Text Interpretation: Normal sinus rhythm RSR prime V1-V2 Normal ECG No significant change since last tracing Confirmed by Lelon Hamilton [phone]) on 09/29/2024 8:14:38 AM    LABS Potassium: 4.1 (04/21/2024) Creatinine: 0.68 (04/21/2024) EGFR: >90 (04/21/2024) ALT: 12 (04/21/2024) Total cholesterol: 195 (04/21/2024) Triglycerides: 58 (04/21/2024) HDL: 64 (04/21/2024) LDL: 882 (04/21/2024) TSH: 1.804 (04/21/2024) A1c: 5.5 (04/21/2024) Hemoglobin: 13.2 (04/21/2024) Platelet count: 273000 (04/21/2024)  HYPERTENSION CONTROL Vitals:   09/29/24 0805 09/29/24 1007  BP: (!) 140/90 (!) 154/90    The patient's blood pressure is elevated above target today.  In order to address the patient's elevated BP: Blood pressure will be monitored at home to determine if medication changes need to be made.         Physical Exam:  VS:  BP (!) 154/90   Pulse 65   Ht 5' 5 (1.651 m)   Wt 173 lb 12.8 oz (78.8 kg)   SpO2 99%   BMI 28.92 kg/m        Wt Readings from Last 3 Encounters:  09/29/24 173 lb 12.8 oz (78.8 kg)  09/21/24 169 lb (76.7 kg)  06/10/24 165 lb (74.8 kg)  Constitutional:      Appearance: Healthy appearance. Not in distress.  Neck:     Vascular: No JVR. JVD normal.  Pulmonary:     Breath sounds: Normal breath sounds. No wheezing. No rales.  Cardiovascular:     Normal rate. Regular rhythm.     Murmurs: There is no murmur.  Edema:    Peripheral edema absent.  Abdominal:     Palpations: Abdomen is soft.       Assessment and Plan:    Assessment &  Plan Shortness of breath She notes symptoms of exertional dyspnea and reduced exercise tolerance over the past few months, with no chest discomfort or symptoms suggestive of angina.  She is able to exercise without symptoms.  She has a low risk factor profile with a 10-year ASCVD risk of 4.4% and a 10-year total CVD risk based on PREVENT calculator of 4.8%.  She had a calcium score of zero in July.  Given lack of chest pain, I do not think she requires ischemic evaluation at this time.  We discussed this in detail and, through shared decision making, decided to hold off on proceeding with coronary CTA.  If her symptoms progress over time I would be concerned that her shortness of breath is an anginal equivalent.  At that point, I would recommend proceeding with coronary CTA. - Order echocardiogram to rule out structural heart disease - Order BMET, BNP to rule out heart failure - Order CBC to check for anemia - Follow-up 8 weeks Elevated blood pressure reading Blood pressure elevated today.  Her blood pressure is typically well-controlled at home with readings around 120/70-80.  She is not on antihypertensive medication.  - Monitor blood pressure daily for the next 2 weeks and send readings through MyChart - Will consider starting hydrochlorothiazide if blood pressure remains elevated Palpitations Palpitations with a history of PACs and PVCs. Symptoms have been persistent but not severe.  - Monitor palpitations and consider event monitor if symptoms worsen         Dispo:  Return in about 8 weeks (around 11/24/2024) for Follow up after testing, w/ Glendia Ferrier, PA-C.  Signed, Glendia Ferrier, PA-C

## 2024-09-29 ENCOUNTER — Ambulatory Visit: Admitting: Physician Assistant

## 2024-09-29 ENCOUNTER — Encounter: Payer: Self-pay | Admitting: Physician Assistant

## 2024-09-29 ENCOUNTER — Ambulatory Visit: Attending: Physician Assistant | Admitting: Physician Assistant

## 2024-09-29 VITALS — BP 154/90 | HR 65 | Ht 65.0 in | Wt 173.8 lb

## 2024-09-29 DIAGNOSIS — R030 Elevated blood-pressure reading, without diagnosis of hypertension: Secondary | ICD-10-CM

## 2024-09-29 DIAGNOSIS — R0602 Shortness of breath: Secondary | ICD-10-CM | POA: Diagnosis not present

## 2024-09-29 DIAGNOSIS — R002 Palpitations: Secondary | ICD-10-CM | POA: Diagnosis not present

## 2024-09-29 LAB — CBC

## 2024-09-29 NOTE — Patient Instructions (Signed)
 Medication Instructions:  Your physician recommends that you continue on your current medications as directed. Please refer to the Current Medication list given to you today.  *If you need a refill on your cardiac medications before your next appointment, please call your pharmacy*  Lab Work: TODAY:  BMET, CBC, & PRO BNP   If you have labs (blood work) drawn today and your tests are completely normal, you will receive your results only by: MyChart Message (if you have MyChart) OR A paper copy in the mail If you have any lab test that is abnormal or we need to change your treatment, we will call you to review the results.  Testing/Procedures: Your physician has requested that you have an echocardiogram. Echocardiography is a painless test that uses sound waves to create images of your heart. It provides your doctor with information about the size and shape of your heart and how well your heart's chambers and valves are working. This procedure takes approximately one hour. There are no restrictions for this procedure. Please do NOT wear cologne, perfume, aftershave, or lotions (deodorant is allowed). Please arrive 15 minutes prior to your appointment time.  Please note: We ask at that you not bring children with you during ultrasound (echo/ vascular) testing. Due to room size and safety concerns, children are not allowed in the ultrasound rooms during exams. Our front office staff cannot provide observation of children in our lobby area while testing is being conducted. An adult accompanying a patient to their appointment will only be allowed in the ultrasound room at the discretion of the ultrasound technician under special circumstances. We apologize for any inconvenience.   Follow-Up: At [facility], you and your health needs are our priority.  As part of our continuing mission to provide you with exceptional heart care, our providers are all part of one team.  This team includes  your primary Cardiologist (physician) and Advanced Practice Providers or APPs (Physician Assistants and Nurse Practitioners) who all work together to provide you with the care you need, when you need it.  Your next appointment:   8 week(s)  Provider:   Glendia Ferrier, PA-C          We recommend signing up for the patient portal called MyChart.  Sign up information is provided on this After Visit Summary.  MyChart is used to connect with patients for Virtual Visits (Telemedicine).  Patients are able to view lab/test results, encounter notes, upcoming appointments, etc.  Non-urgent messages can be sent to your provider as well.   To learn more about what you can do with MyChart, go to [email or web address].   Other Instructions Your physician has requested that you regularly monitor and record your blood pressure readings at home. Please use the same machine at the same time of day to check your readings and record them to bring to your follow-up visit.   Please monitor blood pressures and keep a log of your readings for 2 weeks and send them vya mychart.   Make sure to check 2 hours after your medications.    AVOID these things for 30 minutes before checking your blood pressure: No Drinking caffeine. No Drinking alcohol. No Eating. No Smoking. No Exercising.   Five minutes before checking your blood pressure: Pee. Sit in a dining chair. Avoid sitting in a soft couch or armchair. Be quiet. Do not talk

## 2024-09-30 ENCOUNTER — Ambulatory Visit: Payer: Self-pay | Admitting: Physician Assistant

## 2024-09-30 LAB — BASIC METABOLIC PANEL WITH GFR
BUN/Creatinine Ratio: 29 — AB (ref 12–28)
BUN: 21 mg/dL (ref 8–27)
CO2: 24 mmol/L (ref 20–29)
Calcium: 9.4 mg/dL (ref 8.7–10.3)
Chloride: 101 mmol/L (ref 96–106)
Creatinine, Ser: 0.72 mg/dL (ref 0.57–1.00)
Glucose: 82 mg/dL (ref 70–99)
Potassium: 4.5 mmol/L (ref 3.5–5.2)
Sodium: 139 mmol/L (ref 134–144)
eGFR: 94 mL/min/1.73 (ref >59)

## 2024-09-30 LAB — CBC
Hematocrit: 40.3 % (ref 34.0–46.6)
Hemoglobin: 13.2 g/dL (ref 11.1–15.9)
MCH: 31.5 pg (ref 26.6–33.0)
MCHC: 32.8 g/dL (ref 31.5–35.7)
MCV: 96 fL (ref 79–97)
Platelets: 270 x10E3/uL (ref 150–450)
RBC: 4.19 x10E6/uL (ref 3.77–5.28)
RDW: 12.4 % (ref 11.7–15.4)
WBC: 4.4 x10E3/uL (ref 3.4–10.8)

## 2024-09-30 LAB — PRO B NATRIURETIC PEPTIDE: NT-Pro BNP: 40 pg/mL (ref 0–287)

## 2024-10-13 ENCOUNTER — Other Ambulatory Visit: Payer: Self-pay

## 2024-10-14 ENCOUNTER — Other Ambulatory Visit: Payer: Self-pay

## 2024-10-14 NOTE — Progress Notes (Signed)
 Specialty Pharmacy Refill Coordination Note  Danielle Braun is a 63 y.o. female contacted today regarding refills of specialty medication(s) Abaloparatide  (Tymlos )   Patient requested Delivery   Delivery date: 10/16/24   Verified address: [address] [place], [place] [address]   Medication will be filled on: 10/15/24

## 2024-10-15 ENCOUNTER — Other Ambulatory Visit: Payer: Self-pay

## 2024-10-21 ENCOUNTER — Encounter: Payer: Self-pay | Admitting: Nurse Practitioner

## 2024-10-21 ENCOUNTER — Ambulatory Visit: Admitting: Nurse Practitioner

## 2024-10-21 VITALS — BP 146/77 | HR 70 | Temp 97.6°F | Ht 65.0 in | Wt 171.0 lb

## 2024-10-21 DIAGNOSIS — R030 Elevated blood-pressure reading, without diagnosis of hypertension: Secondary | ICD-10-CM | POA: Diagnosis not present

## 2024-10-21 DIAGNOSIS — Z6828 Body mass index (BMI) 28.0-28.9, adult: Secondary | ICD-10-CM

## 2024-10-21 DIAGNOSIS — E663 Overweight: Secondary | ICD-10-CM | POA: Diagnosis not present

## 2024-10-21 NOTE — Progress Notes (Signed)
 "  Office: [phone]  /  Fax: [phone]  WEIGHT SUMMARY AND BIOMETRICS  Weight Lost Since Last Visit: 0lb  Weight Gained Since Last Visit: 2lb   Vitals Temp: 97.6 F (36.4 C) BP: (!) 146/77 Pulse Rate: 70 SpO2: 100 %   Anthropometric Measurements Height: 5' 5 (1.651 m) Weight: 171 lb (77.6 kg) BMI (Calculated): 28.46 Weight at Last Visit: 169lb Weight Lost Since Last Visit: 0lb Weight Gained Since Last Visit: 2lb Starting Weight: 188lb Total Weight Loss (lbs): 17 lb (7.711 kg)   Body Composition  Body Fat %: 41.2 % Fat Mass (lbs): 70.4 lbs Muscle Mass (lbs): 95.4 lbs Total Body Water  (lbs): 71.6 lbs Visceral Fat Rating : 10   Other Clinical Data Fasting: No Labs: No Today's Visit #: 16 Starting Date: 04/16/23     HPI  Chief Complaint: OBESITY  Danielle Braun is here to discuss her progress with her obesity treatment plan. She is on the keeping a food journal and adhering to recommended goals of 1700 calories and 90-110 protein and states she is following her eating plan approximately 85 % of the time. She states she is exercising 40 minutes 4 days per week.   Interval History:  Since last office visit she has gained 2 pounds. She is averaging around 1550-1700 calories, 130-160 carbs and 110-120 grams of protein. She is drinking water  and coffee daily.  She is cycling 20 mins and rowing 20 mins 4 days per week. She is doing PT/resistance training 3 days per week.   Pharmacotherapy for weight loss: She is not currently taking medications  for medical weight loss.    Previous pharmacotherapy for medical weight loss:  none   Bariatric surgery:  Has not had bariatric surgery  Elevated blood pressure readings.  She saw cardiology on 09/29/24.  She has been logging her BP and sent the readings to cardiology.  They had discussed about starting hydrochlorothiazide but decided to hold off at this time.  She is scheduled for an echo on 11/04/24.  She had a calcium  score of zero in July.   BP readings at home:  113-148/70-89.  Sent to cardiology to review.    BP Readings from Last 3 Encounters:  10/21/24 (!) 146/77  09/29/24 (!) 154/90  09/21/24 119/74   Lab Results  Component Value Date   CREATININE 0.72 09/29/2024   CREATININE 0.71 12/04/2023   CREATININE 0.69 04/16/2023     PHYSICAL EXAM:  Blood pressure (!) 146/77, pulse 70, temperature 97.6 F (36.4 C), height 5' 5 (1.651 m), weight 171 lb (77.6 kg), SpO2 100%. Body mass index is 28.46 kg/m.  General: She is overweight, cooperative, alert, well developed, and in no acute distress. PSYCH: Has normal mood, affect and thought process.   Extremities: No edema.  Neurologic: No gross sensory or motor deficits. No tremors or fasciculations noted.    DIAGNOSTIC DATA REVIEWED:  BMET    Component Value Date/Time   NA 139 09/29/2024 0902   K 4.5 09/29/2024 0902   CL 101 09/29/2024 0902   CO2 24 09/29/2024 0902   GLUCOSE 82 09/29/2024 0902   GLUCOSE 90 10/17/2017 0920   BUN 21 09/29/2024 0902   CREATININE 0.72 09/29/2024 0902   CALCIUM 9.4 09/29/2024 0902   GFRNONAA >60 10/17/2017 0920   GFRAA >60 10/17/2017 0920   Lab Results  Component Value Date   HGBA1C 5.7 (H) 04/16/2023   Lab Results  Component Value Date   INSULIN  9.7 04/16/2023   Lab  Results  Component Value Date   TSH 1.680 04/16/2023   CBC    Component Value Date/Time   WBC 4.4 09/29/2024 0902   WBC 4.3 10/17/2017 0920   RBC 4.19 09/29/2024 0902   RBC 4.26 10/17/2017 0920   HGB 13.2 09/29/2024 0902   HCT 40.3 09/29/2024 0902   PLT 270 09/29/2024 0902   MCV 96 09/29/2024 0902   MCH 31.5 09/29/2024 0902   MCH 31.7 10/17/2017 0920   MCHC 32.8 09/29/2024 0902   MCHC 33.7 10/17/2017 0920   RDW 12.4 09/29/2024 0902   Iron Studies No results found for: IRON, TIBC, FERRITIN, IRONPCTSAT Lipid Panel     Component Value Date/Time   CHOL 190 12/04/2023 0826   TRIG 77 12/04/2023 0826   HDL 64  12/04/2023 0826   LDLCALC 112 (H) 12/04/2023 0826   Hepatic Function Panel     Component Value Date/Time   PROT 6.5 12/04/2023 0826   ALBUMIN 4.1 12/04/2023 0826   AST 19 12/04/2023 0826   ALT 14 12/04/2023 0826   ALKPHOS 72 12/04/2023 0826   BILITOT <0.2 12/04/2023 0826      Component Value Date/Time   TSH 1.680 04/16/2023 0856   Nutritional Lab Results  Component Value Date   VD25OH 51.7 04/16/2023     ASSESSMENT AND PLAN  TREATMENT PLAN FOR OBESITY:  Recommended Dietary Goals  Danielle Braun is currently in the action stage of change. As such, her goal is to continue weight management plan. She has agreed to keeping a food journal and adhering to recommended goals of 1500-1700 calories and 90 protein.  Behavioral Intervention  We discussed the following Behavioral Modification Strategies today: increasing lean protein intake to established goals, decreasing simple carbohydrates , increasing vegetables, increasing fiber rich foods, increasing water  intake , work on meal planning and preparation, reading food labels , keeping healthy foods at home, planning for success, celebration eating strategies, continue to work on maintaining a reduced calorie state, getting the recommended amount of protein, incorporating whole foods, making healthy choices, staying well hydrated and practicing mindfulness when eating., and increase protein intake, fibrous foods (25 grams per day for women, 30 grams for men) and water  to improve satiety and decrease hunger signals. .  Additional resources provided today: NA  Recommended Physical Activity Goals  Danielle Braun has been advised to work up to 150 minutes of moderate intensity aerobic activity a week and strengthening exercises 2-3 times per week for cardiovascular health, weight loss maintenance and preservation of muscle mass.   She has agreed to Think about enjoyable ways to increase daily physical activity and overcoming barriers to exercise,  Increase physical activity in their day and reduce sedentary time (increase NEAT)., Continue to gradually increase the amount and intensity of exercise routine, Increase volume of physical activity to a goal of 240 minutes a week, and Combine aerobic and strengthening exercises for efficiency and improved cardiometabolic health.   Pharmacotherapy Avoid Phentermine and Qsymia due to HTN  ASSOCIATED CONDITIONS ADDRESSED TODAY  Action/Plan  Elevated blood pressure reading To continue to monitor BP at home.  Keep appt for echo and follow up appt with cardiology  Overweight (BMI 25.0-29.9)     Goals: To decrease carb intake Increase water  intake Increase resistance training     Return in about 6 weeks (around 12/02/2024).Danielle Braun She was informed of the importance of frequent follow up visits to maximize her success with intensive lifestyle modifications for her multiple health conditions.   ATTESTASTION STATEMENTS:  Reviewed  by clinician on day of visit: allergies, medications, problem list, medical history, surgical history, family history, social history, and previous encounter notes.   I personally spent a total of 30 minutes in the care of the patient today including preparing to see the patient, getting/reviewing separately obtained history, performing a medically appropriate exam/evaluation, counseling and educating, documenting clinical information in the EHR, independently interpreting results, and communicating results.    Corean SAUNDERS. Errika Narvaiz FNP-C "

## 2024-10-28 ENCOUNTER — Other Ambulatory Visit (HOSPITAL_COMMUNITY): Payer: Self-pay

## 2024-11-04 ENCOUNTER — Ambulatory Visit (HOSPITAL_COMMUNITY)
Admission: RE | Admit: 2024-11-04 | Discharge: 2024-11-04 | Disposition: A | Discharge status: Home / Self Care | Source: Ambulatory Visit | Attending: Internal Medicine | Admitting: Internal Medicine

## 2024-11-04 DIAGNOSIS — I517 Cardiomegaly: Secondary | ICD-10-CM | POA: Insufficient documentation

## 2024-11-04 DIAGNOSIS — R0602 Shortness of breath: Secondary | ICD-10-CM | POA: Insufficient documentation

## 2024-11-04 DIAGNOSIS — R030 Elevated blood-pressure reading, without diagnosis of hypertension: Secondary | ICD-10-CM | POA: Insufficient documentation

## 2024-11-04 DIAGNOSIS — R0600 Dyspnea, unspecified: Secondary | ICD-10-CM | POA: Diagnosis not present

## 2024-11-04 LAB — ECHOCARDIOGRAM COMPLETE
Area-P 1/2: 4.31 cm2
S' Lateral: 2.7 cm

## 2024-11-06 ENCOUNTER — Other Ambulatory Visit (HOSPITAL_COMMUNITY): Payer: Self-pay

## 2024-11-10 ENCOUNTER — Other Ambulatory Visit: Payer: Self-pay

## 2024-11-10 ENCOUNTER — Other Ambulatory Visit (HOSPITAL_COMMUNITY): Payer: Self-pay

## 2024-11-10 NOTE — Progress Notes (Signed)
 Specialty Pharmacy Refill Coordination Note  Danielle Braun is a 64 y.o. female contacted today regarding refills of specialty medication(s) Abaloparatide  (Tymlos )   Patient requested [name] at [facility] [place] date: 11/19/24   Medication will be filled on: 11/17/24

## 2024-11-10 NOTE — Progress Notes (Signed)
 Specialty Pharmacy Ongoing Clinical Assessment Note  Danielle Braun is a 64 y.o. female who is being followed by the specialty pharmacy service for RxSp Osteoporosis   Patient's specialty medication(s) reviewed today: Abaloparatide  (Tymlos )   Missed doses in the last 4 weeks: 0   Patient/Caregiver did not have any additional questions or concerns.   Therapeutic benefit summary: Unable to assess   Adverse events/side effects summary: No adverse events/side effects   Patient's therapy is appropriate to: Continue    Goals Addressed             This Visit's Progress    Maintain optimal adherence to therapy       Patient is on track. Patient will maintain adherence          Follow up: 12 months  Zuriel Yeaman M [facility] [employer] Specialty Pharmacist

## 2024-11-17 ENCOUNTER — Other Ambulatory Visit (HOSPITAL_COMMUNITY): Payer: Self-pay

## 2024-11-17 ENCOUNTER — Other Ambulatory Visit: Payer: Self-pay

## 2024-11-24 ENCOUNTER — Other Ambulatory Visit (HOSPITAL_COMMUNITY): Payer: Self-pay

## 2024-11-25 ENCOUNTER — Other Ambulatory Visit: Payer: Self-pay

## 2024-11-25 ENCOUNTER — Other Ambulatory Visit (HOSPITAL_COMMUNITY): Payer: Self-pay

## 2024-11-25 MED ORDER — MONTELUKAST SODIUM 10 MG PO TABS
10.0000 mg | ORAL_TABLET | Freq: Every day | ORAL | 1 refills | Status: AC
  Filled 2024-11-25: qty 90, 90d supply, fill #0

## 2024-11-27 ENCOUNTER — Ambulatory Visit: Admitting: Physician Assistant

## 2024-12-01 ENCOUNTER — Ambulatory Visit: Admitting: Nurse Practitioner

## 2024-12-07 ENCOUNTER — Ambulatory Visit: Admitting: Nurse Practitioner

## 2025-02-09 ENCOUNTER — Ambulatory Visit: Admitting: Physician Assistant
